# Patient Record
Sex: Female | Born: 1985 | Race: White | Hispanic: No | Smoking: Never smoker
Health system: Southern US, Community
[De-identification: ages and names within clinical notes are randomized; demographics above are authoritative.]

## PROBLEM LIST (undated history)

## (undated) ENCOUNTER — Inpatient Hospital Stay: Payer: Self-pay

## (undated) DIAGNOSIS — R51 Headache: Secondary | ICD-10-CM

## (undated) DIAGNOSIS — G43909 Migraine, unspecified, not intractable, without status migrainosus: Secondary | ICD-10-CM

## (undated) DIAGNOSIS — R519 Headache, unspecified: Secondary | ICD-10-CM

## (undated) DIAGNOSIS — Z87442 Personal history of urinary calculi: Secondary | ICD-10-CM

## (undated) DIAGNOSIS — N12 Tubulo-interstitial nephritis, not specified as acute or chronic: Secondary | ICD-10-CM

## (undated) DIAGNOSIS — N200 Calculus of kidney: Secondary | ICD-10-CM

## (undated) HISTORY — PX: COLPOSCOPY: SHX161

## (undated) HISTORY — PX: LEEP: SHX91

## (undated) HISTORY — DX: Migraine, unspecified, not intractable, without status migrainosus: G43.909

## (undated) HISTORY — DX: Tubulo-interstitial nephritis, not specified as acute or chronic: N12

---
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
## 2009-08-21 ENCOUNTER — Emergency Department: Payer: Self-pay | Admitting: Emergency Medicine

## 2010-04-13 ENCOUNTER — Observation Stay: Payer: Self-pay

## 2010-05-02 ENCOUNTER — Inpatient Hospital Stay: Payer: Self-pay | Admitting: Obstetrics and Gynecology

## 2015-11-17 LAB — HM HIV SCREENING LAB: HM HIV Screening: NEGATIVE

## 2016-03-08 LAB — HM PAP SMEAR: HM Pap smear: NEGATIVE

## 2016-04-16 ENCOUNTER — Emergency Department: Payer: Medicaid Other

## 2016-04-16 ENCOUNTER — Encounter: Payer: Self-pay | Admitting: Emergency Medicine

## 2016-04-16 ENCOUNTER — Inpatient Hospital Stay
Admission: EM | Admit: 2016-04-16 | Discharge: 2016-04-19 | DRG: 872 | Disposition: A | Discharge status: Home / Self Care | Payer: Medicaid Other | Attending: Internal Medicine | Admitting: Internal Medicine

## 2016-04-16 DIAGNOSIS — R05 Cough: Secondary | ICD-10-CM | POA: Diagnosis present

## 2016-04-16 DIAGNOSIS — N10 Acute tubulo-interstitial nephritis: Secondary | ICD-10-CM | POA: Diagnosis present

## 2016-04-16 DIAGNOSIS — B9620 Unspecified Escherichia coli [E. coli] as the cause of diseases classified elsewhere: Secondary | ICD-10-CM | POA: Diagnosis present

## 2016-04-16 DIAGNOSIS — J811 Chronic pulmonary edema: Secondary | ICD-10-CM | POA: Diagnosis present

## 2016-04-16 DIAGNOSIS — E876 Hypokalemia: Secondary | ICD-10-CM | POA: Diagnosis present

## 2016-04-16 DIAGNOSIS — A419 Sepsis, unspecified organism: Principal | ICD-10-CM | POA: Diagnosis present

## 2016-04-16 DIAGNOSIS — I959 Hypotension, unspecified: Secondary | ICD-10-CM | POA: Diagnosis present

## 2016-04-16 DIAGNOSIS — R319 Hematuria, unspecified: Secondary | ICD-10-CM | POA: Diagnosis present

## 2016-04-16 DIAGNOSIS — N390 Urinary tract infection, site not specified: Secondary | ICD-10-CM

## 2016-04-16 DIAGNOSIS — N12 Tubulo-interstitial nephritis, not specified as acute or chronic: Secondary | ICD-10-CM | POA: Diagnosis present

## 2016-04-16 DIAGNOSIS — R059 Cough, unspecified: Secondary | ICD-10-CM

## 2016-04-16 LAB — CBC WITH DIFFERENTIAL/PLATELET
BASOS ABS: 0 10*3/uL (ref 0–0.1)
Basophils Relative: 0 %
Eosinophils Absolute: 0 10*3/uL (ref 0–0.7)
Eosinophils Relative: 0 %
HEMATOCRIT: 40.6 % (ref 35.0–47.0)
Hemoglobin: 13.9 g/dL (ref 12.0–16.0)
LYMPHS PCT: 7 %
Lymphs Abs: 1.1 10*3/uL (ref 1.0–3.6)
MCH: 30 pg (ref 26.0–34.0)
MCHC: 34.2 g/dL (ref 32.0–36.0)
MCV: 87.8 fL (ref 80.0–100.0)
Monocytes Absolute: 1.4 10*3/uL — ABNORMAL HIGH (ref 0.2–0.9)
Monocytes Relative: 8 %
NEUTROS ABS: 14.4 10*3/uL — AB (ref 1.4–6.5)
NEUTROS PCT: 85 %
PLATELETS: 230 10*3/uL (ref 150–440)
RBC: 4.63 MIL/uL (ref 3.80–5.20)
RDW: 12.3 % (ref 11.5–14.5)
WBC: 17 10*3/uL — AB (ref 3.6–11.0)

## 2016-04-16 LAB — URINALYSIS COMPLETE WITH MICROSCOPIC (ARMC ONLY)
Bilirubin Urine: NEGATIVE
Glucose, UA: NEGATIVE mg/dL
KETONES UR: NEGATIVE mg/dL
Nitrite: NEGATIVE
PH: 5 (ref 5.0–8.0)
PROTEIN: 100 mg/dL — AB
Specific Gravity, Urine: 1.021 (ref 1.005–1.030)

## 2016-04-16 LAB — HCG, QUANTITATIVE, PREGNANCY: hCG, Beta Chain, Quant, S: 2 m[IU]/mL (ref <5)

## 2016-04-16 LAB — COMPREHENSIVE METABOLIC PANEL
ALT: 13 U/L — AB (ref 14–54)
ANION GAP: 9 (ref 5–15)
AST: 15 U/L (ref 15–41)
Albumin: 3.9 g/dL (ref 3.5–5.0)
Alkaline Phosphatase: 73 U/L (ref 38–126)
BUN: 9 mg/dL (ref 6–20)
CHLORIDE: 102 mmol/L (ref 101–111)
CO2: 23 mmol/L (ref 22–32)
Calcium: 8.9 mg/dL (ref 8.9–10.3)
Creatinine, Ser: 0.92 mg/dL (ref 0.44–1.00)
GFR calc non Af Amer: >60 mL/min (ref >60)
Glucose, Bld: 123 mg/dL — ABNORMAL HIGH (ref 65–99)
Potassium: 3 mmol/L — ABNORMAL LOW (ref 3.5–5.1)
SODIUM: 134 mmol/L — AB (ref 135–145)
Total Bilirubin: 1.2 mg/dL (ref 0.3–1.2)
Total Protein: 8 g/dL (ref 6.5–8.1)

## 2016-04-16 LAB — LACTIC ACID, PLASMA: LACTIC ACID, VENOUS: 1.4 mmol/L (ref 0.5–1.9)

## 2016-04-16 MED ORDER — MAGNESIUM CITRATE PO SOLN: 1.0000 | Freq: Once | ORAL | Status: DC | PRN

## 2016-04-16 MED ORDER — ACETAMINOPHEN 650 MG RE SUPP
650.0000 mg | Freq: Four times a day (QID) | RECTAL | Status: DC | PRN
  Administered 2016-04-16: 650 mg via RECTAL
  Filled 2016-04-16: qty 1

## 2016-04-16 MED ORDER — HYDROCODONE-ACETAMINOPHEN 5-325 MG PO TABS: 1.0000 | ORAL_TABLET | ORAL | Status: DC | PRN

## 2016-04-16 MED ORDER — RIZATRIPTAN BENZOATE 10 MG PO TABS: 10.0000 mg | ORAL_TABLET | ORAL | Status: DC | PRN

## 2016-04-16 MED ORDER — DEXTROSE 5 % IV SOLN
2.0000 g | INTRAVENOUS | Status: DC
  Administered 2016-04-17 – 2016-04-18 (×2): 2 g via INTRAVENOUS
  Filled 2016-04-16 (×3): qty 2

## 2016-04-16 MED ORDER — ACETAMINOPHEN 325 MG PO TABS
650.0000 mg | ORAL_TABLET | Freq: Four times a day (QID) | ORAL | Status: DC | PRN
  Filled 2016-04-16: qty 2

## 2016-04-16 MED ORDER — SODIUM CHLORIDE 0.9 % IV SOLN
INTRAVENOUS | Status: DC
  Administered 2016-04-16 – 2016-04-18 (×3): via INTRAVENOUS

## 2016-04-16 MED ORDER — SODIUM CHLORIDE 0.9 % IV BOLUS (SEPSIS)
1000.0000 mL | Freq: Once | INTRAVENOUS | Status: AC
  Administered 2016-04-16: 1000 mL via INTRAVENOUS

## 2016-04-16 MED ORDER — ONDANSETRON HCL 4 MG/2ML IJ SOLN
INTRAMUSCULAR | Status: AC
Start: 2016-04-16 — End: 2016-04-16
  Administered 2016-04-16: 4 mg via INTRAVENOUS
  Filled 2016-04-16: qty 2

## 2016-04-16 MED ORDER — POTASSIUM CHLORIDE 20 MEQ PO PACK
40.0000 meq | PACK | Freq: Two times a day (BID) | ORAL | Status: AC
  Filled 2016-04-16: qty 2

## 2016-04-16 MED ORDER — VANCOMYCIN HCL IN DEXTROSE 1-5 GM/200ML-% IV SOLN: 1000.0000 mg | Freq: Two times a day (BID) | INTRAVENOUS | Status: DC

## 2016-04-16 MED ORDER — ONDANSETRON HCL 4 MG PO TABS: 4.0000 mg | ORAL_TABLET | Freq: Four times a day (QID) | ORAL | Status: DC | PRN

## 2016-04-16 MED ORDER — PIPERACILLIN-TAZOBACTAM 3.375 G IVPB: 3.3750 g | Freq: Three times a day (TID) | INTRAVENOUS | Status: DC

## 2016-04-16 MED ORDER — MORPHINE SULFATE (PF) 2 MG/ML IV SOLN
INTRAVENOUS | Status: AC
  Administered 2016-04-16: 2 mg via INTRAVENOUS
  Filled 2016-04-16: qty 1

## 2016-04-16 MED ORDER — PIPERACILLIN-TAZOBACTAM 3.375 G IVPB 30 MIN
3.3750 g | Freq: Once | INTRAVENOUS | Status: AC
  Administered 2016-04-16: 3.375 g via INTRAVENOUS
  Filled 2016-04-16: qty 50

## 2016-04-16 MED ORDER — ONDANSETRON HCL 4 MG/2ML IJ SOLN
4.0000 mg | Freq: Four times a day (QID) | INTRAMUSCULAR | Status: DC | PRN
  Administered 2016-04-17 – 2016-04-19 (×4): 4 mg via INTRAVENOUS
  Filled 2016-04-16 (×4): qty 2

## 2016-04-16 MED ORDER — MORPHINE SULFATE (PF) 2 MG/ML IV SOLN
2.0000 mg | Freq: Once | INTRAVENOUS | Status: AC
  Administered 2016-04-16: 2 mg via INTRAVENOUS

## 2016-04-16 MED ORDER — SENNOSIDES-DOCUSATE SODIUM 8.6-50 MG PO TABS: 1.0000 | ORAL_TABLET | Freq: Every evening | ORAL | Status: DC | PRN

## 2016-04-16 MED ORDER — RIZATRIPTAN BENZOATE 10 MG PO TBDP: 10.0000 mg | ORAL_TABLET | ORAL | Status: DC | PRN

## 2016-04-16 MED ORDER — BISACODYL 5 MG PO TBEC: 5.0000 mg | DELAYED_RELEASE_TABLET | Freq: Every day | ORAL | Status: DC | PRN

## 2016-04-16 MED ORDER — DEXTROSE 5 % IV SOLN
2.0000 g | Freq: Once | INTRAVENOUS | Status: AC
  Administered 2016-04-17: 2 g via INTRAVENOUS
  Filled 2016-04-16: qty 2

## 2016-04-16 MED ORDER — VANCOMYCIN HCL IN DEXTROSE 1-5 GM/200ML-% IV SOLN
1000.0000 mg | Freq: Once | INTRAVENOUS | Status: AC
  Administered 2016-04-16: 1000 mg via INTRAVENOUS
  Filled 2016-04-16: qty 200

## 2016-04-16 MED ORDER — ONDANSETRON HCL 4 MG/2ML IJ SOLN
4.0000 mg | Freq: Once | INTRAMUSCULAR | Status: AC
  Administered 2016-04-16: 4 mg via INTRAVENOUS

## 2016-04-16 NOTE — ED Triage Notes (Signed)
Pt presents with low back pain, fever, bodyaches and headache for two days.

## 2016-04-16 NOTE — ED Notes (Signed)
Attempted reports x1.  

## 2016-04-16 NOTE — H&P (Signed)
[facility] [place] [facility] Admission History and Physical [employer] Alaisa Moffitt, D.O.  ---------------------------------------------------------------------------------------------------------------------   PATIENT NAME: Laura Sosa MR#: [id number] DATE OF BIRTH: 06/05/1986 DATE OF ADMISSION: 04/16/2016 PRIMARY CARE PHYSICIAN: Pcp Not In System  REQUESTING/REFERRING PHYSICIAN: ED Dr. Shaune PollackLord  CHIEF COMPLAINT: Chief Complaint  Patient presents with  . bodyaches  . Fever  . Emesis  . Back Pain    HISTORY OF PRESENT ILLNESS: Laura Sosa is a 30 y.o. female with No known medical history was in a usual state of health until 3 days ago when she developed fevers, chills, body aches and right sided mid to low back pain. She reports that for the last 2-3 days she has had vomiting for which she received an intramuscular injection of Phenergan earlier in the day without any resolution of her symptoms. She came to the emergency department for evaluation secondary to worsening low back pain, decreased by mouth intake, body aches, fever and back pain. She denies any abdominal pain or dysuria. She did have one episode of hematuria in the emergency department today.  Otherwise there has been no change in status. Patient has been taking medication as prescribed and there has been no recent change in medication or diet.  There has been no recent illness, travel or sick contacts.    Patient denies fevers/chills, weakness, dizziness, chest pain, shortness of breath, N/V/C/D, abdominal pain, dysuria/frequency, changes in mental status.   EMS/ED COURSE:   Patient received Zosyn, Vanco and IV normal saline  PAST MEDICAL HISTORY: History reviewed. No pertinent past medical history.    PAST SURGICAL HISTORY: History reviewed. No pertinent surgical history.    SOCIAL HISTORY: Social History  Substance Use Topics  . Smoking status: Never Smoker  . Smokeless tobacco: Never Used  .  Alcohol use No   Patient is currently in school for dental hygiene   FAMILY HISTORY: No family history on file.   MEDICATIONS AT HOME: Prior to Admission medications   Medication Sig Start Date End Date Taking? Authorizing Provider  rizatriptan (MAXALT-MLT) 10 MG disintegrating tablet Take 10 mg by mouth as needed for migraine. May repeat in 2 hours if needed   Yes Historical Provider, MD      DRUG ALLERGIES: No Known Allergies   REVIEW OF SYSTEMS: CONSTITUTIONAL: Positive fever/chills, fatigue, negative weakness, weight gain/loss, headache EYES: No blurry or double vision. ENT: No tinnitus, postnasal drip, redness or soreness of the oropharynx. RESPIRATORY: No cough, wheeze, hemoptysis, dyspnea. CARDIOVASCULAR: No chest pain, orthopnea, palpitations, syncope. GASTROINTESTINAL: No nausea, vomiting, constipation, diarrhea, abdominal pain, hematemesis, melena or hematochezia. GENITOURINARY: Negative dysuria positive hematuria. ENDOCRINE: No polyuria or nocturia. No heat or cold intolerance. HEMATOLOGY: No anemia, bruising, bleeding. INTEGUMENTARY: No rashes, ulcers, lesions. MUSCULOSKELETAL: No arthritis, swelling, gout. NEUROLOGIC: No numbness, tingling, weakness or ataxia. No seizure-type activity. PSYCHIATRIC: No anxiety, depression, insomnia.  PHYSICAL EXAMINATION: VITAL SIGNS: Blood pressure 103/86, pulse 98, temperature (!) 102.5 F (39.2 C), temperature source Oral, resp. rate 18, height 5\' 3"  (1.6 m), weight 65.8 kg (145 lb), last menstrual period 04/09/2016, SpO2 98 %, unknown if currently breastfeeding.  GENERAL: 30 y.o.-year-old white female patient, well-developed, well-nourished lying in the bed in no acute distress.  Pleasant and cooperative.   HEENT: Head atraumatic, normocephalic. Pupils equal, round, reactive to light and accommodation. No scleral icterus. Extraocular muscles intact. Nares are patent. Oropharynx is clear. Mucus membranes moist. NECK: Supple,  full range of motion. No JVD, no bruit heard. No thyroid enlargement, no tenderness,  no cervical lymphadenopathy. CHEST: Normal breath sounds bilaterally. No wheezing, rales, rhonchi or crackles. No use of accessory muscles of respiration.  No reproducible chest wall tenderness.  CARDIOVASCULAR: S1, S2 normal. No murmurs, rubs, or gallops. Cap refill <2 seconds. ABDOMEN: Soft, nontender, nondistended. No rebound, guarding, rigidity. Normoactive bowel sounds present in all four quadrants. No organomegaly or mass. GU positive right-sided CVA tenderness:  EXTREMITIES: Full range of motion. No pedal edema, cyanosis, or clubbing. NEUROLOGIC: Cranial nerves II through XII are grossly intact with no focal sensorimotor deficit. Muscle strength 5/5 in all extremities. Sensation intact. Gait not checked. PSYCHIATRIC: The patient is alert and oriented x 3. Normal affect, mood, thought content. SKIN: Warm, dry, and intact without obvious rash, lesion, or ulcer.  LABORATORY PANEL:  CBC  Recent Labs Lab 04/16/16 1851  WBC 17.0*  HGB 13.9  HCT 40.6  PLT 230   ----------------------------------------------------------------------------------------------------------------- Chemistries  Recent Labs Lab 04/16/16 1851  NA 134*  K 3.0*  CL 102  CO2 23  GLUCOSE 123*  BUN 9  CREATININE 0.92  CALCIUM 8.9  AST 15  ALT 13*  ALKPHOS 73  BILITOT 1.2   ------------------------------------------------------------------------------------------------------------------ Cardiac Enzymes No results for input(s): TROPONINI in the last 168 hours. ------------------------------------------------------------------------------------------------------------------  RADIOLOGY: Dg Chest 2 View  Result Date: 04/16/2016 CLINICAL DATA:  Fever, vomiting, nausea, chest pain, cough, myalgias, chills. EXAM: CHEST  2 VIEW COMPARISON:  None. FINDINGS: Normal sized heart. Clear lungs. Mild central peribronchial  thickening. Mild scoliosis. IMPRESSION: Mild bronchitic changes. Electronically Signed   By: Beckie SaltsSteven  Reid M.D.   On: 04/16/2016 20:30    EKG: Sinus tachycardia at 103 bpm with a normal axis and no significant ST or T-wave changes.   IMPRESSION AND PLAN:  This is a 30 y.o. female with no known past medical history  now being admitted with: 1. Sepsis secondary to urinary tract infection suspect pyelonephritis-we will admit to med-surg for IV fluids, IV antibiotics which we will narrow to Rocephin, urine cultures, pain control and antiemetics. We will request a renal ultrasound to evaluate for stones given her hematuria and right-sided back pain. 2. Hypokalemia-Will replace by mouth.   Diet/Nutrition: Regular, clear liquids if nausea persistsds:  Fluids: IV normal saline  DVT Px: SCDs and early ambulation Code Status: Full  All the records are reviewed and case discussed with ED provider. Management plans discussed with the patient and/or family who express understanding and agree with plan of care.   TOTAL TIME TAKING CARE OF THIS PATIENT: 60 minutes.   Finlee Milo D.O. on 04/16/2016 at 10:53 PM Between 7am to 6pm - Pager - [phone] After 6pm go to [email or web address] - Social research officer, governmentpassword EPAS [facility] [place] [facility] Office [phone] CC: Primary care physician; Pcp Not In System     Note: This dictation was prepared with Dragon dictation along with smaller phrase technology. Any transcriptional errors that result from this process are unintentional.

## 2016-04-16 NOTE — ED Provider Notes (Signed)
[facility] Emergency Department Provider Note ____________________________________________   I have reviewed the triage vital signs and the triage nursing note.  HISTORY  Chief Complaint bodyaches; Fever; Emesis; and Back Pain   Historian Patient  HPI Laura Sosa is a 30 y.o. female who is here for evaluation of fever and body aches and back pain. Symptoms started 2-3 days ago and have gotten worse over the weekend. Denies dysuria. Denies abdominal pain. She has had multiple episodes of nonbloody nonbilious emesis. Her mom works at a physician's office and she received intramuscular injection of Phenergan earlier today with some resolution of nausea. No skin rashes. No spinal injections or epidural, etc. No confusion altered mental status or neck stiffness. No coughing or trouble breathing. No chest pain.  Body aches and back painrated at 8 or 9 out of 10.    History reviewed. No pertinent past medical history.  There are no active problems to display for this patient.   History reviewed. No pertinent surgical history.  Prior to Admission medications   Medication Sig Start Date End Date Taking? Authorizing Provider  rizatriptan (MAXALT-MLT) 10 MG disintegrating tablet Take 10 mg by mouth as needed for migraine. May repeat in 2 hours if needed   Yes Historical Provider, MD    No Known Allergies  No family history on file.  Social History Social History  Substance Use Topics  . Smoking status: Never Smoker  . Smokeless tobacco: Never Used  . Alcohol use No    Review of Systems  Constitutional: Positive for fever. Eyes: Negative for visual changes. ENT: Negative for sore throat. Cardiovascular: Negative for chest pain. Respiratory: Negative for shortness of breath. Gastrointestinal: Negative for abdominal pain, vomiting and diarrhea. Genitourinary: Negative for dysuria. Denies pelvic pain, or vaginal discharge. Musculoskeletal:  Positive for low back pain.  Skin: Negative for rash. Neurological: Negative for headache. 10 point Review of Systems otherwise negative ____________________________________________   PHYSICAL EXAM:  VITAL SIGNS: ED Triage Vitals  Enc Vitals Group     BP 04/16/16 1822 118/73     Pulse Rate 04/16/16 1822 (!) 122     Resp 04/16/16 1822 19     Temp 04/16/16 1822 (!) 102.3 F (39.1 C)     Temp Source 04/16/16 1822 Oral     SpO2 04/16/16 1822 98 %     Weight 04/16/16 1821 145 lb (65.8 kg)     Height 04/16/16 1821 5\' 3"  (1.6 m)     Head Circumference --      Peak Flow --      Pain Score 04/16/16 1829 10     Pain Loc --      Pain Edu? --      Excl. in GC? --      Constitutional: Alert and oriented. Looks like she doesn't feel very well, but in no acute distress. HEENT   Head: Normocephalic and atraumatic.      Eyes: Conjunctivae are normal. PERRL. Normal extraocular movements.      Ears:         Nose: No congestion/rhinnorhea.   Mouth/Throat: Mucous membranes are mildly dry.   Neck: No stridor. Cardiovascular/Chest: Tachycardic, regular rhythm.  No murmurs, rubs, or gallops. Respiratory: Normal respiratory effort without tachypnea nor retractions. Breath sounds are clear and equal bilaterally. No wheezes/rales/rhonchi. Gastrointestinal: Soft. No distention, no guarding, no rebound. Nontender.    Genitourinary/rectal:Deferred Musculoskeletal: Nontender with normal range of motion in all extremities. No joint effusions.  No lower  extremity tenderness.  No edema. Neurologic:  Normal speech and language. No gross or focal neurologic deficits are appreciated. Skin:  Skin is warm, dry and intact. No rash noted. Psychiatric: Mood and affect are normal. Speech and behavior are normal. Patient exhibits appropriate insight and judgment.  ____________________________________________   EKG I, Governor Rooksebecca Manjot Beumer, MD, the attending physician have personally viewed and interpreted all  ECGs.  103 bpm. Sinus tachycardia. Narrow QRS. Normal axis. Normal ST and T-wave. ____________________________________________  LABS (pertinent positives/negatives)  Labs Reviewed  COMPREHENSIVE METABOLIC PANEL - Abnormal; Notable for the following:       Result Value   Sodium 134 (*)    Potassium 3.0 (*)    Glucose, Bld 123 (*)    ALT 13 (*)    All other components within normal limits  CBC WITH DIFFERENTIAL/PLATELET - Abnormal; Notable for the following:    WBC 17.0 (*)    Neutro Abs 14.4 (*)    Monocytes Absolute 1.4 (*)    All other components within normal limits  URINALYSIS COMPLETEWITH MICROSCOPIC ([facility] ONLY) - Abnormal; Notable for the following:    Color, Urine AMBER (*)    APPearance CLOUDY (*)    Hgb urine dipstick 2+ (*)    Protein, ur 100 (*)    Leukocytes, UA 1+ (*)    Bacteria, UA MANY (*)    Squamous Epithelial / LPF 6-30 (*)    All other components within normal limits  CULTURE, BLOOD (ROUTINE X 2)  CULTURE, BLOOD (ROUTINE X 2)  URINE CULTURE  LACTIC ACID, PLASMA  HCG, QUANTITATIVE, PREGNANCY  LACTIC ACID, PLASMA    ____________________________________________  RADIOLOGY All Xrays were viewed by me. Imaging interpreted by Radiologist.  Chest x-ray: Mild bronchitic changes __________________________________________  PROCEDURES  Procedure(s) performed: None  Critical Care performed: CRITICAL CARE Performed by: Governor RooksLORD, Khadeja Abt   Total critical care time: 30 minutes  Critical care time was exclusive of separately billable procedures and treating other patients.  Critical care was necessary to treat or prevent imminent or life-threatening deterioration.  Critical care was time spent personally by me on the following activities: development of treatment plan with patient and/or surrogate as well as nursing, discussions with consultants, evaluation of patient's response to treatment, examination of patient, obtaining history from patient or  surrogate, ordering and performing treatments and interventions, ordering and review of laboratory studies, ordering and review of radiographic studies, pulse oximetry and re-evaluation of patient's condition.   ____________________________________________   ED COURSE / ASSESSMENT AND PLAN  Pertinent labs & imaging results that were available during my care of the patient were reviewed by me and considered in my medical decision making (see chart for details).   Laura Sosa is here with fever and tachycardia and body aches concerning for possible sepsis. Code sepsis was initiated. Unclear source based on history and physical exam, the patient's complaint is low back pain.  I am going to initiate antibiotics ankle mice and Zosyn for undifferentiated sepsis prior to laboratory studies returned.  White blood cell count 17,000. Patient is slightly hypokalemic and was given repletion. Her urinalysis is consistent with urinary tract infection and given symptoms, I think pyelonephritis is the source of her symptoms and sepsis.  Discussed with the hospitalist for admission.  CONSULTATIONS:   Dr. Clint GuyHower, hospitalist for admission.   Patient / Family / Caregiver informed of clinical course, medical decision-making process, and agree with plan.    ___________________________________________   FINAL CLINICAL IMPRESSION(S) / ED DIAGNOSES  Final diagnoses:  Sepsis, unspecified organism [facility](HCC)  Pyelonephritis              Note: This dictation was prepared with Dragon dictation. Any transcriptional errors that result from this process are unintentional    Governor Rooksebecca Christe Tellez, MD 04/16/16 2039

## 2016-04-16 NOTE — Progress Notes (Addendum)
Pharmacy Antibiotic Note  Laura Sosa is a 30 y.o. female admitted on 04/16/2016 with sepsis.  Pharmacy has been consulted for Vancomycin/Zosyn dosing.  Plan: Vancomycin 1000mg   IV every 12 hours.  Goal trough 15-20 mcg/mL. Zosyn 3.375g IV q8h (4 hour infusion). Stacked dose 6 hr from ED dose. Will order a vancomycin trough prior to the fifth dose. Continue to monitor CrCl.   Ke=0.067 Vd=46.06 T1/2= 10.34 Estimated trough= 18.96   Height: 5\' 3"  (160 cm) Weight: 145 lb (65.8 kg) IBW/kg (Calculated) : 52.4  Temp (24hrs), Avg:103 F (39.4 C), Min:102.3 F (39.1 C), Max:103.7 F (39.8 C)  No results for input(s): WBC, CREATININE, LATICACIDVEN, VANCOTROUGH, VANCOPEAK, VANCORANDOM, GENTTROUGH, GENTPEAK, GENTRANDOM, TOBRATROUGH, TOBRAPEAK, TOBRARND, AMIKACINPEAK, AMIKACINTROU, AMIKACIN in the last 168 hours.  CrCl cannot be calculated (No order found.).    No Known Allergies  Antimicrobials this admission: Vancomycin 8/21 >> x1 Zosyn 8/21 >> Ceftriaxone   Microbiology results: 8/21 BCx:  8/21 UCx:    Changed to ceftriaxone per admitting MD note. Consults for Zosyn and vancomycin d/c.  Thank you for allowing pharmacy to be a part of this patient's care.  Delsa BernKelly m Fuhrmann 04/16/2016 7:08 PM

## 2016-04-16 NOTE — ED Notes (Signed)
Pt had childrens tyenol around 1pm today.

## 2016-04-17 ENCOUNTER — Observation Stay: Payer: Medicaid Other

## 2016-04-17 DIAGNOSIS — N12 Tubulo-interstitial nephritis, not specified as acute or chronic: Secondary | ICD-10-CM

## 2016-04-17 DIAGNOSIS — E876 Hypokalemia: Secondary | ICD-10-CM | POA: Diagnosis present

## 2016-04-17 DIAGNOSIS — N10 Acute tubulo-interstitial nephritis: Secondary | ICD-10-CM | POA: Diagnosis present

## 2016-04-17 DIAGNOSIS — R05 Cough: Secondary | ICD-10-CM | POA: Diagnosis present

## 2016-04-17 DIAGNOSIS — J811 Chronic pulmonary edema: Secondary | ICD-10-CM | POA: Diagnosis present

## 2016-04-17 DIAGNOSIS — A419 Sepsis, unspecified organism: Secondary | ICD-10-CM | POA: Diagnosis present

## 2016-04-17 DIAGNOSIS — I959 Hypotension, unspecified: Secondary | ICD-10-CM | POA: Diagnosis present

## 2016-04-17 DIAGNOSIS — R319 Hematuria, unspecified: Secondary | ICD-10-CM | POA: Diagnosis present

## 2016-04-17 DIAGNOSIS — B9620 Unspecified Escherichia coli [E. coli] as the cause of diseases classified elsewhere: Secondary | ICD-10-CM | POA: Diagnosis present

## 2016-04-17 HISTORY — DX: Tubulo-interstitial nephritis, not specified as acute or chronic: N12

## 2016-04-17 LAB — CBC
HEMATOCRIT: 33.9 % — AB (ref 35.0–47.0)
Hemoglobin: 12 g/dL (ref 12.0–16.0)
MCH: 30.8 pg (ref 26.0–34.0)
MCHC: 35.3 g/dL (ref 32.0–36.0)
MCV: 87.4 fL (ref 80.0–100.0)
Platelets: 188 10*3/uL (ref 150–440)
RBC: 3.88 MIL/uL (ref 3.80–5.20)
RDW: 12.4 % (ref 11.5–14.5)
WBC: 12.1 10*3/uL — AB (ref 3.6–11.0)

## 2016-04-17 LAB — BASIC METABOLIC PANEL
Anion gap: 7 (ref 5–15)
BUN: 8 mg/dL (ref 6–20)
CHLORIDE: 109 mmol/L (ref 101–111)
CO2: 20 mmol/L — AB (ref 22–32)
Calcium: 7.7 mg/dL — ABNORMAL LOW (ref 8.9–10.3)
Creatinine, Ser: 0.86 mg/dL (ref 0.44–1.00)
GFR calc Af Amer: >60 mL/min (ref >60)
GFR calc non Af Amer: >60 mL/min (ref >60)
GLUCOSE: 160 mg/dL — AB (ref 65–99)
POTASSIUM: 3.1 mmol/L — AB (ref 3.5–5.1)
Sodium: 136 mmol/L (ref 135–145)

## 2016-04-17 LAB — MAGNESIUM: Magnesium: 1.6 mg/dL — ABNORMAL LOW (ref 1.7–2.4)

## 2016-04-17 MED ORDER — SODIUM CHLORIDE 0.9 % IV BOLUS (SEPSIS)
1000.0000 mL | Freq: Once | INTRAVENOUS | Status: AC
  Administered 2016-04-17: 1000 mL via INTRAVENOUS

## 2016-04-17 MED ORDER — HYDROCODONE-ACETAMINOPHEN 7.5-325 MG/15ML PO SOLN
10.0000 mL | ORAL | Status: DC | PRN
  Administered 2016-04-17 (×2): 10 mL via ORAL
  Filled 2016-04-17 (×2): qty 15

## 2016-04-17 MED ORDER — POTASSIUM CHLORIDE 20 MEQ PO PACK
40.0000 meq | PACK | Freq: Once | ORAL | Status: AC
  Administered 2016-04-17: 40 meq via ORAL
  Filled 2016-04-17: qty 2

## 2016-04-17 MED ORDER — HYDROCODONE-ACETAMINOPHEN 7.5-325 MG/15ML PO SOLN
15.0000 mL | ORAL | Status: DC | PRN
  Administered 2016-04-17 – 2016-04-19 (×10): 15 mL via ORAL
  Filled 2016-04-17 (×10): qty 15

## 2016-04-17 MED ORDER — ZOLPIDEM TARTRATE 5 MG PO TABS
5.0000 mg | ORAL_TABLET | Freq: Every evening | ORAL | Status: DC | PRN
  Administered 2016-04-17: 5 mg via ORAL
  Filled 2016-04-17: qty 1

## 2016-04-17 NOTE — Progress Notes (Signed)
Pt requested and received pain medication at 1830 for pain rated 7/10.

## 2016-04-17 NOTE — Progress Notes (Signed)
Pharmacy Antibiotic Note  Laura Sosa is a 30 y.o. female admitted on 04/16/2016 with UTI.  Pharmacy has been consulted for ceftriaxone dosing.  Plan: Ceftriaxone 2 g IV q24h  Height: 5\' 3"  (160 cm) Weight: 149 lb 6.4 oz (67.8 kg) IBW/kg (Calculated) : 52.4  Temp (24hrs), Avg:100.5 F (38.1 C), Min:98.1 F (36.7 C), Max:103.7 F (39.8 C)   Recent Labs Lab 04/16/16 1851 04/16/16 1852 04/17/16 0439  WBC 17.0*  --  12.1*  CREATININE 0.92  --  0.86  LATICACIDVEN  --  1.4  --     Estimated Creatinine Clearance: 90.9 mL/min (by C-G formula based on SCr of 0.86 mg/dL).    No Known Allergies  Antimicrobials this admission: ceftriaxone 8/21 >>  Vancomycin and zosyn one dose in ED 8/21  Dose adjustments this admission:  Microbiology results: 8/21 BCx: Sent 8/21 UCx: Sent    Thank you for allowing pharmacy to be a part of this patient's care.  Cindi CarbonMary M Grabiela Wohlford, PharmD, BCPS 04/17/2016 10:37 AM

## 2016-04-17 NOTE — Progress Notes (Signed)
[facility] [place] [facility]   PATIENT NAME: Laura Sosa    MR#:  [id number]  DATE OF BIRTH:  03/12/1986  SUBJECTIVE:  CHIEF COMPLAINT:   Chief Complaint  Patient presents with  . bodyaches  . Fever  . Emesis  . Back Pain   Continues to be febrile and hypotensive. Hematuria is mildly improved. Febrile. Continues to have back pain.  REVIEW OF SYSTEMS:    Review of Systems  Constitutional: Positive for chills, fever and malaise/fatigue.  HENT: Negative for sore throat.   Eyes: Negative for blurred vision, double vision and pain.  Respiratory: Negative for cough, hemoptysis, shortness of breath and wheezing.   Cardiovascular: Negative for chest pain, palpitations, orthopnea and leg swelling.  Gastrointestinal: Negative for abdominal pain, constipation, diarrhea, heartburn, nausea and vomiting.  Genitourinary: Positive for hematuria and urgency. Negative for dysuria.  Musculoskeletal: Positive for back pain. Negative for joint pain.  Skin: Negative for rash.  Neurological: Positive for weakness. Negative for sensory change, speech change, focal weakness and headaches.  Endo/Heme/Allergies: Does not bruise/bleed easily.  Psychiatric/Behavioral: Negative for depression. The patient is not nervous/anxious.     DRUG ALLERGIES:  No Known Allergies  VITALS:  Blood pressure (!) 96/59, pulse 76, temperature 98.7 F (37.1 C), temperature source Oral, resp. rate 19, height 5\' 3"  (1.6 m), weight 67.8 kg (149 lb 6.4 oz), last menstrual period 04/09/2016, SpO2 100 %, unknown if currently breastfeeding.  PHYSICAL EXAMINATION:   Physical Exam  GENERAL:  30 y.o.-year-old patient lying in the bed with no acute distress.  EYES: Pupils equal, round, reactive to light and accommodation. No scleral icterus. Extraocular muscles intact.  HEENT: Head atraumatic, normocephalic. Oropharynx and nasopharynx clear.  NECK:  Supple, no jugular venous distention.  No thyroid enlargement, no tenderness.  LUNGS: Normal breath sounds bilaterally, no wheezing, rales, rhonchi. No use of accessory muscles of respiration.  CARDIOVASCULAR: S1, S2 normal. No murmurs, rubs, or gallops.  ABDOMEN: Soft, nontender, nondistended. Bowel sounds present. No organomegaly or mass.  Bilateral CVA tenderness EXTREMITIES: No cyanosis, clubbing or edema b/l.    NEUROLOGIC: Cranial nerves II through XII are intact. No focal Motor or sensory deficits b/l.   PSYCHIATRIC: The patient is alert and oriented x 3.  SKIN: No obvious rash, lesion, or ulcer.   LABORATORY PANEL:   CBC  Recent Labs Lab 04/17/16 0439  WBC 12.1*  HGB 12.0  HCT 33.9*  PLT 188   ------------------------------------------------------------------------------------------------------------------ Chemistries   Recent Labs Lab 04/16/16 1851 04/17/16 0439  NA 134* 136  K 3.0* 3.1*  CL 102 109  CO2 23 20*  GLUCOSE 123* 160*  BUN 9 8  CREATININE 0.92 0.86  CALCIUM 8.9 7.7*  AST 15  --   ALT 13*  --   ALKPHOS 73  --   BILITOT 1.2  --    ------------------------------------------------------------------------------------------------------------------  Cardiac Enzymes No results for input(s): TROPONINI in the last 168 hours. ------------------------------------------------------------------------------------------------------------------  RADIOLOGY:  Dg Chest 2 View  Result Date: 04/16/2016 CLINICAL DATA:  Fever, vomiting, nausea, chest pain, cough, myalgias, chills. EXAM: CHEST  2 VIEW COMPARISON:  None. FINDINGS: Normal sized heart. Clear lungs. Mild central peribronchial thickening. Mild scoliosis. IMPRESSION: Mild bronchitic changes. Electronically Signed   By: Beckie SaltsSteven  Reid M.D.   On: 04/16/2016 20:30   [place] Renal  Result Date: 04/17/2016 CLINICAL DATA:  UTI and sepsis EXAM: RENAL / URINARY TRACT ULTRASOUND COMPLETE COMPARISON:  None. FINDINGS: Right Kidney: Length: 11.6 cm. Echogenicity  within normal limits. No mass or hydronephrosis visualized. Left Kidney: Length: 11.0 cm. Echogenicity within normal limits. No mass or hydronephrosis visualized. Bladder: Appears normal for degree of bladder distention. IMPRESSION: No acute abnormality noted. Electronically Signed   By: Alcide CleverMark  Lukens M.D.   On: 04/17/2016 09:30     ASSESSMENT AND PLAN:   * Acute pyelonephritis with sepsis Patient continues to be hypotensive and febrile. We will bolus 1 L normal saline and continue maintenance fluids. Repeat blood pressure after fluids. Continue ceftriaxone. Blood and urine cultures pending. Renal ultrasound showed no hydronephrosis or obstruction.  * Hypokalemia Replace orally  * DVT prophylaxis with SCDs No heparin or Lovenox due to significant hematuria  All the records are reviewed and case discussed with Care Management/Social Workerr. Management plans discussed with the patient, family and they are in agreement.  CODE STATUS: FULL CODE  DVT Prophylaxis: SCDs  TOTAL CC TIME TAKING CARE OF THIS PATIENT: 35 minutes.   POSSIBLE D/C IN 2-3 DAYS, DEPENDING ON CLINICAL CONDITION.  Milagros LollSudini, Latosha Gaylord R M.D on 04/17/2016 at 12:10 PM  Between 7am to 6pm - Pager - [phone]  After 6pm go to [email or web address] - password [email or web address]  [facility]  Office  [phone]  CC: Primary care physician; Pcp Not In System  Note: This dictation was prepared with Dragon dictation along with smaller phrase technology. Any transcriptional errors that result from this process are unintentional.

## 2016-04-17 NOTE — Plan of Care (Signed)
Problem: Bowel/Gastric: Goal: Will not experience complications related to bowel motility Outcome: Progressing Pt is resting quietly in bed unless needing to to void, is independent with ambulation. Pain control has been attended to, and testing has been completed, cultures pending. Dr. Elpidio AnisSudini rounded on pt, pt is aware of plan to await cultures, continue ivf and antibiotics.

## 2016-04-17 NOTE — Progress Notes (Signed)
Shift assessment completed at 0730. Pt is awake, alert and oriented, stated that she is in pain to her back. Pt is on room air, lungs are clear bilat, monitor in place, Sr noted. Abdomen is soft, bs heard. Pt denied difficulty voiding,ppp, no edema noted. PIV #20 intact to lac, #22 intact to RAC with iv NS infusing at 14350mls/hr, both sites are free of redness and swelling. Skin appears intact. Directly after assessment completed, pt rang call bell ot c/o severe pain, pt moaning while this writer was in the room. This writer explained pain med could not be given until at 0830 due to time interval ordered, pt then stated she was nauseous and asked for med for this. Pt received zofran iv. Pt then asked for a sleeping pill tonight because "i haven't slept in 4 days". This Clinical research associatewriter explained that she could apeak to practitioner about this but that  She may not need  One by then. Pt received pain med at 0825. Pt has been to ultrasound fro testing and has returned to the unit.

## 2016-04-17 NOTE — Progress Notes (Signed)
Pt requested and received scd's at 1300. At 1430, pt requested and received med for pain to her back 10/10. Pt has had multiple family members at bedside since this am, tolerated some food for lunch. Iv bolus has nearly finished, pt's bp slowly improving, pt is stating that overall, she feels better.

## 2016-04-17 NOTE — Care Management Note (Signed)
Case Management Note  Patient Details  Name: Laura Sosa MRN: [id number] Date of Birth: 07/14/1986  Subjective/Objective:     Spoke with patient and family at the bedside.   Patient is A and O from home with family and independent. No CM needs identified.             Action/Plan:Home with self care.  Expected Discharge Date:                  Expected Discharge Plan:  Home/Self Care  In-House Referral:     Discharge planning Services  CM Consult  Post Acute Care Choice:    Choice offered to:     DME Arranged:    DME Agency:     HH Arranged:    HH Agency:     Status of Service:  Completed, signed off  If discussed at [employer] Length of Stay Meetings, dates discussed:    Additional Comments:  Adonis HugueninBerkhead, Melvin Whiteford L, RN 04/17/2016, 1:56 PM

## 2016-04-18 ENCOUNTER — Inpatient Hospital Stay: Payer: Medicaid Other

## 2016-04-18 LAB — BASIC METABOLIC PANEL
Anion gap: 5 (ref 5–15)
CHLORIDE: 111 mmol/L (ref 101–111)
CO2: 22 mmol/L (ref 22–32)
CREATININE: 0.71 mg/dL (ref 0.44–1.00)
Calcium: 8 mg/dL — ABNORMAL LOW (ref 8.9–10.3)
GFR calc Af Amer: >60 mL/min (ref >60)
Glucose, Bld: 94 mg/dL (ref 65–99)
Potassium: 3.2 mmol/L — ABNORMAL LOW (ref 3.5–5.1)
SODIUM: 138 mmol/L (ref 135–145)

## 2016-04-18 LAB — MAGNESIUM: MAGNESIUM: 1.7 mg/dL (ref 1.7–2.4)

## 2016-04-18 LAB — CBC WITH DIFFERENTIAL/PLATELET
BASOS ABS: 0.1 10*3/uL (ref 0–0.1)
BASOS PCT: 1 %
EOS ABS: 0.1 10*3/uL (ref 0–0.7)
Eosinophils Relative: 1 %
HCT: 32.4 % — ABNORMAL LOW (ref 35.0–47.0)
HEMOGLOBIN: 11.3 g/dL — AB (ref 12.0–16.0)
LYMPHS ABS: 2.2 10*3/uL (ref 1.0–3.6)
Lymphocytes Relative: 20 %
MCH: 30.9 pg (ref 26.0–34.0)
MCHC: 34.8 g/dL (ref 32.0–36.0)
MCV: 88.6 fL (ref 80.0–100.0)
Monocytes Absolute: 0.9 10*3/uL (ref 0.2–0.9)
Monocytes Relative: 8 %
NEUTROS PCT: 70 %
Neutro Abs: 7.9 10*3/uL — ABNORMAL HIGH (ref 1.4–6.5)
Platelets: 191 10*3/uL (ref 150–440)
RBC: 3.65 MIL/uL — AB (ref 3.80–5.20)
RDW: 12.5 % (ref 11.5–14.5)
WBC: 11.1 10*3/uL — AB (ref 3.6–11.0)

## 2016-04-18 MED ORDER — POTASSIUM CHLORIDE 20 MEQ PO PACK: 40.0000 meq | PACK | ORAL | Status: AC

## 2016-04-18 MED ORDER — FUROSEMIDE 10 MG/ML IJ SOLN
20.0000 mg | Freq: Once | INTRAMUSCULAR | Status: AC
  Administered 2016-04-18: 20 mg via INTRAVENOUS
  Filled 2016-04-18: qty 2

## 2016-04-18 MED ORDER — FUROSEMIDE 40 MG PO TABS: 40.0000 mg | ORAL_TABLET | Freq: Once | ORAL | Status: DC

## 2016-04-18 NOTE — Plan of Care (Signed)
Problem: Bowel/Gastric: Goal: Will not experience complications related to bowel motility Outcome: Progressing Pt has remains free on injury/falls this shift, is progressing with goals toward d/c. Pt has been cooperative with care, is anticipating d/c tomorrow.

## 2016-04-18 NOTE — Progress Notes (Signed)
Pt awake on rounds, stated that she has urinated x3 since receiving iv lasix. Measuring hat is replaced to commode. Pt in no distress.

## 2016-04-18 NOTE — Progress Notes (Addendum)
[facility] [place] [facility]   PATIENT NAME: Laura Sosa    MR#:  [id number]  DATE OF BIRTH:  08/03/1986  SUBJECTIVE:  CHIEF COMPLAINT:   Chief Complaint  Patient presents with  . bodyaches  . Fever  . Emesis  . Back Pain   Hematuria has resolved Afebrile today Continues to have back pain. Complaining of cough  REVIEW OF SYSTEMS:    Review of Systems  Constitutional: Positive for chills, fever and malaise/fatigue.  HENT: Negative for sore throat.   Eyes: Negative for blurred vision, double vision and pain.  Respiratory: Negative for cough, hemoptysis, shortness of breath and wheezing.   Cardiovascular: Negative for chest pain, palpitations, orthopnea and leg swelling.  Gastrointestinal: Negative for abdominal pain, constipation, diarrhea, heartburn, nausea and vomiting.  Genitourinary: Positive for hematuria and urgency. Negative for dysuria.  Musculoskeletal: Positive for back pain. Negative for joint pain.  Skin: Negative for rash.  Neurological: Positive for weakness. Negative for sensory change, speech change, focal weakness and headaches.  Endo/Heme/Allergies: Does not bruise/bleed easily.  Psychiatric/Behavioral: Negative for depression. The patient is not nervous/anxious.     DRUG ALLERGIES:  No Known Allergies  VITALS:  Blood pressure 111/66, pulse 81, temperature 97.7 F (36.5 C), temperature source Oral, resp. rate 16, height 5\' 3"  (1.6 m), weight 67.8 kg (149 lb 6.4 oz), last menstrual period 04/09/2016, SpO2 94 %, unknown if currently breastfeeding.  PHYSICAL EXAMINATION:   Physical Exam  GENERAL:  30 y.o.-year-old patient lying in the bed with no acute distress.  EYES: Pupils equal, round, reactive to light and accommodation. No scleral icterus. Extraocular muscles intact.  HEENT: Head atraumatic, normocephalic. Oropharynx and nasopharynx clear.  NECK:  Supple, no jugular venous distention. No thyroid enlargement,  no tenderness.  LUNGS: Normal work of breathing. Basal crackles. CARDIOVASCULAR: S1, S2 normal. No murmurs, rubs, or gallops.  ABDOMEN: Soft, nontender, nondistended. Bowel sounds present. No organomegaly or mass.  Bilateral CVA tenderness EXTREMITIES: No cyanosis, clubbing or edema b/l.    NEUROLOGIC: Cranial nerves II through XII are intact. No focal Motor or sensory deficits b/l.   PSYCHIATRIC: The patient is alert and oriented x 3.  SKIN: No obvious rash, lesion, or ulcer.   LABORATORY PANEL:   CBC  Recent Labs Lab 04/18/16 0457  WBC 11.1*  HGB 11.3*  HCT 32.4*  PLT 191   ------------------------------------------------------------------------------------------------------------------ Chemistries   Recent Labs Lab 04/16/16 1851  04/18/16 0457  NA 134*  < > 138  K 3.0*  < > 3.2*  CL 102  < > 111  CO2 23  < > 22  GLUCOSE 123*  < > 94  BUN 9  < > <5*  CREATININE 0.92  < > 0.71  CALCIUM 8.9  < > 8.0*  MG  --   < > 1.7  AST 15  --   --   ALT 13*  --   --   ALKPHOS 73  --   --   BILITOT 1.2  --   --   < > = values in this interval not displayed. ------------------------------------------------------------------------------------------------------------------  Cardiac Enzymes No results for input(s): TROPONINI in the last 168 hours. ------------------------------------------------------------------------------------------------------------------  RADIOLOGY:  Dg Chest 2 View  Result Date: 04/18/2016 CLINICAL DATA:  Cough EXAM: CHEST  2 VIEW COMPARISON:  04/16/2016 FINDINGS: Cardiac shadow is stable. The lungs are well aerated bilaterally. Mild bibasilar infiltrate is seen right greater than left with associated effusion. No bony abnormality is noted. IMPRESSION:  Increasing bibasilar infiltrates. Electronically Signed   By: Alcide CleverMark  Lukens M.D.   On: 04/18/2016 11:25   Dg Chest 2 View  Result Date: 04/16/2016 CLINICAL DATA:  Fever, vomiting, nausea, chest pain, cough,  myalgias, chills. EXAM: CHEST  2 VIEW COMPARISON:  None. FINDINGS: Normal sized heart. Clear lungs. Mild central peribronchial thickening. Mild scoliosis. IMPRESSION: Mild bronchitic changes. Electronically Signed   By: Beckie SaltsSteven  Reid M.D.   On: 04/16/2016 20:30   [place] Renal  Result Date: 04/17/2016 CLINICAL DATA:  UTI and sepsis EXAM: RENAL / URINARY TRACT ULTRASOUND COMPLETE COMPARISON:  None. FINDINGS: Right Kidney: Length: 11.6 cm. Echogenicity within normal limits. No mass or hydronephrosis visualized. Left Kidney: Length: 11.0 cm. Echogenicity within normal limits. No mass or hydronephrosis visualized. Bladder: Appears normal for degree of bladder distention. IMPRESSION: No acute abnormality noted. Electronically Signed   By: Alcide CleverMark  Lukens M.D.   On: 04/17/2016 09:30     ASSESSMENT AND PLAN:   * Pulmonary edema on chest x-ray Due to fluid resuscitation for sepsis Stop IV fluids. Give 1 dose of oral Lasix.  * Acute pyelonephritis with sepsis - GNR in urine Improving Continue ceftriaxone.  Wait for final cultures Renal ultrasound showed no hydronephrosis or obstruction.  * Hypokalemia Replaced orally  * DVT prophylaxis with SCDs No heparin or Lovenox due to significant hematuria  All the records are reviewed and case discussed with Care Management/Social Workerr. Management plans discussed with the patient, family and they are in agreement.  CODE STATUS: FULL CODE  DVT Prophylaxis: SCDs  TOTAL CC TIME TAKING CARE OF THIS PATIENT: 35 minutes.   POSSIBLE D/C IN 2-3 DAYS, DEPENDING ON CLINICAL CONDITION.  Milagros LollSudini, Tamani Durney R M.D on 04/18/2016 at 12:11 PM  Between 7am to 6pm - Pager - [phone]  After 6pm go to [email or web address] - password [email or web address]  [facility] [place] [facility]  Office  [phone]  CC: Primary care physician; Pcp Not In System  Note: This dictation was prepared with Dragon dictation along with smaller phrase technology. Any transcriptional errors that result  from this process are unintentional.

## 2016-04-18 NOTE — Progress Notes (Signed)
Shift assessment was completed at 0745, see flowsheet. Pt alert and oriented, c/o pain to her back rated 7/10. PIV#22 intact to Rac with ivf NS infusign at 12350mls/hr, site to this arm and saline lock to Lac both free of redness and swelling.  Pt received pain medication as ordered, Dr. Elpidio AnisSudini in on rounds at 0910. Pt subsequently had a chest x ray ordered and completed. Since that time, IVF has been dc'd per MD order. Pt temp and throat checked at 1140 as pt was very upset and crying because her iv pump had been alarming and would not stop. Pt temp at that time was 98.4 and pt's throat was free of redness, discoloration, exudate, etc. Pt c/o sore throat and headache at that time, pt was offered med ordered for headache and she refused it, stating that she is unable to swallow pills. Pt was given [place] for her throat.

## 2016-04-18 NOTE — Progress Notes (Signed)
Pt had stated on rounds at 1600 that she had begun feeling dizzy when walking to the bathroom.  This Clinical research associatewriter asked her at that time to ring call bell for assisst, for the puprpose of checking orthostatic bps as well since pt reported resolution of sx with rest in bed. Pt has received zofran for reported reflux and just received pain med as well. Urine is light yellow and clear. Pt stated that she walked to the bathroom with assist from visitor and that she felt symptoms were improving. This Clinical research associatewriter again asked pt to call staff for assist when needed.

## 2016-04-19 LAB — URINE CULTURE

## 2016-04-19 MED ORDER — HYDROCODONE-ACETAMINOPHEN 7.5-325 MG/15ML PO SOLN: 10.0000 mL | Freq: Four times a day (QID) | ORAL | 0 refills | Status: DC | PRN

## 2016-04-19 MED ORDER — CIPROFLOXACIN 250 MG/5ML (5%) PO SUSR: 500.0000 mg | Freq: Two times a day (BID) | ORAL | 0 refills | Status: DC

## 2016-04-19 MED ORDER — ONDANSETRON 4 MG PO TBDP
4.0000 mg | ORAL_TABLET | Freq: Three times a day (TID) | ORAL | 0 refills | Status: DC | PRN
Start: 2016-04-19 — End: 2017-02-24

## 2016-04-19 NOTE — Discharge Instructions (Signed)

## 2016-04-19 NOTE — Progress Notes (Signed)
Pt being discharged home today. Discharge instructions reviewed with with pt and all questions answered. Educated on all medications being continued including pain, nausea, and antibiotic. Instructed to take entire course of antibiotics. She will follow up with her PCP in 1-2 weeks. She is leaving with all her belongings, will be transported home via family.

## 2016-04-19 NOTE — Progress Notes (Signed)
[facility]         [facility] [place]   04/19/2016  Patient: Laura Sosa Bhandari   Date of Birth:  06/25/1986  Date of admission:  04/16/2016  Date of Discharge  04/19/2016    To Whom it May Concern:   Laura Sosa Rinks  may return to work on 04/23/2016.   If you have any questions or concerns, please don't hesitate to call.  Sincerely,   Milagros LollSudini, Espn Zeman R M.D Office : [phone]   .

## 2016-04-21 LAB — CULTURE, BLOOD (ROUTINE X 2)
CULTURE: NO GROWTH
CULTURE: NO GROWTH

## 2016-04-21 NOTE — Discharge Summary (Signed)
[facility] [place] [facility]   PATIENT NAME: Laura Sosa    MR#:  [id number]  DATE OF BIRTH:  07/08/1986  DATE OF ADMISSION:  04/16/2016 ADMITTING PHYSICIAN: Jon GillsAlexis Hugelmeyer, DO  DATE OF DISCHARGE: 04/19/2016  1:05 PM  PRIMARY CARE PHYSICIAN: Pcp Not In System   ADMISSION DIAGNOSIS:  Pyelonephritis [N12] Sepsis secondary to UTI (HCC) [A41.9, N39.0] Sepsis, unspecified organism (HCC) [A41.9]  DISCHARGE DIAGNOSIS:  Active Problems:   Sepsis secondary to UTI (HCC)   Pyelonephritis   SECONDARY DIAGNOSIS:  History reviewed. No pertinent past medical history.   ADMITTING HISTORY  HISTORY OF PRESENT ILLNESS: Laura Sosa is a 30 y.o. female with No known medical history was in a usual state of health until 3 days ago when she developed fevers, chills, body aches and right sided mid to low back pain. She reports that for the last 2-3 days she has had vomiting for which she received an intramuscular injection of Phenergan earlier in the day without any resolution of her symptoms. She came to the emergency department for evaluation secondary to worsening low back pain, decreased by mouth intake, body aches, fever and back pain. She denies any abdominal pain or dysuria. She did have one episode of hematuria in the emergency department today.  Otherwise there has been no change in status. Patient has been taking medication as prescribed and there has been no recent change in medication or diet.  There has been no recent illness, travel or sick contacts.    Patient denies fevers/chills, weakness, dizziness, chest pain, shortness of breath, N/V/C/D, abdominal pain, dysuria/frequency, changes in mental status.    HOSPITAL COURSE:   * Acute pyelonephritis with sepsis - Escherichia coli which is pansensitive Afebrile. Normal WBC by day of discharge. IV ceftriaxone during hospitalization. Switched to oral ciprofloxacin at discharge. Renal  ultrasound showed no hydronephrosis or obstruction. The patient's given for antibiotics, nausea medication and pain medications. Follow-up with primary care physician in one week.  * Pulmonary edema on chest x-ray With some cough and crackles on exam on day 2 of admission Due to fluid resuscitation for sepsis IV fluids were stopped. One dose of Lasix given. Symptoms resolved.  * Hypokalemia Replaced orally  * DVT prophylaxis with SCDs during hospitalization No heparin or Lovenox due to significant hematuria  Patient is stable for discharge home.  CONSULTS OBTAINED:    DRUG ALLERGIES:  No Known Allergies  DISCHARGE MEDICATIONS:   Discharge Medication List as of 04/19/2016 11:13 AM    START taking these medications   Details  ciprofloxacin (CIPRO) 250 MG/5ML (5%) SUSR Take 10 mLs (500 mg total) by mouth 2 (two) times daily., Starting Thu 04/19/2016, Print    HYDROcodone-acetaminophen (HYCET) 7.5-325 mg/15 ml solution Take 10 mLs by mouth every 6 (six) hours as needed for severe pain., Starting Thu 04/19/2016, Print    ondansetron (ZOFRAN ODT) 4 MG disintegrating tablet Take 1 tablet (4 mg total) by mouth every 8 (eight) hours as needed for nausea or vomiting., Starting Thu 04/19/2016, Print      CONTINUE these medications which have NOT CHANGED   Details  rizatriptan (MAXALT-MLT) 10 MG disintegrating tablet Take 10 mg by mouth as needed for migraine. May repeat in 2 hours if needed, Historical Med        Today   VITAL SIGNS:  Blood pressure 105/70, pulse (!) 57, temperature 98.3 F (36.8 C), temperature source Oral, resp. rate 18, height 5\' 3"  (1.6 m), weight 67.8  kg (149 lb 6.4 oz), last menstrual period 04/09/2016, SpO2 100 %, unknown if currently breastfeeding.  I/O:  No intake or output data in the 24 hours ending 04/21/16 1644  PHYSICAL EXAMINATION:  Physical Exam  GENERAL:  30 y.o.-year-old patient lying in the bed with no acute distress.  LUNGS: Normal breath  sounds bilaterally, no wheezing, rales,rhonchi or crepitation. No use of accessory muscles of respiration.  CARDIOVASCULAR: S1, S2 normal. No murmurs, rubs, or gallops.  ABDOMEN: Soft, non-tender, non-distended. Bowel sounds present. No organomegaly or mass.  NEUROLOGIC: Moves all 4 extremities. PSYCHIATRIC: The patient is alert and oriented x 3.  SKIN: No obvious rash, lesion, or ulcer.   DATA REVIEW:   CBC  Recent Labs Lab 04/18/16 0457  WBC 11.1*  HGB 11.3*  HCT 32.4*  PLT 191    Chemistries   Recent Labs Lab 04/16/16 1851  04/18/16 0457  NA 134*  < > 138  K 3.0*  < > 3.2*  CL 102  < > 111  CO2 23  < > 22  GLUCOSE 123*  < > 94  BUN 9  < > <5*  CREATININE 0.92  < > 0.71  CALCIUM 8.9  < > 8.0*  MG  --   < > 1.7  AST 15  --   --   ALT 13*  --   --   ALKPHOS 73  --   --   BILITOT 1.2  --   --   < > = values in this interval not displayed.  Cardiac Enzymes No results for input(s): TROPONINI in the last 168 hours.  Microbiology Results  Results for orders placed or performed during the hospital encounter of 04/16/16  Culture, blood (Routine x 2)     Status: None   Collection Time: 04/16/16  6:52 PM  Result Value Ref Range Status   Specimen Description BLOOD LAC  Final   Special Requests BOTTLES DRAWN AEROBIC AND ANAEROBIC  Final   Culture NO GROWTH 5 DAYS  Final   Report Status 04/21/2016 FINAL  Final  Culture, blood (Routine x 2)     Status: None   Collection Time: 04/16/16  6:53 PM  Result Value Ref Range Status   Specimen Description BLOOD  Final   Special Requests NONE  Final   Culture NO GROWTH 5 DAYS  Final   Report Status 04/21/2016 FINAL  Final  Urine culture     Status: Abnormal   Collection Time: 04/16/16  6:53 PM  Result Value Ref Range Status   Specimen Description URINE, CLEAN CATCH URINE, CLEAN CATCH  Final   Special Requests NONE  Final   Culture >=100,000 COLONIES/mL ESCHERICHIA COLI (A)  Final   Report Status 04/19/2016 FINAL  Final    Organism ID, Bacteria ESCHERICHIA COLI (A)  Final      Susceptibility   Escherichia coli - MIC*    AMPICILLIN <=2 SENSITIVE Sensitive     CEFAZOLIN <=4 SENSITIVE Sensitive     CEFTRIAXONE <=1 SENSITIVE Sensitive     CIPROFLOXACIN <=0.25 SENSITIVE Sensitive     GENTAMICIN <=1 SENSITIVE Sensitive     IMIPENEM <=0.25 SENSITIVE Sensitive     NITROFURANTOIN <=16 SENSITIVE Sensitive     TRIMETH/SULFA <=20 SENSITIVE Sensitive     AMPICILLIN/SULBACTAM <=2 SENSITIVE Sensitive     PIP/TAZO <=4 SENSITIVE Sensitive     Extended ESBL NEGATIVE Sensitive     * >=100,000 COLONIES/mL ESCHERICHIA COLI    RADIOLOGY:  No results  found.  Follow up with PCP in 1 week.  Management plans discussed with the patient, family and they are in agreement.  CODE STATUS:  Code Status History    Date Active Date Inactive Code Status Order ID Comments User Context   04/16/2016 11:04 PM 04/19/2016  6:59 PM Full Code [id number]  Tonye Royalty, DO ED      TOTAL TIME TAKING CARE OF THIS PATIENT ON DAY OF DISCHARGE: more than 30 minutes.   Milagros Loll R M.D on 04/21/2016 at 4:44 PM  Between 7am to 6pm - Pager - [phone]  After 6pm go to [email or web address] - password [email or web address]  [facility]  Office  [phone]  CC: Primary care physician; Pcp Not In System  Note: This dictation was prepared with Dragon dictation along with smaller phrase technology. Any transcriptional errors that result from this process are unintentional.

## 2017-02-24 ENCOUNTER — Encounter: Payer: Self-pay | Admitting: Emergency Medicine

## 2017-02-24 ENCOUNTER — Emergency Department
Admission: EM | Admit: 2017-02-24 | Discharge: 2017-02-24 | Disposition: A | Discharge status: Home / Self Care | Payer: Medicaid Other | Attending: Emergency Medicine | Admitting: Emergency Medicine

## 2017-02-24 ENCOUNTER — Emergency Department: Payer: Medicaid Other

## 2017-02-24 DIAGNOSIS — N200 Calculus of kidney: Secondary | ICD-10-CM | POA: Diagnosis not present

## 2017-02-24 DIAGNOSIS — R1031 Right lower quadrant pain: Secondary | ICD-10-CM | POA: Diagnosis present

## 2017-02-24 DIAGNOSIS — Z79899 Other long term (current) drug therapy: Secondary | ICD-10-CM | POA: Diagnosis not present

## 2017-02-24 LAB — CBC WITH DIFFERENTIAL/PLATELET
BASOS ABS: 0.1 10*3/uL (ref 0–0.1)
Basophils Relative: 1 %
EOS PCT: 2 %
Eosinophils Absolute: 0.2 10*3/uL (ref 0–0.7)
HCT: 40.4 % (ref 35.0–47.0)
Hemoglobin: 14.1 g/dL (ref 12.0–16.0)
Lymphocytes Relative: 32 %
Lymphs Abs: 3.6 10*3/uL (ref 1.0–3.6)
MCH: 29.8 pg (ref 26.0–34.0)
MCHC: 35 g/dL (ref 32.0–36.0)
MCV: 85.1 fL (ref 80.0–100.0)
MONO ABS: 0.8 10*3/uL (ref 0.2–0.9)
Monocytes Relative: 7 %
Neutro Abs: 6.4 10*3/uL (ref 1.4–6.5)
Neutrophils Relative %: 58 %
PLATELETS: 304 10*3/uL (ref 150–440)
RBC: 4.74 MIL/uL (ref 3.80–5.20)
RDW: 12.5 % (ref 11.5–14.5)
WBC: 11.2 10*3/uL — ABNORMAL HIGH (ref 3.6–11.0)

## 2017-02-24 LAB — URINALYSIS, COMPLETE (UACMP) WITH MICROSCOPIC
Bacteria, UA: NONE SEEN
Specific Gravity, Urine: 1.029 (ref 1.005–1.030)

## 2017-02-24 LAB — BASIC METABOLIC PANEL
ANION GAP: 11 (ref 5–15)
BUN: 16 mg/dL (ref 6–20)
CALCIUM: 9.2 mg/dL (ref 8.9–10.3)
CO2: 18 mmol/L — ABNORMAL LOW (ref 22–32)
CREATININE: 0.79 mg/dL (ref 0.44–1.00)
Chloride: 106 mmol/L (ref 101–111)
GFR calc Af Amer: >60 mL/min (ref >60)
GFR calc non Af Amer: >60 mL/min (ref >60)
GLUCOSE: 121 mg/dL — AB (ref 65–99)
Potassium: 2.9 mmol/L — ABNORMAL LOW (ref 3.5–5.1)
Sodium: 135 mmol/L (ref 135–145)

## 2017-02-24 LAB — PREGNANCY, URINE: Preg Test, Ur: NEGATIVE

## 2017-02-24 MED ORDER — OXYCODONE HCL 5 MG/5ML PO SOLN
5.0000 mg | Freq: Once | ORAL | Status: AC
  Administered 2017-02-24: 5 mg via ORAL
  Filled 2017-02-24: qty 5

## 2017-02-24 MED ORDER — SODIUM CHLORIDE 0.9 % IV BOLUS (SEPSIS)
1000.0000 mL | Freq: Once | INTRAVENOUS | Status: AC
  Administered 2017-02-24: 1000 mL via INTRAVENOUS

## 2017-02-24 MED ORDER — TAMSULOSIN HCL 0.4 MG PO CAPS: 0.4000 mg | ORAL_CAPSULE | Freq: Every day | ORAL | 0 refills | Status: DC

## 2017-02-24 MED ORDER — ONDANSETRON HCL 4 MG/2ML IJ SOLN
4.0000 mg | Freq: Once | INTRAMUSCULAR | Status: AC
  Administered 2017-02-24: 4 mg via INTRAVENOUS
  Filled 2017-02-24: qty 2

## 2017-02-24 MED ORDER — OXYCODONE-ACETAMINOPHEN 5-325 MG/5ML PO SOLN: 5.0000 mL | Freq: Four times a day (QID) | ORAL | 0 refills | Status: DC | PRN

## 2017-02-24 MED ORDER — ONDANSETRON HCL 4 MG/2ML IJ SOLN: 4.0000 mg | Freq: Once | INTRAMUSCULAR | Status: DC

## 2017-02-24 MED ORDER — KETOROLAC TROMETHAMINE 30 MG/ML IJ SOLN
30.0000 mg | Freq: Once | INTRAMUSCULAR | Status: AC
  Administered 2017-02-24: 30 mg via INTRAVENOUS
  Filled 2017-02-24: qty 1

## 2017-02-24 MED ORDER — HYDROMORPHONE HCL 1 MG/ML IJ SOLN
0.5000 mg | Freq: Once | INTRAMUSCULAR | Status: AC
  Administered 2017-02-24: 0.5 mg via INTRAVENOUS

## 2017-02-24 MED ORDER — HYDROMORPHONE HCL 1 MG/ML IJ SOLN
0.5000 mg | Freq: Once | INTRAMUSCULAR | Status: DC
  Filled 2017-02-24: qty 1

## 2017-02-24 MED ORDER — ONDANSETRON 4 MG PO TBDP
4.0000 mg | ORAL_TABLET | Freq: Three times a day (TID) | ORAL | 0 refills | Status: DC | PRN
Start: 2017-02-24 — End: 2017-06-21

## 2017-02-24 MED ORDER — FENTANYL CITRATE (PF) 100 MCG/2ML IJ SOLN
50.0000 ug | INTRAMUSCULAR | Status: DC | PRN
  Administered 2017-02-24: 50 ug via NASAL
  Filled 2017-02-24: qty 2

## 2017-02-24 MED ORDER — HYDROMORPHONE HCL 1 MG/ML IJ SOLN
INTRAMUSCULAR | Status: AC
  Administered 2017-02-24: 0.5 mg via INTRAVENOUS
  Filled 2017-02-24: qty 1

## 2017-02-24 MED ORDER — TAMSULOSIN HCL 0.4 MG PO CAPS
0.4000 mg | ORAL_CAPSULE | Freq: Once | ORAL | Status: AC
  Administered 2017-02-24: 0.4 mg via ORAL
  Filled 2017-02-24: qty 1

## 2017-02-24 MED ORDER — POTASSIUM CHLORIDE 20 MEQ PO PACK
40.0000 meq | PACK | Freq: Once | ORAL | Status: AC
  Administered 2017-02-24: 40 meq via ORAL
  Filled 2017-02-24: qty 2

## 2017-02-24 NOTE — Discharge Instructions (Signed)
1. Take pain & nausea medicines as needed (Roxicet/Zofran). Make sure to take a stool softener while taking narcotic pain medicines. 2. Take Flomax 0.4mg  daily x 14 days. 3. Drink plenty of bottled or filtered water daily. 4. Return to the ER for worsening symptoms, persistent vomiting, fever, difficulty breathing or other concerns.

## 2017-02-24 NOTE — ED Provider Notes (Signed)
[facility] Emergency Department Provider Note   ____________________________________________   First MD Initiated Contact with Patient 02/24/17 [phone]     (approximate)  I have reviewed the triage vital signs and the nursing notes.   HISTORY  Chief Complaint Back Pain; Abdominal Pain; and Urinary Retention    HPI Laura Sosa is a 31 y.o. female who presents to the ED from home with a chief complaint of flank pain. Patient reports sudden onset of right flank pain approximately 6 hours ago, sharp, waxing/waning, radiating to her suprapubic area. Feels like she cannot void and only voided small amounts of urine when she does. Symptoms associated with nuasea/vomiting. Has not experienced similar symptoms previously.Denies associated fever, chills, chest pain, shortness of breath, diarrhea. Denies recent travel or trauma. Nothing makes her symptoms better or worse.   Past Medical History None  Patient Active Problem List   Diagnosis Date Noted  . Pyelonephritis 04/17/2016  . Sepsis secondary to UTI (HCC) 04/16/2016    History reviewed. No pertinent surgical history.  Prior to Admission medications   Medication Sig Start Date End Date Taking? Authorizing Provider  ciprofloxacin (CIPRO) 250 MG/5ML (5%) SUSR Take 10 mLs (500 mg total) by mouth 2 (two) times daily. 04/19/16   Milagros Loll, MD  HYDROcodone-acetaminophen (HYCET) 7.5-325 mg/15 ml solution Take 10 mLs by mouth every 6 (six) hours as needed for severe pain. 04/19/16   Milagros Loll, MD  ondansetron (ZOFRAN ODT) 4 MG disintegrating tablet Take 1 tablet (4 mg total) by mouth every 8 (eight) hours as needed for nausea or vomiting. 02/24/17   Irean Hong, MD  oxyCODONE-acetaminophen (ROXICET) [phone] MG/5ML solution Take 5 mLs by mouth every 6 (six) hours as needed for severe pain. 02/24/17   Irean Hong, MD  rizatriptan (MAXALT-MLT) 10 MG disintegrating tablet Take 10 mg by mouth as needed for  migraine. May repeat in 2 hours if needed    [provider]  tamsulosin (FLOMAX) 0.4 MG CAPS capsule Take 1 capsule (0.4 mg total) by mouth daily. 02/24/17   Irean Hong, MD    Allergies Patient has no known allergies.  History reviewed. No pertinent family history.  Social History Social History  Substance Use Topics  . Smoking status: Never Smoker  . Smokeless tobacco: Never Used  . Alcohol use No    Review of Systems  Constitutional: No fever/chills. Eyes: No visual changes. ENT: No sore throat. Cardiovascular: Denies chest pain. Respiratory: Denies shortness of breath. Gastrointestinal: Positive for suprapubic abdominal pain, nausea and vomiting.  No diarrhea.  No constipation.  Positive for right flank pain. Genitourinary: Negative for dysuria. Musculoskeletal: Negative for back pain. Skin: Negative for rash. Neurological: Negative for headaches, focal weakness or numbness.   ____________________________________________   PHYSICAL EXAM:  VITAL SIGNS: ED Triage Vitals  Enc Vitals Group     BP 02/24/17 0410 (!) 114/38     Pulse Rate 02/24/17 0410 (!) 110     Resp 02/24/17 0410 (!) 26     Temp --      Temp src --      SpO2 02/24/17 0410 100 %     Weight 02/24/17 0356 145 lb (65.8 kg)     Height 02/24/17 0356 5\' 3"  (1.6 m)     Head Circumference --      Peak Flow --      Pain Score 02/24/17 0355 10     Pain Loc --  Pain Edu? --      Excl. in GC? --     Constitutional: Alert and oriented. Well appearing and in moderate acute distress. Eyes: Conjunctivae are normal. PERRL. EOMI. Head: Atraumatic. Nose: No congestion/rhinnorhea. Mouth/Throat: Mucous membranes are moist.  Oropharynx non-erythematous. Neck: No stridor.   Cardiovascular: Normal rate, regular rhythm. Grossly normal heart sounds.  Good peripheral circulation. Respiratory: Normal respiratory effort.  No retractions. Lungs CTAB. Gastrointestinal: Soft and mildly tender to palpation  suprapubic area without rebound or guarding. No distention. No abdominal bruits. No CVA tenderness. Musculoskeletal: No lower extremity tenderness nor edema.  No joint effusions. Neurologic:  Normal speech and language. No gross focal neurologic deficits are appreciated. No gait instability. Skin:  Skin is warm, dry and intact. No rash noted. Psychiatric: Mood and affect are normal. Speech and behavior are normal.  ____________________________________________   LABS (all labs ordered are listed, but only abnormal results are displayed)  Labs Reviewed  URINALYSIS, COMPLETE (UACMP) WITH MICROSCOPIC - Abnormal; Notable for the following:       Result Value   Color, Urine RED (*)    APPearance CLOUDY (*)    Glucose, UA   (*)    Value: TEST NOT REPORTED DUE TO COLOR INTERFERENCE OF URINE PIGMENT   Hgb urine dipstick   (*)    Value: TEST NOT REPORTED DUE TO COLOR INTERFERENCE OF URINE PIGMENT   Bilirubin Urine   (*)    Value: TEST NOT REPORTED DUE TO COLOR INTERFERENCE OF URINE PIGMENT   Ketones, ur   (*)    Value: TEST NOT REPORTED DUE TO COLOR INTERFERENCE OF URINE PIGMENT   Protein, ur   (*)    Value: TEST NOT REPORTED DUE TO COLOR INTERFERENCE OF URINE PIGMENT   Nitrite   (*)    Value: TEST NOT REPORTED DUE TO COLOR INTERFERENCE OF URINE PIGMENT   Leukocytes, UA   (*)    Value: TEST NOT REPORTED DUE TO COLOR INTERFERENCE OF URINE PIGMENT   Squamous Epithelial / LPF TOO NUMEROUS TO COUNT (*)    All other components within normal limits  CBC WITH DIFFERENTIAL/PLATELET - Abnormal; Notable for the following:    WBC 11.2 (*)    All other components within normal limits  BASIC METABOLIC PANEL - Abnormal; Notable for the following:    Potassium 2.9 (*)    CO2 18 (*)    Glucose, Bld 121 (*)    All other components within normal limits  PREGNANCY, URINE   ____________________________________________  EKG  None ____________________________________________  RADIOLOGY  Ct Renal  Stone Study  Result Date: 02/24/2017 CLINICAL DATA:  6 hours of low back pain on the right, radiating to the suprapubic region. Hematuria. EXAM: CT ABDOMEN AND PELVIS WITHOUT CONTRAST TECHNIQUE: Multidetector CT imaging of the abdomen and pelvis was performed following the standard protocol without IV contrast. COMPARISON:  None. FINDINGS: Lower chest: No acute abnormality. Hepatobiliary: No focal liver abnormality is seen. No gallstones, gallbladder wall thickening, or biliary dilatation. Pancreas: Unremarkable. No pancreatic ductal dilatation or surrounding inflammatory changes. Spleen: Normal in size without focal abnormality. Adrenals/Urinary Tract: Both adrenals are normal. Bilateral nephrolithiasis measuring up to 3 mm. Marked hydronephrosis and ureteral dilatation on the right, with a 3 x 4 mm obstructing calculus in the distal right ureter approximately 2 cm from the UVJ. Urinary bladder is unremarkable. Stomach/Bowel: Stomach is within normal limits. Colon appears normal. No evidence of bowel wall thickening, distention, or inflammatory changes. Vascular/Lymphatic: No significant vascular  findings are present. No enlarged abdominal or pelvic lymph nodes. Reproductive: Uterus and bilateral adnexa are unremarkable. Other: No ascites.  Small fat containing umbilical hernia. Musculoskeletal: No significant skeletal lesion. IMPRESSION: Obstructing 3 x 4 mm distal right ureteral calculus with marked hydronephrosis and ureteral dilatation. Bilateral nephrolithiasis. Electronically Signed   By: Ellery Plunk M.D.   On: 02/24/2017 05:35    ____________________________________________   PROCEDURES  Procedure(s) performed: None  Procedures  Critical Care performed: No  ____________________________________________   INITIAL IMPRESSION / ASSESSMENT AND PLAN / ED COURSE  Pertinent labs & imaging results that were available during my care of the patient were reviewed by me and considered in my  medical decision making (see chart for details).  31 year old female who presents with right flank to abdominal pain. Laboratory urinalysis results remarkable for mild hypokalemia, color interference of urine. Suspect renal colic; will proceed to CT renal stone study.  Clinical Course as of Feb 24 625  Wynelle Link Feb 24, 2017  4098 Patient had significant relief of symptoms after Toradol administration. Updated patient and mother of CT imaging results. Patient states she cannot take pills; will prescribe liquid analgesia, ODT zofran; she was able to take Flomax by nurse crushing it and mixing it with apple sauce. Strict return precautions given; patient and her mother verbalize understanding and agree with plan of care.  [JS]    Clinical Course User Index [JS] Irean Hong, MD     ____________________________________________   FINAL CLINICAL IMPRESSION(S) / ED DIAGNOSES  Final diagnoses:  Kidney stone      NEW MEDICATIONS STARTED DURING THIS VISIT:  New Prescriptions   ONDANSETRON (ZOFRAN ODT) 4 MG DISINTEGRATING TABLET    Take 1 tablet (4 mg total) by mouth every 8 (eight) hours as needed for nausea or vomiting.   OXYCODONE-ACETAMINOPHEN (ROXICET) 5-325 MG/5ML SOLUTION    Take 5 mLs by mouth every 6 (six) hours as needed for severe pain.   TAMSULOSIN (FLOMAX) 0.4 MG CAPS CAPSULE    Take 1 capsule (0.4 mg total) by mouth daily.     Note:  This document was prepared using Dragon voice recognition software and may include unintentional dictation errors.    Irean Hong, MD 02/24/17 [phone]

## 2017-02-24 NOTE — ED Triage Notes (Signed)
Pt reports 6 hours of right low back pain accompanied by sharp pains to suprapubic area; feels like she cannot void; was able to provide small amount of urine that is red tinged; pt restless, moaning;

## 2017-02-24 NOTE — ED Notes (Signed)
Urine specimen provided not enough for UA and urine preg; specimen sent to lab for analysis; pt back to bathroom to void

## 2017-02-24 NOTE — ED Notes (Signed)
Pt registered and went immediately to bathroom; given wipes and container for urine collection; pt reports being unable to void yet going frequently to bathroom to attempt

## 2017-02-24 NOTE — ED Notes (Signed)
Bladder scanned patient, 3rd attempt 45 mL. AS

## 2017-02-24 NOTE — ED Notes (Signed)
Bladder scan resulted in 21 ml.

## 2017-02-25 ENCOUNTER — Telehealth: Payer: Self-pay | Admitting: Medical Oncology

## 2017-02-25 MED ORDER — HYDROCODONE-ACETAMINOPHEN 7.5-325 MG/15ML PO SOLN: 10.0000 mL | Freq: Four times a day (QID) | ORAL | Status: AC | PRN

## 2017-02-25 NOTE — Telephone Encounter (Signed)
Pt called stating that pharmacy does not stock roxicet anymore, spoke with Dr Cyril LoosenKinner and [facility]325mg /9115ml, 10ml Q6Hprn severe pain. Pts mother is bringing old RX in exchanged for new.

## 2017-02-25 NOTE — ED Provider Notes (Signed)
Patient unable to get roxicet solution at pharmacy because it apparently is not stocked, wrote separate rx for Edythe Clarityhycet   Makaylin Carlo, MD 02/25/17 1140

## 2017-03-03 ENCOUNTER — Encounter: Payer: Self-pay | Admitting: Emergency Medicine

## 2017-03-03 ENCOUNTER — Emergency Department
Admission: EM | Admit: 2017-03-03 | Discharge: 2017-03-04 | Disposition: A | Discharge status: Home / Self Care | Payer: Medicaid Other | Attending: Emergency Medicine | Admitting: Emergency Medicine

## 2017-03-03 DIAGNOSIS — R1031 Right lower quadrant pain: Secondary | ICD-10-CM | POA: Diagnosis present

## 2017-03-03 DIAGNOSIS — N201 Calculus of ureter: Secondary | ICD-10-CM | POA: Insufficient documentation

## 2017-03-03 DIAGNOSIS — N200 Calculus of kidney: Secondary | ICD-10-CM

## 2017-03-03 LAB — URINALYSIS, ROUTINE W REFLEX MICROSCOPIC
Bilirubin Urine: NEGATIVE
GLUCOSE, UA: NEGATIVE mg/dL
Ketones, ur: NEGATIVE mg/dL
LEUKOCYTES UA: NEGATIVE
NITRITE: NEGATIVE
PH: 7 (ref 5.0–8.0)
Protein, ur: 100 mg/dL — AB
SPECIFIC GRAVITY, URINE: 1.018 (ref 1.005–1.030)

## 2017-03-03 LAB — COMPREHENSIVE METABOLIC PANEL
ALK PHOS: 64 U/L (ref 38–126)
ALT: 16 U/L (ref 14–54)
ANION GAP: 9 (ref 5–15)
AST: 19 U/L (ref 15–41)
Albumin: 4 g/dL (ref 3.5–5.0)
BUN: 17 mg/dL (ref 6–20)
CALCIUM: 8.9 mg/dL (ref 8.9–10.3)
CO2: 21 mmol/L — AB (ref 22–32)
CREATININE: 0.75 mg/dL (ref 0.44–1.00)
Chloride: 106 mmol/L (ref 101–111)
Glucose, Bld: 84 mg/dL (ref 65–99)
Potassium: 3.5 mmol/L (ref 3.5–5.1)
SODIUM: 136 mmol/L (ref 135–145)
Total Bilirubin: 0.5 mg/dL (ref 0.3–1.2)
Total Protein: 7.2 g/dL (ref 6.5–8.1)

## 2017-03-03 LAB — CBC
HCT: 41.4 % (ref 35.0–47.0)
HEMOGLOBIN: 14.2 g/dL (ref 12.0–16.0)
MCH: 30.1 pg (ref 26.0–34.0)
MCHC: 34.4 g/dL (ref 32.0–36.0)
MCV: 87.7 fL (ref 80.0–100.0)
PLATELETS: 243 10*3/uL (ref 150–440)
RBC: 4.72 MIL/uL (ref 3.80–5.20)
RDW: 12.4 % (ref 11.5–14.5)
WBC: 10.6 10*3/uL (ref 3.6–11.0)

## 2017-03-03 MED ORDER — HYDROCODONE-ACETAMINOPHEN 7.5-325 MG/15ML PO SOLN: 10.0000 mL | Freq: Four times a day (QID) | ORAL | 0 refills | Status: DC | PRN

## 2017-03-03 MED ORDER — FENTANYL CITRATE (PF) 100 MCG/2ML IJ SOLN: 50.0000 ug | INTRAMUSCULAR | Status: DC | PRN

## 2017-03-03 MED ORDER — KETOROLAC TROMETHAMINE 30 MG/ML IJ SOLN
15.0000 mg | Freq: Once | INTRAMUSCULAR | Status: DC
  Filled 2017-03-03: qty 1

## 2017-03-03 MED ORDER — ONDANSETRON HCL 4 MG/2ML IJ SOLN
INTRAMUSCULAR | Status: AC
  Administered 2017-03-03: 4 mg via INTRAVENOUS
  Filled 2017-03-03: qty 2

## 2017-03-03 MED ORDER — KETOROLAC TROMETHAMINE 30 MG/ML IJ SOLN
30.0000 mg | Freq: Once | INTRAMUSCULAR | Status: AC
  Administered 2017-03-03: 30 mg via INTRAVENOUS
  Filled 2017-03-03: qty 1

## 2017-03-03 MED ORDER — ONDANSETRON HCL 4 MG/2ML IJ SOLN
4.0000 mg | Freq: Once | INTRAMUSCULAR | Status: AC
  Administered 2017-03-03: 4 mg via INTRAVENOUS
  Filled 2017-03-03: qty 2

## 2017-03-03 NOTE — ED Triage Notes (Signed)
Patient presents to the ED with right flank pain.  Patient appears very uncomfortable, having difficulty sitting still in wheelchair.  Patient states she was diagnosed with a 3mm kidney stone a few days ago and has been taking hydrocodone and zofran but states today pain became severe and patient states she has been unable to walk.  Patient states, "yesterday, I wasn't in any pain at all, I think it moved or something."

## 2017-03-03 NOTE — Discharge Instructions (Addendum)
You have been seen in the Emergency Department (ED) today for pain that we believe based on your workup, is caused by kidney stones.  As we have discussed, please drink plenty of fluids.  Please make a follow up appointment with the physician(s) listed elsewhere in this documentation.  You may take pain medication as needed but ONLY as prescribed.  Please also take your prescribed Flomax daily.  We also recommend that you take over-the-counter ibuprofen regularly according to label instructions over the next 5 days.  Take it with meals to minimize stomach discomfort.  Please see your doctor as soon as possible as stones may take 1-3 weeks to pass and you may require additional care or medications.  Do not drink alcohol, drive or participate in any other potentially dangerous activities while taking opiate pain medication as it may make you sleepy. Do not take this medication with any other sedating medications, either prescription or over-the-counter. If you were prescribed Percocet or Vicodin, do not take these with acetaminophen (Tylenol) as it is already contained within these medications.   Take Hycet as needed for severe pain.  This medication is an opiate (or narcotic) pain medication and can be habit forming.  Use it as little as possible to achieve adequate pain control.  Do not use or use it with extreme caution if you have a history of opiate abuse or dependence.  If you are on a pain contract with your primary care doctor or a pain specialist, be sure to let them know you were prescribed this medication today from [facility].  This medication is intended for your use only - do not give any to anyone else and keep it in a secure place where nobody else, especially children, have access to it.  It will also cause or worsen constipation, so you may want to consider taking an over-the-counter stool softener while you are taking this medication.  Return to the  Emergency Department (ED) or call your doctor if you have any worsening pain, fever, painful urination, are unable to urinate, or develop other symptoms that concern you.

## 2017-03-03 NOTE — ED Provider Notes (Signed)
[facility] Emergency Department Provider Note  Time seen: 10:54 PM  I have reviewed the triage vital signs and the nursing notes.   HISTORY  Chief Complaint Flank Pain    HPI Laura Sosa is a 31 y.o. female with a past medical history of kidney stone who presents to the emergency department for right flank pain. According to the patient and record review the patient was seen approximately one week ago in the emergency department for right flank pain, also might diagnose a 4 mm right ureteral stone. Patient states since going home she has had waxing and waning pain, states the pain was much improved yesterday became back severe today. Currently describes her pain as moderate to severe located in the right flank described as sharp in nature. States some nausea but denies vomiting or diarrhea. Denies fever or dysuria.  History reviewed. No pertinent past medical history.  Patient Active Problem List   Diagnosis Date Noted  . Pyelonephritis 04/17/2016  . Sepsis secondary to UTI (HCC) 04/16/2016    History reviewed. No pertinent surgical history.  Prior to Admission medications   Medication Sig Start Date End Date Taking? Authorizing Provider  HYDROcodone-acetaminophen (HYCET) 7.5-325 mg/15 ml solution Take 10 mLs by mouth every 6 (six) hours as needed for severe pain. 04/19/16  Yes Sudini, Wardell Heath, MD  ondansetron (ZOFRAN ODT) 4 MG disintegrating tablet Take 1 tablet (4 mg total) by mouth every 8 (eight) hours as needed for nausea or vomiting. 02/24/17  Yes Irean Hong, MD  oxyCODONE-acetaminophen (ROXICET) [phone] MG/5ML solution Take 5 mLs by mouth every 6 (six) hours as needed for severe pain. 02/24/17  Yes Irean Hong, MD  tamsulosin (FLOMAX) 0.4 MG CAPS capsule Take 1 capsule (0.4 mg total) by mouth daily. 02/24/17  Yes Irean Hong, MD  rizatriptan (MAXALT-MLT) 10 MG disintegrating tablet Take 10 mg by mouth as needed for migraine. May repeat in 2 hours if  needed    [provider]    No Known Allergies  No family history on file.  Social History Social History  Substance Use Topics  . Smoking status: Never Smoker  . Smokeless tobacco: Never Used  . Alcohol use No    Review of Systems Constitutional: Negative for fever. Cardiovascular: Negative for chest pain. Respiratory: Negative for shortness of breath. Gastrointestinal: Right flank pain Genitourinary: Negative for dysuria. Musculoskeletal: Right back pain Neurological: Negative for headache All other ROS negative  ____________________________________________   PHYSICAL EXAM:  VITAL SIGNS: ED Triage Vitals  Enc Vitals Group     BP 03/03/17 1822 (!) 155/131     Pulse Rate 03/03/17 1822 (!) 107     Resp 03/03/17 1822 18     Temp 03/03/17 1822 98.3 F (36.8 C)     Temp Source 03/03/17 1822 Oral     SpO2 03/03/17 1822 94 %     Weight 03/03/17 1823 145 lb (65.8 kg)     Height 03/03/17 1823 5\' 3"  (1.6 m)     Head Circumference --      Peak Flow --      Pain Score 03/03/17 1822 8     Pain Loc --      Pain Edu? --      Excl. in GC? --     Constitutional: Alert and oriented. Well appearing and in no distress. Eyes: Normal exam ENT   Head: Normocephalic and atraumatic.   Mouth/Throat: Mucous membranes are moist. Cardiovascular: Normal rate, regular  rhythm. No murmur Respiratory: Normal respiratory effort without tachypnea nor retractions. Breath sounds are clear  Gastrointestinal: Soft, mild right mid abdominal tenderness. Mild right CVA tenderness. No rebound or guarding. No distention. Musculoskeletal: Nontender with normal range of motion in all extremities.  Neurologic:  Normal speech and language. No gross focal neurologic deficits Skin:  Skin is warm, dry and intact.  Psychiatric: Mood and affect are normal.  ____________________________________________   INITIAL IMPRESSION / ASSESSMENT AND PLAN / ED COURSE  Pertinent labs & imaging  results that were available during my care of the patient were reviewed by me and considered in my medical decision making (see chart for details).  The patient presents to the emergency department for continued right flank pain. Patient was seen in the emergency department 1 week ago had any CT scan showing a 3 x 4 mm right ureteral stone. Patient states the pain was much improved over the past 2 days however came back today severe. We will check labs, treat the patient's pain. Patient states she has a urology appointment this week. We will refill the patient's home pain medication as long as her labs are normal and her pain is controlled in the emergency department.  Labs are pending. Patient care signed out to Dr. York CeriseForbach.  ____________________________________________   FINAL CLINICAL IMPRESSION(S) / ED DIAGNOSES  Ureterolithiasis.    Minna AntisPaduchowski, Delpha Perko, MD 03/03/17 [phone]

## 2017-03-03 NOTE — ED Provider Notes (Signed)
Clinical Course as of Mar 05 127  Laura Sosa  2341 Assuming care from Laura Sosa.  In short, Laura Sosa is a 31 y.o. female with a chief complaint of flank pain.  Refer to the original H&P for additional details.  The current plan of care is to follow up labs and UA, attempt to control pain, and reassess.   [CF]  Mon Jul 09, Sosa  0126 I reassessed the patient and she is feeling better although she still does have residual pain.  As the results of her studies and the fact that they are reassuring.  I reiterated everything Laura Sosa and encourage close outpatient follow-up.  She insists that she cannot take pills by mouth so I will give her another dose of IV pain medication before she goes.   I gave my usual and customary return precautions.     [CF]    Clinical Course User Index [CF] Laura Sosa, Laura Okun, MD      Laura Sosa, Laura Forse, MD 03/04/17 [phone]

## 2017-03-03 NOTE — ED Notes (Signed)
Pt reports pain was not controlled with PO pain medication at home. Pt reports feeling pain in right flank and a pressure in bladder. Pt requested to be catheterized to relieve the pressure in bladder. Pt was instructed to attempt to urinate first and was able to produce small amount for urine sample.

## 2017-03-04 MED ORDER — MORPHINE SULFATE (PF) 4 MG/ML IV SOLN
4.0000 mg | Freq: Once | INTRAVENOUS | Status: AC
  Administered 2017-03-04: 4 mg via INTRAVENOUS
  Filled 2017-03-04: qty 1

## 2017-03-04 NOTE — ED Notes (Addendum)
Pt. Verbalizes understanding of d/c instructions, prescriptions, and follow-up. VS stable and pain controlled per pt.  Pt. In NAD at time of d/c and denies further concerns regarding this visit. Pt. Stable at the time of departure from the unit, departing unit by the safest and most appropriate manner per that pt condition and limitations. Ambulatory to mother's car (driver observed, pt not driving) with steady gait. Pt advised to return to the ED at any time for emergent concerns, or for new/worsening symptoms.

## 2017-03-05 LAB — URINE CULTURE: Culture: <10000 — AB

## 2017-03-06 ENCOUNTER — Telehealth: Payer: Self-pay | Admitting: Radiology

## 2017-03-06 ENCOUNTER — Other Ambulatory Visit: Payer: Self-pay | Admitting: Radiology

## 2017-03-06 ENCOUNTER — Ambulatory Visit (INDEPENDENT_AMBULATORY_CARE_PROVIDER_SITE_OTHER): Payer: Medicaid Other | Admitting: Urology

## 2017-03-06 ENCOUNTER — Encounter: Payer: Self-pay | Admitting: Urology

## 2017-03-06 VITALS — BP 108/72 | HR 81 | Ht 63.0 in | Wt 147.5 lb

## 2017-03-06 DIAGNOSIS — N200 Calculus of kidney: Secondary | ICD-10-CM

## 2017-03-06 DIAGNOSIS — N201 Calculus of ureter: Secondary | ICD-10-CM

## 2017-03-06 LAB — MICROSCOPIC EXAMINATION: Bacteria, UA: NONE SEEN

## 2017-03-06 LAB — URINALYSIS, COMPLETE
Bilirubin, UA: NEGATIVE
Glucose, UA: NEGATIVE
Ketones, UA: NEGATIVE
Leukocytes, UA: NEGATIVE
Nitrite, UA: NEGATIVE
Protein, UA: NEGATIVE
Specific Gravity, UA: 1.025 (ref 1.005–1.030)
Urobilinogen, Ur: 0.2 mg/dL (ref 0.2–1.0)
pH, UA: 6 (ref 5.0–7.5)

## 2017-03-06 NOTE — Progress Notes (Signed)
03/06/2017 1:35 PM   Laura Sosa 02/10/1986 [id number]  Referring provider: Evelene CroonNiemeyer, Meindert, MD [address] [facility] [place], [place] [id number]  Chief Complaint  Patient presents with  . New Patient (Initial Visit)    kidney stone referred by ER    HPI: The patient is a 31 year old female presents today for ER follow up of a right ureteral calculus. CT revealed a 4 mm (2.5 mm in width) distal right ureteral calculus as well as multiple bilateral nonobstructing stones no larger than 2 mm.  She has been to the emergency department for this 2 times. He was first diagnosed approximately 10 days ago. She has been straining her urine but has not caught the stone. She has intermittent nausea and vomiting. She has no fever or chills. Urine culture a few days ago was negative. She has not been taking Flomax as she cannot swallow pills. She is interested in surgery at this point as she is missing work from the pain.  PMH: No past medical history on file.  Surgical History: History reviewed. No pertinent surgical history.  Home Medications:  Allergies as of 03/06/2017   No Known Allergies     Medication List       Accurate as of 03/06/17  1:35 PM. Always use your most recent med list.          HYDROcodone-acetaminophen 7.5-325 mg/15 ml solution Commonly known as:  HYCET Take 10 mLs by mouth every 6 (six) hours as needed for severe pain.   ondansetron 4 MG disintegrating tablet Commonly known as:  ZOFRAN ODT Take 1 tablet (4 mg total) by mouth every 8 (eight) hours as needed for nausea or vomiting.   oxyCODONE-acetaminophen 5-325 MG/5ML solution Commonly known as:  ROXICET Take 5 mLs by mouth every 6 (six) hours as needed for severe pain.   rizatriptan 10 MG disintegrating tablet Commonly known as:  MAXALT-MLT Take 10 mg by mouth as needed for migraine. May repeat in 2 hours if needed   tamsulosin 0.4 MG Caps capsule Commonly known as:  FLOMAX Take 1 capsule (0.4 mg total)  by mouth daily.       Allergies: No Known Allergies  Family History: Family History  Problem Relation Age of Onset  . Kidney cancer Neg Hx   . Bladder Cancer Neg Hx     Social History:  reports that she has never smoked. She has never used smokeless tobacco. She reports that she does not drink alcohol or use drugs.  ROS: UROLOGY Frequent Urination?: No Hard to postpone urination?: No Burning/pain with urination?: No Get up at night to urinate?: No Leakage of urine?: No Urine stream starts and stops?: No Trouble starting stream?: No Do you have to strain to urinate?: No Blood in urine?: No Urinary tract infection?: No Sexually transmitted disease?: No Injury to kidneys or bladder?: No Painful intercourse?: No Weak stream?: Yes Currently pregnant?: No Vaginal bleeding?: No Last menstrual period?: n  Gastrointestinal Nausea?: Yes Vomiting?: Yes Indigestion/heartburn?: No Diarrhea?: No Constipation?: No  Constitutional Fever: No Night sweats?: No Weight loss?: No Fatigue?: No  Skin Skin rash/lesions?: No Itching?: No  Eyes Blurred vision?: No Double vision?: No  Ears/Nose/Throat Sore throat?: No Sinus problems?: No  Hematologic/Lymphatic Swollen glands?: No Easy bruising?: No  Cardiovascular Leg swelling?: No Chest pain?: No  Respiratory Cough?: No Shortness of breath?: No  Endocrine Excessive thirst?: No  Musculoskeletal Back pain?: Yes Joint pain?: No  Neurological Headaches?: Yes Dizziness?: No  Psychologic Depression?: Yes  Anxiety?: No  Physical Exam: BP 108/72   Pulse 81   Ht 5\' 3"  (1.6 m)   Wt 147 lb 8 oz (66.9 kg)   LMP 02/19/2017 (Exact Date) Comment: neg preg test  BMI 26.13 kg/m   Constitutional:  Alert and oriented, No acute distress. HEENT: [place] AT, moist mucus membranes.  Trachea midline, no masses. Cardiovascular: No clubbing, cyanosis, or edema. Respiratory: Normal respiratory effort, no increased work of  breathing. GI: Abdomen is soft, nontender, nondistended, no abdominal masses GU: No CVA tenderness.  Skin: No rashes, bruises or suspicious lesions. Lymph: No cervical or inguinal adenopathy. Neurologic: Grossly intact, no focal deficits, moving all 4 extremities. Psychiatric: Normal mood and affect.  Laboratory Data: Lab Results  Component Value Date   WBC 10.6 03/03/2017   HGB 14.2 03/03/2017   HCT 41.4 03/03/2017   MCV 87.7 03/03/2017   PLT 243 03/03/2017    Lab Results  Component Value Date   CREATININE 0.75 03/03/2017    No results found for: PSA  No results found for: TESTOSTERONE  No results found for: HGBA1C  Urinalysis    Component Value Date/Time   COLORURINE YELLOW (A) 03/03/2017 2329   APPEARANCEUR CLOUDY (A) 03/03/2017 2329   LABSPEC 1.018 03/03/2017 2329   PHURINE 7.0 03/03/2017 2329   GLUCOSEU NEGATIVE 03/03/2017 2329   HGBUR MODERATE (A) 03/03/2017 2329   BILIRUBINUR NEGATIVE 03/03/2017 2329   KETONESUR NEGATIVE 03/03/2017 2329   PROTEINUR 100 (A) 03/03/2017 2329   NITRITE NEGATIVE 03/03/2017 2329   LEUKOCYTESUR NEGATIVE 03/03/2017 2329    Pertinent Imaging: CT reviewed as above.  Assessment & Plan:    1. Right distal ureteral stone 2. Bilateral nonobstructing renal stones I discussed treatment options with the patient including medical expulsive therapy, lithotripsy, and ureteroscopy. At this point it has been 10 days since her stone was first diagnosed and she is continuing to miss work. She would like to schedule a surgical intervention at this point due to this. We discussed both lithotripsy and ureteroscopy. She understands the risks, benefits, indications of both. She is elected to undergo cystoscopy with right ureteroscopy, laser lithotripsy, and right ureteral stent. She'll continue to strain her urine in the interim. She understands the risks from the surgery include but are not limited to bleeding, infection, and need for repeat  procedures. She was scheduled for this in the near future. We also did discuss that her bilateral nonobstructing stones at this point are quite small, so we decided not to address them in this procedure.    Hildred Laser, MD  [facility] [address], [address] [place], [place] [address] [phone]

## 2017-03-06 NOTE — Telephone Encounter (Signed)
Pt scheduled for ureteroscopy/laser lithotripsy/stent placement with Dr Sherryl BartersBudzyn on 03/15/17. Made pt aware of surgery & pre-admit testing phone interview & post op appt. Per Dr Sherryl BartersBudzyn, make f/u appt on POD 5 d/t pt going on vacation on POD 6. Questions answered. Pt voices understanding.

## 2017-03-08 ENCOUNTER — Encounter
Admission: RE | Admit: 2017-03-08 | Discharge: 2017-03-08 | Disposition: A | Discharge status: Home / Self Care | Payer: Medicaid Other | Source: Ambulatory Visit | Attending: Urology | Admitting: Urology

## 2017-03-08 HISTORY — DX: Personal history of urinary calculi: Z87.442

## 2017-03-08 HISTORY — DX: Headache: R51

## 2017-03-08 HISTORY — DX: Headache, unspecified: R51.9

## 2017-03-08 NOTE — Patient Instructions (Signed)
  Your procedure is scheduled on: 03-15-17 Report to Same Day Surgery 2nd floor medical mall [facility](Medical Mall Entrance-take elevator on left to 2nd floor.  Check in with surgery information desk.) To find out your arrival time please call [phone] between 1PM - 3PM on 03-14-17  Remember: Instructions that are not followed completely may result in serious medical risk, up to and including death, or upon the discretion of your surgeon and anesthesiologist your surgery may need to be rescheduled.    _x___ 1. Do not eat food or drink liquids after midnight. No gum chewing or                              hard candies.     __x__ 2. No Alcohol for 24 hours before or after surgery.   __x__3. No Smoking for 24 prior to surgery.   ____  4. Bring all medications with you on the day of surgery if instructed.    __x__ 5. Notify your doctor if there is any change in your medical condition     (cold, fever, infections).     Do not wear jewelry, make-up, hairpins, clips or nail polish.  Do not wear lotions, powders, or perfumes. You may wear deodorant.  Do not shave 48 hours prior to surgery. Men may shave face and neck.  Do not bring valuables to the hospital.    [facility] not [facility] for any belongings or valuables.               Contacts, dentures or bridgework may not be worn into surgery.  Leave your suitcase in the car. After surgery it may be brought to your room.  For patients admitted to the hospital, discharge time is determined by your                       treatment team.   Patients discharged the day of surgery will not be allowed to drive home.  You will need someone to drive you home and stay with you the night of your procedure.    Please read over the following fact sheets that you were given:   [facility] Preparing for Surgery and or MRSA Information   _x___ Take anti-hypertensive (unless it includes a diuretic), cardiac, seizure, asthma,     anti-reflux and psychiatric  medicines. These include:  1. MAY TAKE HYDROCODONE ELIXER AM OF SURGERY IF NEEDED  2.  3.  4.  5.  6.  ____Fleets enema or Magnesium Citrate as directed.   ____ Use CHG Soap or sage wipes as directed on instruction sheet   ____ Use inhalers on the day of surgery and bring to hospital day of surgery  ____ Stop Metformin and Janumet 2 days prior to surgery.    ____ Take 1/2 of usual insulin dose the night before surgery and none on the morning     surgery.   ____ Follow recommendations from Cardiologist, Pulmonologist or PCP regarding          stopping Aspirin, Coumadin, Pllavix ,Eliquis, Effient, or Pradaxa, and Pletal.  X____Stop Anti-inflammatories such as Advil, Aleve, Ibuprofen, Motrin, Naproxen, Naprosyn, Goodies powders or aspirin products NOW-OK to take Tylenol, HYDROCODONE ELIXER   ____ Stop supplements until after surgery.    ____ Bring C-Pap to the hospital.

## 2017-03-14 MED ORDER — CEFAZOLIN SODIUM-DEXTROSE 2-4 GM/100ML-% IV SOLN
2.0000 g | INTRAVENOUS | Status: AC
  Administered 2017-03-15: 2 g via INTRAVENOUS

## 2017-03-15 ENCOUNTER — Ambulatory Visit: Payer: Medicaid Other | Admitting: Certified Registered Nurse Anesthetist

## 2017-03-15 ENCOUNTER — Encounter
Admission: RE | Disposition: A | Discharge status: Home / Self Care | Payer: Self-pay | Source: Ambulatory Visit | Attending: Urology

## 2017-03-15 ENCOUNTER — Ambulatory Visit
Admission: RE | Admit: 2017-03-15 | Discharge: 2017-03-15 | Disposition: A | Discharge status: Home / Self Care | Payer: Medicaid Other | Source: Ambulatory Visit | Attending: Urology | Admitting: Urology

## 2017-03-15 ENCOUNTER — Telehealth: Payer: Self-pay | Admitting: Radiology

## 2017-03-15 DIAGNOSIS — N201 Calculus of ureter: Secondary | ICD-10-CM | POA: Diagnosis not present

## 2017-03-15 DIAGNOSIS — N202 Calculus of kidney with calculus of ureter: Secondary | ICD-10-CM | POA: Insufficient documentation

## 2017-03-15 DIAGNOSIS — Z79899 Other long term (current) drug therapy: Secondary | ICD-10-CM | POA: Diagnosis not present

## 2017-03-15 DIAGNOSIS — Z711 Person with feared health complaint in whom no diagnosis is made: Secondary | ICD-10-CM | POA: Diagnosis not present

## 2017-03-15 HISTORY — PX: CYSTOSCOPY/RETROGRADE/URETEROSCOPY: SHX5316

## 2017-03-15 LAB — POCT PREGNANCY, URINE: Preg Test, Ur: NEGATIVE

## 2017-03-15 SURGERY — CYSTOSCOPY/RETROGRADE/URETEROSCOPY
Anesthesia: General | Laterality: Right

## 2017-03-15 MED ORDER — FENTANYL CITRATE (PF) 100 MCG/2ML IJ SOLN: 25.0000 ug | INTRAMUSCULAR | Status: DC | PRN

## 2017-03-15 MED ORDER — GLYCOPYRROLATE 0.2 MG/ML IJ SOLN
INTRAMUSCULAR | Status: AC
  Filled 2017-03-15: qty 1

## 2017-03-15 MED ORDER — HYDROCODONE-ACETAMINOPHEN 7.5-325 MG/15ML PO SOLN: 10.0000 mL | Freq: Four times a day (QID) | ORAL | 0 refills | Status: DC | PRN

## 2017-03-15 MED ORDER — LIDOCAINE HCL (CARDIAC) 20 MG/ML IV SOLN
INTRAVENOUS | Status: DC | PRN
  Administered 2017-03-15: 100 mg via INTRAVENOUS

## 2017-03-15 MED ORDER — DEXAMETHASONE SODIUM PHOSPHATE 10 MG/ML IJ SOLN
INTRAMUSCULAR | Status: AC
  Filled 2017-03-15: qty 1

## 2017-03-15 MED ORDER — IOTHALAMATE MEGLUMINE 43 % IV SOLN
INTRAVENOUS | Status: DC | PRN
  Administered 2017-03-15: 20 mL

## 2017-03-15 MED ORDER — PROMETHAZINE HCL 25 MG/ML IJ SOLN: 6.2500 mg | INTRAMUSCULAR | Status: DC | PRN

## 2017-03-15 MED ORDER — FENTANYL CITRATE (PF) 100 MCG/2ML IJ SOLN
INTRAMUSCULAR | Status: DC | PRN
  Administered 2017-03-15: 100 ug via INTRAVENOUS

## 2017-03-15 MED ORDER — SUCCINYLCHOLINE CHLORIDE 20 MG/ML IJ SOLN
INTRAMUSCULAR | Status: AC
  Filled 2017-03-15: qty 1

## 2017-03-15 MED ORDER — PROPOFOL 10 MG/ML IV BOLUS
INTRAVENOUS | Status: AC
  Filled 2017-03-15: qty 20

## 2017-03-15 MED ORDER — DEXAMETHASONE SODIUM PHOSPHATE 10 MG/ML IJ SOLN
INTRAMUSCULAR | Status: DC | PRN
  Administered 2017-03-15: 10 mg via INTRAVENOUS

## 2017-03-15 MED ORDER — LIDOCAINE HCL (PF) 2 % IJ SOLN
INTRAMUSCULAR | Status: AC
  Filled 2017-03-15: qty 2

## 2017-03-15 MED ORDER — FAMOTIDINE 20 MG PO TABS: 20.0000 mg | ORAL_TABLET | Freq: Once | ORAL | Status: DC

## 2017-03-15 MED ORDER — EPHEDRINE SULFATE 50 MG/ML IJ SOLN
INTRAMUSCULAR | Status: AC
  Filled 2017-03-15: qty 1

## 2017-03-15 MED ORDER — ROCURONIUM BROMIDE 50 MG/5ML IV SOLN
INTRAVENOUS | Status: AC
  Filled 2017-03-15: qty 1

## 2017-03-15 MED ORDER — SULFAMETHOXAZOLE-TRIMETHOPRIM 200-40 MG/5ML PO SUSP: 20.0000 mL | Freq: Two times a day (BID) | ORAL | 0 refills | Status: DC

## 2017-03-15 MED ORDER — PHENYLEPHRINE HCL 10 MG/ML IJ SOLN
INTRAMUSCULAR | Status: AC
  Filled 2017-03-15: qty 1

## 2017-03-15 MED ORDER — ONDANSETRON HCL 4 MG/2ML IJ SOLN
INTRAMUSCULAR | Status: AC
  Filled 2017-03-15: qty 2

## 2017-03-15 MED ORDER — LACTATED RINGERS IV SOLN
INTRAVENOUS | Status: DC
  Administered 2017-03-15: 06:00:00 via INTRAVENOUS

## 2017-03-15 MED ORDER — FENTANYL CITRATE (PF) 100 MCG/2ML IJ SOLN
INTRAMUSCULAR | Status: AC
  Filled 2017-03-15: qty 2

## 2017-03-15 MED ORDER — SUGAMMADEX SODIUM 200 MG/2ML IV SOLN
INTRAVENOUS | Status: AC
  Filled 2017-03-15: qty 2

## 2017-03-15 MED ORDER — ONDANSETRON HCL 4 MG/2ML IJ SOLN
INTRAMUSCULAR | Status: DC | PRN
  Administered 2017-03-15: 4 mg via INTRAVENOUS

## 2017-03-15 MED ORDER — MIDAZOLAM HCL 2 MG/2ML IJ SOLN
INTRAMUSCULAR | Status: AC
  Filled 2017-03-15: qty 2

## 2017-03-15 MED ORDER — PROPOFOL 10 MG/ML IV BOLUS
INTRAVENOUS | Status: DC | PRN
  Administered 2017-03-15: 150 mg via INTRAVENOUS
  Administered 2017-03-15: 50 mg via INTRAVENOUS

## 2017-03-15 MED ORDER — MIDAZOLAM HCL 2 MG/2ML IJ SOLN
INTRAMUSCULAR | Status: DC | PRN
  Administered 2017-03-15: 2 mg via INTRAVENOUS

## 2017-03-15 SURGICAL SUPPLY — 23 items
BACTOSHIELD CHG 4% 4OZ (MISCELLANEOUS) ×1
BASKET ZERO TIP 1.9FR (BASKET) IMPLANT
CATH URETL 5X70 OPEN END (CATHETERS) ×3 IMPLANT
CNTNR SPEC 2.5X3XGRAD LEK (MISCELLANEOUS)
CONT SPEC 4OZ STER OR WHT (MISCELLANEOUS)
CONTAINER SPEC 2.5X3XGRAD LEK (MISCELLANEOUS) IMPLANT
FIBER LASER LITHO 273 (Laser) IMPLANT
GLOVE BIO SURGEON STRL SZ7 (GLOVE) ×3 IMPLANT
GLOVE BIO SURGEON STRL SZ7.5 (GLOVE) ×3 IMPLANT
GOWN STRL REUS W/ TWL LRG LVL4 (GOWN DISPOSABLE) ×2 IMPLANT
GOWN STRL REUS W/TWL LRG LVL4 (GOWN DISPOSABLE) ×1
GOWN STRL REUS W/TWL XL LVL3 (GOWN DISPOSABLE) ×3 IMPLANT
GUIDEWIRE SUPER STIFF (WIRE) IMPLANT
INTRODUCER DILATOR DOUBLE (INTRODUCER) ×3 IMPLANT
KIT RM TURNOVER CYSTO AR (KITS) ×3 IMPLANT
PACK CYSTO AR (MISCELLANEOUS) ×3 IMPLANT
SCRUB CHG 4% DYNA-HEX 4OZ (MISCELLANEOUS) ×2 IMPLANT
SENSORWIRE 0.038 NOT ANGLED (WIRE)
SET CYSTO W/LG BORE CLAMP LF (SET/KITS/TRAYS/PACK) ×3 IMPLANT
SHEATH URETERAL 13/15X36 1L (SHEATH) IMPLANT
SURGILUBE 2OZ TUBE FLIPTOP (MISCELLANEOUS) ×3 IMPLANT
SYRINGE IRR TOOMEY STRL 70CC (SYRINGE) ×3 IMPLANT
WIRE SENSOR 0.038 NOT ANGLED (WIRE) IMPLANT

## 2017-03-15 NOTE — Anesthesia Postprocedure Evaluation (Signed)
Anesthesia Post Note  Patient: Laura Sosa  Procedure(s) Performed: Procedure(s): CYSTOSCOPY/RETROGRADE/URETEROSCOPY  Patient location during evaluation: PACU Anesthesia Type: General Level of consciousness: awake and alert Pain management: pain level controlled Vital Signs Assessment: post-procedure vital signs reviewed and stable Respiratory status: spontaneous breathing, nonlabored ventilation, respiratory function stable and patient connected to nasal cannula oxygen Cardiovascular status: blood pressure returned to baseline and stable Postop Assessment: no signs of nausea or vomiting Anesthetic complications: no     Last Vitals:  Vitals:   03/15/17 0845 03/15/17 0908  BP: 109/73 107/80  Pulse: 82 78  Resp: 18   Temp: 36.7 C     Last Pain:  Vitals:   03/15/17 0908  TempSrc:   PainSc: 0-No pain                 Lenard SimmerAndrew Epifania Littrell

## 2017-03-15 NOTE — Telephone Encounter (Signed)
-----   Message from Hildred LaserBrian James Budzyn, MD sent at 03/15/2017  8:18 AM EDT ----- Patient passed her stone on URS today. I did not leave a stent. We can cancel her f/u appt. She can f/u prn at this point.

## 2017-03-15 NOTE — H&P (View-Only) (Signed)
03/06/2017 1:35 PM   Laura Sosa 02/10/1986 [id number]  Referring provider: Evelene CroonNiemeyer, Meindert, MD [address] [facility] [place], [place] [address]  Chief Complaint  Patient presents with  . New Patient (Initial Visit)    kidney stone referred by ER    HPI: The patient is a 31 year old female presents today for ER follow up of a right ureteral calculus. CT revealed a 4 mm (2.5 mm in width) distal right ureteral calculus as well as multiple bilateral nonobstructing stones no larger than 2 mm.  She has been to the emergency department for this 2 times. He was first diagnosed approximately 10 days ago. She has been straining her urine but has not caught the stone. She has intermittent nausea and vomiting. She has no fever or chills. Urine culture a few days ago was negative. She has not been taking Flomax as she cannot swallow pills. She is interested in surgery at this point as she is missing work from the pain.  PMH: No past medical history on file.  Surgical History: History reviewed. No pertinent surgical history.  Home Medications:  Allergies as of 03/06/2017   No Known Allergies     Medication List       Accurate as of 03/06/17  1:35 PM. Always use your most recent med list.          HYDROcodone-acetaminophen 7.5-325 mg/15 ml solution Commonly known as:  HYCET Take 10 mLs by mouth every 6 (six) hours as needed for severe pain.   ondansetron 4 MG disintegrating tablet Commonly known as:  ZOFRAN ODT Take 1 tablet (4 mg total) by mouth every 8 (eight) hours as needed for nausea or vomiting.   oxyCODONE-acetaminophen 5-325 MG/5ML solution Commonly known as:  ROXICET Take 5 mLs by mouth every 6 (six) hours as needed for severe pain.   rizatriptan 10 MG disintegrating tablet Commonly known as:  MAXALT-MLT Take 10 mg by mouth as needed for migraine. May repeat in 2 hours if needed   tamsulosin 0.4 MG Caps capsule Commonly known as:  FLOMAX Take 1 capsule (0.4 mg total)  by mouth daily.       Allergies: No Known Allergies  Family History: Family History  Problem Relation Age of Onset  . Kidney cancer Neg Hx   . Bladder Cancer Neg Hx     Social History:  reports that she has never smoked. She has never used smokeless tobacco. She reports that she does not drink alcohol or use drugs.  ROS: UROLOGY Frequent Urination?: No Hard to postpone urination?: No Burning/pain with urination?: No Get up at night to urinate?: No Leakage of urine?: No Urine stream starts and stops?: No Trouble starting stream?: No Do you have to strain to urinate?: No Blood in urine?: No Urinary tract infection?: No Sexually transmitted disease?: No Injury to kidneys or bladder?: No Painful intercourse?: No Weak stream?: Yes Currently pregnant?: No Vaginal bleeding?: No Last menstrual period?: n  Gastrointestinal Nausea?: Yes Vomiting?: Yes Indigestion/heartburn?: No Diarrhea?: No Constipation?: No  Constitutional Fever: No Night sweats?: No Weight loss?: No Fatigue?: No  Skin Skin rash/lesions?: No Itching?: No  Eyes Blurred vision?: No Double vision?: No  Ears/Nose/Throat Sore throat?: No Sinus problems?: No  Hematologic/Lymphatic Swollen glands?: No Easy bruising?: No  Cardiovascular Leg swelling?: No Chest pain?: No  Respiratory Cough?: No Shortness of breath?: No  Endocrine Excessive thirst?: No  Musculoskeletal Back pain?: Yes Joint pain?: No  Neurological Headaches?: Yes Dizziness?: No  Psychologic Depression?: Yes  Anxiety?: No  Physical Exam: BP 108/72   Pulse 81   Ht 5\' 3"  (1.6 m)   Wt 147 lb 8 oz (66.9 kg)   LMP 02/19/2017 (Exact Date) Comment: neg preg test  BMI 26.13 kg/m   Constitutional:  Alert and oriented, No acute distress. HEENT: [place] AT, moist mucus membranes.  Trachea midline, no masses. Cardiovascular: No clubbing, cyanosis, or edema. Respiratory: Normal respiratory effort, no increased work of  breathing. GI: Abdomen is soft, nontender, nondistended, no abdominal masses GU: No CVA tenderness.  Skin: No rashes, bruises or suspicious lesions. Lymph: No cervical or inguinal adenopathy. Neurologic: Grossly intact, no focal deficits, moving all 4 extremities. Psychiatric: Normal mood and affect.  Laboratory Data: Lab Results  Component Value Date   WBC 10.6 03/03/2017   HGB 14.2 03/03/2017   HCT 41.4 03/03/2017   MCV 87.7 03/03/2017   PLT 243 03/03/2017    Lab Results  Component Value Date   CREATININE 0.75 03/03/2017    No results found for: PSA  No results found for: TESTOSTERONE  No results found for: HGBA1C  Urinalysis    Component Value Date/Time   COLORURINE YELLOW (A) 03/03/2017 2329   APPEARANCEUR CLOUDY (A) 03/03/2017 2329   LABSPEC 1.018 03/03/2017 2329   PHURINE 7.0 03/03/2017 2329   GLUCOSEU NEGATIVE 03/03/2017 2329   HGBUR MODERATE (A) 03/03/2017 2329   BILIRUBINUR NEGATIVE 03/03/2017 2329   KETONESUR NEGATIVE 03/03/2017 2329   PROTEINUR 100 (A) 03/03/2017 2329   NITRITE NEGATIVE 03/03/2017 2329   LEUKOCYTESUR NEGATIVE 03/03/2017 2329    Pertinent Imaging: CT reviewed as above.  Assessment & Plan:    1. Right distal ureteral stone 2. Bilateral nonobstructing renal stones I discussed treatment options with the patient including medical expulsive therapy, lithotripsy, and ureteroscopy. At this point it has been 10 days since her stone was first diagnosed and she is continuing to miss work. She would like to schedule a surgical intervention at this point due to this. We discussed both lithotripsy and ureteroscopy. She understands the risks, benefits, indications of both. She is elected to undergo cystoscopy with right ureteroscopy, laser lithotripsy, and right ureteral stent. She'll continue to strain her urine in the interim. She understands the risks from the surgery include but are not limited to bleeding, infection, and need for repeat  procedures. She was scheduled for this in the near future. We also did discuss that her bilateral nonobstructing stones at this point are quite small, so we decided not to address them in this procedure.    Hildred Laser, MD  [facility] [address], [address] [place], [place] [address] [phone]

## 2017-03-15 NOTE — Anesthesia Preprocedure Evaluation (Signed)
Anesthesia Evaluation  Patient identified by MRN, date of birth, ID band Patient awake    Reviewed: Allergy & Precautions, H&P , NPO status , Patient's Chart, lab work & pertinent test results, reviewed documented beta blocker date and time   History of Anesthesia Complications Negative for: history of anesthetic complications  Airway Mallampati: I  TM Distance: >3 FB Neck ROM: full    Dental  (+) Dental Advidsory Given, Teeth Intact   Pulmonary neg pulmonary ROS,           Cardiovascular Exercise Tolerance: Good negative cardio ROS       Neuro/Psych  Headaches, neg Seizures negative psych ROS   GI/Hepatic negative GI ROS, Neg liver ROS,   Endo/Other  negative endocrine ROS  Renal/GU negative Renal ROS  negative genitourinary   Musculoskeletal   Abdominal   Peds  Hematology negative hematology ROS (+)   Anesthesia Other Findings Past Medical History: No date: Headache No date: History of kidney stones   Reproductive/Obstetrics negative OB ROS                             Anesthesia Physical Anesthesia Plan  ASA: I  Anesthesia Plan: General   Post-op Pain Management:    Induction: Intravenous  PONV Risk Score and Plan: 3 and Ondansetron, Dexamethasone and Midazolam  Airway Management Planned: LMA  Additional Equipment:   Intra-op Plan:   Post-operative Plan: Extubation in OR  Informed Consent: I have reviewed the patients History and Physical, chart, labs and discussed the procedure including the risks, benefits and alternatives for the proposed anesthesia with the patient or authorized representative who has indicated his/her understanding and acceptance.   Dental Advisory Given  Plan Discussed with: Anesthesiologist, CRNA and Surgeon  Anesthesia Plan Comments:         Anesthesia Quick Evaluation

## 2017-03-15 NOTE — Discharge Instructions (Signed)
  AMBULATORY SURGERY  DISCHARGE INSTRUCTIONS   1) The drugs that you were given will stay in your system until tomorrow so for the next 24 hours you should not:  A) Drive an automobile B) Make any legal decisions C) Drink any alcoholic beverage   2) You may resume regular meals tomorrow.  Today it is better to start with liquids and gradually work up to solid foods.  You may eat anything you prefer, but it is better to start with liquids, then soup and crackers, and gradually work up to solid foods.   3) Please notify your doctor immediately if you have any unusual bleeding, trouble breathing, redness and pain at the surgery site, drainage, fever, or pain not relieved by medication.    4) Additional Instructions: TAKE A STOOL SOFTENER TWICE A DAY WHILE TAKING NARCOTIC PAIN MEDICINE TO PREVENT CONSTIPATION   Please contact your physician with any problems or Same Day Surgery at [phone], Monday through Friday 6 am to 4 pm, or [place] [facility] [place] Main number at [phone].   

## 2017-03-15 NOTE — Interval H&P Note (Signed)
History and Physical Interval Note:  03/15/2017 7:26 AM  Laura Sosa  has presented today for surgery, with the diagnosis of RIGHT URETERAL STONE  The various methods of treatment have been discussed with the patient and family. After consideration of risks, benefits and other options for treatment, the patient has consented to  Procedure(s): CYSTOSCOPY/URETEROSCOPY/HOLMIUM LASER/STENT PLACEMENT (Right) as a surgical intervention .  The patient's history has been reviewed, patient examined, no change in status, stable for surgery.  I have reviewed the patient's chart and labs.  Questions were answered to the patient's satisfaction.    RRR Lungs clear  Hildred LaserBrian James Tanise Russman

## 2017-03-15 NOTE — Transfer of Care (Signed)
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  Procedure(s) Performed: Procedure(s): CYSTOSCOPY/URETEROSCOPY/HOLMIUM LASER/STENT PLACEMENT (Right)  Patient Location: PACU  Anesthesia Type:General  Level of Consciousness: drowsy  Airway & Oxygen Therapy: Patient Spontanous Breathing and Patient connected to nasal cannula oxygen  Post-op Assessment: Report given to RN and Post -op Vital signs reviewed and stable  Post vital signs: Reviewed and stable  Last Vitals:  Vitals:   03/15/17 0603 03/15/17 0811  BP: 107/76 109/70  Pulse:  92  Resp: 16 (!) 23  Temp:  37.2 C    Last Pain:  Vitals:   03/15/17 0811  TempSrc: Tympanic  PainSc: 0-No pain         Complications: No apparent anesthesia complications

## 2017-03-15 NOTE — Anesthesia Post-op Follow-up Note (Cosign Needed)
Anesthesia QCDR form completed.        

## 2017-03-15 NOTE — Anesthesia Procedure Notes (Signed)
Procedure Name: LMA Insertion Date/Time: 03/15/2017 7:44 AM Performed by: Marlana SalvageJESSUP, Temia Debroux Pre-anesthesia Checklist: Patient identified, Emergency Drugs available, Suction available, Patient being monitored and Timeout performed Patient Re-evaluated:Patient Re-evaluated prior to induction Oxygen Delivery Method: Circle system utilized Preoxygenation: Pre-oxygenation with 100% oxygen Induction Type: IV induction Ventilation: Mask ventilation without difficulty LMA: LMA inserted LMA Size: 4.0 Number of attempts: 1 Placement Confirmation: positive ETCO2 and breath sounds checked- equal and bilateral Tube secured with: Tape Dental Injury: Teeth and Oropharynx as per pre-operative assessment

## 2017-03-15 NOTE — Op Note (Signed)
Date of procedure: 03/15/17  Preoperative diagnosis:  1. Right ureteral stone  Postoperative diagnosis:  1. Passed right ureteral stone   Procedure: 1. Cystoscopy 2. Right ureteroscopy 3. Right retrograde pyelogram with interpretation  Surgeon: Baruch Gouty, MD  Anesthesia: General  Complications: None  Intraoperative findings: The patient's distal right ureteral stone had passed. It was not seen on right ureteroscopy. There is no hydronephrosis and good drainage on postdrainage films on retrograde pyelogram.  EBL: None  Specimens: None  Drains: None  Disposition: Stable to the postanesthesia care unit  Indication for procedure: The patient is a 31 y.o. female with history of 2.5 mm x 4 mm distal right ureteral calculus presents for definitive surgical management. Treatment straining her urine up until the last week but stopped due to her period. As of last night she was still experiencing right flank pain.  After reviewing the management options for treatment, the patient elected to proceed with the above surgical procedure(s). We have discussed the potential benefits and risks of the procedure, side effects of the proposed treatment, the likelihood of the patient achieving the goals of the procedure, and any potential problems that might occur during the procedure or recuperation. Informed consent has been obtained.  Description of procedure: The patient was met in the preoperative area. All risks, benefits, and indications of the procedure were described in great detail. The patient consented to the procedure. Preoperative antibiotics were given. The patient was taken to the operative theater. General anesthesia was induced per the anesthesia service. The patient was then placed in the dorsal lithotomy position and prepped and draped in the usual sterile fashion. A preoperative timeout was called.   A 20 French 30 cystoscope was inserted into the patient's bladder per urethra  atraumatically. The right ureteral orifice was somewhat erythematous suggesting a stone and possibly recently passed. A sensor wire was advanced level of the right renal pelvis under fluoroscopy. Semirigid ureteroscope was then assembled and inserted in the patient's right ureter ureter. The stone which was very close to the right UVJ on imaging was not seen in the distal mid or proximal ureter. Right retrograde pyelogram was then obtained which showed good drainage of the kidney. There is no filling defects. There is no hydronephrosis. There is a good ureteral jets seen within the urinary bladder of contrast. This point, term the stone had passed spontaneously on its own. The patient's bladder was drained and she was transferred stable condition to the postanesthesia care unit. No stent was left due to minimal manipulation of her right ureter.  Plan: The patient will follow with up [place] as needed as this was her first stone.  Baruch Gouty, M.D.

## 2017-03-18 ENCOUNTER — Telehealth: Payer: Self-pay | Admitting: Urology

## 2017-03-18 NOTE — Telephone Encounter (Signed)
Patient is calling today stating that she is in a lot of pain she is wanting to know what she should do? She said she still has pain meds. Can I add her on to your schedule or does it matter who she see's?   Thanks, Marcelino DusterMichelle

## 2017-03-18 NOTE — Progress Notes (Signed)
03/19/2017 11:05 AM   [place] R Frommer 05-Feb-1986 [id number]  Referring provider: Evelene Croon, MD [address] [place], [place] [address]  Chief Complaint  Patient presents with  . Back Pain    post op follow up kidney stones    HPI: 31 yo WF who presents today complaining of back pain.  Patient underwent a right URS on 03/15/2017 with Dr. Sherryl Barters for a right ureteral calculus.  It was not found during the procedure and presumed passed.  She also has multiple bilateral nonobstructing stones no larger than 2 mm.    She states that she has had bilateral flank pain since her procedure on 03/15/2017.  She rates the pain at a 9/10.  She described the pain as an ache.  She states the pain has been continuous since the surgery.  She states nothing makes the pain worse.  Applying a heating pad will help the pain.    She is only urinating twice daily since the surgery.  She states she is having adequate liquid intake.  Her PVR 0 mL.  No fevers, chills, nausea or vomiting.  No dysuria or gross hematuria only suprapubic pressure.   Her UA is positive for 11-30 WBC's and moderate bacteria.    PMH: Past Medical History:  Diagnosis Date  . Headache   . History of kidney stones     Surgical History: Past Surgical History:  Procedure Laterality Date  . CYSTOSCOPY/RETROGRADE/URETEROSCOPY  03/15/2017   Procedure: CYSTOSCOPY/RETROGRADE/URETEROSCOPY;  Surgeon: Hildred Laser, MD;  Location: [facility] ORS;  Service: Urology;;    Home Medications:  Allergies as of 03/19/2017   No Known Allergies     Medication List       Accurate as of 03/19/17 11:05 AM. Always use your most recent med list.          HYDROcodone-acetaminophen 7.5-325 mg/15 ml solution Commonly known as:  HYCET Take 10 mLs by mouth every 6 (six) hours as needed for severe pain.   ondansetron 4 MG disintegrating tablet Commonly known as:  ZOFRAN ODT Take 1 tablet (4 mg total) by mouth every 8 (eight) hours as  needed for nausea or vomiting.   rizatriptan 10 MG disintegrating tablet Commonly known as:  MAXALT-MLT Take 10 mg by mouth as needed for migraine. May repeat in 2 hours if needed   sulfamethoxazole-trimethoprim 200-40 MG/5ML suspension Commonly known as:  BACTRIM,SEPTRA Take 20 mLs by mouth 2 (two) times daily.       Allergies: No Known Allergies  Family History: Family History  Problem Relation Age of Onset  . Kidney cancer Neg Hx   . Bladder Cancer Neg Hx     Social History:  reports that she has never smoked. She has never used smokeless tobacco. She reports that she does not drink alcohol or use drugs.  ROS: UROLOGY Frequent Urination?: No Hard to postpone urination?: No Burning/pain with urination?: No Get up at night to urinate?: No Leakage of urine?: No Urine stream starts and stops?: Yes Trouble starting stream?: No Do you have to strain to urinate?: No Blood in urine?: No Urinary tract infection?: No Sexually transmitted disease?: No Injury to kidneys or bladder?: No Painful intercourse?: No Weak stream?: No Currently pregnant?: No Vaginal bleeding?: No Last menstrual period?: n  Gastrointestinal Nausea?: No Vomiting?: No Indigestion/heartburn?: No Diarrhea?: No Constipation?: No  Constitutional Fever: No Night sweats?: No Weight loss?: No Fatigue?: No  Skin Skin rash/lesions?: No Itching?: No  Eyes Blurred vision?: No Double vision?:  No  Ears/Nose/Throat Sore throat?: No Sinus problems?: No  Hematologic/Lymphatic Swollen glands?: No Easy bruising?: No  Cardiovascular Leg swelling?: No Chest pain?: No  Respiratory Cough?: No Shortness of breath?: No  Endocrine Excessive thirst?: No  Musculoskeletal Back pain?: Yes Joint pain?: No  Neurological Headaches?: No Dizziness?: No  Psychologic Depression?: No Anxiety?: No  Physical Exam: BP 106/68   Pulse 96   Ht 5\' 3"  (1.6 m)   Wt 144 lb 12.8 oz (65.7 kg)   LMP  03/12/2017 Comment: neg preg test  BMI 25.65 kg/m   Constitutional:  Alert and oriented, No acute distress. HEENT: [place] AT, moist mucus membranes.  Trachea midline, no masses. Cardiovascular: No clubbing, cyanosis, or edema. Respiratory: Normal respiratory effort, no increased work of breathing. GI: Abdomen is soft, nontender, nondistended, no abdominal masses GU: No CVA tenderness.  Skin: No rashes, bruises or suspicious lesions. Lymph: No cervical or inguinal adenopathy. Neurologic: Grossly intact, no focal deficits, moving all 4 extremities. Psychiatric: Normal mood and affect.  Laboratory Data: Lab Results  Component Value Date   WBC 10.6 03/03/2017   HGB 14.2 03/03/2017   HCT 41.4 03/03/2017   MCV 87.7 03/03/2017   PLT 243 03/03/2017    Lab Results  Component Value Date   CREATININE 0.75 03/03/2017    Urinalysis 11-30 WBC's.  Moderate bacteria.  See EPIC.    I have reviewed the labs  Pertinent Imaging: CLINICAL DATA:  31 year old female with history of right-sided flank pain. History of kidney stones.  EXAM: CT ABDOMEN AND PELVIS WITHOUT CONTRAST  TECHNIQUE: Multidetector CT imaging of the abdomen and pelvis was performed following the standard protocol without IV contrast.  COMPARISON:  CT the abdomen and pelvis 02/24/2017.  FINDINGS: Lower chest: Unremarkable.  Hepatobiliary: No definite cystic or solid hepatic lesions are noted in the visualized portions of the liver (upper half of the liver is incompletely visualized) on today's noncontrast CT examination. Unenhanced appearance of the gallbladder is normal.  Pancreas: No definite pancreatic mass or peripancreatic inflammatory changes are noted on today's noncontrast CT examination.  Spleen: Visualized portions of the spleen (superior aspect of the spleen is incompletely visualized) are unremarkable on today's noncontrast CT examination.  Adrenals/Urinary Tract: 2 tiny nonobstructive calculi  are noted within the upper pole collecting system of the left kidney measuring up to 3 mm. No additional calculi are noted within the right renal collecting system, along the course of either ureter, or within the lumen of the urinary bladder. There is no hydroureteronephrosis or perinephric stranding to indicate urinary tract obstruction at this time. Unenhanced appearance of the kidneys is otherwise normal. Urinary bladder is normal in appearance. Bilateral adrenal glands are normal in appearance.  Stomach/Bowel: Unenhanced appearance of the visualized stomach is normal. There is no pathologic dilatation of small bowel or colon. The appendix is not confidently identified and may be surgically absent. Regardless, there are no inflammatory changes noted adjacent to the cecum to suggest the presence of an acute appendicitis at this time.  Vascular/Lymphatic: No atherosclerotic calcifications or definite aneurysm identified in the abdominal or pelvic vasculature on today's noncontrast CT examination. No lymphadenopathy noted in the abdomen or pelvis.  Reproductive: Uterus and ovaries are unremarkable in appearance.  Other: Trace volume of free fluid in the cul-de-sac, presumably physiologic in this young female patient. No larger volume of ascites. No pneumoperitoneum.  Musculoskeletal: There are no aggressive appearing lytic or blastic lesions noted in the visualized portions of the skeleton.  IMPRESSION: 1.  No acute findings are noted on today's examination to account for the patient's symptoms. 2. Previously noted obstructing distal right ureteral stone is no longer visualized. Previously noted hydroureteronephrosis has resolved. 3. Two tiny nonobstructive calculi are noted within the upper pole collecting system of left kidney measuring 3 mm or less in size. 4. Trace volume of free fluid in the cul-de-sac, presumably physiologic in this young female  patient.   Electronically Signed   By: Trudie Reed M.D.   On: 03/19/2017 13:06  Assessment & Plan:    1. Right distal ureteral stone  - assumed passed as not seen during URS  - obtain STAT CT renal stone protocol - right ureteral stone has passed   2. Bilateral nonobstructing renal stones  - see above  - bilateral nephrolithiasis stable per CT  3. Bilateral flank pain  - see above  - no due to nephrolithiasis  - will check UA for infection - patient will be contacted to RTC for UA  Michiel Cowboy, [facility] [address], [address] [place], [place] [address] [phone]

## 2017-03-19 ENCOUNTER — Other Ambulatory Visit
Admission: RE | Admit: 2017-03-19 | Discharge: 2017-03-19 | Disposition: A | Discharge status: Home / Self Care | Payer: Medicaid Other | Source: Ambulatory Visit | Attending: Urology | Admitting: Urology

## 2017-03-19 ENCOUNTER — Ambulatory Visit
Admission: RE | Admit: 2017-03-19 | Discharge: 2017-03-19 | Disposition: A | Discharge status: Home / Self Care | Payer: Medicaid Other | Source: Ambulatory Visit | Attending: Urology | Admitting: Urology

## 2017-03-19 ENCOUNTER — Other Ambulatory Visit: Payer: Self-pay | Admitting: Urology

## 2017-03-19 ENCOUNTER — Ambulatory Visit (INDEPENDENT_AMBULATORY_CARE_PROVIDER_SITE_OTHER): Payer: Medicaid Other | Admitting: Urology

## 2017-03-19 ENCOUNTER — Telehealth: Payer: Self-pay

## 2017-03-19 ENCOUNTER — Encounter: Payer: Self-pay | Admitting: Urology

## 2017-03-19 VITALS — BP 106/68 | HR 96 | Ht 63.0 in | Wt 144.8 lb

## 2017-03-19 DIAGNOSIS — N201 Calculus of ureter: Secondary | ICD-10-CM

## 2017-03-19 DIAGNOSIS — R109 Unspecified abdominal pain: Secondary | ICD-10-CM

## 2017-03-19 DIAGNOSIS — M549 Dorsalgia, unspecified: Secondary | ICD-10-CM

## 2017-03-19 DIAGNOSIS — N200 Calculus of kidney: Secondary | ICD-10-CM

## 2017-03-19 LAB — MICROSCOPIC EXAMINATION

## 2017-03-19 LAB — URINALYSIS, COMPLETE
Bilirubin, UA: NEGATIVE
Glucose, UA: NEGATIVE
Nitrite, UA: NEGATIVE
PH UA: 7 (ref 5.0–7.5)
SPEC GRAV UA: 1.02 (ref 1.005–1.030)
Urobilinogen, Ur: >8 mg/dL — ABNORMAL HIGH (ref 0.2–1.0)

## 2017-03-19 LAB — PREGNANCY, URINE: Preg Test, Ur: NEGATIVE

## 2017-03-19 LAB — BLADDER SCAN AMB NON-IMAGING: Scan Result: 0

## 2017-03-19 NOTE — Telephone Encounter (Signed)
-----   Message from Laura BattiestShannon A McGowan, PA-C sent at 03/19/2017  2:32 PM EDT ----- Please let Laura Sosa know that she does not have any ureteral stones or signs of kidney blockage.  We need her to provide a urine for culture.

## 2017-03-19 NOTE — Telephone Encounter (Addendum)
Called pt. No answer. Will try later. Left  vmail per DPR.

## 2017-03-20 ENCOUNTER — Other Ambulatory Visit: Payer: Medicaid Other

## 2017-03-22 ENCOUNTER — Other Ambulatory Visit: Payer: Medicaid Other

## 2017-03-25 ENCOUNTER — Telehealth: Payer: Self-pay

## 2017-03-25 NOTE — Telephone Encounter (Signed)
Pt called stating she was seen last week and was under the impression her urine was going to be sent for culture. I am not seeing any urine culture orders in the computer. Please advise.

## 2017-03-26 NOTE — Telephone Encounter (Signed)
Laura Sosa is correct.  It was to be sent for culture.  Please apologize to the patient and ask her to drop off another urine for culture.

## 2017-03-26 NOTE — Telephone Encounter (Signed)
Spoke with pt in reference to needing another urine for cx. Pt stated that she was in so much pain yesterday she went to see her PCP who sent urine for cx and gave her amoxicillin. Pt stated that she would have ucx results sent over from PCP when they return.

## 2017-06-06 ENCOUNTER — Encounter: Payer: Self-pay | Admitting: Certified Nurse Midwife

## 2017-06-14 ENCOUNTER — Encounter: Payer: Self-pay | Admitting: Certified Nurse Midwife

## 2017-06-21 ENCOUNTER — Ambulatory Visit (INDEPENDENT_AMBULATORY_CARE_PROVIDER_SITE_OTHER): Payer: Medicaid Other | Admitting: Certified Nurse Midwife

## 2017-06-21 ENCOUNTER — Encounter: Payer: Self-pay | Admitting: Certified Nurse Midwife

## 2017-06-21 VITALS — BP 98/71 | HR 69 | Ht 63.0 in | Wt 149.5 lb

## 2017-06-21 DIAGNOSIS — N979 Female infertility, unspecified: Secondary | ICD-10-CM | POA: Diagnosis not present

## 2017-06-21 NOTE — Patient Instructions (Signed)
Infertility  Infertility is when you are unable to get pregnant (conceive) after a year of having sex regularly without using birth control. Infertility can also mean that a woman is not able to carry a pregnancy to full term.  Both women and men can have fertility problems.  What causes infertility?  What Causes Infertility in Women?  There are many possible causes of infertility in women. For some women, the cause of infertility is not known (unexplained infertility). Infertility can also be linked to more than one cause. Infertility problems in women can be caused by problems with the menstrual cycle or reproductive organs, certain medical conditions, and factors related to lifestyle and age.   Problems with your menstrual cycle can interfere with your ovaries producing eggs (ovulation). This can make it difficult to get pregnant. This includes having a menstrual cycle that is very long, very short, or irregular.   Problems with reproductive organs can include:  ? An abnormally narrow cervix or a cervix that does not remain closed during a pregnancy.  ? A blockage in your fallopian tubes.  ? An abnormally shaped uterus.  ? Uterine fibroids. This is a tissue mass (tumor) that can develop on your uterus.   Medical conditions that can affect a woman's fertility include:  ? Polycystic ovarian syndrome (PCOS). This is a hormonal disorder that can cause small cysts to grow on your ovaries. This is the most common cause of infertility in women.  ? Endometriosis. This is a condition in which the tissue that lines your uterus (endometrium) grows outside of its normal location.  ? Primary ovary insufficiency. This is when your ovaries stop producing eggs and hormones before the age of 40.  ? Sexually transmitted diseases, such as chlamydia or gonorrhea. These infections can cause scarring in your fallopian tubes. This makes it difficult for eggs to reach your uterus.  ? Autoimmune disorders. These are disorders in which  your immune system attacks normal, healthy cells.  ? Hormone imbalances.   Other factors include:  ? Age. A woman's fertility declines with age, especially after her mid-30s.  ? Being under- or overweight.  ? Drinking too much alcohol.  ? Using drugs.  ? Exercising excessively.  ? Being exposed to environmental toxins, such as radiation, pesticides, and certain chemicals.    What Causes Infertility in Men?  There are many causes of infertility in men. Infertility can be linked to more than one cause. Infertility problems in men can be caused by problems with sperm or the reproductive organs, certain medical conditions, and factors related to lifestyle and age. Some men have unexplained infertility.   Problems with sperm. Infertility can result if there is a problem producing:  ? Enough sperm (low sperm count).  ? Enough normally-shaped sperm (sperm morphology).  ? Sperm that are able to reach the egg (poor motility).   Infertility can also be caused by:  ? A problem with hormones.  ? Enlarged veins (varicoceles), cysts (spermatoceles), or tumors of the testicles.  ? Sexual dysfunction.  ? Injury to the testicles.  ? A birth defect, such as not having the tubes that carry sperm (vas deferens).   Medical conditions that can affect a man's fertility include:  ? Diabetes.  ? Cancer treatments, such as chemotherapy or radiation.  ? Klinefelter syndrome. This is an inherited genetic disorder.  ? Thyroid problems, such as an under- or overactive thyroid.  ? Cystic fibrosis.  ? Sexually transmitted diseases.   Other factors   include:  ? Age. A man's fertility declines with age.  ? Drinking too much alcohol.  ? Using drugs.  ? Being exposed to environmental toxins, such as pesticides and lead.    What are the symptoms of infertility?  Being unable to get pregnant after one year of having regular sex without using birth control is the only sign of infertility.  How is infertility diagnosed?  In order to be diagnosed with  infertility, both partners will have a physical exam. Both partners will also have an extensive medical and sexual history taken. If there is no obvious reason for infertility, additional tests may be done.  What Tests Will Women Have?  Women may first have tests to check whether they are ovulating each month. The tests may include:   Blood tests to check hormone levels.   An ultrasound of the ovaries. This looks for possible problems on or in the ovaries.   Taking a small sample of the tissue that lines the uterus for examination under a microscope (endometrial biopsy).    Women who are ovulating may have additional tests. These may include:   Hysterosalpingography.  ? This is an X-ray of the fallopian tubes and uterus taken after a specific type of dye is injected.  ? This test can show the shape of the uterus and whether the fallopian tubes are open.   Laparoscopy.  ? In this test, a lighted tube (laparoscope) is used to look for problems in the fallopian tubes and other female organs.   Transvaginal ultrasound.  ? This is an imaging test to check for abnormalities of the uterus and ovaries.  ? A health care provider can use this test to count the number of follicles on the ovaries.   Hysteroscopy.  ? This test involves using a lighted tube to examine the cervix and inside the uterus.  ? It is done to find any abnormalities inside the uterus.    What Tests Will Men Have?  Tests for men's infertility includes:   Semen tests to check sperm count, morphology, and motility.   Blood tests to check for hormone levels.   Taking a small sample of tissue from inside a testicle (biopsy). This is examined under a microscope.   Blood tests to check for genetic abnormalities (genetic testing).    How are women treated for infertility?  Treatment depends on the cause of infertility. Most cases of infertility in women are treated with medicine or surgery.   Women may take medicine to:  ? Correct ovulation  problems.  ? Treat other health conditions, such as PCOS.   Surgery may be done to:  ? Repair damage to the ovaries, fallopian tubes, cervix, or uterus.  ? Remove growths from the uterus.  ? Remove scar tissue from the uterus, pelvis, or other female organs.    How are men treated for infertility?  Treatment depends on the cause of infertility. Most cases of infertility in men are treated with medicine or surgery.   Men may take medicine to:  ? Correct hormone problems.  ? Treat other health conditions.  ? Treat sexual dysfunction.   Surgery may be done to:  ? Remove blockages in the reproductive tract.  ? Correct other structural problems of the reproductive tract.    What is assisted reproductive technology?  Assisted reproductive technology (ART) refers to all treatments and procedures that combine eggs and sperm outside the body to try to help a couple conceive. ART is often   combined with fertility drugs to stimulate ovulation. Sometimes ART is done using eggs retrieved from another woman's body (donor eggs) or from previously frozen fertilized eggs (embryos).  There are different types of ART. These include:   Intrauterine insemination (IUI).  ? In this procedure, sperm is placed directly into a woman's uterus with a long, thin tube.  ? This may be most effective for infertility caused by sperm problems, including low sperm count and low motility.  ? Can be used in combination with fertility drugs.   In vitro fertilization (IVF).  ? This is often done when a woman's fallopian tubes are blocked or when a man has low sperm counts.  ? Fertility drugs stimulate the ovaries to produce multiple eggs. Once mature, these eggs are removed from the body and combined with the sperm to be fertilized.  ? These fertilized eggs are then placed in the woman's uterus.    This information is not intended to replace advice given to you by your health care provider. Make sure you discuss any questions you have with your  health care provider.  Document Released: 08/16/2003 Document Revised: 01/13/2016 Document Reviewed: 04/28/2014  Elsevier Interactive Patient Education  2018 [employer]

## 2017-06-22 LAB — FSH/LH
FSH: 4.6 m[IU]/mL
LH: 3.4 m[IU]/mL

## 2017-06-22 LAB — BETA HCG QUANT (REF LAB): hCG Quant: <1 m[IU]/mL

## 2017-06-22 LAB — TSH: TSH: 2.01 u[IU]/mL (ref 0.450–4.500)

## 2017-06-22 LAB — TESTOSTERONE, FREE, TOTAL, SHBG
Sex Hormone Binding: 51.4 nmol/L (ref 24.6–122.0)
TESTOSTERONE: 15 ng/dL (ref 8–48)
Testosterone, Free: 1.8 pg/mL (ref 0.0–4.2)

## 2017-06-22 LAB — PROLACTIN: Prolactin: 12.1 ng/mL (ref 4.8–23.3)

## 2017-06-29 NOTE — Progress Notes (Signed)
GYN ENCOUNTER NOTE  Subjective:       Laura Sosa is a 31 y.o. G76P1001 female is here for gynecologic evaluation of the following issues:  1. Infertility.   Pt has been off Depo for the last two (2) years and has not conceived after eleven months of "trying". Was previously on Depo for five (5) years and it "took six (6) months to conceive".   Denies difficulty breathing or respiratory distress, chest pain, abdominal pain, dysuria, and leg pain or swelling.    Gynecologic History  Period Cycle (Days): 21 Period Duration (Days): two (2) to five (5) Period Pattern: (!) Irregular Menstrual Flow: Heavy, Light Menstrual Control: Maxi pad, Tampon Menstrual Control Change Freq (Hours): four (4) to six (6) Dysmenorrhea: None  Patient's last menstrual period was 06/17/2017.   Contraception: none  Last Pap: 04/2017. Results were: normal  Obstetric History OB History  Gravida Para Term Preterm AB Living  1 1 1     1   SAB TAB Ectopic Multiple Live Births          1    # Outcome Date GA Lbr Len/2nd Weight Sex Delivery Anes PTL Lv  1 Term    8 lb 6 oz (3.799 kg) F Vag-Spont   LIV      Past Medical History:  Diagnosis Date  . Headache   . History of kidney stones     Past Surgical History:  Procedure Laterality Date  . CYSTOSCOPY/RETROGRADE/URETEROSCOPY  03/15/2017   Procedure: CYSTOSCOPY/RETROGRADE/URETEROSCOPY;  Surgeon: Hildred Laser, MD;  Location: [facility] ORS;  Service: Urology;;    Current Outpatient Prescriptions on File Prior to Visit  Medication Sig Dispense Refill  . HYDROcodone-acetaminophen (HYCET) 7.5-325 mg/15 ml solution Take 10 mLs by mouth every 6 (six) hours as needed for severe pain. (Patient not taking: Reported on 06/21/2017) 118 mL 0   No current facility-administered medications on file prior to visit.     No Known Allergies  Social History   Social History  . Marital status: Single    Spouse name: N/A  . Number of children: N/A  .  Years of education: N/A   Occupational History  . Not on file.   Social History Main Topics  . Smoking status: Never Smoker  . Smokeless tobacco: Never Used  . Alcohol use No  . Drug use: No  . Sexual activity: Yes   Other Topics Concern  . Not on file   Social History Narrative  . No narrative on file    Family History  Problem Relation Age of Onset  . Cancer Maternal Grandmother   . Kidney cancer Neg Hx   . Bladder Cancer Neg Hx     The following portions of the patient's history were reviewed and updated as appropriate: allergies, current medications, past family history, past medical history, past social history, past surgical history and problem list.  Review of Systems  Review of Systems - Negative except as noted above.  History obtained from the patient  Objective:   BP 98/71 (BP Location: Right Arm, Patient Position: Sitting, Cuff Size: Normal)   Pulse 69   Ht 5\' 3"  (1.6 m)   Wt 149 lb 8 oz (67.8 kg)   LMP 06/17/2017 Comment: neg preg test  BMI 26.48 kg/m   Alert and oriented x 4, no apparent distress.   Physical exam: not indicated.   Assessment:   1. Infertility, female - FSH/LH - TSH - Prolactin - Testosterone, Free, Total, SHBG -  Beta HCG, Quant   Plan:   Labs: see orders.   Discussed ovulation tracking, well time sex-once every other day during fertility period, and other home treatment measures.   Advised bone scan for history of prolonged Depo use, pt verbalized understanding.   Reviewed red flag symptoms and when to call.   RTC as needed.    Gunnar BullaJenkins Michelle Velma Agnes, CNM

## 2017-08-27 NOTE — L&D Delivery Note (Addendum)
       Delivery Note   Laura Sosa is a 32 y.o. G2P1001 at [redacted]w[redacted]d Estimated Date of Delivery: 06/29/18  PRE-OPERATIVE DIAGNOSIS:  1) [redacted]w[redacted]d pregnancy.  2) PIH 3) Oligo 4) 2 ves cord  POST-OPERATIVE DIAGNOSIS:  1) [redacted]w[redacted]d pregnancy s/p Vaginal, Spontaneous  Same with viable infant  Delivery Type: Vaginal, Spontaneous    Delivery Anesthesia: Epidural   Labor Complications:       ESTIMATED BLOOD LOSS: 20  ml    FINDINGS:   1) female infant, Apgar scores of    8 at 1 minute and     9 at 5 minutes and a birthweight of    ounces.    2) Nuchal cord: No  SPECIMENS:   PLACENTA:   Appearance: Intact    Removal: Spontaneous      Disposition:     DISPOSITION:  Infant to left in stable condition in the delivery room, with L&D personnel and mother,  NARRATIVE SUMMARY: Labor course:  [name] is a G2P1001 at [redacted]w[redacted]d who presented for induction of labor.  She received 2 doses of vaginal misoprostol and had AROM.  She progressed well in labor without pitocin.  She received the appropriate anesthesia and proceeded to complete dilation. She evidenced good maternal expulsive effort during the second stage. She went on to deliver a viable infant. The placenta delivered without problems and was noted to be complete. A perineal and vaginal examination was performed. Episiotomy/Lacerations: None   Elonda Husky, M.D. 06/17/2018 8:46 PM

## 2017-11-08 ENCOUNTER — Encounter: Payer: Self-pay | Admitting: Emergency Medicine

## 2017-11-08 ENCOUNTER — Emergency Department
Admission: EM | Admit: 2017-11-08 | Discharge: 2017-11-08 | Disposition: A | Discharge status: Home / Self Care | Payer: Medicaid Other | Attending: Emergency Medicine | Admitting: Emergency Medicine

## 2017-11-08 ENCOUNTER — Other Ambulatory Visit: Payer: Self-pay

## 2017-11-08 DIAGNOSIS — Z3A01 Less than 8 weeks gestation of pregnancy: Secondary | ICD-10-CM | POA: Diagnosis not present

## 2017-11-08 DIAGNOSIS — O210 Mild hyperemesis gravidarum: Secondary | ICD-10-CM | POA: Diagnosis not present

## 2017-11-08 LAB — COMPREHENSIVE METABOLIC PANEL
ALK PHOS: 66 U/L (ref 38–126)
ALT: 21 U/L (ref 14–54)
ANION GAP: 10 (ref 5–15)
AST: 20 U/L (ref 15–41)
Albumin: 4.3 g/dL (ref 3.5–5.0)
BILIRUBIN TOTAL: 0.9 mg/dL (ref 0.3–1.2)
BUN: 11 mg/dL (ref 6–20)
CALCIUM: 9.2 mg/dL (ref 8.9–10.3)
CO2: 21 mmol/L — ABNORMAL LOW (ref 22–32)
CREATININE: 0.73 mg/dL (ref 0.44–1.00)
Chloride: 103 mmol/L (ref 101–111)
GFR calc Af Amer: >60 mL/min (ref >60)
GFR calc non Af Amer: >60 mL/min (ref >60)
Glucose, Bld: 91 mg/dL (ref 65–99)
Potassium: 3.8 mmol/L (ref 3.5–5.1)
Sodium: 134 mmol/L — ABNORMAL LOW (ref 135–145)
TOTAL PROTEIN: 7.6 g/dL (ref 6.5–8.1)

## 2017-11-08 LAB — URINALYSIS, COMPLETE (UACMP) WITH MICROSCOPIC
Bacteria, UA: NONE SEEN
Bilirubin Urine: NEGATIVE
GLUCOSE, UA: NEGATIVE mg/dL
Hgb urine dipstick: NEGATIVE
Ketones, ur: 5 mg/dL — AB
Leukocytes, UA: NEGATIVE
NITRITE: NEGATIVE
PROTEIN: NEGATIVE mg/dL
Specific Gravity, Urine: 1.023 (ref 1.005–1.030)
pH: 6 (ref 5.0–8.0)

## 2017-11-08 LAB — CBC
HCT: 41.7 % (ref 35.0–47.0)
HEMOGLOBIN: 14.1 g/dL (ref 12.0–16.0)
MCH: 30.4 pg (ref 26.0–34.0)
MCHC: 33.9 g/dL (ref 32.0–36.0)
MCV: 89.7 fL (ref 80.0–100.0)
Platelets: 281 10*3/uL (ref 150–440)
RBC: 4.65 MIL/uL (ref 3.80–5.20)
RDW: 12.3 % (ref 11.5–14.5)
WBC: 9.4 10*3/uL (ref 3.6–11.0)

## 2017-11-08 LAB — LIPASE, BLOOD: Lipase: 27 U/L (ref 11–51)

## 2017-11-08 LAB — HCG, QUANTITATIVE, PREGNANCY: hCG, Beta Chain, Quant, S: 102152 m[IU]/mL — ABNORMAL HIGH (ref <5)

## 2017-11-08 MED ORDER — DOXYLAMINE-PYRIDOXINE 10-10 MG PO TBEC: 2.0000 | DELAYED_RELEASE_TABLET | Freq: Every day | ORAL | 0 refills | Status: DC

## 2017-11-08 MED ORDER — SODIUM CHLORIDE 0.9 % IV BOLUS (SEPSIS)
1000.0000 mL | Freq: Once | INTRAVENOUS | Status: AC
  Administered 2017-11-08: 1000 mL via INTRAVENOUS

## 2017-11-08 MED ORDER — PROMETHAZINE HCL 25 MG/ML IJ SOLN
12.5000 mg | Freq: Once | INTRAMUSCULAR | Status: AC
  Administered 2017-11-08: 12.5 mg via INTRAVENOUS
  Filled 2017-11-08: qty 1

## 2017-11-08 MED ORDER — PROMETHAZINE HCL 25 MG/ML IJ SOLN: 12.5000 mg | Freq: Once | INTRAMUSCULAR | Status: DC

## 2017-11-08 NOTE — ED Notes (Signed)
Informed RN that patient has been roomed and is ready for evaluation.  Patient in NAD at this time and call bell placed within reach.   

## 2017-11-08 NOTE — ED Notes (Signed)
Lab notified to add on hCG. 

## 2017-11-08 NOTE — ED Triage Notes (Signed)
Pt to ED via POV, pt states that she has not hardly ate anything in the past 2 weeks and that she had vomited 7 times in the last 24 hours. Pt states that she is approximately [redacted] weeks pregnant. Pt is G2P1. Pt denies abdominal pain or vaginal bleeding. Pt in NAD at this time.

## 2017-11-08 NOTE — Discharge Instructions (Signed)
? ?  Please return to the emergency room right away if you are to develop a fever, severe nausea, your pain becomes severe or worsens, you are unable to keep food down, begin vomiting any dark or bloody fluid, you develop any dark or bloody stools, feel dehydrated, or other new concerns or symptoms arise. ? ?

## 2017-11-08 NOTE — ED Notes (Signed)
Signature pad not working at time of discharge; pt expresses verbal understanding of discharge paper work with no further questions at this time.   

## 2017-11-08 NOTE — ED Provider Notes (Signed)
[facility] Emergency Department Provider Note   ____________________________________________   First MD Initiated Contact with Patient 11/08/17 1516     (approximate)  I have reviewed the triage vital signs and the nursing notes.   HISTORY  Chief Complaint Emesis    HPI Laura Sosa is a 32 y.o. 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  Patient reports that she has had severe nausea and vomiting, has been persistent for about the last 3-4 weeks.  She will occasionally be able to eat, keep down some fluids, but will often vomit up to 5-10 times a day.  Reports vomiting up food, liquid, acid.  Not having any abdominal pain.  No fevers or chills.  No pain or burning with urination.  No vaginal bleeding or vaginal discharge.  Reports these are the same symptoms she experienced with previous pregnancy due to severe morning sickness.  She has appointment with OB coming up in about 1 week.  Patient reports she thinks she is dehydrated, feels that she needs to get a bag or 2 of fluid.  Reports this is helped in the past and she would like to avoid taking any nausea medicines except anything that might be proven possibly safe during pregnancy.  She does not wish to take Zofran.  Past Medical History:  Diagnosis Date  . Headache   . History of kidney stones     Patient Active Problem List   Diagnosis Date Noted  . Pyelonephritis 04/17/2016  . Sepsis secondary to UTI (HCC) 04/16/2016    Past Surgical History:  Procedure Laterality Date  . CYSTOSCOPY/RETROGRADE/URETEROSCOPY  03/15/2017   Procedure: CYSTOSCOPY/RETROGRADE/URETEROSCOPY;  Surgeon: Hildred Laser, MD;  Location: [facility] ORS;  Service: Urology;;     Prior to Admission medications   Medication Sig Start Date End Date Taking? Authorizing Provider  Doxylamine-Pyridoxine 10-10 MG TBEC Take 2 tablets by mouth at bedtime. 11/08/17   Sharyn Creamer, MD    Allergies Patient has no known allergies.  Family History  Problem Relation Age of Onset  . Cancer Maternal Grandmother   . Kidney cancer Neg Hx   . Bladder Cancer Neg Hx     Social History Social History   Tobacco Use  . Smoking status: Never Smoker  . Smokeless tobacco: Never Used  Substance Use Topics  . Alcohol use: No  . Drug use: No    Review of Systems Constitutional: No fever/chills Eyes: No visual changes. ENT: No sore throat. Cardiovascular: Denies chest pain. Respiratory: Denies shortness of breath. Gastrointestinal: No abdominal pain.  No diarrhea.  No constipation. Genitourinary: Negative for dysuria. Musculoskeletal: Negative for back pain. Skin: Negative for rash. Neurological: Negative for headaches, focal weakness or numbness.    ____________________________________________   PHYSICAL EXAM:  VITAL SIGNS: ED Triage Vitals [11/08/17 1342]  Enc Vitals Group     BP 114/72     Pulse Rate 73     Resp 16     Temp 98.2 F (36.8 C)     Temp Source Oral     SpO2 99 %     Weight 149 lb (67.6 kg)     Height 5\' 3"  (1.6 m)     Head Circumference      Peak Flow  Pain Score      Pain Loc      Pain Edu?      Excl. in GC?     Constitutional: Alert and oriented. Well appearing and in no acute distress. Eyes: Conjunctivae are normal. Head: Atraumatic. Nose: No congestion/rhinnorhea. Mouth/Throat: Mucous membranes are moist. Neck: No stridor.   Cardiovascular: Normal rate, regular rhythm. Grossly normal heart sounds.  Good peripheral circulation. Respiratory: Normal respiratory effort.  No retractions. Lungs CTAB. Gastrointestinal: Soft and nontender. No distention.  No rebound or guarding.  Not obviously gravid. Musculoskeletal: No lower  extremity tenderness nor edema. Neurologic:  Normal speech and language. No gross focal neurologic deficits are appreciated.  Skin:  Skin is warm, dry and intact. No rash noted. Psychiatric: Mood and affect are normal. Speech and behavior are normal.  ____________________________________________   LABS (all labs ordered are listed, but only abnormal results are displayed)  Labs Reviewed  COMPREHENSIVE METABOLIC PANEL - Abnormal; Notable for the following components:      Result Value   Sodium 134 (*)    CO2 21 (*)    All other components within normal limits  URINALYSIS, COMPLETE (UACMP) WITH MICROSCOPIC - Abnormal; Notable for the following components:   Color, Urine YELLOW (*)    APPearance HAZY (*)    Ketones, ur 5 (*)    Squamous Epithelial / LPF 6-30 (*)    All other components within normal limits  HCG, QUANTITATIVE, PREGNANCY - Abnormal; Notable for the following components:   hCG, Beta Chain, Quant, S 102,152 (*)    All other components within normal limits  LIPASE, BLOOD  CBC   ____________________________________________  EKG   ____________________________________________  RADIOLOGY   ____________________________________________   PROCEDURES  Procedure(s) performed: None  Procedures  Critical Care performed: No  ____________________________________________   INITIAL IMPRESSION / ASSESSMENT AND PLAN / ED COURSE  Pertinent labs & imaging results that were available during my care of the patient were reviewed by me and considered in my medical decision making (see chart for details).  Patient presents for evaluation of persistent nausea for the last few weeks of pregnancy, also frequent emesis.  Reports a strong history of morning sickness.  Denies pain no pain on exam.  Stable hemodynamics and normal level of alertness.  Her lab work is reassuring, I suspect based on her exam, history, she is likely suffering from hyperemesis.  Discussed with her and  will hydrate her generously, reevaluate thereafter and also plan to provide her prescription for Diclegis which we discussed risks benefits of and need for close primary follow-up.  She has no fever, no elevation of her white count.  Very reassuring clinical examination without any abdominal pain or peritonitis.  No genitourinary symptoms.  No indication for emergent imaging.  ----------------------------------------- 5:52 PM on 11/08/2017 -----------------------------------------  Resting comfortably, reports her nausea is gone away.  She is feeling much better.  Comfortable with plan for discharge and close follow-up with [facility]. Return precautions and treatment recommendations and follow-up discussed with the patient who is agreeable with the plan.  Boyfriend driving her home      ____________________________________________   FINAL CLINICAL IMPRESSION(S) / ED DIAGNOSES  Final diagnoses:  Hyperemesis arising during pregnancy      NEW MEDICATIONS STARTED DURING THIS VISIT:  New Prescriptions   DOXYLAMINE-PYRIDOXINE 10-10 MG TBEC    Take 2 tablets by mouth at bedtime.     Note:  This document was prepared using Conservation officer, historic buildings and may  include unintentional dictation errors.     Sharyn CreamerQuale, Arnell Slivinski, MD 11/08/17 [phone]

## 2017-11-18 ENCOUNTER — Ambulatory Visit (INDEPENDENT_AMBULATORY_CARE_PROVIDER_SITE_OTHER): Payer: Medicaid Other | Admitting: Obstetrics and Gynecology

## 2017-11-18 VITALS — BP 89/61 | HR 78 | Ht 63.0 in | Wt 145.1 lb

## 2017-11-18 DIAGNOSIS — Z3481 Encounter for supervision of other normal pregnancy, first trimester: Secondary | ICD-10-CM

## 2017-11-18 DIAGNOSIS — O219 Vomiting of pregnancy, unspecified: Secondary | ICD-10-CM

## 2017-11-18 DIAGNOSIS — Z202 Contact with and (suspected) exposure to infections with a predominantly sexual mode of transmission: Secondary | ICD-10-CM

## 2017-11-18 NOTE — Progress Notes (Signed)
I have reviewed the record and concur with patient management and plan.  Viriginia Amendola, MD [facility]     

## 2017-11-18 NOTE — Patient Instructions (Signed)
Asymptomatic Bacteriuria Asymptomatic bacteriuria is the presence of a large number of bacteria in the urine without the usual symptoms of burning or frequent urination. What are the causes? This condition is caused by an increase in bacteria in the urine. This increase can be caused by:  Bacteria entering the urinary tract, such as during sex.  A blockage in the urinary tract, such as from kidney stones or a tumor.  Bladder problems that prevent the bladder from emptying.  What increases the risk? You are more likely to develop this condition if:  You have diabetes mellitus.  You are an elderly adult, especially if you are also in a long-term care facility.  You are pregnant and in the first trimester.  You have kidney stones.  You are female.  You have had a kidney transplant.  You have a leaky kidney tube valve (reflux).  You had a urinary catheter for a long period of time.  What are the signs or symptoms? There are no symptoms of this condition. How is this diagnosed? This condition is diagnosed with a urine test. Because this condition does not cause symptoms, it is usually diagnosed when a urine sample is taken to treat or diagnose another condition, such as pregnancy or kidney problems. Most women who are in their first trimester of pregnancy are screened for asymptomatic bacteriuria. How is this treated? Usually, treatment is not needed for this condition. Treating the condition can lead to other problems, such as a yeast infection or the growth of bacteria that do not respond to treatment (antibiotic-resistant bacteria). Some people, such as pregnant women and people with kidney transplants, do need treatment with antibiotic medicines to prevent kidney infection (pyelonephritis). In pregnant women, kidney infection can lead to premature labor, fetal growth restriction, or newborn death. Follow these instructions at home: Medicines  Take over-the-counter and  prescription medicines only as told by your health care provider.  If you were prescribed an antibiotic medicine, take it as told by your health care provider. Do not stop taking the antibiotic even if you start to feel better. General instructions  Monitor your condition for any changes.  Drink enough fluid to keep your urine clear or pale yellow.  Go to the bathroom more often to keep your bladder empty.  If you are female, keep the area around your vagina and rectum clean. Wipe yourself from front to back after urinating.  Keep all follow-up visits as told by your health care provider. This is important. Contact a health care provider if:  You notice any new symptoms, such as back pain or burning while urinating. Get help right away if:  You develop signs of an infection such as: ? A burning sensation when you urinate. ? Have pain when you urinate. ? Develop an intense need to urinate. ? Urinating more frequently. ? Back pain or pelvic pain. ? Fever or chills.  You have blood in your urine.  Your urine becomes discolored or cloudy.  Your urine smells bad.  You have severe pain that cannot be controlled with medicine. Summary  Asymptomatic bacteriuria is the presence of a large number of bacteria in the urine without the usual symptoms of burning or frequent urination.  Usually, treatment is not needed for this condition. Treating the condition can lead to other problems, such as too much yeast and the growth of antibiotic-resistant bacteria.  Some people, such as pregnant women and people with kidney transplants, do need treatment with antibiotic medicines to prevent  kidney infection (pyelonephritis).  If you were prescribed an antibiotic medicine, take it as told by your health care provider. Do not stop taking the antibiotic even if you start to feel better. This information is not intended to replace advice given to you by your health care provider. Make sure you  discuss any questions you have with your health care provider. Document Released: 08/13/2005 Document Revised: 08/07/2016 Document Reviewed: 08/07/2016 Elsevier Interactive Patient Education  2017 [employer] Morning Sickness Morning sickness is when you feel sick to your stomach (nauseous) during pregnancy. You may feel sick to your stomach and throw up (vomit). You may feel sick in the morning, but you can feel this way any time of day. Some women feel very sick to their stomach and cannot stop throwing up (hyperemesis gravidarum). Follow these instructions at home:  Only take medicines as told by your doctor.  Take multivitamins as told by your doctor. Taking multivitamins before getting pregnant can stop or lessen the harshness of morning sickness.  Eat dry toast or unsalted crackers before getting out of bed.  Eat 5 to 6 small meals a day.  Eat dry and bland foods like rice and baked potatoes.  Do not drink liquids with meals. Drink between meals.  Do not eat greasy, fatty, or spicy foods.  Have someone cook for you if the smell of food causes you to feel sick or throw up.  If you feel sick to your stomach after taking prenatal vitamins, take them at night or with a snack.  Eat protein when you need a snack (nuts, yogurt, cheese).  Eat unsweetened gelatins for dessert.  Wear a bracelet used for sea sickness (acupressure wristband).  Go to a doctor that puts thin needles into certain body points (acupuncture) to improve how you feel.  Do not smoke.  Use a humidifier to keep the air in your house free of odors.  Get lots of fresh air. Contact a doctor if:  You need medicine to feel better.  You feel dizzy or lightheaded.  You are losing weight. Get help right away if:  You feel very sick to your stomach and cannot stop throwing up.  You pass out (faint). This information is not intended to replace advice given to you by your health care provider. Make sure you  discuss any questions you have with your health care provider. Document Released: 09/20/2004 Document Revised: 01/19/2016 Document Reviewed: 01/28/2013 Elsevier Interactive Patient Education  2017 [employer]. How a Baby Grows During Pregnancy Pregnancy begins when a female's sperm enters a female's egg (fertilization). This happens in one of the tubes (fallopian tubes) that connect the ovaries to the womb (uterus). The fertilized egg is called an embryo until it reaches 10 weeks. From 10 weeks until birth, it is called a fetus. The fertilized egg moves down the fallopian tube to the uterus. Then it implants into the lining of the uterus and begins to grow. The developing fetus receives oxygen and nutrients through the pregnant woman's bloodstream and the tissues that grow (placenta) to support the fetus. The placenta is the life support system for the fetus. It provides nutrition and removes waste. Learning as much as you can about your pregnancy and how your baby is developing can help you enjoy the experience. It can also make you aware of when there might be a problem and when to ask questions. How long does a typical pregnancy last? A pregnancy usually lasts 280 days, or about 40 weeks.  Pregnancy is divided into three trimesters:  First trimester: 0-13 weeks.  Second trimester: 14-27 weeks.  Third trimester: 28-40 weeks.  The day when your baby is considered ready to be born (full term) is your estimated date of delivery. How does my baby develop month by month? First month  The fertilized egg attaches to the inside of the uterus.  Some cells will form the placenta. Others will form the fetus.  The arms, legs, brain, spinal cord, lungs, and heart begin to develop.  At the end of the first month, the heart begins to beat.  Second month  The bones, inner ear, eyelids, hands, and feet form.  The genitals develop.  By the end of 8 weeks, all major organs are developing.  Third  month  All of the internal organs are forming.  Teeth develop below the gums.  Bones and muscles begin to grow. The spine can flex.  The skin is transparent.  Fingernails and toenails begin to form.  Arms and legs continue to grow longer, and hands and feet develop.  The fetus is about 3 in (7.6 cm) long.  Fourth month  The placenta is completely formed.  The external sex organs, neck, outer ear, eyebrows, eyelids, and fingernails are formed.  The fetus can hear, swallow, and move its arms and legs.  The kidneys begin to produce urine.  The skin is covered with a white waxy coating (vernix) and very fine hair (lanugo).  Fifth month  The fetus moves around more and can be felt for the first time (quickening).  The fetus starts to sleep and wake up and may begin to suck its finger.  The nails grow to the end of the fingers.  The organ in the digestive system that makes bile (gallbladder) functions and helps to digest the nutrients.  If your baby is a girl, eggs are present in her ovaries. If your baby is a boy, testicles start to move down into his scrotum.  Sixth month  The lungs are formed, but the fetus is not yet able to breathe.  The eyes open. The brain continues to develop.  Your baby has fingerprints and toe prints. Your baby's hair grows thicker.  At the end of the second trimester, the fetus is about 9 in (22.9 cm) long.  Seventh month  The fetus kicks and stretches.  The eyes are developed enough to sense changes in light.  The hands can make a grasping motion.  The fetus responds to sound.  Eighth month  All organs and body systems are fully developed and functioning.  Bones harden and taste buds develop. The fetus may hiccup.  Certain areas of the brain are still developing. The skull remains soft.  Ninth month  The fetus gains about  lb (0.23 kg) each week.  The lungs are fully developed.  Patterns of sleep develop.  The fetus's  head typically moves into a head-down position (vertex) in the uterus to prepare for birth. If the buttocks move into a vertex position instead, the baby is breech.  The fetus weighs 6-9 lbs (2.72-4.08 kg) and is 19-20 in (48.26-50.8 cm) long.  What can I do to have a healthy pregnancy and help my baby develop? Eating and Drinking  Eat a healthy diet. ? Talk with your health care provider to make sure that you are getting the nutrients that you and your baby need. ? Visit www.BuildDNA.es to learn about creating a healthy diet.  Gain a healthy amount  of weight during pregnancy as advised by your health care provider. This is usually 25-35 pounds. You may need to: ? Gain more if you were underweight before getting pregnant or if you are pregnant with more than one baby. ? Gain less if you were overweight or obese when you got pregnant.  Medicines and Vitamins  Take prenatal vitamins as directed by your health care provider. These include vitamins such as folic acid, iron, calcium, and vitamin D. They are important for healthy development.  Take medicines only as directed by your health care provider. Read labels and ask a pharmacist or your health care provider whether over-the-counter medicines, supplements, and prescription drugs are safe to take during pregnancy.  Activities  Be physically active as advised by your health care provider. Ask your health care provider to recommend activities that are safe for you to do, such as walking or swimming.  Do not participate in strenuous or extreme sports.  Lifestyle  Do not drink alcohol.  Do not use any tobacco products, including cigarettes, chewing tobacco, or electronic cigarettes. If you need help quitting, ask your health care provider.  Do not use illegal drugs.  Safety  Avoid exposure to mercury, lead, or other heavy metals. Ask your health care provider about common sources of these heavy metals.  Avoid listeria  infection during pregnancy. Follow these precautions: ? Do not eat soft cheeses or deli meats. ? Do not eat hot dogs unless they have been warmed up to the point of steaming, such as in the microwave oven. ? Do not drink unpasteurized milk.  Avoid toxoplasmosis infection during pregnancy. Follow these precautions: ? Do not change your cat's litter box, if you have a cat. Ask someone else to do this for you. ? Wear gardening gloves while working in the yard.  General Instructions  Keep all follow-up visits as directed by your health care provider. This is important. This includes prenatal care and screening tests.  Manage any chronic health conditions. Work closely with your health care provider to keep conditions, such as diabetes, under control.  How do I know if my baby is developing well? At each prenatal visit, your health care provider will do several different tests to check on your health and keep track of your baby's development. These include:  Fundal height. ? Your health care provider will measure your growing belly from top to bottom using a tape measure. ? Your health care provider will also feel your belly to determine your baby's position.  Heartbeat. ? An ultrasound in the first trimester can confirm pregnancy and show a heartbeat, depending on how far along you are. ? Your health care provider will check your baby's heart rate at every prenatal visit. ? As you get closer to your delivery date, you may have regular fetal heart rate monitoring to make sure that your baby is not in distress.  Second trimester ultrasound. ? This ultrasound checks your baby's development. It also indicates your baby's gender.  What should I do if I have concerns about my baby's development? Always talk with your health care provider about any concerns that you may have. This information is not intended to replace advice given to you by your health care provider. Make sure you discuss any  questions you have with your health care provider. Document Released: 01/30/2008 Document Revised: 01/19/2016 Document Reviewed: 01/20/2014 Elsevier Interactive Patient Education  2018 [place] of Pregnancy The first trimester of pregnancy is from week 1  until the end of week 13 (months 1 through 3). A week after a sperm fertilizes an egg, the egg will implant on the wall of the uterus. This embryo will begin to develop into a baby. Genes from you and your partner will form the baby. The female genes will determine whether the baby will be a boy or a girl. At 6-8 weeks, the eyes and face will be formed, and the heartbeat can be seen on ultrasound. At the end of 12 weeks, all the baby's organs will be formed. Now that you are pregnant, you will want to do everything you can to have a healthy baby. Two of the most important things are to get good prenatal care and to follow your health care provider's instructions. Prenatal care is all the medical care you receive before the baby's birth. This care will help prevent, find, and treat any problems during the pregnancy and childbirth. Body changes during your first trimester Your body goes through many changes during pregnancy. The changes vary from woman to woman.  You may gain or lose a couple of pounds at first.  You may feel sick to your stomach (nauseous) and you may throw up (vomit). If the vomiting is uncontrollable, call your health care provider.  You may tire easily.  You may develop headaches that can be relieved by medicines. All medicines should be approved by your health care provider.  You may urinate more often. Painful urination may mean you have a bladder infection.  You may develop heartburn as a result of your pregnancy.  You may develop constipation because certain hormones are causing the muscles that push stool through your intestines to slow down.  You may develop hemorrhoids or swollen veins (varicose  veins).  Your breasts may begin to grow larger and become tender. Your nipples may stick out more, and the tissue that surrounds them (areola) may become darker.  Your gums may bleed and may be sensitive to brushing and flossing.  Dark spots or blotches (chloasma, mask of pregnancy) may develop on your face. This will likely fade after the baby is born.  Your menstrual periods will stop.  You may have a loss of appetite.  You may develop cravings for certain kinds of food.  You may have changes in your emotions from day to day, such as being excited to be pregnant or being concerned that something may go wrong with the pregnancy and baby.  You may have more vivid and strange dreams.  You may have changes in your hair. These can include thickening of your hair, rapid growth, and changes in texture. Some women also have hair loss during or after pregnancy, or hair that feels dry or thin. Your hair will most likely return to normal after your baby is born.  What to expect at prenatal visits During a routine prenatal visit:  You will be weighed to make sure you and the baby are growing normally.  Your blood pressure will be taken.  Your abdomen will be measured to track your baby's growth.  The fetal heartbeat will be listened to between weeks 10 and 14 of your pregnancy.  Test results from any previous visits will be discussed.  Your health care provider may ask you:  How you are feeling.  If you are feeling the baby move.  If you have had any abnormal symptoms, such as leaking fluid, bleeding, severe headaches, or abdominal cramping.  If you are using any tobacco products, including cigarettes,  chewing tobacco, and electronic cigarettes.  If you have any questions.  Other tests that may be performed during your first trimester include:  Blood tests to find your blood type and to check for the presence of any previous infections. The tests will also be used to check for  low iron levels (anemia) and protein on red blood cells (Rh antibodies). Depending on your risk factors, or if you previously had diabetes during pregnancy, you may have tests to check for high blood sugar that affects pregnant women (gestational diabetes).  Urine tests to check for infections, diabetes, or protein in the urine.  An ultrasound to confirm the proper growth and development of the baby.  Fetal screens for spinal cord problems (spina bifida) and Down syndrome.  HIV (human immunodeficiency virus) testing. Routine prenatal testing includes screening for HIV, unless you choose not to have this test.  You may need other tests to make sure you and the baby are doing well.  Follow these instructions at home: Medicines  Follow your health care provider's instructions regarding medicine use. Specific medicines may be either safe or unsafe to take during pregnancy.  Take a prenatal vitamin that contains at least 600 micrograms (mcg) of folic acid.  If you develop constipation, try taking a stool softener if your health care provider approves. Eating and drinking  Eat a balanced diet that includes fresh fruits and vegetables, whole grains, good sources of protein such as meat, eggs, or tofu, and low-fat dairy. Your health care provider will help you determine the amount of weight gain that is right for you.  Avoid raw meat and uncooked cheese. These carry germs that can cause birth defects in the baby.  Eating four or five small meals rather than three large meals a day may help relieve nausea and vomiting. If you start to feel nauseous, eating a few soda crackers can be helpful. Drinking liquids between meals, instead of during meals, also seems to help ease nausea and vomiting.  Limit foods that are high in fat and processed sugars, such as fried and sweet foods.  To prevent constipation: ? Eat foods that are high in fiber, such as fresh fruits and vegetables, whole grains, and  beans. ? Drink enough fluid to keep your urine clear or pale yellow. Activity  Exercise only as directed by your health care provider. Most women can continue their usual exercise routine during pregnancy. Try to exercise for 30 minutes at least 5 days a week. Exercising will help you: ? Control your weight. ? Stay in shape. ? Be prepared for labor and delivery.  Experiencing pain or cramping in the lower abdomen or lower back is a good sign that you should stop exercising. Check with your health care provider before continuing with normal exercises.  Try to avoid standing for long periods of time. Move your legs often if you must stand in one place for a long time.  Avoid heavy lifting.  Wear low-heeled shoes and practice good posture.  You may continue to have sex unless your health care provider tells you not to. Relieving pain and discomfort  Wear a good support bra to relieve breast tenderness.  Take warm sitz baths to soothe any pain or discomfort caused by hemorrhoids. Use hemorrhoid cream if your health care provider approves.  Rest with your legs elevated if you have leg cramps or low back pain.  If you develop varicose veins in your legs, wear support hose. Elevate your feet for 15  minutes, 3-4 times a day. Limit salt in your diet. Prenatal care  Schedule your prenatal visits by the twelfth week of pregnancy. They are usually scheduled monthly at first, then more often in the last 2 months before delivery.  Write down your questions. Take them to your prenatal visits.  Keep all your prenatal visits as told by your health care provider. This is important. Safety  Wear your seat belt at all times when driving.  Make a list of emergency phone numbers, including numbers for family, friends, the hospital, and police and fire departments. General instructions  Ask your health care provider for a referral to a local prenatal education class. Begin classes no later than the  beginning of month 6 of your pregnancy.  Ask for help if you have counseling or nutritional needs during pregnancy. Your health care provider can offer advice or refer you to specialists for help with various needs.  Do not use hot tubs, steam rooms, or saunas.  Do not douche or use tampons or scented sanitary pads.  Do not cross your legs for long periods of time.  Avoid cat litter boxes and soil used by cats. These carry germs that can cause birth defects in the baby and possibly loss of the fetus by miscarriage or stillbirth.  Avoid all smoking, herbs, alcohol, and medicines not prescribed by your health care provider. Chemicals in these products affect the formation and growth of the baby.  Do not use any products that contain nicotine or tobacco, such as cigarettes and e-cigarettes. If you need help quitting, ask your health care provider. You may receive counseling support and other resources to help you quit.  Schedule a dentist appointment. At home, brush your teeth with a soft toothbrush and be gentle when you floss. Contact a health care provider if:  You have dizziness.  You have mild pelvic cramps, pelvic pressure, or nagging pain in the abdominal area.  You have persistent nausea, vomiting, or diarrhea.  You have a bad smelling vaginal discharge.  You have pain when you urinate.  You notice increased swelling in your face, hands, legs, or ankles.  You are exposed to fifth disease or chickenpox.  You are exposed to [place] measles (rubella) and have never had it. Get help right away if:  You have a fever.  You are leaking fluid from your vagina.  You have spotting or bleeding from your vagina.  You have severe abdominal cramping or pain.  You have rapid weight gain or loss.  You vomit blood or material that looks like coffee grounds.  You develop a severe headache.  You have shortness of breath.  You have any kind of trauma, such as from a fall or a car  accident. Summary  The first trimester of pregnancy is from week 1 until the end of week 13 (months 1 through 3).  Your body goes through many changes during pregnancy. The changes vary from woman to woman.  You will have routine prenatal visits. During those visits, your health care provider will examine you, discuss any test results you may have, and talk with you about how you are feeling. This information is not intended to replace advice given to you by your health care provider. Make sure you discuss any questions you have with your health care provider. Document Released: 08/07/2001 Document Revised: 07/25/2016 Document Reviewed: 07/25/2016 Elsevier Interactive Patient Education  2018 [employer]. Common Medications Safe in Pregnancy  Acne:  Constipation:  Benzoyl Peroxide     Colace  Clindamycin      Dulcolax Suppository  Topica Erythromycin     Fibercon  Salicylic Acid      Metamucil         Miralax AVOID:        Senakot   Accutane    Cough:  Retin-A       Cough Drops  Tetracycline      Phenergan w/ Codeine if Rx  Minocycline      Robitussin (Plain & DM)  Antibiotics:     Crabs/Lice:  Ceclor       RID  Cephalosporins    AVOID:  E-Mycins      Kwell  Keflex  Macrobid/Macrodantin   Diarrhea:  Penicillin      Kao-Pectate  Zithromax      Imodium AD         PUSH FLUIDS AVOID:       Cipro     Fever:  Tetracycline      Tylenol (Regular or Extra  Minocycline       Strength)  Levaquin      Extra Strength-Do not          Exceed 8 tabs/24 hrs Caffeine:        <264m/day (equiv. To 1 cup of coffee or  approx. 3 12 oz sodas)         Gas: Cold/Hayfever:       Gas-X  Benadryl      Mylicon  Claritin       Phazyme  **Claritin-D        Chlor-Trimeton    Headaches:  Dimetapp      ASA-Free Excedrin  Drixoral-Non-Drowsy     Cold Compress  Mucinex (Guaifenasin)     Tylenol (Regular or Extra  Sudafed/Sudafed-12 Hour     Strength)  **Sudafed PE Pseudoephedrine   Tylenol  Cold & Sinus     Vicks Vapor Rub  Zyrtec  **AVOID if Problems With Blood Pressure         Heartburn: Avoid lying down for at least 1 hour after meals  Aciphex      Maalox     Rash:  Milk of Magnesia     Benadryl    Mylanta       1% Hydrocortisone Cream  Pepcid  Pepcid Complete   Sleep Aids:  Prevacid      Ambien   Prilosec       Benadryl  Rolaids       Chamomile Tea  Tums (Limit 4/day)     Unisom  Zantac       Tylenol PM         Warm milk-add vanilla or  Hemorrhoids:       Sugar for taste  Anusol/Anusol H.C.  (RX: Analapram 2.5%)  Sugar Substitutes:  Hydrocortisone OTC     Ok in moderation  Preparation H      Tucks        Vaseline lotion applied to tissue with wiping    Herpes:     Throat:  Acyclovir      Oragel  Famvir  Valtrex     Vaccines:         Flu Shot Leg Cramps:       *Gardasil  Benadryl      Hepatitis A         Hepatitis B Nasal Spray:       Pneumovax  Saline Nasal Spray  Polio Booster         Tetanus Nausea:       Tuberculosis test or PPD  Vitamin B6 25 mg TID   AVOID:    Dramamine      *Gardasil  Emetrol       Live Poliovirus  Ginger Root 250 mg QID    MMR (measles, mumps &  High Complex Carbs @ Bedtime    rebella)  Sea Bands-Accupressure    Varicella (Chickenpox)  Unisom 1/2 tab TID     *No known complications           If received before Pain:         Known pregnancy;   Darvocet       Resume series after  Lortab        Delivery  Percocet    Yeast:   Tramadol      Femstat  Tylenol 3      Gyne-lotrimin  Ultram       Monistat  Vicodin           MISC:         All Sunscreens           Hair Coloring/highlights          Insect Repellant's          (Including DEET)         Mystic Tans

## 2017-11-18 NOTE — Progress Notes (Signed)
Laura Sosa presents for NOB nurse interview visit. Pregnancy confirmation done at _[facility]_.  G-2 .  P1001. LMP- 09/22/2017- certain EDD- 06/29/2018.  Pregnancy education material explained and given. __0_ cats in the home. NOB labs ordered.  HIV labs and Drug screen were explained and ordered.  PNV encouraged. Pt not taking d/t n/v. Advised Flintstone or gummies.  Genetic screening options discussed. [facility] Pt is interested in genetic testing as her first child was dx with cleft palate at 32 years old.  Pt recently seen in ER due to hyperemesis. Pt given Diclegis at ed. She is taking 2 at bedtime. Pt states sx are under control.  Encouraged small frequent meals and hydration. Advised pt she may take up to 4 qd if needed. Last pap 2018- wnl- no h/o abnormal. Needs tdap at 28 weeks.   Pt. To follow up with provider(pt request md care)  in _4_ weeks for NOB physical.  All questions answered.

## 2017-11-18 NOTE — Addendum Note (Signed)
Addended by: Fabian NovemberHERRY, Ruthell Feigenbaum S on: 11/18/2017 02:36 PM   Modules accepted: Orders

## 2017-11-19 LAB — CBC WITH DIFFERENTIAL/PLATELET
BASOS: 1 %
Basophils Absolute: 0.1 10*3/uL (ref 0.0–0.2)
EOS (ABSOLUTE): 0.3 10*3/uL (ref 0.0–0.4)
EOS: 5 %
HEMATOCRIT: 40.5 % (ref 34.0–46.6)
Hemoglobin: 13.5 g/dL (ref 11.1–15.9)
IMMATURE GRANS (ABS): 0 10*3/uL (ref 0.0–0.1)
Immature Granulocytes: 0 %
LYMPHS: 26 %
Lymphocytes Absolute: 1.9 10*3/uL (ref 0.7–3.1)
MCH: 30.3 pg (ref 26.6–33.0)
MCHC: 33.3 g/dL (ref 31.5–35.7)
MCV: 91 fL (ref 79–97)
MONOS ABS: 0.5 10*3/uL (ref 0.1–0.9)
Monocytes: 6 %
Neutrophils Absolute: 4.4 10*3/uL (ref 1.4–7.0)
Neutrophils: 62 %
PLATELETS: 275 10*3/uL (ref 150–379)
RBC: 4.46 x10E6/uL (ref 3.77–5.28)
RDW: 12.7 % (ref 12.3–15.4)
WBC: 7.1 10*3/uL (ref 3.4–10.8)

## 2017-11-19 LAB — URINALYSIS, ROUTINE W REFLEX MICROSCOPIC
Bilirubin, UA: NEGATIVE
Glucose, UA: NEGATIVE
KETONES UA: NEGATIVE
LEUKOCYTES UA: NEGATIVE
NITRITE UA: NEGATIVE
Protein, UA: NEGATIVE
RBC, UA: NEGATIVE
SPEC GRAV UA: 1.009 (ref 1.005–1.030)
Urobilinogen, Ur: 0.2 mg/dL (ref 0.2–1.0)
pH, UA: 8 — ABNORMAL HIGH (ref 5.0–7.5)

## 2017-11-19 LAB — GC/CHLAMYDIA PROBE AMP
Chlamydia trachomatis, NAA: NEGATIVE
NEISSERIA GONORRHOEAE BY PCR: NEGATIVE

## 2017-11-19 LAB — HIV ANTIBODY (ROUTINE TESTING W REFLEX): HIV Screen 4th Generation wRfx: NONREACTIVE

## 2017-11-19 LAB — ABO AND RH: Rh Factor: POSITIVE

## 2017-11-19 LAB — HEPATITIS B SURFACE ANTIGEN: HEP B S AG: NEGATIVE

## 2017-11-19 LAB — DRUG PROFILE, UR, 9 DRUGS (LABCORP)
AMPHETAMINES, URINE: NEGATIVE ng/mL
Barbiturate Quant, Ur: NEGATIVE ng/mL
Benzodiazepine Quant, Ur: NEGATIVE ng/mL
CANNABINOID QUANT UR: NEGATIVE ng/mL
Cocaine (Metab.): NEGATIVE ng/mL
METHADONE SCREEN, URINE: NEGATIVE ng/mL
Opiate Quant, Ur: NEGATIVE ng/mL
PCP QUANT UR: NEGATIVE ng/mL
PROPOXYPHENE: NEGATIVE ng/mL

## 2017-11-19 LAB — RUBELLA SCREEN: Rubella Antibodies, IGG: <0.9 index — ABNORMAL LOW (ref >0.99)

## 2017-11-19 LAB — VARICELLA ZOSTER ANTIBODY, IGG: Varicella zoster IgG: 316 index (ref >165)

## 2017-11-19 LAB — ANTIBODY SCREEN: Antibody Screen: NEGATIVE

## 2017-11-19 LAB — NICOTINE SCREEN, URINE: COTININE UR QL SCN: NEGATIVE ng/mL

## 2017-11-19 LAB — RPR: RPR: NONREACTIVE

## 2017-11-20 LAB — URINE CULTURE, OB REFLEX: ORGANISM ID, BACTERIA: NO GROWTH

## 2017-11-20 LAB — CULTURE, OB URINE

## 2017-11-22 ENCOUNTER — Encounter: Payer: Self-pay | Admitting: Obstetrics and Gynecology

## 2017-11-22 ENCOUNTER — Encounter: Payer: Self-pay | Admitting: Certified Nurse Midwife

## 2017-11-26 ENCOUNTER — Emergency Department: Payer: Medicaid Other

## 2017-11-26 ENCOUNTER — Emergency Department
Admission: EM | Admit: 2017-11-26 | Discharge: 2017-11-26 | Disposition: A | Discharge status: Home / Self Care | Payer: Medicaid Other | Attending: Emergency Medicine | Admitting: Emergency Medicine

## 2017-11-26 ENCOUNTER — Other Ambulatory Visit: Payer: Self-pay

## 2017-11-26 ENCOUNTER — Encounter: Payer: Self-pay | Admitting: *Deleted

## 2017-11-26 DIAGNOSIS — O26899 Other specified pregnancy related conditions, unspecified trimester: Secondary | ICD-10-CM

## 2017-11-26 DIAGNOSIS — F1590 Other stimulant use, unspecified, uncomplicated: Secondary | ICD-10-CM | POA: Diagnosis not present

## 2017-11-26 DIAGNOSIS — R102 Pelvic and perineal pain: Secondary | ICD-10-CM | POA: Insufficient documentation

## 2017-11-26 DIAGNOSIS — Z79899 Other long term (current) drug therapy: Secondary | ICD-10-CM | POA: Diagnosis not present

## 2017-11-26 DIAGNOSIS — O210 Mild hyperemesis gravidarum: Secondary | ICD-10-CM | POA: Diagnosis not present

## 2017-11-26 DIAGNOSIS — O219 Vomiting of pregnancy, unspecified: Secondary | ICD-10-CM | POA: Diagnosis present

## 2017-11-26 DIAGNOSIS — O99321 Drug use complicating pregnancy, first trimester: Secondary | ICD-10-CM | POA: Diagnosis not present

## 2017-11-26 DIAGNOSIS — Z3A09 9 weeks gestation of pregnancy: Secondary | ICD-10-CM | POA: Diagnosis not present

## 2017-11-26 LAB — HCG, QUANTITATIVE, PREGNANCY: hCG, Beta Chain, Quant, S: 138543 m[IU]/mL — ABNORMAL HIGH (ref <5)

## 2017-11-26 LAB — COMPREHENSIVE METABOLIC PANEL
ALT: 30 U/L (ref 14–54)
ANION GAP: 9 (ref 5–15)
AST: 22 U/L (ref 15–41)
Albumin: 4.5 g/dL (ref 3.5–5.0)
Alkaline Phosphatase: 60 U/L (ref 38–126)
BILIRUBIN TOTAL: 1.2 mg/dL (ref 0.3–1.2)
BUN: 11 mg/dL (ref 6–20)
CO2: 22 mmol/L (ref 22–32)
Calcium: 9.5 mg/dL (ref 8.9–10.3)
Chloride: 104 mmol/L (ref 101–111)
Creatinine, Ser: 0.65 mg/dL (ref 0.44–1.00)
GFR calc Af Amer: >60 mL/min (ref >60)
Glucose, Bld: 83 mg/dL (ref 65–99)
Potassium: 3.8 mmol/L (ref 3.5–5.1)
Sodium: 135 mmol/L (ref 135–145)
TOTAL PROTEIN: 8.2 g/dL — AB (ref 6.5–8.1)

## 2017-11-26 LAB — CBC
HEMATOCRIT: 41.2 % (ref 35.0–47.0)
HEMOGLOBIN: 14 g/dL (ref 12.0–16.0)
MCH: 30.4 pg (ref 26.0–34.0)
MCHC: 34 g/dL (ref 32.0–36.0)
MCV: 89.6 fL (ref 80.0–100.0)
Platelets: 265 10*3/uL (ref 150–440)
RBC: 4.6 MIL/uL (ref 3.80–5.20)
RDW: 12.5 % (ref 11.5–14.5)
WBC: 7.7 10*3/uL (ref 3.6–11.0)

## 2017-11-26 MED ORDER — SODIUM CHLORIDE 0.9 % IV BOLUS
1000.0000 mL | Freq: Once | INTRAVENOUS | Status: AC
  Administered 2017-11-26: 1000 mL via INTRAVENOUS

## 2017-11-26 MED ORDER — SODIUM CHLORIDE 0.9 % IV BOLUS
1000.0000 mL | Freq: Once | INTRAVENOUS | Status: AC
Start: 2017-11-26 — End: 2017-11-26
  Administered 2017-11-26: 1000 mL via INTRAVENOUS

## 2017-11-26 MED ORDER — METOCLOPRAMIDE HCL 10 MG PO TABS: 10.0000 mg | ORAL_TABLET | Freq: Three times a day (TID) | ORAL | 1 refills | Status: DC

## 2017-11-26 MED ORDER — METOCLOPRAMIDE HCL 5 MG/ML IJ SOLN
10.0000 mg | Freq: Once | INTRAMUSCULAR | Status: AC
  Administered 2017-11-26: 10 mg via INTRAVENOUS
  Filled 2017-11-26: qty 2

## 2017-11-26 NOTE — ED Notes (Signed)
Eating ice chips and tolerating well

## 2017-11-26 NOTE — ED Notes (Signed)
To u/s and back  No vomiting since arrival  Family at bedside

## 2017-11-26 NOTE — ED Triage Notes (Signed)
Pt to ED reporting hyperemesis gravedeum for the past month that has not been responding to medications. PT was seen at OBGYN last week for the inability to keep food down and now reports she has been unable to keep fluids down for the past couple days. Pt is [redacted] weeks pregnant and reports she was been told she is a high risk pregnancy due to the inability to eat and drink.

## 2017-11-26 NOTE — ED Provider Notes (Signed)
[facility] Emergency Department Provider Note   ____________________________________________   First Laura Sosa Initiated Contact with Patient 11/26/17 1255     (approximate)  I have reviewed the triage vital signs and the nursing notes.   HISTORY  Chief Complaint Emesis   HPI Laura Sosa is a 32 y.o. female who presents to the emergency department for evaluation and treatment of persistent vomiting.  She is approximately [redacted] weeks pregnant.  Gravida 2 para 1.  She had similar symptoms with her last pregnancy that lasted throughout the majority of the terms.  She has been taking likely just as prescribed.  Her obstetrician has advised that she come to the emergency department for rehydration.  She states that she has had some pelvic pressure but no gush of fluids, vaginal discharge or vaginal bleeding.   Past Medical History:  Diagnosis Date  . Headache   . History of kidney stones     Patient Active Problem List   Diagnosis Date Noted  . Pyelonephritis 04/17/2016  . Sepsis secondary to UTI (HCC) 04/16/2016    Past Surgical History:  Procedure Laterality Date  . CYSTOSCOPY/RETROGRADE/URETEROSCOPY  03/15/2017   Procedure: CYSTOSCOPY/RETROGRADE/URETEROSCOPY;  Surgeon: Laura Laser, Laura Sosa;  Location: [facility] ORS;  Service: Urology;;    Prior to Admission medications   Medication Sig Start Date End Date Taking? Authorizing Provider  Doxylamine-Pyridoxine 10-10 MG TBEC Take 2 tablets by mouth at bedtime. 11/08/17   Laura Creamer, Laura Sosa  metoCLOPramide (REGLAN) 10 MG tablet Take 1 tablet (10 mg total) by mouth 3 (three) times daily with meals. 11/26/17 11/26/18  Laura Pester, FNP    Allergies Patient has no known allergies.  Family History  Problem Relation Age of Onset  . Cancer Maternal Grandmother   . Kidney cancer Neg Hx   . Bladder Cancer Neg Hx   . Breast cancer Neg Hx   . Ovarian cancer Neg Hx   . Colon cancer Neg Hx   . Diabetes Neg Hx      Social History Social History   Tobacco Use  . Smoking status: Never Smoker  . Smokeless tobacco: Never Used  Substance Use Topics  . Alcohol use: No  . Drug use: No    Review of Systems  Constitutional: No fever/chills Eyes: No visual changes. ENT: No sore throat. Cardiovascular: Denies chest pain. Respiratory: Denies shortness of breath. Gastrointestinal: No abdominal pain.  Positive for abdominal pressure. Positive for nausea and vomiting. Genitourinary: Negative for dysuria. Musculoskeletal: Negative for back pain. Skin: Negative for rash. Neurological: Negative for headaches, focal weakness or numbness. ____________________________________________   PHYSICAL EXAM:  VITAL SIGNS: ED Triage Vitals [11/26/17 1228]  Enc Vitals Group     BP 103/75     Pulse Rate 87     Resp 16     Temp 98.3 F (36.8 C)     Temp Source Oral     SpO2 96 %     Weight      Height 5\' 3"  (1.6 m)     Head Circumference      Peak Flow      Pain Score 0     Pain Loc      Pain Edu?      Excl. in GC?     Constitutional: Alert and oriented. Well appearing and in no acute distress. Pale. Eyes: Conjunctivae are normal. Head: Atraumatic. Nose: No congestion/rhinnorhea. Mouth/Throat: Mucous membranes are dry.  Oropharynx non-erythematous. Neck: Supple Cardiovascular: Normal rate, regular  rhythm. Grossly normal heart sounds.  Good peripheral circulation. Respiratory: Normal respiratory effort.  No retractions. Lungs CTAB. Gastrointestinal: Soft and nontender. No distention.  Musculoskeletal: No lower extremity tenderness nor edema.  No joint effusions. Neurologic:  Normal speech and language. No gross focal neurologic deficits are appreciated. No gait instability. Skin:  Skin is warm, dry and intact. No rash noted. Psychiatric: Mood and affect are normal. Speech and behavior are normal.  ____________________________________________   LABS (all labs ordered are listed, but only  abnormal results are displayed)  Labs Reviewed  COMPREHENSIVE METABOLIC PANEL - Abnormal; Notable for the following components:      Result Value   Total Protein 8.2 (*)    All other components within normal limits  HCG, QUANTITATIVE, PREGNANCY - Abnormal; Notable for the following components:   hCG, Beta Chain, Laura LongestQuant, S [id number] (*)    All other components within normal limits  CBC   ____________________________________________  EKG ____________________________________________  RADIOLOGY  Official radiology report(s): [place] Ob Less Than 14 Weeks With Ob Transvaginal  Result Date: 11/26/2017 CLINICAL DATA:  First-trimester pregnancy.  Pelvic pain. EXAM: OBSTETRIC <14 WK US AND TRANSVAGINAL OB US TECHNIQUE: Both transabdominal and transvaginal ultrasound examinations were performed for complete evaluation of the gestation as well as the maternal uterus, adnexal regions, and pelvic cul-de-sac. Transvaginal technique was performed to assess early pregnancy. COMPARISON:  None. FINDINGS: Intrauterine gestational sac: Present Yolk sac:  Present Embryo:  Present Cardiac Activity: Present Heart Rate: 180 bpm CRL: 28 mm   9 w   2 d                  US EDC: 06/29/2018 Subchorionic hemorrhage:  None visualized. Maternal uterus/adnexae: A posterior uterine fibroid measures 3.4 cm. No other discrete mass lesion is present. Adnexa are within normal limits. IMPRESSION: 1. Single intrauterine pregnancy with an estimated gestational age of [redacted] weeks and 2 days. 2. Normal fetal heart rate of 180 beats per minute Electronically Signed   By: Laura Robertshristopher  Sosa M.D.   On: 11/26/2017 15:07    ____________________________________________   PROCEDURES  Procedure(s) performed: None  Procedures  Critical Care performed: No  ____________________________________________   INITIAL IMPRESSION / ASSESSMENT AND PLAN / ED COURSE  As part of my medical decision making, I reviewed the following data within the  electronic MEDICAL RECORD NUMBER    32 year old female presenting to the emergency department for treatment of hyperemesis during her first trimester of her second pregnancy.  While in the emergency department today, she received 2 L of normal saline and 10 mg of Reglan IV.  She was then able to tolerate ice chips without associated nausea or vomiting.  Ultrasound reveals a single IUP dating consistently with her last menstrual cycle.  Fetal heart rate was observed at 180 bpm.  EDC of 06/29/2018.  Patient will be given a prescription for the Reglan and encouraged to also continue her likely just as prescribed by the obstetrician.  She was advised to call and schedule a follow-up appointment with her obstetrician for any symptoms of concern.  She was encouraged to return to the emergency department if she is again unable to tolerate fluids or further symptoms of concern. ____________________________________________   FINAL CLINICAL IMPRESSION(S) / ED DIAGNOSES  Final diagnoses:  Hyperemesis gravidarum     ED Discharge Orders        Ordered    metoCLOPramide (REGLAN) 10 MG tablet  3 times daily with meals     11/26/17  1612       Note:  This document was prepared using Dragon voice recognition software and may include unintentional dictation errors.    Laura Pester, FNP 11/26/17 1657    Emily Filbert, Laura Sosa 11/27/17 0700

## 2017-11-26 NOTE — ED Notes (Signed)
See triage note  States is 9 weeks preg and has had vomiting for  The past month  Denies any vaginal bleeding or pain    Follows up with [facility] for her pre natal care

## 2017-12-05 ENCOUNTER — Other Ambulatory Visit: Payer: Self-pay

## 2017-12-05 MED ORDER — DOXYLAMINE-PYRIDOXINE 10-10 MG PO TBEC: 2.0000 | DELAYED_RELEASE_TABLET | Freq: Every day | ORAL | 0 refills | Status: DC

## 2017-12-18 ENCOUNTER — Encounter: Payer: Self-pay | Admitting: Obstetrics and Gynecology

## 2017-12-18 ENCOUNTER — Ambulatory Visit (INDEPENDENT_AMBULATORY_CARE_PROVIDER_SITE_OTHER): Payer: Medicaid Other | Admitting: Obstetrics and Gynecology

## 2017-12-18 VITALS — BP 110/72 | HR 76 | Ht 63.0 in | Wt 144.6 lb

## 2017-12-18 DIAGNOSIS — O9989 Other specified diseases and conditions complicating pregnancy, childbirth and the puerperium: Secondary | ICD-10-CM

## 2017-12-18 DIAGNOSIS — Z3481 Encounter for supervision of other normal pregnancy, first trimester: Secondary | ICD-10-CM

## 2017-12-18 DIAGNOSIS — N979 Female infertility, unspecified: Secondary | ICD-10-CM | POA: Insufficient documentation

## 2017-12-18 DIAGNOSIS — Z87448 Personal history of other diseases of urinary system: Secondary | ICD-10-CM

## 2017-12-18 DIAGNOSIS — Z2839 Other underimmunization status: Secondary | ICD-10-CM | POA: Insufficient documentation

## 2017-12-18 DIAGNOSIS — Z8279 Family history of other congenital malformations, deformations and chromosomal abnormalities: Secondary | ICD-10-CM

## 2017-12-18 DIAGNOSIS — O09899 Supervision of other high risk pregnancies, unspecified trimester: Secondary | ICD-10-CM

## 2017-12-18 DIAGNOSIS — Z87442 Personal history of urinary calculi: Secondary | ICD-10-CM | POA: Insufficient documentation

## 2017-12-18 DIAGNOSIS — Z283 Underimmunization status: Secondary | ICD-10-CM

## 2017-12-18 DIAGNOSIS — O210 Mild hyperemesis gravidarum: Secondary | ICD-10-CM

## 2017-12-18 LAB — POCT URINALYSIS DIPSTICK
Glucose, UA: NEGATIVE
KETONES UA: NEGATIVE
NITRITE UA: NEGATIVE
PH UA: 6 (ref 5.0–8.0)
Spec Grav, UA: >=1.03 — AB (ref 1.010–1.025)
UROBILINOGEN UA: 0.2 U/dL

## 2017-12-18 NOTE — Progress Notes (Signed)
ROB-pt is having some problems urinating she has to go but not a lot is coming out. p thinks she may have an UTI.prenata Pt was at the ED about 2 weeks ago for dehydration. Pt is unable to keep anything down food or liquid since 4 weeks of pregnancy.

## 2017-12-18 NOTE — Progress Notes (Signed)
OBSTETRIC INITIAL PRENATAL VISIT  Subjective:    Laura Sosa is being seen today for her first obstetrical visit.  This is a planned pregnancy. She is a G2P1001 female at [redacted]w[redacted]d gestation, Estimated Date of Delivery: 06/29/18 with Patient's last menstrual period 09/22/2017 (exact date), consistent with 9 week ER sono. 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 She was seen in the ED 2 weeks ago for vomiting in early pregnancy. Was treated with IV hydration, started on Reglan and Diclegis.  Also notes having hyperemesis her entire last pregnancy.  Relationship with FOB: significant other, living together. Patient does not intend to breast feed. Pregnancy history fully reviewed.    OB History  Gravida Para Term Preterm AB Living  2 1 1  0 0 1  SAB TAB Ectopic Multiple Live Births  0 0 0 0 1    # Outcome Date GA Lbr Len/2nd Weight Sex Delivery Anes PTL Lv  2 Current           1 Term 2011   8 lb 6 oz (3.799 kg) F Vag-Spont   LIV    Gynecologic History:  [facility] last year[facility]  Results were normal.  Denies h/o abnormal pap smears in the past.  Denies history of STIs.    Past Medical History:  Diagnosis Date  . Headache   . History of kidney stones   . Pyelonephritis 04/17/2016     Family History  Problem Relation Age of Onset  . Cancer Maternal Grandmother   . Kidney cancer Neg Hx   . Bladder Cancer Neg Hx   . Breast cancer Neg Hx   . Ovarian cancer Neg Hx   . Colon cancer Neg Hx   . Diabetes Neg Hx      Past Surgical History:  Procedure Laterality Date  . CYSTOSCOPY/RETROGRADE/URETEROSCOPY  03/15/2017   Procedure: CYSTOSCOPY/RETROGRADE/URETEROSCOPY;  Surgeon: Hildred Laser, MD;  Location: [facility] ORS;  Service: Urology;;     Social History   Socioeconomic History  . Marital status: Single    Spouse name: Not on file  . Number of children: Not on file    . Years of education: Not on file  . Highest education level: Not on file  Occupational History  . Not on file  Social Needs  . Financial resource strain: Not on file  . Food insecurity:    Worry: Not on file    Inability: Not on file  . Transportation needs:    Medical: Not on file    Non-medical: Not on file  Tobacco Use  . Smoking status: Never Smoker  . Smokeless tobacco: Never Used  Substance and Sexual Activity  . Alcohol use: No  . Drug use: No  . Sexual activity: Yes    Birth control/protection: None  Lifestyle  . Physical activity:    Days per week: Not on file    Minutes per session: Not on file  . Stress: Not on file  Relationships  . Social connections:    Talks on phone: Not on file    Gets together: Not on file    Attends religious service: Not on file    Active member of club or organization: Not on file    Attends meetings of clubs or organizations: Not on file    Relationship status: Not on file  . Intimate partner violence:    Fear of current or ex  partner: Not on file    Emotionally abused: Not on file    Physically abused: Not on file    Forced sexual activity: Not on file  Other Topics Concern  . Not on file  Social History Narrative  . Not on file     Current Outpatient Medications on File Prior to Visit  Medication Sig Dispense Refill  . Doxylamine-Pyridoxine 10-10 MG TBEC Take 2 tablets by mouth at bedtime. 60 tablet 0  . metoCLOPramide (REGLAN) 10 MG tablet Take 1 tablet (10 mg total) by mouth 3 (three) times daily with meals. 90 tablet 1   No current facility-administered medications on file prior to visit.      No Known Allergies    Review of Systems General:Not Present- Fever, Weight Loss and Weight Gain. Skin:Not Present- Rash. HEENT:Not Present- Blurred Vision, Headache and Bleeding Gums. Respiratory:Not Present- Difficulty Breathing. Breast:Not Present- Breast Mass. Cardiovascular:Not Present- Chest Pain, Elevated  Blood Pressure, Fainting / Blacking Out and Shortness of Breath. Gastrointestinal:Not Present- Abdominal Pain, Constipation. Present - Nausea and Vomiting.  Female Genitourinary:Not Present- Frequency, Painful Urination, Pelvic Pain, Vaginal Bleeding, Vaginal Discharge, Contractions, regular, Fetal Movements Decreased, Urinary Complaints and Vaginal Fluid. Musculoskeletal:Not Present- Back Pain and Leg Cramps. Neurological:Not Present- Dizziness. Psychiatric:Not Present- Depression.     Objective:   Blood pressure 110/72, pulse 76, weight 144 lb 9.6 oz (65.6 kg), last menstrual period 09/22/2017, unknown if currently breastfeeding.  Body mass index is 25.61 kg/m.  General Appearance:    Alert, cooperative, no distress, appears stated age  Head:    Normocephalic, without obvious abnormality, atraumatic  Eyes:    PERRL, conjunctiva/corneas clear, EOM's intact, both eyes  Ears:    Normal external ear canals, both ears  Nose:   Nares normal, septum midline, mucosa normal, no drainage or sinus tenderness  Throat:   Lips, mucosa, and tongue normal; teeth and gums normal  Neck:   Supple, symmetrical, trachea midline, no adenopathy; thyroid: no enlargement/tenderness/nodules; no carotid bruit or JVD  Back:     Symmetric, no curvature, ROM normal, no CVA tenderness  Lungs:     Clear to auscultation bilaterally, respirations unlabored  Chest Wall:    No tenderness or deformity   Heart:    Regular rate and rhythm, S1 and S2 normal, no murmur, rub or gallop  Breast Exam:    No tenderness, masses, or nipple abnormality  Abdomen:     Soft, non-tender, bowel sounds active all four quadrants, no masses, no organomegaly.  FHT 154  bpm.  Genitalia:    Pelvic:external genitalia normal, vagina without lesions, discharge, or tenderness, rectovaginal septum  normal. Cervix normal in appearance, no cervical motion tenderness, no adnexal masses or tenderness.  Pregnancy positive findings: uterine enlargement:  12 wk size, nontender.   Rectal:    Normal external sphincter.  No hemorrhoids appreciated. Internal exam not done.   Extremities:   Extremities normal, atraumatic, no cyanosis or edema  Pulses:   2+ and symmetric all extremities  Skin:   Skin color, texture, turgor normal, no rashes or lesions  Lymph nodes:   Cervical, supraclavicular, and axillary nodes normal  Neurologic:   CNII-XII intact, normal strength, sensation and reflexes throughout       Assessment:    Pregnancy at 12 and 3/7 weeks   Hyperemesis H/o cleft palate in previous child H/o pyelonephritis and kidney stones Rubella non-immune  Plan:    Initial labs reviewed.  Rubella non-immune. Will need vaccine postpartum Prenatal vitamins encouraged.  Not currently taking due to hyperemesis. Recommended chewables such as Flinstones or gummies.  Problem list reviewed and updated. New OB counseling:  The patient has been given an overview regarding routine prenatal care.  Recommendations regarding diet, weight gain, and exercise in pregnancy were given. Prenatal testing, optional genetic testing, and ultrasound use in pregnancy were reviewed.  Panorama testing desired with [employer] carrier screening. Performed today.  Benefits of Breast Feeding were discussed. The patient is encouraged to consider nursing her baby post partum. Continue Reglan and Diclegis for hyperemesis.  H/o cleft palate in previous child - patient was told it may have been due to the significant amount of Zofran she took during her last pregnancy that was complicated by hyperemesis. Does not desire to use Zofran during current pregnancy.  Also genetic testing performed.  Follow up in 4 weeks.  50% of 30 min visit spent on counseling and coordination of care.    The patient has Medicaid.  [place] Medicaid Risk Screening Form completed today  Hildred Laserherry, Fermin Yan, MD [facility]

## 2017-12-24 ENCOUNTER — Other Ambulatory Visit: Payer: Self-pay

## 2017-12-24 ENCOUNTER — Encounter: Payer: Self-pay | Admitting: Obstetrics and Gynecology

## 2017-12-25 ENCOUNTER — Other Ambulatory Visit: Payer: Self-pay

## 2017-12-25 MED ORDER — DOXYLAMINE-PYRIDOXINE 10-10 MG PO TBEC: 1.0000 | DELAYED_RELEASE_TABLET | Freq: Three times a day (TID) | ORAL | 3 refills | Status: DC

## 2017-12-27 ENCOUNTER — Telehealth: Payer: Self-pay

## 2017-12-27 NOTE — Telephone Encounter (Signed)
Pt was called and informed that her genetic testing was normal. Pt did not want to know the fetal sex, but she gave me a number to call her best friend to tell her because they are planning a reveal party.

## 2018-01-06 ENCOUNTER — Encounter: Payer: Self-pay | Admitting: Obstetrics and Gynecology

## 2018-01-15 ENCOUNTER — Encounter: Payer: Self-pay | Admitting: Obstetrics and Gynecology

## 2018-01-15 ENCOUNTER — Ambulatory Visit (INDEPENDENT_AMBULATORY_CARE_PROVIDER_SITE_OTHER): Payer: Medicaid Other | Admitting: Obstetrics and Gynecology

## 2018-01-15 VITALS — BP 94/62 | HR 75 | Wt 147.2 lb

## 2018-01-15 DIAGNOSIS — Z3482 Encounter for supervision of other normal pregnancy, second trimester: Secondary | ICD-10-CM

## 2018-01-15 DIAGNOSIS — O210 Mild hyperemesis gravidarum: Secondary | ICD-10-CM

## 2018-01-15 LAB — POCT URINALYSIS DIPSTICK
Bilirubin, UA: NEGATIVE
GLUCOSE UA: NEGATIVE
KETONES UA: NEGATIVE
NITRITE UA: NEGATIVE
Odor: NEGATIVE
PH UA: 8 (ref 5.0–8.0)
PROTEIN UA: NEGATIVE
RBC UA: NEGATIVE
SPEC GRAV UA: 1.01 (ref 1.010–1.025)
Urobilinogen, UA: 0.2 E.U./dL

## 2018-01-15 NOTE — Progress Notes (Signed)
ROB: Patient's nausea and vomiting not resolved but at least manageable while taking Diclegis and Reglan.  Discussed use of Ensure frequent small meals bananas etc. FAS with next visit.  Patient interested in AFP for spina bifida next visit.  Negative urinary ketones

## 2018-01-15 NOTE — Progress Notes (Signed)
ROB- unsure about 2nd trimester testing.

## 2018-01-27 ENCOUNTER — Other Ambulatory Visit: Payer: Self-pay | Admitting: Obstetrics and Gynecology

## 2018-01-27 DIAGNOSIS — Z3689 Encounter for other specified antenatal screening: Secondary | ICD-10-CM

## 2018-02-10 ENCOUNTER — Ambulatory Visit (INDEPENDENT_AMBULATORY_CARE_PROVIDER_SITE_OTHER): Payer: Medicaid Other | Admitting: Obstetrics and Gynecology

## 2018-02-10 ENCOUNTER — Ambulatory Visit (INDEPENDENT_AMBULATORY_CARE_PROVIDER_SITE_OTHER): Payer: Medicaid Other

## 2018-02-10 VITALS — BP 98/66 | HR 70 | Wt 150.7 lb

## 2018-02-10 DIAGNOSIS — Q270 Congenital absence and hypoplasia of umbilical artery: Secondary | ICD-10-CM | POA: Insufficient documentation

## 2018-02-10 DIAGNOSIS — Z3482 Encounter for supervision of other normal pregnancy, second trimester: Secondary | ICD-10-CM

## 2018-02-10 DIAGNOSIS — O2340 Unspecified infection of urinary tract in pregnancy, unspecified trimester: Secondary | ICD-10-CM

## 2018-02-10 DIAGNOSIS — Z1379 Encounter for other screening for genetic and chromosomal anomalies: Secondary | ICD-10-CM

## 2018-02-10 DIAGNOSIS — Z3689 Encounter for other specified antenatal screening: Secondary | ICD-10-CM

## 2018-02-10 LAB — POCT URINALYSIS DIPSTICK
Bilirubin, UA: NEGATIVE
Glucose, UA: NEGATIVE
KETONES UA: NEGATIVE
Nitrite, UA: POSITIVE
Protein, UA: POSITIVE — AB
RBC UA: NEGATIVE
Spec Grav, UA: 1.02 (ref 1.010–1.025)
UROBILINOGEN UA: 0.2 U/dL
pH, UA: 6.5 (ref 5.0–8.0)

## 2018-02-10 MED ORDER — NITROFURANTOIN MONOHYD MACRO 100 MG PO CAPS
100.0000 mg | ORAL_CAPSULE | Freq: Two times a day (BID) | ORAL | 1 refills | Status: DC
Start: 2018-02-10 — End: 2018-03-11

## 2018-02-10 NOTE — Progress Notes (Signed)
ROB-pt stated that she doing well no concerns. Pt was encouraged to try gummy prenatal vitamins. Pt stated that all other vitamins she vomited them up.

## 2018-02-10 NOTE — Progress Notes (Signed)
ROB: Patient doing well. Does note nausea/vomiting with PNV. Encouraged on gummies. Normal anatomy scan performed today with exception of 2-vessel cord. UA today with nitrites, will treat with Macrobid for UTI (asymptomatic), culture sent. Discussed doula program, circumcision for female infant.  RTC in 4 weeks.

## 2018-02-12 LAB — AFP, SERUM, OPEN SPINA BIFIDA
AFP MoM: 1.53
AFP Value: 85.8 ng/mL
GEST. AGE ON COLLECTION DATE: 20.1 wk
MATERNAL AGE AT EDD: 30 a
OSBR Risk 1 IN: 2523
TEST RESULTS AFP: NEGATIVE
Weight: 150 [lb_av]

## 2018-02-13 LAB — URINE CULTURE

## 2018-02-24 ENCOUNTER — Encounter: Payer: Self-pay | Admitting: Obstetrics and Gynecology

## 2018-02-25 ENCOUNTER — Encounter: Payer: Medicaid Other | Admitting: Obstetrics and Gynecology

## 2018-02-25 ENCOUNTER — Telehealth: Payer: Self-pay | Admitting: Obstetrics and Gynecology

## 2018-02-25 ENCOUNTER — Observation Stay
Admission: EM | Admit: 2018-02-25 | Discharge: 2018-02-25 | Disposition: A | Discharge status: Home / Self Care | Payer: Medicaid Other | Attending: Obstetrics and Gynecology | Admitting: Obstetrics and Gynecology

## 2018-02-25 ENCOUNTER — Other Ambulatory Visit: Payer: Self-pay

## 2018-02-25 DIAGNOSIS — R109 Unspecified abdominal pain: Secondary | ICD-10-CM

## 2018-02-25 DIAGNOSIS — O9989 Other specified diseases and conditions complicating pregnancy, childbirth and the puerperium: Secondary | ICD-10-CM | POA: Diagnosis not present

## 2018-02-25 DIAGNOSIS — O2340 Unspecified infection of urinary tract in pregnancy, unspecified trimester: Secondary | ICD-10-CM | POA: Diagnosis present

## 2018-02-25 DIAGNOSIS — Z3A22 22 weeks gestation of pregnancy: Secondary | ICD-10-CM | POA: Diagnosis not present

## 2018-02-25 DIAGNOSIS — O2342 Unspecified infection of urinary tract in pregnancy, second trimester: Principal | ICD-10-CM | POA: Insufficient documentation

## 2018-02-25 MED ORDER — LIDOCAINE HCL (PF) 1 % IJ SOLN
INTRAMUSCULAR | Status: AC
  Administered 2018-02-25: 2.1 mL
  Filled 2018-02-25: qty 30

## 2018-02-25 MED ORDER — ACETAMINOPHEN 500 MG PO TABS
1000.0000 mg | ORAL_TABLET | Freq: Four times a day (QID) | ORAL | Status: DC | PRN
  Administered 2018-02-25: 1000 mg via ORAL
  Filled 2018-02-25: qty 2

## 2018-02-25 MED ORDER — NITROFURANTOIN MONOHYD MACRO 100 MG PO CAPS
100.0000 mg | ORAL_CAPSULE | Freq: Two times a day (BID) | ORAL | Status: DC
  Administered 2018-02-25: 100 mg via ORAL
  Filled 2018-02-25: qty 1

## 2018-02-25 MED ORDER — CEFTRIAXONE SODIUM 1 G IJ SOLR
1.0000 g | Freq: Once | INTRAMUSCULAR | Status: AC
  Administered 2018-02-25: 1 g via INTRAMUSCULAR
  Filled 2018-02-25: qty 10

## 2018-02-25 NOTE — OB Triage Note (Signed)
Patient discharged home ambulatory and in stable condition.  AVS provided to patient.  Patient verbalizes understanding of all written discharge instructions.

## 2018-02-25 NOTE — ED Notes (Signed)
FIRST NURSE NOTE:  Spoke with Joy in L&D. Pt can be seen on their unit for sxs.

## 2018-02-25 NOTE — OB Triage Note (Signed)
Patient arrived on unit via wheelchair with complaints of bilateral flank pain.  Patient diagnosed with UTI two weeks ago.  Patient has finished her medication.  MD notified and orders given.

## 2018-02-25 NOTE — Final Progress Note (Signed)
L&D OB Triage Note  [place] R Eldridge is a 32 y.o. G2P1001 female at 4157w2d, EDD Estimated Date of Delivery: 06/29/18 who presented to triage for complaints of left sided abdominal/flankpain.  Of note, patient was diagnosed with a UTI (E. Coli) several weeks ago, but has not yet picked up her medication. She was evaluated by the nurses with no significant findings for pyelonephritis. Vital signs stable. An NST was performed and has been reviewed by MD. She was treated with PO Tylenol 1000 mg, 1 dose of IM Rocephin 1 gram, and 1 dose PO Macrobid 100 mg.   NST INTERPRETATION: Indications: rule out uterine contractions  Mode: Doppler  Baseline Rate (A): 135 bpm Variability: Moderate   Decelerations: None       Impression: reactive   Plan: NST performed was reviewed and was found to be reactive. She was discharged home with preterm labor precautions. She was also instructed to pick up prescription for Macrobid to continue her treatment. Advised on Tylenol prn. To return for fevers, chills, worsening back pain.   Continue routine prenatal care. Follow up with OB/GYN as previously scheduled.    Hildred LaserAnika Ahyana Skillin, MD  [facility]

## 2018-02-25 NOTE — Telephone Encounter (Signed)
Th patient called and stated that she would like to speak with someone in regards to a medication. Please advise.

## 2018-03-03 NOTE — Telephone Encounter (Signed)
Pt called LM via voicemail to call the office to discuss the message that she left.

## 2018-03-05 NOTE — Telephone Encounter (Signed)
Pt was called and she went to the ED due to not feeling well. Pt stated that she was feeling better and will be in for her next visit.

## 2018-03-11 ENCOUNTER — Ambulatory Visit (INDEPENDENT_AMBULATORY_CARE_PROVIDER_SITE_OTHER): Payer: Medicaid Other | Admitting: Obstetrics and Gynecology

## 2018-03-11 ENCOUNTER — Encounter: Payer: Self-pay | Admitting: Obstetrics and Gynecology

## 2018-03-11 VITALS — BP 117/77 | HR 106 | Wt 160.2 lb

## 2018-03-11 DIAGNOSIS — N3091 Cystitis, unspecified with hematuria: Secondary | ICD-10-CM

## 2018-03-11 DIAGNOSIS — Z3482 Encounter for supervision of other normal pregnancy, second trimester: Secondary | ICD-10-CM

## 2018-03-11 LAB — POCT URINALYSIS DIPSTICK
GLUCOSE UA: NEGATIVE
Nitrite, UA: NEGATIVE
Protein, UA: POSITIVE — AB
SPEC GRAV UA: 1.02 (ref 1.010–1.025)
Urobilinogen, UA: 0.2 E.U./dL
pH, UA: 6.5 (ref 5.0–8.0)

## 2018-03-11 MED ORDER — NITROFURANTOIN MONOHYD MACRO 100 MG PO CAPS: 100.0000 mg | ORAL_CAPSULE | Freq: Two times a day (BID) | ORAL | 1 refills | Status: DC

## 2018-03-11 NOTE — Progress Notes (Signed)
ROB: Recent brief hospitalization for E. coli sepsis. (ED?).  Hematuria noted today.  I will treat with Macrobid and send urine for C&S for confirmation.  If UTI consider daily prophylaxis going forward.  1 hour GCT next visit -patient became very sick last pregnancy and vomited x3 before finally able to drink the Glucola.  Is greatly interested in alternatives.  Literature given. Schedule follow-up growth ultrasound after 28 weeks because of two-vessel cord.

## 2018-03-13 LAB — URINE CULTURE

## 2018-04-08 ENCOUNTER — Other Ambulatory Visit: Payer: Medicaid Other

## 2018-04-08 ENCOUNTER — Ambulatory Visit (INDEPENDENT_AMBULATORY_CARE_PROVIDER_SITE_OTHER): Payer: Medicaid Other | Admitting: Obstetrics and Gynecology

## 2018-04-08 VITALS — BP 104/66 | HR 102 | Wt 168.2 lb

## 2018-04-08 DIAGNOSIS — Z3A28 28 weeks gestation of pregnancy: Secondary | ICD-10-CM

## 2018-04-08 DIAGNOSIS — Z130 Encounter for screening for diseases of the blood and blood-forming organs and certain disorders involving the immune mechanism: Secondary | ICD-10-CM

## 2018-04-08 DIAGNOSIS — Z131 Encounter for screening for diabetes mellitus: Secondary | ICD-10-CM

## 2018-04-08 DIAGNOSIS — Z3483 Encounter for supervision of other normal pregnancy, third trimester: Secondary | ICD-10-CM

## 2018-04-08 DIAGNOSIS — Z23 Encounter for immunization: Secondary | ICD-10-CM

## 2018-04-08 LAB — POCT URINALYSIS DIPSTICK
BILIRUBIN UA: NEGATIVE
Blood, UA: NEGATIVE
Glucose, UA: NEGATIVE
KETONES UA: NEGATIVE
Leukocytes, UA: NEGATIVE
Nitrite, UA: NEGATIVE
Protein, UA: POSITIVE — AB
SPEC GRAV UA: 1.025 (ref 1.010–1.025)
UROBILINOGEN UA: 0.2 U/dL
pH, UA: 6 (ref 5.0–8.0)

## 2018-04-08 MED ORDER — TETANUS-DIPHTH-ACELL PERTUSSIS 5-2.5-18.5 LF-MCG/0.5 IM SUSP
0.5000 mL | Freq: Once | INTRAMUSCULAR | Status: AC
  Administered 2018-04-08: 0.5 mL via INTRAMUSCULAR

## 2018-04-08 NOTE — Patient Instructions (Signed)

## 2018-04-08 NOTE — Progress Notes (Signed)
ROB-pt stated that she is doing well no complaints.  

## 2018-04-08 NOTE — Progress Notes (Signed)
ROB: Patient doing well, no complaints. For 28 week labs today.  Desires to bottle feed (does not desire to remove nipple rings) but may consider, given info. Desires Depo Provera for contraception. Desires epidural for pain relief, and discussed all other pain relief options. For Tdap today, signed blood consent. RTC in 2 weeks.

## 2018-04-09 LAB — CBC
Hematocrit: 35.5 % (ref 34.0–46.6)
Hemoglobin: 11.9 g/dL (ref 11.1–15.9)
MCH: 30.1 pg (ref 26.6–33.0)
MCHC: 33.5 g/dL (ref 31.5–35.7)
MCV: 90 fL (ref 79–97)
Platelets: 233 10*3/uL (ref 150–450)
RBC: 3.95 x10E6/uL (ref 3.77–5.28)
RDW: 11.7 % — ABNORMAL LOW (ref 12.3–15.4)
WBC: 11.2 10*3/uL — ABNORMAL HIGH (ref 3.4–10.8)

## 2018-04-09 LAB — RPR: RPR Ser Ql: NONREACTIVE

## 2018-04-09 LAB — GLUCOSE, 1 HOUR GESTATIONAL: GESTATIONAL DIABETES SCREEN: 89 mg/dL (ref 65–139)

## 2018-04-22 ENCOUNTER — Encounter: Payer: Self-pay | Admitting: Obstetrics and Gynecology

## 2018-04-22 ENCOUNTER — Ambulatory Visit (INDEPENDENT_AMBULATORY_CARE_PROVIDER_SITE_OTHER): Payer: Medicaid Other | Admitting: Obstetrics and Gynecology

## 2018-04-22 VITALS — BP 99/56 | HR 85 | Wt 171.0 lb

## 2018-04-22 DIAGNOSIS — Z3483 Encounter for supervision of other normal pregnancy, third trimester: Secondary | ICD-10-CM

## 2018-04-22 NOTE — Progress Notes (Signed)
ROB: Patient without complaint.  Patient considering epidural during labor.

## 2018-04-22 NOTE — Progress Notes (Signed)
Pt presents today for routine prenatal care. Pt states no concerns at this time.

## 2018-05-13 ENCOUNTER — Ambulatory Visit (INDEPENDENT_AMBULATORY_CARE_PROVIDER_SITE_OTHER): Payer: Medicaid Other | Admitting: Obstetrics and Gynecology

## 2018-05-13 VITALS — BP 111/74 | HR 74 | Wt 176.2 lb

## 2018-05-13 DIAGNOSIS — Z3483 Encounter for supervision of other normal pregnancy, third trimester: Secondary | ICD-10-CM | POA: Diagnosis not present

## 2018-05-13 DIAGNOSIS — Z23 Encounter for immunization: Secondary | ICD-10-CM | POA: Diagnosis not present

## 2018-05-13 LAB — POCT URINALYSIS DIPSTICK OB
BILIRUBIN UA: NEGATIVE
Blood, UA: NEGATIVE
GLUCOSE, UA: NEGATIVE
KETONES UA: NEGATIVE
Leukocytes, UA: NEGATIVE
NITRITE UA: NEGATIVE
PROTEIN: NEGATIVE
Urobilinogen, UA: 0.2 E.U./dL
pH, UA: 7.5 (ref 5.0–8.0)

## 2018-05-13 NOTE — Progress Notes (Signed)
ROB-PT stated that she is having some pressure in the vaginal area. Pt also stated having some pain in the abd area no other problems.

## 2018-05-13 NOTE — Progress Notes (Signed)
ROB: Patient noting pressure in vaginal area last week, but not bothersome today. Given reassurance. Flu vaccine given today. RTC in 3 weeks.

## 2018-06-03 ENCOUNTER — Ambulatory Visit (INDEPENDENT_AMBULATORY_CARE_PROVIDER_SITE_OTHER): Payer: Medicaid Other | Admitting: Obstetrics and Gynecology

## 2018-06-03 VITALS — BP 118/76 | HR 76 | Wt 181.0 lb

## 2018-06-03 DIAGNOSIS — Q270 Congenital absence and hypoplasia of umbilical artery: Secondary | ICD-10-CM

## 2018-06-03 DIAGNOSIS — Z3483 Encounter for supervision of other normal pregnancy, third trimester: Secondary | ICD-10-CM

## 2018-06-03 DIAGNOSIS — O358XX0 Maternal care for other (suspected) fetal abnormality and damage, not applicable or unspecified: Secondary | ICD-10-CM

## 2018-06-03 LAB — POCT URINALYSIS DIPSTICK OB
BILIRUBIN UA: NEGATIVE
Glucose, UA: NEGATIVE
Ketones, UA: NEGATIVE
LEUKOCYTES UA: NEGATIVE
Nitrite, UA: NEGATIVE
PH UA: 6.5 (ref 5.0–8.0)
POC,PROTEIN,UA: NEGATIVE
RBC UA: NEGATIVE
SPEC GRAV UA: 1.015 (ref 1.010–1.025)
UROBILINOGEN UA: 0.2 U/dL

## 2018-06-03 NOTE — Progress Notes (Signed)
ROB:  U/S for growth - 2VC.  Size less than dates.  GBS GC/CT performed.

## 2018-06-03 NOTE — Progress Notes (Signed)
Pt presents today for ROB. Pt requests a measurement of her abdomen.

## 2018-06-05 ENCOUNTER — Ambulatory Visit (INDEPENDENT_AMBULATORY_CARE_PROVIDER_SITE_OTHER): Payer: Medicaid Other

## 2018-06-05 DIAGNOSIS — Q270 Congenital absence and hypoplasia of umbilical artery: Secondary | ICD-10-CM

## 2018-06-05 DIAGNOSIS — O358XX0 Maternal care for other (suspected) fetal abnormality and damage, not applicable or unspecified: Secondary | ICD-10-CM

## 2018-06-05 DIAGNOSIS — Z3A37 37 weeks gestation of pregnancy: Secondary | ICD-10-CM | POA: Diagnosis not present

## 2018-06-05 LAB — GC/CHLAMYDIA PROBE AMP
Chlamydia trachomatis, NAA: NEGATIVE
Neisseria gonorrhoeae by PCR: NEGATIVE

## 2018-06-05 LAB — STREP GP B NAA: Strep Gp B NAA: POSITIVE — AB

## 2018-06-05 NOTE — Progress Notes (Deleted)
Pt presents today for NST

## 2018-06-09 ENCOUNTER — Encounter: Payer: Medicaid Other | Admitting: Obstetrics and Gynecology

## 2018-06-09 ENCOUNTER — Other Ambulatory Visit: Payer: Medicaid Other

## 2018-06-09 ENCOUNTER — Ambulatory Visit (INDEPENDENT_AMBULATORY_CARE_PROVIDER_SITE_OTHER): Payer: Medicaid Other | Admitting: Obstetrics and Gynecology

## 2018-06-09 ENCOUNTER — Encounter: Payer: Self-pay | Admitting: Obstetrics and Gynecology

## 2018-06-09 VITALS — BP 134/84 | HR 81 | Wt 182.0 lb

## 2018-06-09 DIAGNOSIS — Z3A37 37 weeks gestation of pregnancy: Secondary | ICD-10-CM

## 2018-06-09 DIAGNOSIS — Z3483 Encounter for supervision of other normal pregnancy, third trimester: Secondary | ICD-10-CM

## 2018-06-09 DIAGNOSIS — Q270 Congenital absence and hypoplasia of umbilical artery: Secondary | ICD-10-CM | POA: Diagnosis not present

## 2018-06-09 DIAGNOSIS — O358XX0 Maternal care for other (suspected) fetal abnormality and damage, not applicable or unspecified: Secondary | ICD-10-CM

## 2018-06-09 LAB — POCT URINALYSIS DIPSTICK OB
Bilirubin, UA: NEGATIVE
Glucose, UA: NEGATIVE
Ketones, UA: NEGATIVE
Leukocytes, UA: NEGATIVE
NITRITE UA: NEGATIVE
PH UA: 7 (ref 5.0–8.0)
PROTEIN: NEGATIVE
Spec Grav, UA: 1.015 (ref 1.010–1.025)
UROBILINOGEN UA: 0.2 U/dL

## 2018-06-09 NOTE — Progress Notes (Signed)
ROB: Patient presents today for NST due to single umbilical artery and low amniotic fluid index.  Patient has no complaints.  She denies contractions.  Last ultrasound revealed 63rd percentile growth.  Plan follow-up NST for Thursday or Friday.  Follow-up ultrasound AFI Tuesday with visit.  NONSTRESS TEST INTERPRETATION  INDICATIONS: Single umbilical artery, low AFI. FHR baseline: 120 RESULTS:  reactive COMMENTS:   PLAN: 1. Continue fetal kick counts as directed. 2. Continue antepartum testing as scheduled.  Elonda Husky, M.D. 06/09/2018 11:10 AM

## 2018-06-09 NOTE — Progress Notes (Signed)
Pt preesents today for ROB and NST. Pt is feeling well and has no complaints.

## 2018-06-10 ENCOUNTER — Encounter: Payer: Medicaid Other | Admitting: Obstetrics and Gynecology

## 2018-06-10 ENCOUNTER — Other Ambulatory Visit: Payer: Self-pay | Admitting: Obstetrics and Gynecology

## 2018-06-10 DIAGNOSIS — O288 Other abnormal findings on antenatal screening of mother: Secondary | ICD-10-CM

## 2018-06-12 ENCOUNTER — Ambulatory Visit: Payer: Medicaid Other

## 2018-06-12 ENCOUNTER — Ambulatory Visit (INDEPENDENT_AMBULATORY_CARE_PROVIDER_SITE_OTHER): Payer: Medicaid Other | Admitting: Obstetrics and Gynecology

## 2018-06-12 VITALS — BP 157/93 | HR 89 | Wt 182.0 lb

## 2018-06-12 DIAGNOSIS — Z3483 Encounter for supervision of other normal pregnancy, third trimester: Secondary | ICD-10-CM

## 2018-06-12 DIAGNOSIS — O288 Other abnormal findings on antenatal screening of mother: Secondary | ICD-10-CM

## 2018-06-12 DIAGNOSIS — Z3A37 37 weeks gestation of pregnancy: Secondary | ICD-10-CM | POA: Diagnosis not present

## 2018-06-12 DIAGNOSIS — O163 Unspecified maternal hypertension, third trimester: Secondary | ICD-10-CM

## 2018-06-12 DIAGNOSIS — O358XX0 Maternal care for other (suspected) fetal abnormality and damage, not applicable or unspecified: Secondary | ICD-10-CM

## 2018-06-12 DIAGNOSIS — Q270 Congenital absence and hypoplasia of umbilical artery: Secondary | ICD-10-CM

## 2018-06-12 LAB — POCT URINALYSIS DIPSTICK OB
Bilirubin, UA: NEGATIVE
Glucose, UA: NEGATIVE
KETONES UA: NEGATIVE
Nitrite, UA: NEGATIVE
PH UA: 7.5 (ref 5.0–8.0)
Spec Grav, UA: 1.01 (ref 1.010–1.025)
UROBILINOGEN UA: 0.2 U/dL

## 2018-06-12 NOTE — Progress Notes (Signed)
Patient presents for repeat NST for single umbilical artery and borderline AFI (7.1). NST performed today was reviewed and was found to be reactive.  Elevated blood pressures noted on today's NST. Continue recommended antenatal testing and prenatal care.  Has scheduled repeat ultrasound next week to assess AFI. Will likely need to discuss future plans for IOL at that visit.  Will need pre-eclampsia labs (patient left without getting labs today).   NONSTRESS TEST INTERPRETATION  INDICATIONS:    FHR baseline: 125 bpm RESULTS:Reactive COMMENTS: 1 contraction noted.  Blood pressures 151/98, 154/91, 147/89, 154/79.    PLAN: 1. Continue fetal kick counts twice a day. 2. Continue antepartum testing as scheduled-Biweekly.  Needs baseline pre-eclampsia labs.

## 2018-06-12 NOTE — Progress Notes (Signed)
Pt presents today for NST. Pt complains of fluid leaking from vagina.

## 2018-06-13 ENCOUNTER — Observation Stay
Admission: EM | Admit: 2018-06-13 | Discharge: 2018-06-13 | Disposition: A | Discharge status: Home / Self Care | Payer: Medicaid Other | Attending: Obstetrics and Gynecology | Admitting: Obstetrics and Gynecology

## 2018-06-13 ENCOUNTER — Other Ambulatory Visit: Payer: Self-pay

## 2018-06-13 DIAGNOSIS — O9989 Other specified diseases and conditions complicating pregnancy, childbirth and the puerperium: Principal | ICD-10-CM | POA: Insufficient documentation

## 2018-06-13 DIAGNOSIS — Z3A37 37 weeks gestation of pregnancy: Secondary | ICD-10-CM

## 2018-06-13 DIAGNOSIS — O471 False labor at or after 37 completed weeks of gestation: Secondary | ICD-10-CM

## 2018-06-13 DIAGNOSIS — N898 Other specified noninflammatory disorders of vagina: Secondary | ICD-10-CM | POA: Diagnosis not present

## 2018-06-13 LAB — ROM PLUS (ARMC ONLY): ROM PLUS: NEGATIVE

## 2018-06-13 NOTE — OB Triage Note (Signed)
Pt was seen in the office yesterday and was told she has a low AFI. She has had a steady leak of thin clear fluid for two days.

## 2018-06-13 NOTE — Discharge Summary (Signed)
    L&D OB Triage Note  SUBJECTIVE Laura Sosa is a 32 y.o. G2P1001 female at [redacted]w[redacted]d, EDD Estimated Date of Delivery: 06/29/18 who presented to triage with complaints of vaginal discharge. No contractions.  No bleeding.  Baby moving.    OB History  Gravida Para Term Preterm AB Living  2 1 1  0 0 1  SAB TAB Ectopic Multiple Live Births  0 0 0 0 1    # Outcome Date GA Lbr Len/2nd Weight Sex Delivery Anes PTL Lv  2 Current           1 Term 2011   3799 g F Vag-Spont   LIV    Obstetric Comments  G1 - diagnosed with cleft palate at age 25    Medications Prior to Admission  Medication Sig Dispense Refill Last Dose  . prenatal vitamin w/FE, FA (PRENATAL 1 + 1) 27-1 MG TABS tablet Take 1 tablet by mouth daily at 12 noon.   Taking     OBJECTIVE  Nursing Evaluation:   BP (!) 155/107 (BP Location: Left Arm)   Pulse 76   Temp 98.1 F (36.7 C) (Oral)   Resp 18   Ht 5\' 3"  (1.6 m)   Wt 82.6 kg   LMP 09/22/2017 (Exact Date)   BMI 32.24 kg/m    Findings:   NTZ - negative, Fern negative, ROM plus Negative     WET PREP: clue cells: absent, KOH (yeast): negative, odor: absent and trichomoniasis: negative Ph:  < 4.5   NST was performed and has been reviewed by me.  Pt was personally seen by me and I performed the wet prep and fern.  NST INTERPRETATION: Category I  Mode: External Baseline Rate (A): 130 bpm Variability: Moderate Accelerations: 15 x 15 Decelerations: None     Contraction Frequency (min): occasional  ASSESSMENT Impression:  1.  Pregnancy:  G2P1001 at [redacted]w[redacted]d , EDD Estimated Date of Delivery: 06/29/18 2.  NST:  Category I  3.  No evidence of ROM - asymptomatic discharge  PLAN 1. Reassurance given 2. Discharge home with standard labor precautions given to return to L&D or call the office for problems. 3. Continue routine prenatal care.

## 2018-06-17 ENCOUNTER — Inpatient Hospital Stay: Payer: Medicaid Other | Admitting: Anesthesiology

## 2018-06-17 ENCOUNTER — Ambulatory Visit (INDEPENDENT_AMBULATORY_CARE_PROVIDER_SITE_OTHER): Payer: Medicaid Other

## 2018-06-17 ENCOUNTER — Ambulatory Visit (INDEPENDENT_AMBULATORY_CARE_PROVIDER_SITE_OTHER): Payer: Medicaid Other | Admitting: Obstetrics and Gynecology

## 2018-06-17 ENCOUNTER — Inpatient Hospital Stay
Admission: RE | Admit: 2018-06-17 | Discharge: 2018-06-20 | DRG: 998 | Disposition: A | Discharge status: Home / Self Care | Payer: Medicaid Other | Attending: Obstetrics and Gynecology | Admitting: Obstetrics and Gynecology

## 2018-06-17 ENCOUNTER — Encounter: Payer: Self-pay | Admitting: Obstetrics and Gynecology

## 2018-06-17 ENCOUNTER — Other Ambulatory Visit: Payer: Self-pay

## 2018-06-17 VITALS — BP 143/104 | HR 78 | Wt 183.0 lb

## 2018-06-17 DIAGNOSIS — O134 Gestational [pregnancy-induced] hypertension without significant proteinuria, complicating childbirth: Principal | ICD-10-CM | POA: Diagnosis present

## 2018-06-17 DIAGNOSIS — O4100X0 Oligohydramnios, unspecified trimester, not applicable or unspecified: Secondary | ICD-10-CM | POA: Diagnosis present

## 2018-06-17 DIAGNOSIS — O99824 Streptococcus B carrier state complicating childbirth: Secondary | ICD-10-CM | POA: Diagnosis present

## 2018-06-17 DIAGNOSIS — O4103X0 Oligohydramnios, third trimester, not applicable or unspecified: Secondary | ICD-10-CM

## 2018-06-17 DIAGNOSIS — O133 Gestational [pregnancy-induced] hypertension without significant proteinuria, third trimester: Secondary | ICD-10-CM

## 2018-06-17 DIAGNOSIS — Z3A38 38 weeks gestation of pregnancy: Secondary | ICD-10-CM

## 2018-06-17 DIAGNOSIS — Z3483 Encounter for supervision of other normal pregnancy, third trimester: Secondary | ICD-10-CM

## 2018-06-17 LAB — CBC
HEMATOCRIT: 35.8 % — AB (ref 36.0–46.0)
HEMOGLOBIN: 12.2 g/dL (ref 12.0–15.0)
MCH: 30.2 pg (ref 26.0–34.0)
MCHC: 34.1 g/dL (ref 30.0–36.0)
MCV: 88.6 fL (ref 80.0–100.0)
Platelets: 218 10*3/uL (ref 150–400)
RBC: 4.04 MIL/uL (ref 3.87–5.11)
RDW: 12.2 % (ref 11.5–15.5)
WBC: 10.3 10*3/uL (ref 4.0–10.5)
nRBC: 0 % (ref 0.0–0.2)

## 2018-06-17 LAB — RAPID HIV SCREEN (HIV 1/2 AB+AG)
HIV 1/2 ANTIBODIES: NONREACTIVE
HIV-1 P24 ANTIGEN - HIV24: NONREACTIVE

## 2018-06-17 LAB — POCT URINALYSIS DIPSTICK OB
Bilirubin, UA: NEGATIVE
GLUCOSE, UA: NEGATIVE
Ketones, UA: NEGATIVE
LEUKOCYTES UA: NEGATIVE
Nitrite, UA: NEGATIVE
POC,PROTEIN,UA: NEGATIVE
Spec Grav, UA: 1.015 (ref 1.010–1.025)
UROBILINOGEN UA: 0.2 U/dL
pH, UA: 7 (ref 5.0–8.0)

## 2018-06-17 LAB — TYPE AND SCREEN
ABO/RH(D): A POS
Antibody Screen: NEGATIVE

## 2018-06-17 MED ORDER — LABETALOL HCL 5 MG/ML IV SOLN
20.0000 mg | INTRAVENOUS | Status: DC | PRN
  Administered 2018-06-17: 20 mg via INTRAVENOUS

## 2018-06-17 MED ORDER — EPHEDRINE 5 MG/ML INJ: 10.0000 mg | INTRAVENOUS | Status: DC | PRN

## 2018-06-17 MED ORDER — OXYTOCIN BOLUS FROM INFUSION: 500.0000 mL | Freq: Once | INTRAVENOUS | Status: DC

## 2018-06-17 MED ORDER — MISOPROSTOL 25 MCG QUARTER TABLET
ORAL_TABLET | ORAL | Status: AC
  Administered 2018-06-17: 50 ug
  Filled 2018-06-17: qty 1

## 2018-06-17 MED ORDER — DIPHENHYDRAMINE HCL 25 MG PO CAPS: 25.0000 mg | ORAL_CAPSULE | Freq: Four times a day (QID) | ORAL | Status: DC | PRN

## 2018-06-17 MED ORDER — DIPHENHYDRAMINE HCL 50 MG/ML IJ SOLN: 12.5000 mg | INTRAMUSCULAR | Status: DC | PRN

## 2018-06-17 MED ORDER — SODIUM CHLORIDE 0.9 % IV SOLN
INTRAVENOUS | Status: AC
  Filled 2018-06-17: qty 2000

## 2018-06-17 MED ORDER — LABETALOL HCL 5 MG/ML IV SOLN
INTRAVENOUS | Status: AC
  Filled 2018-06-17: qty 4

## 2018-06-17 MED ORDER — LABETALOL HCL 5 MG/ML IV SOLN
80.0000 mg | INTRAVENOUS | Status: DC | PRN
  Administered 2018-06-17: 80 mg via INTRAVENOUS
  Filled 2018-06-17: qty 16

## 2018-06-17 MED ORDER — BUTORPHANOL TARTRATE 2 MG/ML IJ SOLN: 1.0000 mg | INTRAMUSCULAR | Status: DC | PRN

## 2018-06-17 MED ORDER — OXYTOCIN 10 UNIT/ML IJ SOLN
INTRAMUSCULAR | Status: AC
  Filled 2018-06-17: qty 2

## 2018-06-17 MED ORDER — SODIUM CHLORIDE 0.9 % IV SOLN: 1.0000 g | INTRAVENOUS | Status: DC

## 2018-06-17 MED ORDER — FENTANYL 2.5 MCG/ML W/ROPIVACAINE 0.15% IN NS 100 ML EPIDURAL (ARMC)
12.0000 mL/h | EPIDURAL | Status: DC
  Administered 2018-06-17: 12 mL/h via EPIDURAL

## 2018-06-17 MED ORDER — ACETAMINOPHEN 325 MG PO TABS
650.0000 mg | ORAL_TABLET | ORAL | Status: DC | PRN
  Administered 2018-06-18: 650 mg via ORAL
  Filled 2018-06-17: qty 2

## 2018-06-17 MED ORDER — MISOPROSTOL 50MCG HALF TABLET
ORAL_TABLET | ORAL | Status: AC
  Filled 2018-06-17: qty 1

## 2018-06-17 MED ORDER — AMMONIA AROMATIC IN INHA
RESPIRATORY_TRACT | Status: AC
  Filled 2018-06-17: qty 10

## 2018-06-17 MED ORDER — MISOPROSTOL 200 MCG PO TABS
ORAL_TABLET | ORAL | Status: AC
  Filled 2018-06-17: qty 4

## 2018-06-17 MED ORDER — BUPIVACAINE HCL (PF) 0.25 % IJ SOLN
INTRAMUSCULAR | Status: DC | PRN
  Administered 2018-06-17 (×2): 4 mL via EPIDURAL

## 2018-06-17 MED ORDER — LABETALOL HCL 5 MG/ML IV SOLN
40.0000 mg | INTRAVENOUS | Status: DC | PRN
  Administered 2018-06-17: 40 mg via INTRAVENOUS
  Filled 2018-06-17: qty 8

## 2018-06-17 MED ORDER — ACETAMINOPHEN 325 MG PO TABS: 650.0000 mg | ORAL_TABLET | ORAL | Status: DC | PRN

## 2018-06-17 MED ORDER — HYDRALAZINE HCL 20 MG/ML IJ SOLN
10.0000 mg | INTRAMUSCULAR | Status: DC | PRN
  Administered 2018-06-17: 10 mg via INTRAVENOUS
  Filled 2018-06-17: qty 1

## 2018-06-17 MED ORDER — MISOPROSTOL 50MCG HALF TABLET
50.0000 ug | ORAL_TABLET | ORAL | Status: DC | PRN
  Administered 2018-06-17: 50 ug via VAGINAL
  Filled 2018-06-17: qty 1

## 2018-06-17 MED ORDER — LACTATED RINGERS IV SOLN
500.0000 mL | Freq: Once | INTRAVENOUS | Status: AC
  Administered 2018-06-17: 500 mL via INTRAVENOUS

## 2018-06-17 MED ORDER — LIDOCAINE HCL (PF) 1 % IJ SOLN
INTRAMUSCULAR | Status: DC | PRN
  Administered 2018-06-17: 3 mL

## 2018-06-17 MED ORDER — SOD CITRATE-CITRIC ACID 500-334 MG/5ML PO SOLN: 30.0000 mL | ORAL | Status: DC | PRN

## 2018-06-17 MED ORDER — PHENYLEPHRINE 40 MCG/ML (10ML) SYRINGE FOR IV PUSH (FOR BLOOD PRESSURE SUPPORT): 80.0000 ug | PREFILLED_SYRINGE | INTRAVENOUS | Status: DC | PRN

## 2018-06-17 MED ORDER — ONDANSETRON HCL 4 MG/2ML IJ SOLN: 4.0000 mg | Freq: Four times a day (QID) | INTRAMUSCULAR | Status: DC | PRN

## 2018-06-17 MED ORDER — BENZOCAINE-MENTHOL 20-0.5 % EX AERO: 1.0000 "application " | INHALATION_SPRAY | CUTANEOUS | Status: DC | PRN

## 2018-06-17 MED ORDER — FENTANYL 2.5 MCG/ML W/ROPIVACAINE 0.15% IN NS 100 ML EPIDURAL (ARMC)
EPIDURAL | Status: AC
  Filled 2018-06-17: qty 100

## 2018-06-17 MED ORDER — LACTATED RINGERS IV SOLN
500.0000 mL | INTRAVENOUS | Status: DC | PRN
  Administered 2018-06-17: 1000 mL via INTRAVENOUS

## 2018-06-17 MED ORDER — IBUPROFEN 600 MG PO TABS
600.0000 mg | ORAL_TABLET | Freq: Four times a day (QID) | ORAL | Status: DC
  Filled 2018-06-17 (×2): qty 1

## 2018-06-17 MED ORDER — LIDOCAINE-EPINEPHRINE (PF) 1.5 %-1:200000 IJ SOLN
INTRAMUSCULAR | Status: DC | PRN
  Administered 2018-06-17: 3 mL via PERINEURAL

## 2018-06-17 MED ORDER — SODIUM CHLORIDE 0.9 % IV SOLN
2.0000 g | Freq: Once | INTRAVENOUS | Status: AC
  Administered 2018-06-17: 2 g via INTRAVENOUS

## 2018-06-17 MED ORDER — LIDOCAINE HCL (PF) 1 % IJ SOLN
30.0000 mL | INTRAMUSCULAR | Status: DC | PRN
  Filled 2018-06-17: qty 30

## 2018-06-17 MED ORDER — OXYTOCIN 40 UNITS IN LACTATED RINGERS INFUSION - SIMPLE MED
2.5000 [IU]/h | INTRAVENOUS | Status: DC
  Administered 2018-06-17 (×2): 2.5 [IU]/h via INTRAVENOUS
  Filled 2018-06-17: qty 1000

## 2018-06-17 MED ORDER — LACTATED RINGERS IV SOLN
INTRAVENOUS | Status: DC
  Administered 2018-06-17 (×3): via INTRAVENOUS

## 2018-06-17 NOTE — Anesthesia Procedure Notes (Signed)
Epidural Patient location during procedure: OB Start time: 06/17/2018 6:23 PM End time: 06/17/2018 6:27 PM  Staffing Anesthesiologist: Piscitello, Cleda Mccreedy, MD Resident/CRNA: Stormy Fabian, CRNA Performed: resident/CRNA   Preanesthetic Checklist Completed: patient identified, site marked, surgical consent, pre-op evaluation, timeout performed, IV checked, risks and benefits discussed and monitors and equipment checked  Epidural Patient position: sitting Prep: ChloraPrep Patient monitoring: heart rate, continuous pulse ox and blood pressure Approach: midline Location: L3-L4 Injection technique: LOR saline  Needle:  Needle type: Tuohy  Needle gauge: 17 G Needle length: 9 cm and 9 Needle insertion depth: 6 cm Catheter type: closed end flexible Catheter size: 19 Gauge Catheter at skin depth: 11 cm Test dose: negative and 1.5% lidocaine with Epi 1:200 K  Assessment Sensory level: T10 Events: blood not aspirated, injection not painful, no injection resistance, negative IV test and no paresthesia  Additional Notes 1 attempt Pt. Evaluated and documentation done after procedure finished. Patient identified. Risks/Benefits/Options discussed with patient including but not limited to bleeding, infection, nerve damage, paralysis, failed block, incomplete pain control, headache, blood pressure changes, nausea, vomiting, reactions to medication both or allergic, itching and postpartum back pain. Confirmed with bedside nurse the patient's most recent platelet count. Confirmed with patient that they are not currently taking any anticoagulation, have any bleeding history or any family history of bleeding disorders. Patient expressed understanding and wished to proceed. All questions were answered. Sterile technique was used throughout the entire procedure. Please see nursing notes for vital signs. Test dose was given through epidural catheter and negative prior to continuing to dose epidural or  start infusion. Warning signs of high block given to the patient including shortness of breath, tingling/numbness in hands, complete motor block, or any concerning symptoms with instructions to call for help. Patient was given instructions on fall risk and not to get out of bed. All questions and concerns addressed with instructions to call with any issues or inadequate analgesia.   Patient tolerated the insertion well without immediate complications.Reason for block:procedure for pain

## 2018-06-17 NOTE — Progress Notes (Signed)
Patient ID: Laura Sosa, female   DOB: 1985-11-15, 32 y.o.   MRN: [id number]   Misoprostol placed intravaginally.  Fluid bolus.  Epidural when desired. Expect vaginal delivery.

## 2018-06-17 NOTE — Anesthesia Preprocedure Evaluation (Signed)
Anesthesia Evaluation  Patient identified by MRN, date of birth, ID band Patient awake    Reviewed: Allergy & Precautions, H&P , NPO status , Patient's Chart, lab work & pertinent test results  History of Anesthesia Complications Negative for: history of anesthetic complications  Airway Mallampati: II  TM Distance: >3 FB Neck ROM: full    Dental no notable dental hx.    Pulmonary neg pulmonary ROS,    Pulmonary exam normal        Cardiovascular negative cardio ROS Normal cardiovascular exam     Neuro/Psych  Headaches, negative psych ROS   GI/Hepatic negative GI ROS, Neg liver ROS,   Endo/Other  negative endocrine ROS  Renal/GU negative Renal ROS     Musculoskeletal   Abdominal   Peds  Hematology negative hematology ROS (+)   Anesthesia Other Findings   Reproductive/Obstetrics (+) Pregnancy                             Anesthesia Physical Anesthesia Plan  ASA: II  Anesthesia Plan: Epidural   Post-op Pain Management:    Induction:   PONV Risk Score and Plan:   Airway Management Planned:   Additional Equipment:   Intra-op Plan:   Post-operative Plan:   Informed Consent:   Plan Discussed with: Anesthesiologist  Anesthesia Plan Comments:         Anesthesia Quick Evaluation

## 2018-06-17 NOTE — Progress Notes (Signed)
Pt here today for NST and ROB. Pt has stopped leaking fluid since Friday.

## 2018-06-17 NOTE — H&P (Signed)
History and Physical   HPI  Laura Sosa is a 32 y.o. G2P1001 at [redacted]w[redacted]d Estimated Date of Delivery: 06/29/18 who is being admitted for  Hypertension and oligohydramnios. Admitted for IOL  OB History  OB History  Gravida Para Term Preterm AB Living  2 1 1  0 0 1  SAB TAB Ectopic Multiple Live Births  0 0 0 0 1    # Outcome Date GA Lbr Len/2nd Weight Sex Delivery Anes PTL Lv  2 Current           1 Term 2011   3799 g F Vag-Spont   LIV    Obstetric Comments  G1 - diagnosed with cleft palate at age 29    PROBLEM LIST  Pregnancy complications or risks: Patient Active Problem List   Diagnosis Date Noted  . Indication for care in labor or delivery 06/13/2018  . UTI in pregnancy 02/25/2018  . 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 Infertility, female 12/18/2017  . History of pyelonephritis 12/18/2017  . History of kidney stones 12/18/2017  . Rubella non-immune status, antepartum 12/18/2017  . Hyperemesis affecting pregnancy, antepartum 12/18/2017  . FHx: cleft palate 12/18/2017   HTN - OLIGO  Prenatal labs and studies: ABO, Rh: A/Positive/-- (03/25 1025) Antibody: Negative (03/25 1025) Rubella: <0.90 (03/25 1025) RPR: Non Reactive (08/13 1528)  HBsAg: Negative (03/25 1025)  HIV: Non Reactive (03/25 1025)  ZOX:WRUEAVWU (10/08 1544)   Past Medical History:  Diagnosis Date  . Headache   . History of kidney stones   . Pyelonephritis 04/17/2016     Past Surgical History:  Procedure Laterality Date  . CYSTOSCOPY/RETROGRADE/URETEROSCOPY  03/15/2017   Procedure: CYSTOSCOPY/RETROGRADE/URETEROSCOPY;  Surgeon: Hildred Laser, MD;  Location: [facility] ORS;  Service: Urology;;     Medications    Current Discharge Medication List    CONTINUE these medications which have NOT CHANGED   Details  prenatal vitamin w/FE, FA (PRENATAL 1 + 1) 27-1 MG TABS tablet Take 1 tablet by mouth daily at 12 noon.         Allergies  Patient has no known  allergies.  Review of Systems  Pertinent items are noted in HPI.  Physical Exam  LMP 09/22/2017 (Exact Date)   Lungs:  CTA B Cardio: RRR without M/R/G Abd: Soft, gravid, NT Presentation: cephalic EXT: No C/C/ 1+ Edema DTRs: 2+ B CERVIX:  FT, soft, long mid  See Prenatal records for more detailed PE.     FHR:  Variability: Good {> 6 bpm)  Toco: Uterine Contractions: rare    Test Results  Results for orders placed or performed in visit on 06/17/18 (from the past 24 hour(s))  POC Urinalysis Dipstick OB     Status: None   Collection Time: 06/17/18 10:31 AM  Result Value Ref Range   Color, UA yellow    Clarity, UA clear    Glucose, UA Negative Negative   Bilirubin, UA negative    Ketones, UA negative    Spec Grav, UA 1.015 1.010 - 1.025   Blood, UA trace    pH, UA 7.0 5.0 - 8.0   POC Protein UA Negative Negative, Trace   Urobilinogen, UA 0.2 0.2 or 1.0 E.U./dL   Nitrite, UA negative    Leukocytes, UA Negative Negative   Appearance yellow    Odor none      Assessment   G2P1001 at [redacted]w[redacted]d Estimated Date of Delivery: 06/29/18  The fetus is reassuring.  IOL for Oligo and  HTN  Patient Active Problem List   Diagnosis Date Noted  . Indication for care in labor or delivery 06/13/2018  . UTI in pregnancy 02/25/2018  . 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 Infertility, female 12/18/2017  . History of pyelonephritis 12/18/2017  . History of kidney stones 12/18/2017  . Rubella non-immune status, antepartum 12/18/2017  . Hyperemesis affecting pregnancy, antepartum 12/18/2017  . FHx: cleft palate 12/18/2017    Plan  1. Admit to L&D :    2. EFM: -- Category 1 3. Epidural if desired.  Stadol for IV pain until epidural requested. 4. Admission labs    Elonda Husky, M.D. 06/17/2018 1:07 PM

## 2018-06-17 NOTE — Progress Notes (Signed)
   06/17/18 1500  Clinical Encounter Type  Visited With Patient  Visit Type Initial;Spiritual support  Recommendations Follow-up as needed.  Spiritual Encounters  Spiritual Needs Emotional;Prayer  Stress Factors  Patient Stress Factors  (Concerns for her unborn child.)   Patient requested prayer for her unborn child. Chaplain compiled with prayer and active listening.

## 2018-06-17 NOTE — Progress Notes (Signed)
ROB: Patient without complaint.  Ultrasound today reveals oligohydramnios.  Blood pressures still elevated. NONSTRESS TEST INTERPRETATION  INDICATIONS: PIH, oligo  RESULTS:  reactive COMMENTS:   PLAN: 1. Continue fetal kick counts as directed. 2. Continue antepartum testing as scheduled.  Elonda Husky, M.D. 06/17/2018 11:41 AM  PLAN: Patient to go immediately to labor and delivery for induction of labor for hypertension and oligohydramnios at term.

## 2018-06-18 LAB — RPR: RPR Ser Ql: NONREACTIVE

## 2018-06-18 MED ORDER — HYDRALAZINE HCL 20 MG/ML IJ SOLN: 10.0000 mg | Freq: Four times a day (QID) | INTRAMUSCULAR | Status: DC | PRN

## 2018-06-18 MED ORDER — ZOLPIDEM TARTRATE 5 MG PO TABS: 5.0000 mg | ORAL_TABLET | Freq: Every evening | ORAL | Status: DC | PRN

## 2018-06-18 MED ORDER — PRENATAL MULTIVITAMIN CH
1.0000 | ORAL_TABLET | Freq: Every day | ORAL | Status: DC
  Filled 2018-06-18 (×2): qty 1

## 2018-06-18 MED ORDER — LABETALOL HCL 20 MG/4ML IV SOSY: 20.0000 mg | PREFILLED_SYRINGE | Freq: Four times a day (QID) | INTRAVENOUS | Status: DC | PRN

## 2018-06-18 MED ORDER — DOCUSATE SODIUM 100 MG PO CAPS
100.0000 mg | ORAL_CAPSULE | Freq: Two times a day (BID) | ORAL | Status: DC
  Administered 2018-06-20: 100 mg via ORAL
  Filled 2018-06-18 (×2): qty 1

## 2018-06-18 MED ORDER — SIMETHICONE 80 MG PO CHEW: 80.0000 mg | CHEWABLE_TABLET | ORAL | Status: DC | PRN

## 2018-06-18 MED ORDER — OXYTOCIN 40 UNITS IN LACTATED RINGERS INFUSION - SIMPLE MED
2.5000 [IU]/h | INTRAVENOUS | Status: DC | PRN
  Filled 2018-06-18: qty 1000

## 2018-06-18 MED ORDER — TETANUS-DIPHTH-ACELL PERTUSSIS 5-2.5-18.5 LF-MCG/0.5 IM SUSP: 0.5000 mL | Freq: Once | INTRAMUSCULAR | Status: DC

## 2018-06-18 MED ORDER — IBUPROFEN 100 MG/5ML PO SUSP
600.0000 mg | Freq: Four times a day (QID) | ORAL | Status: DC
  Administered 2018-06-18 – 2018-06-20 (×3): 600 mg via ORAL
  Filled 2018-06-18 (×11): qty 30

## 2018-06-18 MED ORDER — OXYCODONE-ACETAMINOPHEN 5-325 MG PO TABS: 1.0000 | ORAL_TABLET | ORAL | Status: DC | PRN

## 2018-06-18 NOTE — Progress Notes (Signed)
Patient ID: Laura Sosa, female   DOB: 1985-09-17, 32 y.o.   MRN: [id number]  Progress Note - Vaginal Delivery  Laura R Gandolfi is a 32 y.o. G2P2002 now PP day 1 s/p Vaginal, Spontaneous .   Subjective:  The patient reports no complaints, up ad lib, voiding and tolerating PO   Objective:  Vital signs in last 24 hours: Temp:  [97.6 F (36.4 C)-98.2 F (36.8 C)] 98.2 F (36.8 C) (10/23 0754) Pulse Rate:  [55-208] 76 (10/23 0754) Resp:  [18] 18 (10/23 0754) BP: (111-166)/(67-111) 122/77 (10/23 0754) SpO2:  [95 %-100 %] 100 % (10/23 0754) Weight:  [83 kg] 83 kg (10/22 1318)  Physical Exam:  General: alert, cooperative and no distress Lochia: appropriate Uterine Fundus: firm DVT Evaluation: No evidence of DVT seen on physical exam.    Data Review Recent Labs    06/17/18 1340  HGB 12.2  HCT 35.8*    Assessment/Plan: Active Problems:   Oligohydramnios   Plan for discharge tomorrow   -- Continue routine PP care.     Elonda Husky, M.D. 06/18/2018 9:31 AM

## 2018-06-18 NOTE — Anesthesia Postprocedure Evaluation (Signed)
Anesthesia Post Note  Patient: Laura Sosa  Procedure(s) Performed: AN AD HOC LABOR EPIDURAL  Patient location during evaluation: [facility] Anesthesia Type: Epidural Level of consciousness: awake, awake and alert, oriented and patient cooperative Pain management: pain level controlled Vital Signs Assessment: post-procedure vital signs reviewed and stable Respiratory status: spontaneous breathing, nonlabored ventilation and respiratory function stable Cardiovascular status: stable Postop Assessment: no headache, no backache, patient able to bend at knees, no apparent nausea or vomiting and adequate PO intake Anesthetic complications: no     Last Vitals:  Vitals:   06/18/18 0754 06/18/18 1116  BP: 122/77 125/70  Pulse: 76 89  Resp: 18   Temp: 36.8 C (!) 36.3 C  SpO2: 100% 99%    Last Pain:  Vitals:   06/18/18 1116  TempSrc: Oral  PainSc:                  Lyn Records

## 2018-06-19 MED ORDER — NIFEDIPINE ER OSMOTIC RELEASE 30 MG PO TB24
30.0000 mg | ORAL_TABLET | Freq: Every day | ORAL | Status: DC
  Administered 2018-06-19 – 2018-06-20 (×2): 30 mg via ORAL
  Filled 2018-06-19 (×2): qty 1

## 2018-06-19 NOTE — Progress Notes (Signed)
Pt requesting liquid form of BP medication due to difficulty swallowing pills. Dr. Logan Bores notified and stated for patient to try to take pill. No new orders received. Patient updated.

## 2018-06-19 NOTE — Progress Notes (Signed)
Progress Note - Vaginal Delivery  Laura Sosa is a 32 y.o. G2P2002 now PP day 1.5 s/p Vaginal, Spontaneous .   Subjective:  The patient reports no complaints, up ad lib, voiding and tolerating PO Headache has resolved.  Objective:  Vital signs in last 24 hours: Temp:  [97.8 F (36.6 C)-98.2 F (36.8 C)] 97.8 F (36.6 C) (10/24 0742) Pulse Rate:  [57-85] 61 (10/24 0742) Resp:  [14-20] 20 (10/24 0742) BP: (135-162)/(83-105) 141/83 (10/24 0742) SpO2:  [90 %-99 %] 96 % (10/24 0742)  Physical Exam:  General: alert and cooperative Lochia: appropriate Uterine Fundus: firm DVT Evaluation: No evidence of DVT seen on physical exam.    Data Review Recent Labs    06/17/18 1340  HGB 12.2  HCT 35.8*    Assessment/Plan: Active Problems:   Oligohydramnios    PIH - pt with severe range Bps last night - resolved with labetalol.  Pressures still elevated today but not severe range. Plan for discharge tomorrow Start Procardia xl today and plan to continue at D/C  -- Continue routine PP care.     Elonda Husky, M.D. 06/19/2018 11:33 AM

## 2018-06-20 MED ORDER — MEASLES, MUMPS & RUBELLA VAC ~~LOC~~ INJ
0.5000 mL | INJECTION | Freq: Once | SUBCUTANEOUS | Status: AC
  Administered 2018-06-20: 0.5 mL via SUBCUTANEOUS
  Filled 2018-06-20: qty 0.5

## 2018-06-20 MED ORDER — NIFEDIPINE ER 30 MG PO TB24: 30.0000 mg | ORAL_TABLET | Freq: Every day | ORAL | 0 refills | Status: DC

## 2018-06-20 NOTE — Progress Notes (Signed)
Pt states that she received TDaP and Influenza vaccines during pregnancy. Reynold Bowen, RN 06/20/2018 2:14 PM

## 2018-06-20 NOTE — Discharge Summary (Signed)
                               Discharge Summary  Date of Admission: 06/17/2018  Date of Discharge: 06/20/2018  Admitting Diagnosis: Induction of labor at [redacted]w[redacted]d PIH and Oligo  Mode of Delivery: normal spontaneous vaginal delivery                 Discharge Diagnosis: Gestational hypertension    Intrapartum Procedures: Atificial rupture of membranes, epidural and GBS prophylaxis   Post partum procedures:   Complications: none                      Discharge Day SOAP Note:  Progress Note - Vaginal Delivery  Laura Sosa is a 32 y.o. G2P2002 now PP day 2 s/p Vaginal, Spontaneous . Delivery was complicated by hypertension and oligohydramnios  Subjective  The patient has the following complaints: has no unusual complaints  Pain is controlled with current medications.   Patient is urinating without difficulty.  She is ambulating well.     Objective  Vital signs: BP 129/89 (BP Location: Left Arm)   Pulse 72   Temp 97.6 F (36.4 C) (Oral)   Resp 18   Ht 5\' 3"  (1.6 m)   Wt 83 kg   LMP 09/22/2017 (Exact Date)   SpO2 99%   Breastfeeding? Unknown   BMI 32.42 kg/m   Physical Exam: Gen: NAD Fundus Fundal Tone: Firm  Lochia Amount: Scant  Perineum Appearance: Intact     Data Review Labs: CBC Latest Ref Rng & Units 06/17/2018 04/08/2018 11/26/2017  WBC 4.0 - 10.5 K/uL 10.3 11.2(H) 7.7  Hemoglobin 12.0 - 15.0 g/dL 16.1 09.6 04.5  Hematocrit 36.0 - 46.0 % 35.8(L) 35.5 41.2  Platelets 150 - 400 K/uL 218 233 265   A POS  Assessment/Plan  Active Problems:   Oligohydramnios    Plan for discharge today.   Discharge Instructions: Per After Visit Summary. Activity: Advance as tolerated. Pelvic rest for 6 weeks.  Also refer to After Visit Summary Diet: Regular Medications: Allergies as of 06/20/2018   No Known Allergies     Medication List    TAKE these medications   NIFEdipine 30 MG 24 hr tablet Commonly known as:  ADALAT CC Take 1 tablet (30 mg  total) by mouth daily.   prenatal vitamin w/FE, FA 27-1 MG Tabs tablet Take 1 tablet by mouth daily at 12 noon.      Outpatient follow up:  Follow-up Information    Linzie Collin, MD Follow up in 1 week(s).   Specialty:  Obstetrics and Gynecology Contact information: [address] [place] [address] [phone]          Postpartum contraception: Will discuss at first office visit post-partum  Discharged Condition: good  Discharged to: home  Newborn Data: Disposition:home with mother  Apgars: APGAR (1 MIN): 8   APGAR (5 MINS): 9   APGAR (10 MINS):    Baby Feeding: Bottle    Elonda Husky, M.D. 06/20/2018 8:03 AM

## 2018-06-20 NOTE — Discharge Instructions (Signed)
Please call your doctor or return to the ER if you experience any chest pains, shortness of breath, headache unrelieved by pain medications, dizziness, visual changes, fever greater than 101, any heavy bleeding (saturating more than 1 pad per hour), large clots, or foul smelling discharge, any worsening abdominal pain and cramping that is not controlled by pain medication, or any signs of postpartum depression. No tampons, enemas, douches, or sexual intercourse for 6 weeks. Also avoid tub baths, hot tubs, or swimming for 6 weeks.   No strenuous activity or heavy lifting for 6 weeks.  Continue prenatal vitamin and iron.  Increase calories and fluids while breastfeeding.

## 2018-06-20 NOTE — Progress Notes (Signed)
Discharge instructions provided.  Pt and sig other verbalize understanding of all instructions and follow-up care.  Pt discharged to home with infant at 1351 on 06/20/18 via wheelchair by volunteer. Reynold Bowen, RN 06/20/2018 2:13 PM

## 2018-06-25 NOTE — Progress Notes (Deleted)
Pt presents today for postpartum BP check. Machine: Manual:

## 2018-06-30 ENCOUNTER — Ambulatory Visit (INDEPENDENT_AMBULATORY_CARE_PROVIDER_SITE_OTHER): Payer: Medicaid Other | Admitting: Obstetrics and Gynecology

## 2018-06-30 VITALS — BP 126/88 | HR 86 | Ht 64.0 in | Wt 165.5 lb

## 2018-06-30 DIAGNOSIS — Z0130 Encounter for examination of blood pressure without abnormal findings: Secondary | ICD-10-CM

## 2018-06-30 NOTE — Progress Notes (Signed)
Pt is present today for BP check postpartum. Pt stated that she is taking her medication daily. No other complaints.  BP 126/88   Pulse 86   Ht 5\' 4"  (1.626 m)   Wt 165 lb 8 oz (75.1 kg)   Breastfeeding? No   BMI 28.41 kg/m

## 2018-08-11 ENCOUNTER — Encounter: Payer: Medicaid Other | Admitting: Obstetrics and Gynecology

## 2018-12-30 IMAGING — CT CT RENAL STONE PROTOCOL
3 of 4 series · 8 of 46 positions shown, 15 images · non-contrast
Comparison: None.

CLINICAL DATA: 6 hours of low back pain on the right, radiating to
the suprapubic region. Hematuria.

EXAM:
CT ABDOMEN AND PELVIS WITHOUT CONTRAST
TECHNIQUE: Multidetector CT imaging of the abdomen and pelvis was performed
following the standard protocol without IV contrast.

[Series 4: lung bases · axial · 0.66mm/px · z∈[-776,-710]mm · 4 of 23 slices shown, 9 images]
[im 5/23  soft-tissue]
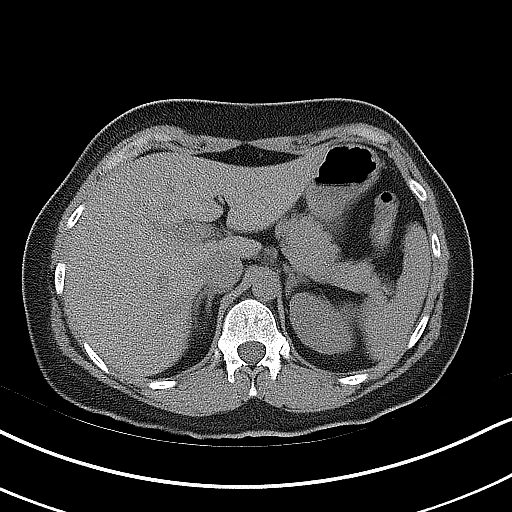
[im 5/23  lung]
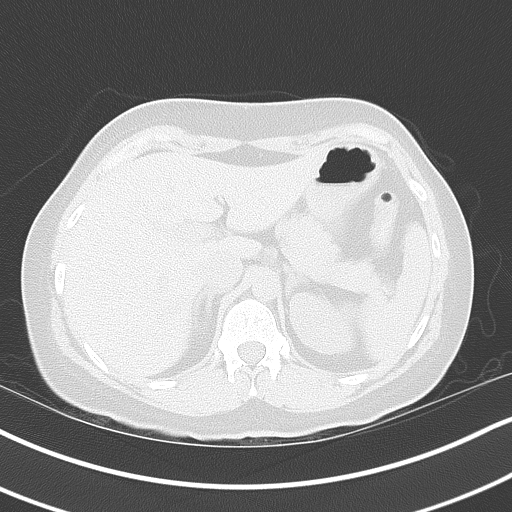
[im 5/23  bone]
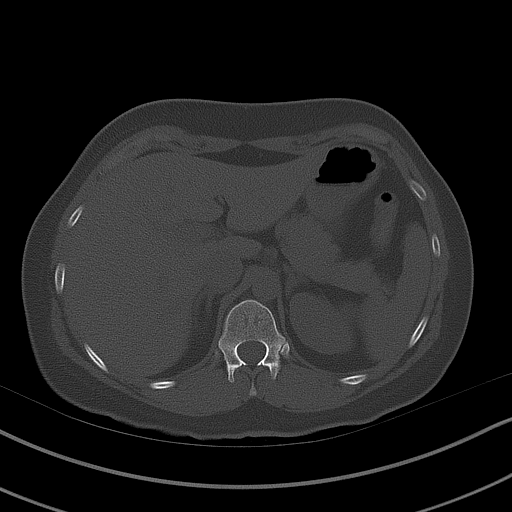
[im 9/23  soft-tissue]
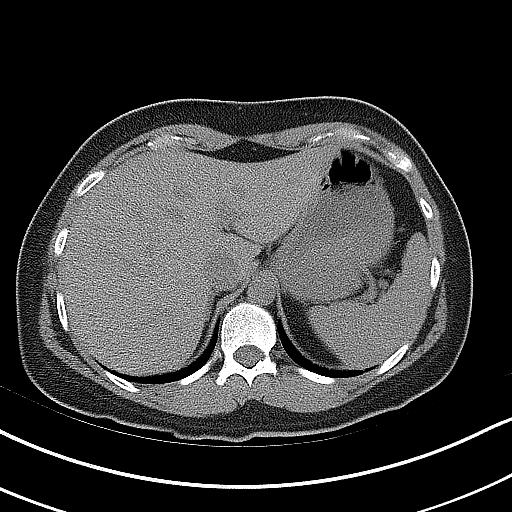
[im 9/23  lung]
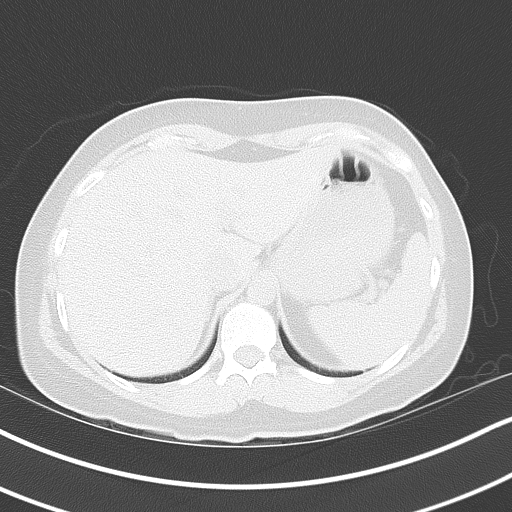
[im 14/23  soft-tissue]
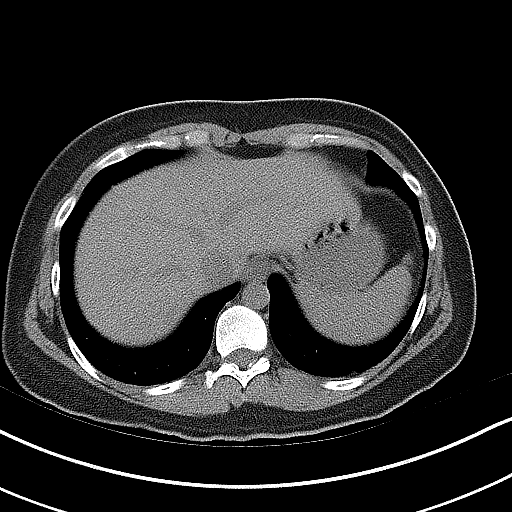
[im 14/23  lung]
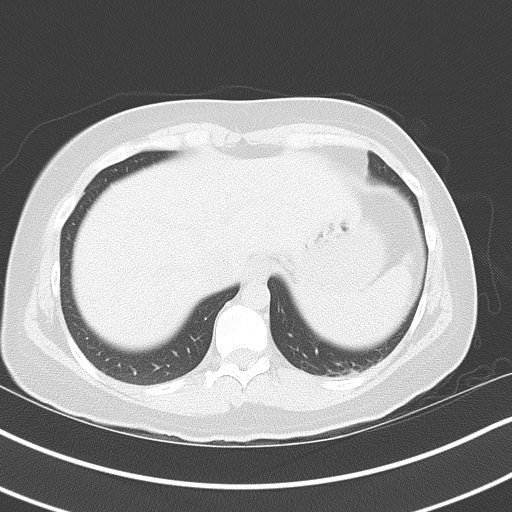
[im 18/23  soft-tissue]
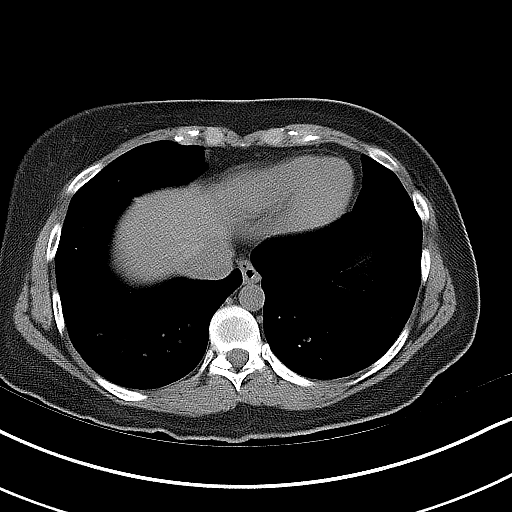
[im 18/23  lung]
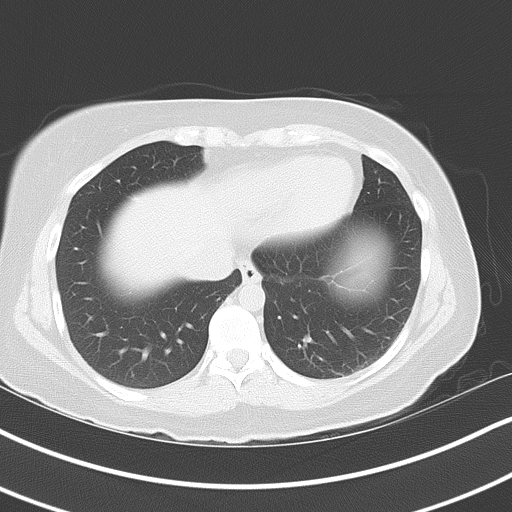

[Series 5: coronal · coronal · 0.73mm/px · 3 of 113 slices shown, 4 images]
[im 38/113  soft-tissue]
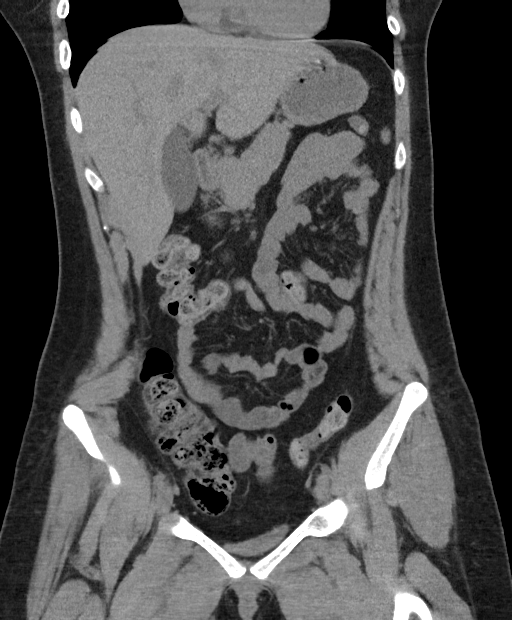
[im 50/113  soft-tissue]
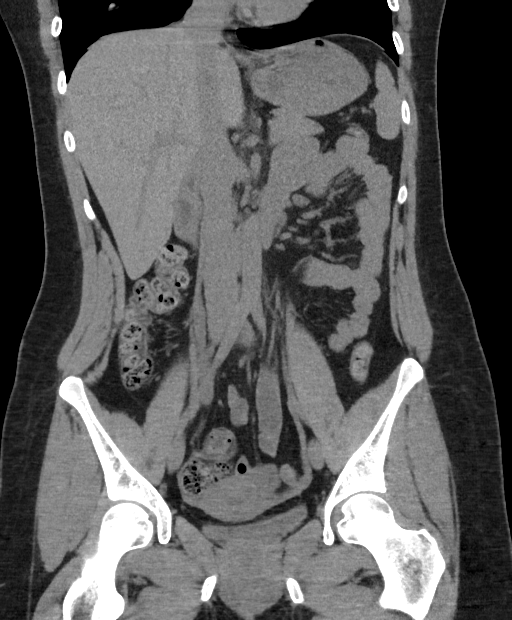
[im 50/113  bone]
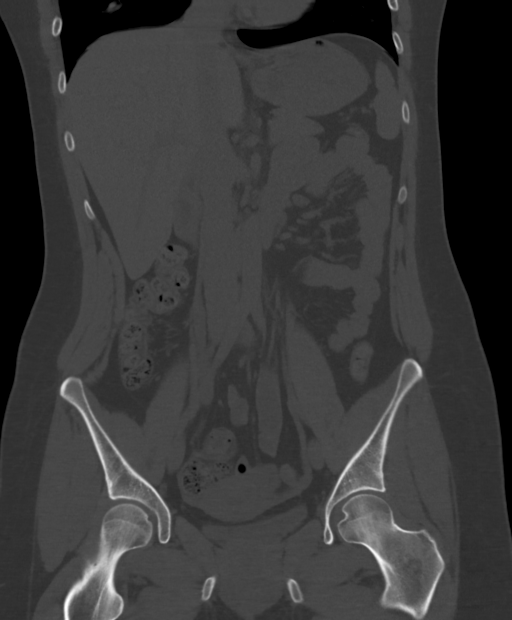
[im 63/113  soft-tissue]
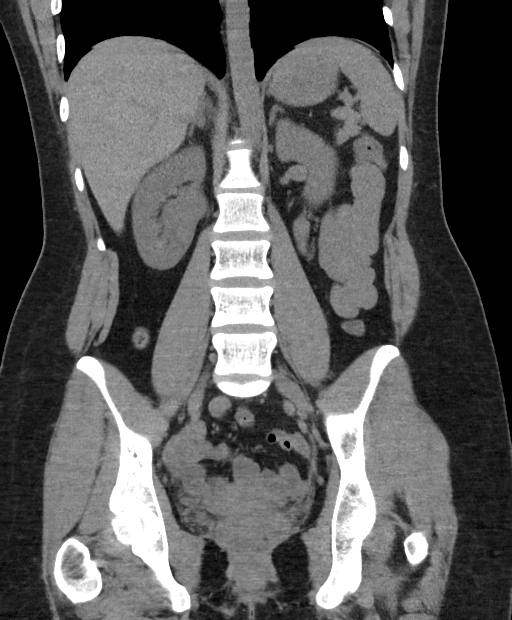

[Series 6: sagittal · sagittal · 0.44mm/px · 1 of 188 slices shown, 2 images]
[im 63/188  soft-tissue]
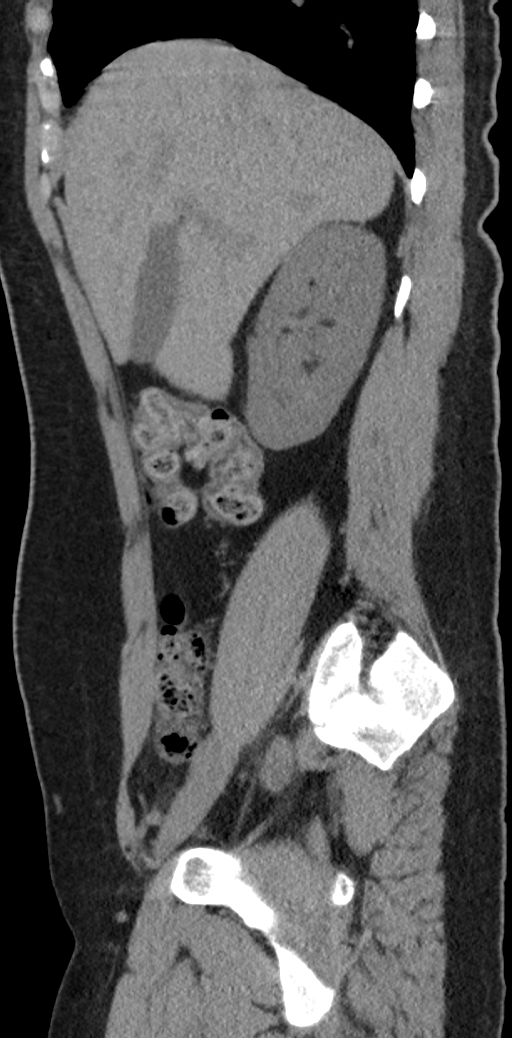
[im 63/188  bone]
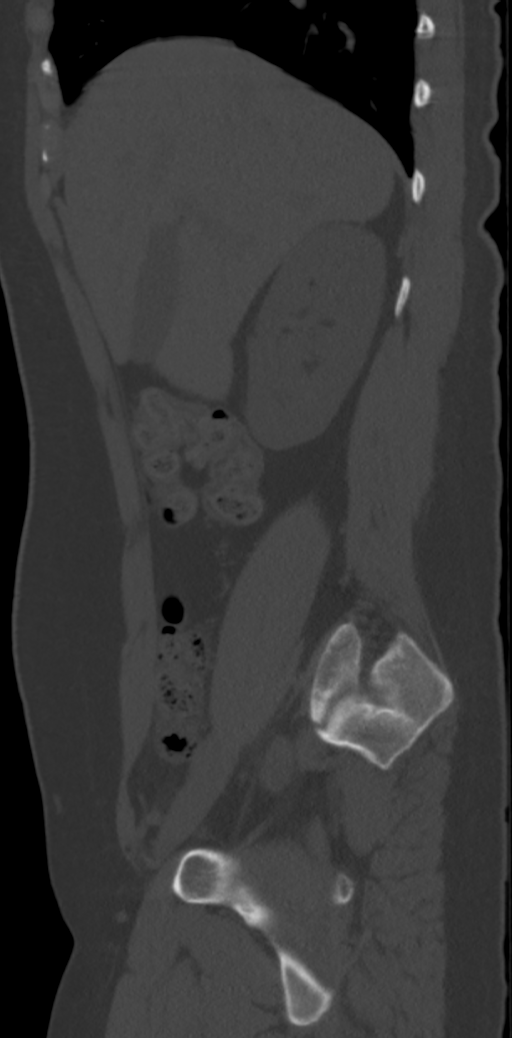

[8 of 46 positions shown; findings below may reference images not displayed]

FINDINGS: Lower chest: No acute abnormality.

Hepatobiliary: No focal liver abnormality is seen. No gallstones,
gallbladder wall thickening, or biliary dilatation.

Pancreas: Unremarkable. No pancreatic ductal dilatation or
surrounding inflammatory changes.

Spleen: Normal in size without focal abnormality.

Adrenals/Urinary Tract: Both adrenals are normal. Bilateral
nephrolithiasis measuring up to 3 mm.

Marked hydronephrosis and ureteral dilatation on the right, with a 3
x 4 mm obstructing calculus in the distal right ureter approximately
2 cm from the UVJ. Urinary bladder is unremarkable.

Stomach/Bowel: Stomach is within normal limits. Colon appears
normal. No evidence of bowel wall thickening, distention, or
inflammatory changes.

Vascular/Lymphatic: No significant vascular findings are present. No
enlarged abdominal or pelvic lymph nodes.

Reproductive: Uterus and bilateral adnexa are unremarkable.

Other: No ascites.  Small fat containing umbilical hernia.

Musculoskeletal: No significant skeletal lesion.
IMPRESSION: Obstructing 3 x 4 mm distal right ureteral calculus with marked
hydronephrosis and ureteral dilatation. Bilateral nephrolithiasis.

## 2019-05-01 ENCOUNTER — Other Ambulatory Visit: Payer: Self-pay

## 2019-05-01 ENCOUNTER — Ambulatory Visit (LOCAL_COMMUNITY_HEALTH_CENTER): Payer: Medicaid Other

## 2019-05-01 VITALS — BP 107/79 | Ht 62.5 in | Wt 173.0 lb

## 2019-05-01 DIAGNOSIS — Z30013 Encounter for initial prescription of injectable contraceptive: Secondary | ICD-10-CM

## 2019-05-01 DIAGNOSIS — Z3009 Encounter for other general counseling and advice on contraception: Secondary | ICD-10-CM | POA: Diagnosis not present

## 2019-05-01 MED ORDER — MEDROXYPROGESTERONE ACETATE 150 MG/ML IM SUSP
150.0000 mg | Freq: Once | INTRAMUSCULAR | Status: AC
  Administered 2019-05-01: 150 mg via INTRAMUSCULAR

## 2019-05-01 NOTE — Progress Notes (Signed)
Depo given per Wilburn Mylar FNP 07/31/2018 order. Tolerated well Aileen Fass, RN

## 2019-08-10 ENCOUNTER — Ambulatory Visit: Payer: Medicaid Other

## 2019-08-14 ENCOUNTER — Ambulatory Visit: Payer: Medicaid Other

## 2019-08-17 ENCOUNTER — Ambulatory Visit (LOCAL_COMMUNITY_HEALTH_CENTER): Payer: Medicaid Other

## 2019-08-17 ENCOUNTER — Other Ambulatory Visit: Payer: Self-pay

## 2019-08-17 VITALS — BP 107/74 | Ht 63.0 in | Wt 170.4 lb

## 2019-08-17 DIAGNOSIS — Z3009 Encounter for other general counseling and advice on contraception: Secondary | ICD-10-CM

## 2019-08-17 DIAGNOSIS — Z30013 Encounter for initial prescription of injectable contraceptive: Secondary | ICD-10-CM | POA: Diagnosis not present

## 2019-08-17 DIAGNOSIS — Z3042 Encounter for surveillance of injectable contraceptive: Secondary | ICD-10-CM

## 2019-08-17 MED ORDER — MEDROXYPROGESTERONE ACETATE 150 MG/ML IM SUSP
150.0000 mg | Freq: Once | INTRAMUSCULAR | Status: AC
  Administered 2019-08-17: 150 mg via INTRAMUSCULAR

## 2019-08-17 NOTE — Progress Notes (Signed)
Patient here for Depo at 15 3/7. Last PE 07/31/2018. Last Pap 03/12/2016 (NIL). One time VO for Depo given per Dr. Ernestina Patches. Patient counseled to schedule PE with next Depo due. Depo given, left deltoid, tolerated well, next Depo due reminder card given.Jenetta Downer, RN

## 2019-11-30 ENCOUNTER — Other Ambulatory Visit: Payer: Self-pay | Admitting: Physician Assistant

## 2019-11-30 DIAGNOSIS — Z3042 Encounter for surveillance of injectable contraceptive: Secondary | ICD-10-CM

## 2019-11-30 MED ORDER — MEDROXYPROGESTERONE ACETATE 150 MG/ML IM SUSP
150.0000 mg | Freq: Once | INTRAMUSCULAR | Status: AC
  Administered 2019-12-01: 150 mg via INTRAMUSCULAR

## 2019-11-30 NOTE — Progress Notes (Signed)
Per chart review, last RP 07/2018.  CBE and pap due 2020.  Provided that patient desires to continue with Depo and BP is normal, may have Depo on 12/01/2019.  Will need provider visit for pap and Depo in June.

## 2019-12-01 ENCOUNTER — Ambulatory Visit (LOCAL_COMMUNITY_HEALTH_CENTER): Payer: Medicaid Other

## 2019-12-01 ENCOUNTER — Other Ambulatory Visit: Payer: Self-pay

## 2019-12-01 VITALS — BP 115/80 | Ht 63.0 in | Wt 172.0 lb

## 2019-12-01 DIAGNOSIS — Z30013 Encounter for initial prescription of injectable contraceptive: Secondary | ICD-10-CM

## 2019-12-01 DIAGNOSIS — Z3042 Encounter for surveillance of injectable contraceptive: Secondary | ICD-10-CM

## 2019-12-01 DIAGNOSIS — Z3009 Encounter for other general counseling and advice on contraception: Secondary | ICD-10-CM

## 2019-12-01 NOTE — Progress Notes (Signed)
Depo given per [place] PA order. Tolerated well Richmond Campbell, RN

## 2020-03-02 ENCOUNTER — Ambulatory Visit: Payer: Medicaid Other

## 2020-03-04 ENCOUNTER — Other Ambulatory Visit: Payer: Self-pay

## 2020-03-04 ENCOUNTER — Encounter: Payer: Self-pay | Admitting: Physician Assistant

## 2020-03-04 ENCOUNTER — Ambulatory Visit (LOCAL_COMMUNITY_HEALTH_CENTER): Payer: Medicaid Other | Admitting: Physician Assistant

## 2020-03-04 VITALS — BP 109/72 | Ht 65.0 in | Wt 170.0 lb

## 2020-03-04 DIAGNOSIS — R87611 Atypical squamous cells cannot exclude high grade squamous intraepithelial lesion on cytologic smear of cervix (ASC-H): Secondary | ICD-10-CM | POA: Diagnosis not present

## 2020-03-04 DIAGNOSIS — Z01419 Encounter for gynecological examination (general) (routine) without abnormal findings: Secondary | ICD-10-CM

## 2020-03-04 DIAGNOSIS — Z30013 Encounter for initial prescription of injectable contraceptive: Secondary | ICD-10-CM | POA: Diagnosis not present

## 2020-03-04 DIAGNOSIS — Z3009 Encounter for other general counseling and advice on contraception: Secondary | ICD-10-CM

## 2020-03-04 DIAGNOSIS — Z3042 Encounter for surveillance of injectable contraceptive: Secondary | ICD-10-CM

## 2020-03-04 MED ORDER — MEDROXYPROGESTERONE ACETATE 150 MG/ML IM SUSP
150.0000 mg | INTRAMUSCULAR | Status: DC
  Administered 2020-03-04: 150 mg via INTRAMUSCULAR

## 2020-03-04 NOTE — Progress Notes (Signed)
Pt here for physical with Depo. Last Depo was 12/01/2019, so pt is 13 weeks and 3 days post last Depo.

## 2020-03-04 NOTE — Progress Notes (Signed)
Pt received Depo 150mg  IM today per provider order and pt tolerated. Pt declines condoms and MVI's today. Awaiting TR's. Provider orders completed.

## 2020-03-05 ENCOUNTER — Encounter: Payer: Self-pay | Admitting: Physician Assistant

## 2020-03-05 NOTE — Progress Notes (Signed)
Family Planning Visit- Repeat Yearly Visit  Subjective:  Laura Sosa is a 34 y.o. G2P2002  being seen today for an well woman visit Laura to discuss family planning options.    She is currently using Depo-Provera injections for pregnancy prevention. Patient reports she does not  want a pregnancy in the next year. Patient  has Infertility, female; History of pyelonephritis; History of kidney stones; Rubella non-immune status, antepartum; FHx: cleft palate; Laura Single umbilical artery on their problem list.  Chief Complaint  Patient presents with  . Contraception    Physical Laura Depo    Patient reports that she is here for her physical Laura to continue Depo.  States that she has had no migraines since she got a Botox injection.  Reports that her symptoms with migraine were that her right eye/vision would get blurry, she would get dizzy Laura have throbbing pain.    Patient denies any change to personal or family history.  Denies concerns today.    See flowsheet for other program required questions.   Body mass index is 28.29 kg/m. - Patient is eligible for diabetes screening based on BMI Laura age >43?  not applicable HA1C ordered? not applicable  Patient reports 1 of partners in last year. Desires STI screening?  Yes   Has patient been screened once for HCV in the past?  No  No results found for: HCVAB  Does the patient have current of drug use, have a partner with drug use, Laura/or has been incarcerated since last result? No  If yes-- Screen for HCV through [facility]   Does the patient meet criteria for HBV testing? No  Criteria:  -Household, sexual or needle sharing contact with HBV -History of drug use -HIV positive -Those with known Hep C   Health Maintenance Due  Topic Date Due  . Hepatitis C Screening  Never done  . COVID-19 Vaccine (1) Never done  . PAP SMEAR-Modifier  03/09/2019    Review of Systems  All other systems reviewed Laura are negative.   The  following portions of the patient's history were reviewed Laura updated as appropriate: allergies, current medications, past family history, past medical history, past social history, past surgical history Laura problem list. Problem list updated.  Objective:   Vitals:   03/04/20 1412  BP: 109/72  Weight: 170 lb (77.1 kg)  Height: 5\' 5"  (1.651 m)    Physical Exam Vitals Laura nursing note reviewed.  Constitutional:      General: She is not in acute distress.    Appearance: Normal appearance. She is normal weight.  HENT:     Head: Normocephalic Laura atraumatic.  Eyes:     Conjunctiva/sclera: Conjunctivae normal.  Cardiovascular:     Rate Laura Rhythm: Normal rate Laura regular rhythm.  Pulmonary:     Effort: Pulmonary effort is normal.     Breath sounds: Normal breath sounds.  Abdominal:     Palpations: Abdomen is soft. There is no mass.     Tenderness: There is no abdominal tenderness. There is no guarding or rebound.  Genitourinary:    General: Normal vulva.     Rectum: Normal.     Comments: External genitalia/pubic area without nits, lice, edema, erythema, lesions Laura inguinal adenopathy. Vagina with normal mucosa Laura discharge. Cervix without visible lesions. Uterus firm, mobile, nt, no masses, no CMT, no adnexal tenderness or fullness. Musculoskeletal:     Cervical back: Neck supple. No tenderness.  Lymphadenopathy:  Cervical: No cervical adenopathy.  Skin:    General: Skin is warm Laura dry.     Findings: No bruising, erythema or lesion.     Comments: Multiple tattoos.  Neurological:     Mental Status: She is alert Laura oriented to person, place, Laura time.  Psychiatric:        Mood Laura Affect: Mood normal.        Thought Content: Thought content normal.        Judgment: Judgment normal.       Assessment Laura Plan:  Laura R Sosa is a 34 y.o. female G2P2002 presenting to the [facility] for an yearly well woman exam/family planning  visit  Contraception counseling: Reviewed all forms of birth control options in the tiered based approach. available including abstinence; over the counter/barrier methods; hormonal contraceptive medication including pill, patch, ring, injection,contraceptive implant, ECP; hormonal Laura nonhormonal IUDs; permanent sterilization options including vasectomy Laura the various tubal sterilization modalities. Risks, benefits, Laura typical effectiveness rates were reviewed.  Questions were answered.  Written information was also given to the patient to review.  Patient desires to continue with Depo , this was prescribed for patient. She will follow up in  3 months Laura prn for surveillance.  She was told to call with any further questions, or with any concerns about this method of contraception.  Emphasized use of condoms 100% of the time for STI prevention.  Patient was not a candidate for ECP today.   1. Encounter for counseling regarding contraception Reviewed with patient when to call clinic for irregular bleeding. Rec condoms with all sex.  2. Well female exam with routine gynecological exam Reviewed with patient healthy habits to maintain normal BMI. Enc to take MVI 1 po daily. Enc to establish with/follow up with PCP for chronic conditions, illness Laura age appropriate screenings. Await test results.  Counseled that RN will notify of pap results Laura follow up plan. - IGP, Aptima HPV - Chlamydia/Gonorrhea [facility]  3. Surveillance for Depo-Provera contraception OK to continue with Depo 150 mg IM q 11-13 weeks for 1 year. - medroxyPROGESTERone (DEPO-PROVERA) injection 150 mg     Return in about 11 weeks (around 05/20/2020) for depo, annual, Laura PRN.  No future appointments.  Matt Holmes, PA

## 2020-03-09 LAB — IGP, APTIMA HPV
HPV Aptima: POSITIVE — AB
PAP Smear Comment: 0

## 2020-03-11 ENCOUNTER — Telehealth: Payer: Self-pay

## 2020-03-11 NOTE — Telephone Encounter (Signed)
Telephone call to patient today regarding her PAP results and the need for a Colpo referral.  Left message to return my call.

## 2020-03-14 NOTE — Telephone Encounter (Signed)
Spoke to patient today regarding her PAP results and the need for a Colpo referral.  PAP showed ASCUS and cannot rule out High Grade and HPV Positive.  She reports being insured and would like to go to [facility]. Will refer to [facility] today for her Colpo. Hart Carwin, RN

## 2020-03-30 ENCOUNTER — Ambulatory Visit (INDEPENDENT_AMBULATORY_CARE_PROVIDER_SITE_OTHER): Payer: Medicaid Other | Admitting: Obstetrics and Gynecology

## 2020-03-30 ENCOUNTER — Encounter: Payer: Self-pay | Admitting: Obstetrics and Gynecology

## 2020-03-30 ENCOUNTER — Other Ambulatory Visit (HOSPITAL_COMMUNITY)
Admission: RE | Admit: 2020-03-30 | Discharge: 2020-03-30 | Disposition: A | Discharge status: Home / Self Care | Payer: Medicaid Other | Source: Ambulatory Visit | Attending: Obstetrics and Gynecology | Admitting: Obstetrics and Gynecology

## 2020-03-30 VITALS — BP 114/80 | HR 92 | Ht 65.0 in | Wt 175.8 lb

## 2020-03-30 DIAGNOSIS — R87611 Atypical squamous cells cannot exclude high grade squamous intraepithelial lesion on cytologic smear of cervix (ASC-H): Secondary | ICD-10-CM | POA: Diagnosis not present

## 2020-03-30 DIAGNOSIS — N72 Inflammatory disease of cervix uteri: Secondary | ICD-10-CM | POA: Diagnosis not present

## 2020-03-30 DIAGNOSIS — B977 Papillomavirus as the cause of diseases classified elsewhere: Secondary | ICD-10-CM

## 2020-03-30 NOTE — Addendum Note (Signed)
Addended by: Dorian Pod on: 03/30/2020 02:54 PM   Modules accepted: Orders

## 2020-03-30 NOTE — Progress Notes (Signed)
HPI:  Laura Sosa is a 34 y.o.  [phone]  who presents today for evaluation and management of abnormal cervical cytology.    Dysplasia History: ASCUS-positive HPV  patient describes a history approximately 3 years ago of positive HPV but was told her Pap was "normal".  No follow-ups between current Pap and that one. Patient does not smoke tobacco  ROS:  Pertinent items noted in HPI and remainder of comprehensive ROS otherwise negative.  OB History  Gravida Para Term Preterm AB Living  2 2 2     2   SAB TAB Ectopic Multiple Live Births        0 2    # Outcome Date GA Lbr Len/2nd Weight Sex Delivery Anes PTL Lv  2 Term 06/17/18 [redacted]w[redacted]d / 00:29 6 lb 2.1 oz (2.78 kg) M Vag-Spont EPI  LIV  1 Term 2011   8 lb 6 oz (3.799 kg) F Vag-Spont   LIV    Obstetric Comments  G1 - diagnosed with cleft palate at age 26    Past Medical History:  Diagnosis Date  . Headache   . History of kidney stones   . Migraine   . Pyelonephritis 04/17/2016    Past Surgical History:  Procedure Laterality Date  . CYSTOSCOPY/RETROGRADE/URETEROSCOPY  03/15/2017   Procedure: CYSTOSCOPY/RETROGRADE/URETEROSCOPY;  Surgeon: 03/17/2017, MD;  Location: [facility] ORS;  Service: Urology;;    SOCIAL HISTORY: Social History   Substance and Sexual Activity  Alcohol Use No   Social History   Substance and Sexual Activity  Drug Use No     Family History  Problem Relation Age of Onset  . Hypertension Mother   . Diabetes Mother   . Diabetes Father   . Hypertension Father   . Emphysema Maternal Grandmother   . Lung cancer Maternal Grandmother   . Asthma Daughter   . Hypertension Paternal Grandmother   . Heart defect Half-Sister        "boot shaped heart"  . Kidney cancer Neg Hx   . Bladder Cancer Neg Hx   . Breast cancer Neg Hx   . Ovarian cancer Neg Hx   . Colon cancer Neg Hx     ALLERGIES:  Patient has no known allergies.  She has a current medication list which includes the following  prescription(s): nifedipine, and the following Facility-Administered Medications: medroxyprogesterone.  Physical Exam: -Vitals:  BP 114/80   Pulse 92   Ht 5\' 5"  (1.651 m)   Wt 175 lb 12.8 oz (79.7 kg)   BMI 29.25 kg/m   PROCEDURE: Colposcopy performed with 4% acetic acid after verbal consent obtained                           -Aceto-white Lesions Location(s): 5 and 1 o'clock.              -Biopsy performed at 5 and 1 o'clock               -ECC indicated and performed: No.     -Biopsy sites made hemostatic with pressure and Monsel's solution   -Satisfactory colposcopy: Yes.      -Evidence of Invasive cervical CA :  NO  ASSESSMENT:  Laura Sosa is a 34 y.o. Laura here for  1. Atypical squamous cells cannot exclude high grade squamous intraepithelial lesion on cytologic smear of cervix (ASC-H)   2. High risk human papilloma virus (HPV) infection of cervix   .  PLAN: 1.  I discussed the grading system of pap smears and HPV high risk viral types.  We will discuss management after colpo results return.  No orders of the defined types were placed in this encounter.          F/U  No follow-ups on file.  Brennan Bailey ,MD 03/30/2020,11:37 AM

## 2020-04-01 LAB — SURGICAL PATHOLOGY

## 2020-04-12 ENCOUNTER — Encounter: Payer: Self-pay | Admitting: Obstetrics and Gynecology

## 2020-04-12 ENCOUNTER — Other Ambulatory Visit: Payer: Self-pay

## 2020-04-12 ENCOUNTER — Ambulatory Visit (INDEPENDENT_AMBULATORY_CARE_PROVIDER_SITE_OTHER): Payer: Medicaid Other | Admitting: Obstetrics and Gynecology

## 2020-04-12 VITALS — BP 116/77 | HR 108 | Ht 65.0 in | Wt 176.2 lb

## 2020-04-12 DIAGNOSIS — D069 Carcinoma in situ of cervix, unspecified: Secondary | ICD-10-CM

## 2020-04-12 NOTE — Progress Notes (Signed)
HPI:      Laura Sosa is a 34 y.o. [phone] who LMP was No LMP recorded. Patient has had an injection.  Subjective:   She presents today to discuss her findings from colposcopy revealing CIN 2-3.    Hx: The following portions of the patient's history were reviewed and updated as appropriate:             She  has a past medical history of Headache, History of kidney stones, Migraine, and Pyelonephritis (04/17/2016). She does not have any pertinent problems on file. She  has a past surgical history that includes Cystoscopy/retrograde/ureteroscopy (03/15/2017). Her family history includes Asthma in her daughter; Diabetes in her father and mother; Emphysema in her maternal grandmother; Heart defect in her half-sister; Hypertension in her father, mother, and paternal grandmother; Lung cancer in her maternal grandmother. She  reports that she has never smoked. She has never used smokeless tobacco. She reports that she does not drink alcohol and does not use drugs. She has a current medication list which includes the following prescription(s): nifedipine, and the following Facility-Administered Medications: medroxyprogesterone. She has No Known Allergies.       Review of Systems:  Review of Systems  Constitutional: Denied constitutional symptoms, night sweats, recent illness, fatigue, fever, insomnia and weight loss.  Eyes: Denied eye symptoms, eye pain, photophobia, vision change and visual disturbance.  Ears/Nose/Throat/Neck: Denied ear, nose, throat or neck symptoms, hearing loss, nasal discharge, sinus congestion and sore throat.  Cardiovascular: Denied cardiovascular symptoms, arrhythmia, chest pain/pressure, edema, exercise intolerance, orthopnea and palpitations.  Respiratory: Denied pulmonary symptoms, asthma, pleuritic pain, productive sputum, cough, dyspnea and wheezing.  Gastrointestinal: Denied, gastro-esophageal reflux, melena, nausea and vomiting.  Genitourinary: Denied  genitourinary symptoms including symptomatic vaginal discharge, pelvic relaxation issues, and urinary complaints.  Musculoskeletal: Denied musculoskeletal symptoms, stiffness, swelling, muscle weakness and myalgia.  Dermatologic: Denied dermatology symptoms, rash and scar.  Neurologic: Denied neurology symptoms, dizziness, headache, neck pain and syncope.  Psychiatric: Denied psychiatric symptoms, anxiety and depression.  Endocrine: Denied endocrine symptoms including hot flashes and night sweats.   Meds:   Current Outpatient Medications on File Prior to Visit  Medication Sig Dispense Refill  . NIFEdipine (ADALAT CC) 30 MG 24 hr tablet Take 1 tablet (30 mg total) by mouth daily. (Patient not taking: Reported on 08/17/2019) 50 tablet 0   Current Facility-Administered Medications on File Prior to Visit  Medication Dose Route Frequency Provider Last Rate Last Admin  . medroxyPROGESTERone (DEPO-PROVERA) injection 150 mg  150 mg Intramuscular Q90 days Matt Holmes, [place]   150 mg at 03/04/20 1526    Objective:     Vitals:   04/12/20 1539  BP: 116/77  Pulse: (!) 108              CIN-3  Assessment:    [id number] Patient Active Problem List   Diagnosis Date Noted  . 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 Infertility, female 12/18/2017  . History of pyelonephritis 12/18/2017  . History of kidney stones 12/18/2017  . Rubella non-immune status, antepartum 12/18/2017  . FHx: cleft palate 12/18/2017     1. CIN III (cervical intraepithelial neoplasia grade III) with severe dysplasia     Both biopsy revealed high-grade dysplasia.   Plan:            1.  Have discussed the necessity of LEEP for CIN-2-3.  Literature given.  Questions answered. Patient is currently on Depo for birth control so timing not an issue.  Procedure of LEEP discussed. CIN-3 discussed. Orders No orders of the defined types were placed in this encounter.   No orders of the defined types were placed in this  encounter.     F/U  Return in about 2 weeks (around 04/26/2020). I spent 21 minutes involved in the care of this patient preparing to see the patient by obtaining and reviewing her medical history (including labs, imaging tests and prior procedures), documenting clinical information in the electronic health record (EHR), counseling and coordinating care plans, writing and sending prescriptions, ordering tests or procedures and directly communicating with the patient by discussing pertinent items from her history and physical exam as well as detailing my assessment and plan as noted above so that she has an informed understanding.  All of her questions were answered.  Elonda Husky, M.D. 04/12/2020 3:54 PM

## 2020-05-04 ENCOUNTER — Encounter: Payer: Medicaid Other | Admitting: Obstetrics and Gynecology

## 2020-05-05 ENCOUNTER — Encounter: Payer: Medicaid Other | Admitting: Obstetrics and Gynecology

## 2020-05-09 ENCOUNTER — Encounter: Payer: Self-pay | Admitting: Physician Assistant

## 2020-05-09 NOTE — Progress Notes (Signed)
Patient chart sent to me to review colpo results and office visit patient had after colpo with MD at [facility].  Reviewed this information and patient was counseled about needing LEEP due to CIN 2-3 on colpo but has cancelled appointments for this procedure.  [facility] will likely follow up with patient to again reschedule this procedure.

## 2020-05-11 ENCOUNTER — Encounter: Payer: Self-pay | Admitting: Obstetrics and Gynecology

## 2020-06-29 ENCOUNTER — Telehealth: Payer: Self-pay

## 2020-06-29 NOTE — Telephone Encounter (Signed)
Telephone call to [facility] to inquire about patient cancelled LEEP appointment.  Spoke to [place] and she will reach out to patient about getting rescheduled for her LEEP.  I will also send a MyChart message to patient as well today. Hart Carwin, RN

## 2020-06-29 NOTE — Telephone Encounter (Signed)
Called patient to get her LEEP rescheduled, unable to reach patient. Left message for her to call the office.

## 2020-06-29 NOTE — Telephone Encounter (Signed)
Telephone call to patient to inquire about her LEEP appointment.  Left message to please return my call. Hart Carwin, RN

## 2020-08-03 ENCOUNTER — Encounter: Payer: Medicaid Other | Admitting: Obstetrics and Gynecology

## 2020-08-15 ENCOUNTER — Encounter: Payer: Self-pay | Admitting: Obstetrics and Gynecology

## 2020-08-15 ENCOUNTER — Other Ambulatory Visit: Payer: Self-pay

## 2020-08-15 ENCOUNTER — Other Ambulatory Visit (HOSPITAL_COMMUNITY)
Admission: RE | Admit: 2020-08-15 | Discharge: 2020-08-15 | Disposition: A | Discharge status: Home / Self Care | Payer: Medicaid Other | Source: Ambulatory Visit | Attending: Obstetrics and Gynecology | Admitting: Obstetrics and Gynecology

## 2020-08-15 ENCOUNTER — Ambulatory Visit (INDEPENDENT_AMBULATORY_CARE_PROVIDER_SITE_OTHER): Payer: Medicaid Other | Admitting: Obstetrics and Gynecology

## 2020-08-15 VITALS — BP 115/77 | HR 99 | Ht 65.0 in | Wt 178.9 lb

## 2020-08-15 DIAGNOSIS — D069 Carcinoma in situ of cervix, unspecified: Secondary | ICD-10-CM | POA: Diagnosis not present

## 2020-08-15 NOTE — Progress Notes (Signed)
HPI:      Laura Sosa is a 34 y.o. [phone] who LMP was No LMP recorded. Patient has had an injection.  Subjective:   She presents today for LEEP for CIN-3.  Patient has canceled and rescheduled multiple times since her diagnosis.    Hx: The following portions of the patient's history were reviewed and updated as appropriate:             She  has a past medical history of Headache, History of kidney stones, Migraine, and Pyelonephritis (04/17/2016). She does not have any pertinent problems on file. She  has a past surgical history that includes Cystoscopy/retrograde/ureteroscopy (03/15/2017). Her family history includes Asthma in her daughter; Diabetes in her father and mother; Emphysema in her maternal grandmother; Heart defect in her half-sister; Hypertension in her father, mother, and paternal grandmother; Lung cancer in her maternal grandmother. She  reports that she has never smoked. She has never used smokeless tobacco. She reports that she does not drink alcohol and does not use drugs. She has a current medication list which includes the following prescription(s): nifedipine, and the following Facility-Administered Medications: medroxyprogesterone. She has No Known Allergies.       Review of Systems:  Review of Systems  Constitutional: Denied constitutional symptoms, night sweats, recent illness, fatigue, fever, insomnia and weight loss.  Eyes: Denied eye symptoms, eye pain, photophobia, vision change and visual disturbance.  Ears/Nose/Throat/Neck: Denied ear, nose, throat or neck symptoms, hearing loss, nasal discharge, sinus congestion and sore throat.  Cardiovascular: Denied cardiovascular symptoms, arrhythmia, chest pain/pressure, edema, exercise intolerance, orthopnea and palpitations.  Respiratory: Denied pulmonary symptoms, asthma, pleuritic pain, productive sputum, cough, dyspnea and wheezing.  Gastrointestinal: Denied, gastro-esophageal reflux, melena, nausea and  vomiting.  Genitourinary: Denied genitourinary symptoms including symptomatic vaginal discharge, pelvic relaxation issues, and urinary complaints.  Musculoskeletal: Denied musculoskeletal symptoms, stiffness, swelling, muscle weakness and myalgia.  Dermatologic: Denied dermatology symptoms, rash and scar.  Neurologic: Denied neurology symptoms, dizziness, headache, neck pain and syncope.  Psychiatric: Denied psychiatric symptoms, anxiety and depression.  Endocrine: Denied endocrine symptoms including hot flashes and night sweats.   Meds:   Current Outpatient Medications on File Prior to Visit  Medication Sig Dispense Refill  . NIFEdipine (ADALAT CC) 30 MG 24 hr tablet Take 1 tablet (30 mg total) by mouth daily. (Patient not taking: Reported on 08/17/2019) 50 tablet 0   Current Facility-Administered Medications on File Prior to Visit  Medication Dose Route Frequency Provider Last Rate Last Admin  . medroxyPROGESTERone (DEPO-PROVERA) injection 150 mg  150 mg Intramuscular Q90 days Matt Holmes, [place]   150 mg at 03/04/20 1526           Objective:     Vitals:   08/15/20 1040  BP: 115/77  Pulse: 99   Filed Weights   08/15/20 1040  Weight: 178 lb 14.4 oz (81.1 kg)              LEEP PROCEDURE NOTE  I again discussed her colpo results and explained the procedure of LEEP.  All questions were answered and she signed the consent form.  LEEP performed in the usual manner after reviewing the previous colpo findings and results. Lugol's solution was used to identify any abnormal areas of the cervix.  These were compared with the previous colpo pictures. The cervix was cleansed with betadine solution. Local injection of Lidocaine was performed for anesthesia. 50 Watts Blend current was used to remove the specimen.  It was labeled  accordingly. The base and edges of the defect was then cauterized using coagulation current. Monsel's solution was then applied in the usual  manner.     Assessment:    [id number] Patient Active Problem List   Diagnosis Date Noted  . 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 Infertility, female 12/18/2017  . History of pyelonephritis 12/18/2017  . History of kidney stones 12/18/2017  . Rubella non-immune status, antepartum 12/18/2017  . FHx: cleft palate 12/18/2017     1. High grade squamous intraepithelial lesion (HGSIL), grade 3 CIN, on biopsy of cervix        Plan:            1.  Follow-up in 1 month Orders No orders of the defined types were placed in this encounter.   No orders of the defined types were placed in this encounter.     F/U  Return in about 4 weeks (around 09/12/2020).  Elonda Husky, M.D. 08/15/2020 11:17 AM

## 2020-08-15 NOTE — Addendum Note (Signed)
Addended by: Dorian Pod on: 08/15/2020 01:18 PM   Modules accepted: Orders

## 2020-08-16 LAB — SURGICAL PATHOLOGY

## 2020-08-27 NOTE — L&D Delivery Note (Signed)
       Delivery Note   Laura Sosa is a 35 y.o. G3P2002 at [redacted]w[redacted]d Estimated Date of Delivery: 07/05/21  PRE-OPERATIVE DIAGNOSIS:  1) [redacted]w[redacted]d pregnancy.  2) Pregnancy complicated by severe hyperemesis, renal colic, pyelonephritis, lithotripsy, pleural effusion.  POST-OPERATIVE DIAGNOSIS:  1) [redacted]w[redacted]d pregnancy s/p Vaginal, Spontaneous  2) viable female infant  Delivery Type: Vaginal, Spontaneous    Delivery Anesthesia: Epidural   Labor Complications:  None    ESTIMATED BLOOD LOSS: 175 ml    FINDINGS:   1) female infant, Apgar scores of    at 1 minute and    at 5 minutes and a birthweight of   ounces.    2) Nuchal cord: Yes loose X 1  SPECIMENS:   PLACENTA:   Appearance: Intact    Removal: Spontaneous      Disposition:    DISPOSITION:  Infant to left in stable condition in the delivery room, with L&D personnel and mother,  NARRATIVE SUMMARY: Labor course:  Ms. Laura R Roll is a [id number] at [redacted]w[redacted]d who presented for labor management.  She described slow loss of fluid and irregular contractions. She received Misoprostol and underwent SROM. She progressed well in labor.  She received the appropriate anesthesia and proceeded to complete dilation. She evidenced good maternal expulsive effort during the second stage. She went on to deliver a viable infant. The placenta delivered without problems and was noted to be complete. A perineal and vaginal examination was performed. Episiotomy/Lacerations:  None   Elonda Husky, M.D. 07/06/2021 1:41 PM

## 2020-09-13 ENCOUNTER — Encounter: Payer: Self-pay | Admitting: Obstetrics and Gynecology

## 2020-09-13 ENCOUNTER — Ambulatory Visit (INDEPENDENT_AMBULATORY_CARE_PROVIDER_SITE_OTHER): Payer: Medicaid Other | Admitting: Obstetrics and Gynecology

## 2020-09-13 ENCOUNTER — Other Ambulatory Visit: Payer: Self-pay

## 2020-09-13 VITALS — BP 122/86 | HR 88 | Ht 65.0 in | Wt 176.4 lb

## 2020-09-13 DIAGNOSIS — D061 Carcinoma in situ of exocervix: Secondary | ICD-10-CM

## 2020-09-13 NOTE — Progress Notes (Signed)
HPI:      Laura Sosa is a 35 y.o. [phone] who LMP was No LMP recorded.  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  Patient reports she is doing well not have any significant discharge or bleeding at this time.  She has not resumed intercourse-she is not currently sexually active.  She is not using anything for birth control and does not desire birth control.    Hx: The following portions of the patient's history were reviewed and updated as appropriate:             She  has a past medical history of Headache, History of kidney stones, Migraine, and Pyelonephritis (04/17/2016). She does not have any pertinent problems on file. She  has a past surgical history that includes Cystoscopy/retrograde/ureteroscopy (03/15/2017). Her family history includes Asthma in her daughter; Diabetes in her father and mother; Emphysema in her maternal grandmother; Heart defect in her half-sister; Hypertension in her father, mother, and paternal grandmother; Lung cancer in her maternal grandmother. She  reports that she has never smoked. She has never used smokeless tobacco. She reports that she does not drink alcohol and does not use drugs. She has a current medication list which includes the following prescription(s): nifedipine, and the following Facility-Administered Medications: medroxyprogesterone. She has No Known Allergies.       Review of Systems:  Review of Systems  Constitutional: Denied constitutional symptoms, night sweats, recent illness, fatigue, fever, insomnia and weight loss.  Eyes: Denied eye symptoms, eye pain, photophobia, vision change and visual disturbance.  Ears/Nose/Throat/Neck: Denied ear, nose, throat or neck symptoms, hearing loss, nasal discharge, sinus congestion and sore throat.  Cardiovascular: Denied cardiovascular symptoms, arrhythmia, chest pain/pressure, edema, exercise intolerance, orthopnea and palpitations.  Respiratory: Denied pulmonary  symptoms, asthma, pleuritic pain, productive sputum, cough, dyspnea and wheezing.  Gastrointestinal: Denied, gastro-esophageal reflux, melena, nausea and vomiting.  Genitourinary: Denied genitourinary symptoms including symptomatic vaginal discharge, pelvic relaxation issues, and urinary complaints.  Musculoskeletal: Denied musculoskeletal symptoms, stiffness, swelling, muscle weakness and myalgia.  Dermatologic: Denied dermatology symptoms, rash and scar.  Neurologic: Denied neurology symptoms, dizziness, headache, neck pain and syncope.  Psychiatric: Denied psychiatric symptoms, anxiety and depression.  Endocrine: Denied endocrine symptoms including hot flashes and night sweats.   Meds:   Current Outpatient Medications on File Prior to Visit  Medication Sig Dispense Refill  . NIFEdipine (ADALAT CC) 30 MG 24 hr tablet Take 1 tablet (30 mg total) by mouth daily. (Patient not taking: Reported on 08/17/2019) 50 tablet 0   Current Facility-Administered Medications on File Prior to Visit  Medication Dose Route Frequency Provider Last Rate Last Admin  . medroxyPROGESTERone (DEPO-PROVERA) injection 150 mg  150 mg Intramuscular Q90 days Matt Holmes, [place]   150 mg at 03/04/20 1526       The pregnancy intention screening data noted above was reviewed. Potential methods of contraception were discussed. The patient elected to proceed with Abstinence.     Objective:     Vitals:   09/13/20 1402  BP: 122/86  Pulse: 88   Filed Weights   09/13/20 1402  Weight: 176 lb 6.4 oz (80 kg)              Pathology results reviewed in detail with the patient  Assessment:    G2P2002 Patient Active Problem List   Diagnosis Date Noted  . 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 Infertility, female 12/18/2017  . History of pyelonephritis 12/18/2017  .  History of kidney stones 12/18/2017  . Rubella non-immune status, antepartum 12/18/2017  . FHx: cleft palate 12/18/2017     1. Carcinoma in situ  of exocervix     We discussed the pathology results and reviewed the margins.   Plan:            1.  Recommend follow-up Pap Co test in 6 months.  2. patient may resume intercourse and all normal activities. Orders No orders of the defined types were placed in this encounter.   No orders of the defined types were placed in this encounter.     F/U  Return in about 5 months (around 02/11/2021). I spent 21 minutes involved in the care of this patient preparing to see the patient by obtaining and reviewing her medical history (including labs, imaging tests and prior procedures), documenting clinical information in the electronic health record (EHR), counseling and coordinating care plans, writing and sending prescriptions, ordering tests or procedures and directly communicating with the patient by discussing pertinent items from her history and physical exam as well as detailing my assessment and plan as noted above so that she has an informed understanding.  All of her questions were answered.  Elonda Husky, M.D. 09/13/2020 2:21 PM

## 2020-11-10 ENCOUNTER — Ambulatory Visit (INDEPENDENT_AMBULATORY_CARE_PROVIDER_SITE_OTHER): Payer: Medicaid Other | Admitting: Obstetrics and Gynecology

## 2020-11-10 ENCOUNTER — Encounter: Payer: Self-pay | Admitting: Obstetrics and Gynecology

## 2020-11-10 ENCOUNTER — Other Ambulatory Visit: Payer: Self-pay

## 2020-11-10 DIAGNOSIS — N910 Primary amenorrhea: Secondary | ICD-10-CM | POA: Diagnosis not present

## 2020-11-10 DIAGNOSIS — D069 Carcinoma in situ of cervix, unspecified: Secondary | ICD-10-CM | POA: Diagnosis not present

## 2020-11-10 DIAGNOSIS — Z9889 Other specified postprocedural states: Secondary | ICD-10-CM | POA: Diagnosis not present

## 2020-11-10 DIAGNOSIS — Z3201 Encounter for pregnancy test, result positive: Secondary | ICD-10-CM

## 2020-11-10 DIAGNOSIS — N912 Amenorrhea, unspecified: Secondary | ICD-10-CM

## 2020-11-10 DIAGNOSIS — Z3200 Encounter for pregnancy test, result unknown: Secondary | ICD-10-CM

## 2020-11-10 LAB — POCT URINE PREGNANCY: Preg Test, Ur: POSITIVE — AB

## 2020-11-10 NOTE — Progress Notes (Signed)
HPI:      Laura Sosa is a 35 y.o. [phone] who LMP was No LMP recorded.  Subjective:   She presents today because she has missed a menstrual period.  She states her periods have been very irregular and she is not exactly sure of her last menstrual period dating.  She has experienced some nausea but no significant vomiting.  She has had nausea and vomiting for the entire pregnancy H previous time she has been pregnant.  Based on LMP she has approximately 7 weeks estimated gestational age.  Of significant note patient has a history of CIN-3 status post LEEP 3 months ago. She is taking prenatal vitamins.     Hx: The following portions of the patient's history were reviewed and updated as appropriate:             She  has a past medical history of Headache, History of kidney stones, Migraine, and Pyelonephritis (04/17/2016). She does not have any pertinent problems on file. She  has a past surgical history that includes Cystoscopy/retrograde/ureteroscopy (03/15/2017). Her family history includes Asthma in her daughter; Diabetes in her father and mother; Emphysema in her maternal grandmother; Heart defect in her half-sister; Hypertension in her father, mother, and paternal grandmother; Lung cancer in her maternal grandmother. She  reports that she has never smoked. She has never used smokeless tobacco. She reports that she does not drink alcohol and does not use drugs. She has a current medication list which includes the following prescription(s): multivitamin-prenatal. She has No Known Allergies.       Review of Systems:  Review of Systems  Constitutional: Denied constitutional symptoms, night sweats, recent illness, fatigue, fever, insomnia and weight loss.  Eyes: Denied eye symptoms, eye pain, photophobia, vision change and visual disturbance.  Ears/Nose/Throat/Neck: Denied ear, nose, throat or neck symptoms, hearing loss, nasal discharge, sinus congestion and sore throat.   Cardiovascular: Denied cardiovascular symptoms, arrhythmia, chest pain/pressure, edema, exercise intolerance, orthopnea and palpitations.  Respiratory: Denied pulmonary symptoms, asthma, pleuritic pain, productive sputum, cough, dyspnea and wheezing.  Gastrointestinal: Denied, gastro-esophageal reflux, melena, nausea and vomiting.  Genitourinary: Denied genitourinary symptoms including symptomatic vaginal discharge, pelvic relaxation issues, and urinary complaints.  Musculoskeletal: Denied musculoskeletal symptoms, stiffness, swelling, muscle weakness and myalgia.  Dermatologic: Denied dermatology symptoms, rash and scar.  Neurologic: Denied neurology symptoms, dizziness, headache, neck pain and syncope.  Psychiatric: Denied psychiatric symptoms, anxiety and depression.  Endocrine: Denied endocrine symptoms including hot flashes and night sweats.   Meds:   Current Outpatient Medications on File Prior to Visit  Medication Sig Dispense Refill  . Prenatal Vit-Fe Fumarate-FA (MULTIVITAMIN-PRENATAL) 27-0.8 MG TABS tablet Take 1 tablet by mouth daily at 12 noon.     No current facility-administered medications on file prior to visit.          Objective:     There were no vitals filed for this visit. There were no vitals filed for this visit.            Urinary pregnancy test positive  Assessment:    [id number] Patient Active Problem List   Diagnosis Date Noted  . 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 Infertility, female 12/18/2017  . History of pyelonephritis 12/18/2017  . History of kidney stones 12/18/2017  . Rubella non-immune status, antepartum 12/18/2017  . FHx: cleft palate 12/18/2017     1. Amenorrhea   2. Possible pregnancy, not confirmed   3. High grade squamous intraepithelial lesion (HGSIL), grade 3 CIN, on  biopsy of cervix   4. History of loop electrical excision procedure (LEEP)        Plan:            Prenatal Plan 1.  The patient was given prenatal  literature. 2.  She was continued on prenatal vitamins. 3.  A prenatal lab panel to be drawn at nurse visit. 4.  An ultrasound was ordered to better determine an EDC. 5.  A nurse visit was scheduled. 6.  Genetic testing and testing for other inheritable conditions discussed in detail. She will decide in the future whether to have these labs performed. 7.  A general overview of pregnancy testing, visit schedule, ultrasound schedule, and prenatal care was discussed. 8.  COVID and its risks associated with pregnancy, prevention by limiting exposure and use of masks, as well as the risks and benefits of vaccination during pregnancy were discussed in detail.  [facility] policy regarding office and hospital visitation and testing was explained. 9.  Benefits of breast-feeding discussed in detail including both maternal and infant benefits. Ready Set Baby website discussed. 10.  Plan first follow-up Pap after LEEP with new OB visit. 11.  Literature on nausea vomiting of pregnancy given   Orders Orders Placed This Encounter  Procedures  . US OB Comp Less 14 Wks  . POCT urine pregnancy    No orders of the defined types were placed in this encounter.     F/U  Return in about 6 weeks (around 12/22/2020). I spent 33 minutes involved in the care of this patient preparing to see the patient by obtaining and reviewing her medical history (including labs, imaging tests and prior procedures), documenting clinical information in the electronic health record (EHR), counseling and coordinating care plans, writing and sending prescriptions, ordering tests or procedures and directly communicating with the patient by discussing pertinent items from her history and physical exam as well as detailing my assessment and plan as noted above so that she has an informed understanding.  All of her questions were answered.  Elonda Husky, M.D. 11/10/2020 2:52 PM

## 2020-11-13 ENCOUNTER — Other Ambulatory Visit: Payer: Self-pay

## 2020-11-13 ENCOUNTER — Emergency Department
Admission: EM | Admit: 2020-11-13 | Discharge: 2020-11-13 | Disposition: A | Discharge status: Home / Self Care | Payer: Medicaid Other | Attending: Emergency Medicine | Admitting: Emergency Medicine

## 2020-11-13 ENCOUNTER — Encounter: Payer: Self-pay | Admitting: Emergency Medicine

## 2020-11-13 DIAGNOSIS — Z8541 Personal history of malignant neoplasm of cervix uteri: Secondary | ICD-10-CM | POA: Insufficient documentation

## 2020-11-13 DIAGNOSIS — O219 Vomiting of pregnancy, unspecified: Secondary | ICD-10-CM | POA: Diagnosis present

## 2020-11-13 DIAGNOSIS — O2311 Infections of bladder in pregnancy, first trimester: Secondary | ICD-10-CM | POA: Insufficient documentation

## 2020-11-13 DIAGNOSIS — E860 Dehydration: Secondary | ICD-10-CM

## 2020-11-13 DIAGNOSIS — Z3A08 8 weeks gestation of pregnancy: Secondary | ICD-10-CM | POA: Insufficient documentation

## 2020-11-13 DIAGNOSIS — N3000 Acute cystitis without hematuria: Secondary | ICD-10-CM

## 2020-11-13 DIAGNOSIS — O211 Hyperemesis gravidarum with metabolic disturbance: Secondary | ICD-10-CM | POA: Diagnosis not present

## 2020-11-13 DIAGNOSIS — Z3A01 Less than 8 weeks gestation of pregnancy: Secondary | ICD-10-CM | POA: Diagnosis not present

## 2020-11-13 LAB — URINALYSIS, COMPLETE (UACMP) WITH MICROSCOPIC
Bilirubin Urine: NEGATIVE
Glucose, UA: NEGATIVE mg/dL
Ketones, ur: 20 mg/dL — AB
Nitrite: POSITIVE — AB
Protein, ur: 30 mg/dL — AB
Specific Gravity, Urine: 1.026 (ref 1.005–1.030)
pH: 5 (ref 5.0–8.0)

## 2020-11-13 LAB — COMPREHENSIVE METABOLIC PANEL
ALT: 30 U/L (ref 0–44)
AST: 21 U/L (ref 15–41)
Albumin: 4.2 g/dL (ref 3.5–5.0)
Alkaline Phosphatase: 79 U/L (ref 38–126)
Anion gap: 7 (ref 5–15)
BUN: 12 mg/dL (ref 6–20)
CO2: 21 mmol/L — ABNORMAL LOW (ref 22–32)
Calcium: 9.2 mg/dL (ref 8.9–10.3)
Chloride: 104 mmol/L (ref 98–111)
Creatinine, Ser: 0.7 mg/dL (ref 0.44–1.00)
GFR, Estimated: >60 mL/min (ref >60)
Glucose, Bld: 88 mg/dL (ref 70–99)
Potassium: 4 mmol/L (ref 3.5–5.1)
Sodium: 132 mmol/L — ABNORMAL LOW (ref 135–145)
Total Bilirubin: 1 mg/dL (ref 0.3–1.2)
Total Protein: 7.8 g/dL (ref 6.5–8.1)

## 2020-11-13 LAB — CBC
HCT: 44.2 % (ref 36.0–46.0)
Hemoglobin: 15.1 g/dL — ABNORMAL HIGH (ref 12.0–15.0)
MCH: 29 pg (ref 26.0–34.0)
MCHC: 34.2 g/dL (ref 30.0–36.0)
MCV: 84.8 fL (ref 80.0–100.0)
Platelets: 320 10*3/uL (ref 150–400)
RBC: 5.21 MIL/uL — ABNORMAL HIGH (ref 3.87–5.11)
RDW: 12.2 % (ref 11.5–15.5)
WBC: 10.3 10*3/uL (ref 4.0–10.5)
nRBC: 0 % (ref 0.0–0.2)

## 2020-11-13 LAB — HCG, QUANTITATIVE, PREGNANCY: hCG, Beta Chain, Quant, S: 86798 m[IU]/mL — ABNORMAL HIGH (ref <5)

## 2020-11-13 LAB — LIPASE, BLOOD: Lipase: 27 U/L (ref 11–51)

## 2020-11-13 MED ORDER — PYRIDOXINE HCL 100 MG/ML IJ SOLN
100.0000 mg | Freq: Once | INTRAMUSCULAR | Status: AC
  Administered 2020-11-13: 100 mg via INTRAVENOUS
  Filled 2020-11-13 (×2): qty 1

## 2020-11-13 MED ORDER — LACTATED RINGERS IV BOLUS
1000.0000 mL | Freq: Once | INTRAVENOUS | Status: AC
  Administered 2020-11-13: 1000 mL via INTRAVENOUS

## 2020-11-13 MED ORDER — CEPHALEXIN 500 MG PO CAPS
500.0000 mg | ORAL_CAPSULE | Freq: Four times a day (QID) | ORAL | 0 refills | Status: AC
Start: 2020-11-13 — End: 2020-11-23

## 2020-11-13 MED ORDER — DOXYLAMINE SUCCINATE (SLEEP) 25 MG PO TABS
25.0000 mg | ORAL_TABLET | ORAL | Status: AC
  Administered 2020-11-13: 25 mg via ORAL
  Filled 2020-11-13 (×3): qty 1

## 2020-11-13 MED ORDER — DOXYLAMINE-PYRIDOXINE 10-10 MG PO TBEC: 1.0000 | DELAYED_RELEASE_TABLET | Freq: Two times a day (BID) | ORAL | 0 refills | Status: AC | PRN

## 2020-11-13 NOTE — ED Provider Notes (Signed)
[facility] Emergency Department Provider Note  ____________________________________________   Event Date/Time   First MD Initiated Contact with Patient 11/13/20 1537     (approximate)  I have reviewed the triage vital signs and the nursing notes.   HISTORY  Chief Complaint Emesis During Pregnancy   HPI Laura Sosa is a 35 y.o. female G3P2 with a PMH of kidney stones, migraine headaches, pyelonephritis and reportedly local cervical cancer removed about 3 months ago approximately [redacted] weeks pregnant by last LMP who presents for assessment of 3 4 days of some nonbloody nonbilious emesis and decreased appetite.  Patient states she has had hyperemesis gravidarum with her pregnancies and states this feels very similar.  She denies any other symptoms including headache, earache, sore throat, fevers, chills, cough, shortness of breath, chest pain, back pain, abdominal pain, vaginal bleeding, vaginal discharge, burning with urination, rash, extremity pain or any other acute sick symptoms.  Has not been taking anything for her symptoms as she states she is afraid to take Zofran as previous child had a cleft lip and she is not sure if this is related to that.  Denies EtOH use illicit drug use, tobacco abuse or trauma.         Past Medical History:  Diagnosis Date  . Headache   . History of kidney stones   . Migraine   . Pyelonephritis 04/17/2016    Patient Active Problem List   Diagnosis Date Noted  . 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 Infertility, female 12/18/2017  . History of pyelonephritis 12/18/2017  . History of kidney stones 12/18/2017  . Rubella non-immune status, antepartum 12/18/2017  . FHx: cleft palate 12/18/2017    Past Surgical History:  Procedure Laterality Date  . CYSTOSCOPY/RETROGRADE/URETEROSCOPY  03/15/2017   Procedure: CYSTOSCOPY/RETROGRADE/URETEROSCOPY;  Surgeon: Hildred Laser, MD;  Location: [facility] ORS;  Service: Urology;;     Prior to Admission medications   Medication Sig Start Date End Date Taking? Authorizing Provider  cephALEXin (KEFLEX) 500 MG capsule Take 1 capsule (500 mg total) by mouth 4 (four) times daily for 10 days. 11/13/20 11/23/20 Yes Gilles Chiquito, MD  Doxylamine-Pyridoxine 10-10 MG TBEC Take 1 tablet by mouth 2 (two) times daily as needed for up to 7 days. 11/13/20 11/20/20 Yes Gilles Chiquito, MD  Prenatal Vit-Fe Fumarate-FA (MULTIVITAMIN-PRENATAL) 27-0.8 MG TABS tablet Take 1 tablet by mouth daily at 12 noon.    [provider]    Allergies Patient has no known allergies.  Family History  Problem Relation Age of Onset  . Hypertension Mother   . Diabetes Mother   . Diabetes Father   . Hypertension Father   . Emphysema Maternal Grandmother   . Lung cancer Maternal Grandmother   . Asthma Daughter   . Hypertension Paternal Grandmother   . Heart defect Half-Sister        "boot shaped heart"  . Kidney cancer Neg Hx   . Bladder Cancer Neg Hx   . Breast cancer Neg Hx   . Ovarian cancer Neg Hx   . Colon cancer Neg Hx     Social History Social History   Tobacco Use  . Smoking status: Never Smoker  . Smokeless tobacco: Never Used  Vaping Use  . Vaping Use: Never used  Substance Use Topics  . Alcohol use: No  . Drug use: No    Review of Systems  Review of Systems  Constitutional: Positive for malaise/fatigue. Negative for chills and fever.  HENT: Negative for sore throat.   Eyes: Negative for pain.  Respiratory: Negative for cough and stridor.   Cardiovascular: Negative for chest pain.  Gastrointestinal: Positive for nausea and vomiting.  Genitourinary: Negative for dysuria.  Musculoskeletal: Negative for myalgias.  Skin: Negative for rash.  Neurological: Negative for seizures, loss of consciousness and headaches.  Psychiatric/Behavioral: Negative for suicidal ideas.  All other systems reviewed and are negative.      ____________________________________________   PHYSICAL EXAM:  VITAL SIGNS: ED Triage Vitals  Enc Vitals Group     BP 11/13/20 1421 111/85     Pulse Rate 11/13/20 1421 86     Resp 11/13/20 1421 16     Temp 11/13/20 1421 97.6 F (36.4 C)     Temp Source 11/13/20 1421 Oral     SpO2 11/13/20 1421 96 %     Weight 11/13/20 1422 172 lb (78 kg)     Height 11/13/20 1422 5\' 3"  (1.6 m)     Head Circumference --      Peak Flow --      Pain Score 11/13/20 1425 0     Pain Loc --      Pain Edu? --      Excl. in GC? --    Vitals:   11/13/20 1421 11/13/20 1704  BP: 111/85   Pulse: 86 88  Resp: 16 17  Temp: 97.6 F (36.4 C)   SpO2: 96%    Physical Exam Vitals and nursing note reviewed.  Constitutional:      General: She is not in acute distress.    Appearance: She is well-developed.  HENT:     Head: Normocephalic and atraumatic.     Right Ear: External ear normal.     Left Ear: External ear normal.     Nose: Nose normal.     Mouth/Throat:     Mouth: Mucous membranes are dry.  Eyes:     Conjunctiva/sclera: Conjunctivae normal.  Cardiovascular:     Rate and Rhythm: Normal rate and regular rhythm.     Heart sounds: No murmur heard.   Pulmonary:     Effort: Pulmonary effort is normal. No respiratory distress.     Breath sounds: Normal breath sounds.  Abdominal:     Palpations: Abdomen is soft.     Tenderness: There is no abdominal tenderness.  Musculoskeletal:     Cervical back: Neck supple.  Skin:    General: Skin is warm and dry.     Capillary Refill: Capillary refill takes 2 to 3 seconds.  Neurological:     Mental Status: She is alert and oriented to person, place, and time.  Psychiatric:        Mood and Affect: Mood normal.      ____________________________________________   LABS (all labs ordered are listed, but only abnormal results are displayed)  Labs Reviewed  COMPREHENSIVE METABOLIC PANEL - Abnormal; Notable for the following components:       Result Value   Sodium 132 (*)    CO2 21 (*)    All other components within normal limits  CBC - Abnormal; Notable for the following components:   RBC 5.21 (*)    Hemoglobin 15.1 (*)    All other components within normal limits  URINALYSIS, COMPLETE (UACMP) WITH MICROSCOPIC - Abnormal; Notable for the following components:   Color, Urine AMBER (*)    APPearance CLOUDY (*)    Hgb urine dipstick SMALL (*)    Ketones, ur 20 (*)  Protein, ur 30 (*)    Nitrite POSITIVE (*)    Leukocytes,Ua MODERATE (*)    Bacteria, UA MANY (*)    All other components within normal limits  HCG, QUANTITATIVE, PREGNANCY - Abnormal; Notable for the following components:   hCG, Beta Chain, Quant, [place] 50,932 (*)    All other components within normal limits  URINE CULTURE  LIPASE, BLOOD   ____________________________________________  EKG  ____________________________________________  RADIOLOGY  ED MD interpretation:   Official radiology report(s): No results found.  ____________________________________________   PROCEDURES  Procedure(s) performed (including Critical Care):  Procedures   ____________________________________________   INITIAL IMPRESSION / ASSESSMENT AND PLAN / ED COURSE      Patient presents with above to history and exam approximately [redacted] weeks pregnant by last LMP for assessment of some nonbloody nonbilious emesis.  She denies any pain diarrhea or other sick symptoms.  She is afebrile and hemodynamically stable on arrival.  She does appear dehydrated although remainder of exam is unremarkable with abdomen soft nontender throughout and no CVA tenderness.  Suspect patient may have some hyperemesis associate with her pregnancy.  She also appears dehydrated which I suspect is secondary to some GI losses.  No history of recent trauma.  Also the possible she have a mild infectious gastritis with a low suspicion for sepsis bacteremia or other serious invasive bacterial infection at  this time.  Lipase is not consistent with pancreatitis.  CMP shows no significant electrolyte or metabolic derangements.  No evidence of hepatitis or cholestasis and given reassuring abdominal exam I have a low suspicion for cholecystitis.  CBC shows no leukocytosis or acute anemia.  Given absence of fever or CVA tenderness Evalose patient for pyelonephritis.  UA remarkable for protein, nitrites and LE S with 21-50 WBCs and many bacteria.  This is concerning for UTI electronically patient has sepsis or other complicating factors at this time.  Believe she is appropriate for outpatient antibiotics.  Rx written for Keflex.  Patient treated with IV fluids, B6 and doxylamine.  Given improvement in symptoms patient tolerating p.o. after this treatment believe she is safe for discharge with plan for outpatient OB follow-up.  Discharged stable condition.  Return cautions advised and discussed.  ____________________________________________   FINAL CLINICAL IMPRESSION(S) / ED DIAGNOSES  Final diagnoses:  Hyperemesis gravidarum with dehydration  Acute cystitis without hematuria    Medications  lactated ringers bolus 1,000 mL (0 mLs Intravenous Stopped 11/13/20 1704)  pyridOXINE (B-6) injection 100 mg (100 mg Intravenous Given 11/13/20 1702)  doxylamine (Sleep) (UNISOM) tablet 25 mg (25 mg Oral Given 11/13/20 1702)     ED Discharge Orders         Ordered    Doxylamine-Pyridoxine 10-10 MG TBEC  2 times daily PRN        11/13/20 1707    cephALEXin (KEFLEX) 500 MG capsule  4 times daily        11/13/20 1707           Note:  This document was prepared using Dragon voice recognition software and may include unintentional dictation errors.   Gilles Chiquito, MD 11/13/20 [phone]

## 2020-11-13 NOTE — ED Triage Notes (Signed)
Pt states that she is [redacted] weeks pregnant and has not been eating much- pt states she has been trying to eat but will vomit everything back up- pt states this is her 3rd pregnancy and that this has happened with all 3- pt states she also had cervical cancer removed 3 months ago

## 2020-11-14 ENCOUNTER — Other Ambulatory Visit: Payer: Self-pay | Admitting: Surgical

## 2020-11-14 MED ORDER — PROMETHAZINE HCL 25 MG PO TABS: 25.0000 mg | ORAL_TABLET | Freq: Four times a day (QID) | ORAL | 0 refills | Status: DC | PRN

## 2020-11-16 LAB — URINE CULTURE: Culture: >=100000 — AB

## 2020-11-17 ENCOUNTER — Other Ambulatory Visit: Payer: Self-pay

## 2020-11-17 ENCOUNTER — Emergency Department
Admission: EM | Admit: 2020-11-17 | Discharge: 2020-11-17 | Disposition: A | Discharge status: Home / Self Care | Payer: Medicaid Other | Attending: Emergency Medicine | Admitting: Emergency Medicine

## 2020-11-17 DIAGNOSIS — Z3A09 9 weeks gestation of pregnancy: Secondary | ICD-10-CM | POA: Insufficient documentation

## 2020-11-17 DIAGNOSIS — O219 Vomiting of pregnancy, unspecified: Secondary | ICD-10-CM | POA: Diagnosis not present

## 2020-11-17 LAB — URINALYSIS, COMPLETE (UACMP) WITH MICROSCOPIC
Bilirubin Urine: NEGATIVE
Glucose, UA: NEGATIVE mg/dL
Ketones, ur: 80 mg/dL — AB
Nitrite: POSITIVE — AB
Protein, ur: 30 mg/dL — AB
Specific Gravity, Urine: 1.027 (ref 1.005–1.030)
pH: 5 (ref 5.0–8.0)

## 2020-11-17 LAB — COMPREHENSIVE METABOLIC PANEL
ALT: 33 U/L (ref 0–44)
AST: 17 U/L (ref 15–41)
Albumin: 4.4 g/dL (ref 3.5–5.0)
Alkaline Phosphatase: 81 U/L (ref 38–126)
Anion gap: 9 (ref 5–15)
BUN: 12 mg/dL (ref 6–20)
CO2: 22 mmol/L (ref 22–32)
Calcium: 9.4 mg/dL (ref 8.9–10.3)
Chloride: 103 mmol/L (ref 98–111)
Creatinine, Ser: 0.62 mg/dL (ref 0.44–1.00)
GFR, Estimated: >60 mL/min (ref >60)
Glucose, Bld: 84 mg/dL (ref 70–99)
Potassium: 4 mmol/L (ref 3.5–5.1)
Sodium: 134 mmol/L — ABNORMAL LOW (ref 135–145)
Total Bilirubin: 1 mg/dL (ref 0.3–1.2)
Total Protein: 8 g/dL (ref 6.5–8.1)

## 2020-11-17 LAB — CBC
HCT: 43.5 % (ref 36.0–46.0)
Hemoglobin: 14.9 g/dL (ref 12.0–15.0)
MCH: 28.9 pg (ref 26.0–34.0)
MCHC: 34.3 g/dL (ref 30.0–36.0)
MCV: 84.5 fL (ref 80.0–100.0)
Platelets: 319 10*3/uL (ref 150–400)
RBC: 5.15 MIL/uL — ABNORMAL HIGH (ref 3.87–5.11)
RDW: 12.4 % (ref 11.5–15.5)
WBC: 11.6 10*3/uL — ABNORMAL HIGH (ref 4.0–10.5)
nRBC: 0 % (ref 0.0–0.2)

## 2020-11-17 LAB — LIPASE, BLOOD: Lipase: 27 U/L (ref 11–51)

## 2020-11-17 LAB — POC URINE PREG, ED: Preg Test, Ur: POSITIVE — AB

## 2020-11-17 MED ORDER — PROMETHAZINE HCL 25 MG RE SUPP: 25.0000 mg | Freq: Four times a day (QID) | RECTAL | 0 refills | Status: DC | PRN

## 2020-11-17 MED ORDER — ONDANSETRON HCL 4 MG/2ML IJ SOLN
4.0000 mg | Freq: Once | INTRAMUSCULAR | Status: AC
  Administered 2020-11-17: 4 mg via INTRAVENOUS
  Filled 2020-11-17: qty 2

## 2020-11-17 MED ORDER — CEPHALEXIN 250 MG/5ML PO SUSR: 500.0000 mg | Freq: Four times a day (QID) | ORAL | 0 refills | Status: AC

## 2020-11-17 MED ORDER — SODIUM CHLORIDE 0.9 % IV BOLUS
1000.0000 mL | Freq: Once | INTRAVENOUS | Status: AC
  Administered 2020-11-17: 1000 mL via INTRAVENOUS

## 2020-11-17 MED ORDER — SODIUM CHLORIDE 0.9 % IV SOLN
1.0000 g | Freq: Once | INTRAVENOUS | Status: AC
  Administered 2020-11-17: 1 g via INTRAVENOUS
  Filled 2020-11-17: qty 10

## 2020-11-17 MED ORDER — METOCLOPRAMIDE HCL 5 MG/ML IJ SOLN
10.0000 mg | Freq: Once | INTRAMUSCULAR | Status: AC
  Administered 2020-11-17: 10 mg via INTRAVENOUS
  Filled 2020-11-17: qty 2

## 2020-11-17 NOTE — ED Provider Notes (Signed)
[facility] Emergency Department Provider Note  ____________________________________________   I have reviewed the triage vital signs and the nursing notes.   HISTORY  Chief Complaint Emesis During Pregnancy (9 weeks, and has a UTI)   History limited by: Not Limited   HPI Laura Sosa is a 35 y.o. female who presents to the emergency department today because of concern for emesis in early pregnancy. Patient states that she has had issues with emesis in previous pregnancies and that this pregnancy has also had significant emesis. Has tried promethezine but has not been able to keep any down. Was seen in the ED a few days ago and diagnosed with a UTI. States she has not been able to keep down her antibiotics. The patient denies any fevers.   Records reviewed. Per medical record review patient has a history of recent ED visit.   Past Medical History:  Diagnosis Date  . Headache   . History of kidney stones   . Migraine   . Pyelonephritis 04/17/2016    Patient Active Problem List   Diagnosis Date Noted  . 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 Infertility, female 12/18/2017  . History of pyelonephritis 12/18/2017  . History of kidney stones 12/18/2017  . Rubella non-immune status, antepartum 12/18/2017  . FHx: cleft palate 12/18/2017    Past Surgical History:  Procedure Laterality Date  . CYSTOSCOPY/RETROGRADE/URETEROSCOPY  03/15/2017   Procedure: CYSTOSCOPY/RETROGRADE/URETEROSCOPY;  Surgeon: Hildred Laser, MD;  Location: [facility] ORS;  Service: Urology;;    Prior to Admission medications   Medication Sig Start Date End Date Taking? Authorizing Provider  promethazine (PHENERGAN) 25 MG tablet Take 1 tablet (25 mg total) by mouth every 6 (six) hours as needed for nausea or vomiting. 11/14/20   Linzie Collin, MD  cephALEXin (KEFLEX) 500 MG capsule Take 1 capsule (500 mg total) by mouth 4 (four) times daily for 10 days. 11/13/20 11/23/20  Gilles Chiquito, MD  Doxylamine-Pyridoxine 10-10 MG TBEC Take 1 tablet by mouth 2 (two) times daily as needed for up to 7 days. 11/13/20 11/20/20  Gilles Chiquito, MD  Prenatal Vit-Fe Fumarate-FA (MULTIVITAMIN-PRENATAL) 27-0.8 MG TABS tablet Take 1 tablet by mouth daily at 12 noon.    [provider]    Allergies Patient has no known allergies.  Family History  Problem Relation Age of Onset  . Hypertension Mother   . Diabetes Mother   . Diabetes Father   . Hypertension Father   . Emphysema Maternal Grandmother   . Lung cancer Maternal Grandmother   . Asthma Daughter   . Hypertension Paternal Grandmother   . Heart defect Half-Sister        "boot shaped heart"  . Kidney cancer Neg Hx   . Bladder Cancer Neg Hx   . Breast cancer Neg Hx   . Ovarian cancer Neg Hx   . Colon cancer Neg Hx     Social History Social History   Tobacco Use  . Smoking status: Never Smoker  . Smokeless tobacco: Never Used  Vaping Use  . Vaping Use: Never used  Substance Use Topics  . Alcohol use: No  . Drug use: No    Review of Systems Constitutional: No fever/chills Eyes: No visual changes. ENT: No sore throat. Cardiovascular: Denies chest pain. Respiratory: Denies shortness of breath. Gastrointestinal: No abdominal pain. Positive for nausea and vomiting.  Genitourinary: Negative for dysuria. Musculoskeletal: Negative for back pain. Skin: Negative for rash. Neurological: Negative for headaches, focal  weakness or numbness.  ____________________________________________   PHYSICAL EXAM:  VITAL SIGNS: ED Triage Vitals  Enc Vitals Group     BP 11/17/20 1600 112/82     Pulse Rate 11/17/20 1600 79     Resp 11/17/20 1600 20     Temp 11/17/20 1600 97.8 F (36.6 C)     Temp src --      SpO2 11/17/20 1600 100 %     Weight 11/17/20 1601 172 lb (78 kg)     Height 11/17/20 1601 5\' 3"  (1.6 m)     Head Circumference --      Peak Flow --      Pain Score 11/17/20 1601 0   Constitutional:  Alert and oriented.  Eyes: Conjunctivae are normal.  ENT      Head: Normocephalic and atraumatic.      Nose: No congestion/rhinnorhea.      Mouth/Throat: Mucous membranes are moist.      Neck: No stridor. Hematological/Lymphatic/Immunilogical: No cervical lymphadenopathy. Cardiovascular: Normal rate, regular rhythm.  No murmurs, rubs, or gallops.  Respiratory: Normal respiratory effort without tachypnea nor retractions. Breath sounds are clear and equal bilaterally. No wheezes/rales/rhonchi. Gastrointestinal: Soft and non tender. No rebound. No guarding.  Genitourinary: Deferred Musculoskeletal: Normal range of motion in all extremities. No lower extremity edema. Neurologic:  Normal speech and language. No gross focal neurologic deficits are appreciated.  Skin:  Skin is warm, dry and intact. No rash noted. Psychiatric: Mood and affect are normal. Speech and behavior are normal. Patient exhibits appropriate insight and judgment.  ____________________________________________    LABS (pertinent positives/negatives)  CBC wbc 11.6, hgb 14.9, plt 319 Lipase 27 CMP wnl except na 134 ____________________________________________   EKG  None  ____________________________________________    RADIOLOGY  None  ____________________________________________   PROCEDURES  Procedures  ____________________________________________   INITIAL IMPRESSION / ASSESSMENT AND PLAN / ED COURSE  Pertinent labs & imaging results that were available during my care of the patient were reviewed by me and considered in my medical decision making (see chart for details).   Patient presented to the emergency department today because of concerns for nausea vomiting in the setting of early pregnancy.  Additionally had concerns that she has not been able to keep down her medications for a recently diagnosed UTI.  Blood work here without any concerning leukocytosis.  Patient is afebrile.  Urine does still  continue to show signs of infection.  Patient was given IV fluids as well as antinausea medications here.  She was given dose of IV antibiotics.  Patient was able to tolerate p.o. prior to discharge.  Discussed with patient and will give patient prescription for Phenergan suppositories.  Discussed with patient portance of follow-up with OB/GYN.   ____________________________________________   FINAL CLINICAL IMPRESSION(S) / ED DIAGNOSES  Final diagnoses:  Nausea and vomiting during pregnancy     Note: This dictation was prepared with Dragon dictation. Any transcriptional errors that result from this process are unintentional     11/19/20, MD 11/17/20 2228

## 2020-11-17 NOTE — ED Notes (Signed)
Patient provided with ice chips per request.

## 2020-11-17 NOTE — ED Triage Notes (Signed)
Pt states she is [redacted] weeks pregnant and has a UTI and is unable to keep down her antibiotics or food for the last 2 days.

## 2020-11-17 NOTE — ED Notes (Signed)
Introduced self to patient and plan of care discussed. Patient sleeping upon RN arrival in room but woke easily to verbal stimuli. Breathing unlabored with symmetric chest rise and fall and speaking in full sentences. Patient denies pain but reports continued n/v worse with movement. Pt denies desire for further anti nausea medication at this time.  MD made aware.  Urine specimen at bedside which RN collected and sent to lab. Patient in bed with bed low and locked and side rails raised x1. Call bell is in reach and vital signs updated. Pt denies further needs at this time.

## 2020-11-17 NOTE — Discharge Instructions (Addendum)
Please seek medical attention for any high fevers, chest pain, shortness of breath, change in behavior, persistent vomiting, bloody stool or any other new or concerning symptoms.  

## 2020-11-17 NOTE — ED Notes (Signed)
Patient tolerating ice and crackers with reported greater ease following medication administration.

## 2020-11-21 ENCOUNTER — Other Ambulatory Visit: Payer: Self-pay | Admitting: Obstetrics and Gynecology

## 2020-11-21 DIAGNOSIS — Z3200 Encounter for pregnancy test, result unknown: Secondary | ICD-10-CM

## 2020-11-21 DIAGNOSIS — N912 Amenorrhea, unspecified: Secondary | ICD-10-CM

## 2020-11-21 DIAGNOSIS — Z3491 Encounter for supervision of normal pregnancy, unspecified, first trimester: Secondary | ICD-10-CM

## 2020-11-22 ENCOUNTER — Other Ambulatory Visit: Payer: Self-pay | Admitting: Surgical

## 2020-11-22 MED ORDER — PROMETHAZINE HCL 25 MG RE SUPP: 25.0000 mg | Freq: Four times a day (QID) | RECTAL | 0 refills | Status: DC | PRN

## 2020-11-23 ENCOUNTER — Telehealth: Payer: Self-pay | Admitting: Obstetrics and Gynecology

## 2020-11-23 NOTE — Telephone Encounter (Signed)
New Message:  Pts mom called Corene Cornea)  And stated that [place] is still throwing up after taking the Phenergan.  She states that she has taken all 12 tablets that was prescribed to her.  She said the cost is very expensive for her ($70) and can not refill until tomorrow 11/24/20. Mom is asking if there is anything else that is more affordable for her to take.  She also stated that pt hasn't eaten in 17 days.

## 2020-11-24 NOTE — Telephone Encounter (Signed)
Please advise 

## 2020-11-25 NOTE — Telephone Encounter (Signed)
Patient called no answer LM via VM. apologized for the late reply and encouraged pt due to the amount of times and days that she has been vomiting and not eating. I encouraged the pt to be got to the ED to be treated.

## 2020-11-29 ENCOUNTER — Emergency Department: Payer: Medicaid Other

## 2020-11-29 ENCOUNTER — Inpatient Hospital Stay
Admission: EM | Admit: 2020-11-29 | Discharge: 2020-12-03 | DRG: 833 | Disposition: A | Discharge status: Home / Self Care | Payer: Medicaid Other | Attending: Obstetrics and Gynecology | Admitting: Obstetrics and Gynecology

## 2020-11-29 ENCOUNTER — Other Ambulatory Visit: Payer: Self-pay

## 2020-11-29 ENCOUNTER — Encounter: Payer: Self-pay | Admitting: Emergency Medicine

## 2020-11-29 DIAGNOSIS — E878 Other disorders of electrolyte and fluid balance, not elsewhere classified: Secondary | ICD-10-CM

## 2020-11-29 DIAGNOSIS — O210 Mild hyperemesis gravidarum: Secondary | ICD-10-CM | POA: Diagnosis present

## 2020-11-29 DIAGNOSIS — Z3A08 8 weeks gestation of pregnancy: Secondary | ICD-10-CM | POA: Diagnosis not present

## 2020-11-29 DIAGNOSIS — R55 Syncope and collapse: Secondary | ICD-10-CM

## 2020-11-29 DIAGNOSIS — Z20822 Contact with and (suspected) exposure to covid-19: Secondary | ICD-10-CM | POA: Diagnosis present

## 2020-11-29 DIAGNOSIS — Z3A01 Less than 8 weeks gestation of pregnancy: Secondary | ICD-10-CM | POA: Diagnosis not present

## 2020-11-29 DIAGNOSIS — R1110 Vomiting, unspecified: Secondary | ICD-10-CM

## 2020-11-29 DIAGNOSIS — Z87440 Personal history of urinary (tract) infections: Secondary | ICD-10-CM

## 2020-11-29 DIAGNOSIS — E860 Dehydration: Secondary | ICD-10-CM | POA: Diagnosis not present

## 2020-11-29 DIAGNOSIS — O211 Hyperemesis gravidarum with metabolic disturbance: Principal | ICD-10-CM | POA: Diagnosis present

## 2020-11-29 DIAGNOSIS — O209 Hemorrhage in early pregnancy, unspecified: Secondary | ICD-10-CM

## 2020-11-29 DIAGNOSIS — O219 Vomiting of pregnancy, unspecified: Secondary | ICD-10-CM | POA: Diagnosis not present

## 2020-11-29 LAB — BASIC METABOLIC PANEL
Anion gap: 13 (ref 5–15)
BUN: 13 mg/dL (ref 6–20)
CO2: 17 mmol/L — ABNORMAL LOW (ref 22–32)
Calcium: 9.7 mg/dL (ref 8.9–10.3)
Chloride: 101 mmol/L (ref 98–111)
Creatinine, Ser: 0.68 mg/dL (ref 0.44–1.00)
GFR, Estimated: >60 mL/min (ref >60)
Glucose, Bld: 76 mg/dL (ref 70–99)
Potassium: 3.7 mmol/L (ref 3.5–5.1)
Sodium: 131 mmol/L — ABNORMAL LOW (ref 135–145)

## 2020-11-29 LAB — CBG MONITORING, ED: Glucose-Capillary: 101 mg/dL — ABNORMAL HIGH (ref 70–99)

## 2020-11-29 LAB — URINALYSIS, COMPLETE (UACMP) WITH MICROSCOPIC
Bacteria, UA: NONE SEEN
Bilirubin Urine: NEGATIVE
Glucose, UA: >=500 mg/dL — AB
Ketones, ur: 80 mg/dL — AB
Leukocytes,Ua: NEGATIVE
Nitrite: NEGATIVE
Protein, ur: NEGATIVE mg/dL
Specific Gravity, Urine: 1.029 (ref 1.005–1.030)
pH: 6 (ref 5.0–8.0)

## 2020-11-29 LAB — TYPE AND SCREEN
ABO/RH(D): A POS
Antibody Screen: NEGATIVE

## 2020-11-29 LAB — CBC
HCT: 41.2 % (ref 36.0–46.0)
Hemoglobin: 14.6 g/dL (ref 12.0–15.0)
MCH: 29 pg (ref 26.0–34.0)
MCHC: 35.4 g/dL (ref 30.0–36.0)
MCV: 81.7 fL (ref 80.0–100.0)
Platelets: 279 10*3/uL (ref 150–400)
RBC: 5.04 MIL/uL (ref 3.87–5.11)
RDW: 11.9 % (ref 11.5–15.5)
WBC: 9.5 10*3/uL (ref 4.0–10.5)
nRBC: 0 % (ref 0.0–0.2)

## 2020-11-29 LAB — TROPONIN I (HIGH SENSITIVITY): Troponin I (High Sensitivity): 3 ng/L (ref <18)

## 2020-11-29 LAB — D-DIMER, QUANTITATIVE: D-Dimer, Quant: 0.27 ug/mL-FEU (ref 0.00–0.50)

## 2020-11-29 LAB — HCG, QUANTITATIVE, PREGNANCY: hCG, Beta Chain, Quant, S: 238274 m[IU]/mL — ABNORMAL HIGH (ref <5)

## 2020-11-29 LAB — MAGNESIUM: Magnesium: 1.8 mg/dL (ref 1.7–2.4)

## 2020-11-29 MED ORDER — FAMOTIDINE IN NACL 20-0.9 MG/50ML-% IV SOLN
20.0000 mg | INTRAVENOUS | Status: DC
  Administered 2020-11-29 – 2020-12-02 (×4): 20 mg via INTRAVENOUS
  Filled 2020-11-29 (×4): qty 50

## 2020-11-29 MED ORDER — HYDROXYZINE HCL 50 MG/ML IM SOLN
50.0000 mg | Freq: Four times a day (QID) | INTRAMUSCULAR | Status: DC | PRN
  Administered 2020-11-29 – 2020-11-30 (×2): 50 mg via INTRAMUSCULAR
  Filled 2020-11-29 (×5): qty 1

## 2020-11-29 MED ORDER — DOXYLAMINE SUCCINATE (SLEEP) 25 MG PO TABS
25.0000 mg | ORAL_TABLET | ORAL | Status: AC
  Administered 2020-11-29: 25 mg via ORAL
  Filled 2020-11-29: qty 1

## 2020-11-29 MED ORDER — HYDROXYZINE HCL 25 MG PO TABS
50.0000 mg | ORAL_TABLET | Freq: Four times a day (QID) | ORAL | Status: DC | PRN
  Filled 2020-11-29: qty 2

## 2020-11-29 MED ORDER — BISACODYL 10 MG RE SUPP
10.0000 mg | Freq: Every day | RECTAL | Status: DC | PRN
  Filled 2020-11-29: qty 1

## 2020-11-29 MED ORDER — DEXTROSE 5 % IN LACTATED RINGERS IV BOLUS
1000.0000 mL | Freq: Once | INTRAVENOUS | Status: AC
  Administered 2020-11-29: 1000 mL via INTRAVENOUS
  Filled 2020-11-29 (×2): qty 1000

## 2020-11-29 MED ORDER — PYRIDOXINE HCL 100 MG/ML IJ SOLN
100.0000 mg | Freq: Once | INTRAMUSCULAR | Status: AC
  Administered 2020-11-29: 100 mg via INTRAVENOUS
  Filled 2020-11-29: qty 1

## 2020-11-29 MED ORDER — ZOLPIDEM TARTRATE 5 MG PO TABS: 5.0000 mg | ORAL_TABLET | Freq: Every evening | ORAL | Status: DC | PRN

## 2020-11-29 MED ORDER — PROMETHAZINE HCL 25 MG RE SUPP
12.5000 mg | RECTAL | Status: DC | PRN
Start: 2020-11-29 — End: 2020-12-03
  Administered 2020-12-03: 25 mg via RECTAL
  Filled 2020-11-29 (×2): qty 1

## 2020-11-29 MED ORDER — ACETAMINOPHEN 325 MG PO TABS: 650.0000 mg | ORAL_TABLET | ORAL | Status: DC | PRN

## 2020-11-29 MED ORDER — ONDANSETRON HCL 8 MG PO TABS: 4.0000 mg | ORAL_TABLET | Freq: Three times a day (TID) | ORAL | 0 refills | Status: DC | PRN

## 2020-11-29 MED ORDER — DEXTROSE 5 % IN LACTATED RINGERS IV BOLUS
1000.0000 mL | Freq: Once | INTRAVENOUS | Status: AC
  Administered 2020-11-29: 1000 mL via INTRAVENOUS
  Filled 2020-11-29: qty 1000

## 2020-11-29 MED ORDER — CALCIUM CARBONATE ANTACID 500 MG PO CHEW: 2.0000 | CHEWABLE_TABLET | ORAL | Status: DC | PRN

## 2020-11-29 MED ORDER — PROMETHAZINE HCL 25 MG PO TABS
12.5000 mg | ORAL_TABLET | ORAL | Status: DC | PRN
  Filled 2020-11-29: qty 1

## 2020-11-29 MED ORDER — MECLIZINE HCL 25 MG PO TABS
25.0000 mg | ORAL_TABLET | Freq: Four times a day (QID) | ORAL | Status: DC
  Filled 2020-11-29 (×4): qty 1

## 2020-11-29 MED ORDER — THIAMINE HCL 100 MG/ML IJ SOLN
Freq: Once | INTRAVENOUS | Status: AC
  Filled 2020-11-29: qty 1000

## 2020-11-29 MED ORDER — SODIUM CHLORIDE 0.9 % IV SOLN
8.0000 mg | Freq: Three times a day (TID) | INTRAVENOUS | Status: DC | PRN
  Filled 2020-11-29: qty 4

## 2020-11-29 MED ORDER — ONDANSETRON HCL 4 MG/2ML IJ SOLN
4.0000 mg | Freq: Once | INTRAMUSCULAR | Status: AC
  Administered 2020-11-29: 4 mg via INTRAVENOUS
  Filled 2020-11-29: qty 2

## 2020-11-29 MED ORDER — ONDANSETRON 4 MG PO TBDP: 4.0000 mg | ORAL_TABLET | Freq: Three times a day (TID) | ORAL | Status: DC | PRN

## 2020-11-29 MED ORDER — ONDANSETRON HCL 4 MG/2ML IJ SOLN
4.0000 mg | Freq: Three times a day (TID) | INTRAMUSCULAR | Status: DC | PRN
  Administered 2020-11-30 (×2): 4 mg via INTRAVENOUS
  Filled 2020-11-29 (×2): qty 2

## 2020-11-29 NOTE — ED Provider Notes (Addendum)
[facility] Emergency Department Provider Note  ____________________________________________   Event Date/Time   First MD Initiated Contact with Patient 11/29/20 1343     (approximate)  I have reviewed the triage vital signs and the nursing notes.   HISTORY  Chief Complaint Loss of Consciousness   HPI Laura Sosa is a 35 y.o. female G3, P2 approximately [redacted] weeks pregnant by last LMP with a history of pyelonephritis, migraine headaches, kidney stones and previous hyperemesis with previous pregnancies and this pregnancy with 2 prior ED visits for nausea and vomiting who presents for assessment of some persistent worsening nausea and vomiting all nonbloody nonbilious as well as a syncopal episode today.  Patient states she is not sure what happened but then she passed out and she woke up on the floor.  She is not sure if she fell but does not currently have a headache neck pain or any other acute pain.  She denies any recent fevers, chills, vertigo, vision changes, chest pain, cough, shortness of breath, Donnell pain, back pain, burning with urination, vaginal bleeding, vaginal discharge, pelvic cramps or pain, rash or focal extremity weakness numbness or tingling.  No other recent syncopal episodes.  Denies tobacco abuse or any other medications at this time.  States he is been taking Phenergan but this is not helping.  States she had declined Zofran the past as previous child had been born with cleft palate and she was worried this may have caused this.         Past Medical History:  Diagnosis Date  . Headache   . History of kidney stones   . Migraine   . Pyelonephritis 04/17/2016    Patient Active Problem List   Diagnosis Date Noted  . 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 Infertility, female 12/18/2017  . History of pyelonephritis 12/18/2017  . History of kidney stones 12/18/2017  . Rubella non-immune status, antepartum 12/18/2017  . FHx: cleft  palate 12/18/2017    Past Surgical History:  Procedure Laterality Date  . CYSTOSCOPY/RETROGRADE/URETEROSCOPY  03/15/2017   Procedure: CYSTOSCOPY/RETROGRADE/URETEROSCOPY;  Surgeon: Hildred Laser, MD;  Location: [facility] ORS;  Service: Urology;;    Prior to Admission medications   Medication Sig Start Date End Date Taking? Authorizing Provider  ondansetron (ZOFRAN) 8 MG tablet Take 0.5 tablets (4 mg total) by mouth every 8 (eight) hours as needed for nausea or vomiting. 11/29/20  Yes Gilles Chiquito, MD  promethazine (PHENERGAN) 25 MG tablet Take 1 tablet (25 mg total) by mouth every 6 (six) hours as needed for nausea or vomiting. 11/14/20   Linzie Collin, MD  Prenatal Vit-Fe Fumarate-FA (MULTIVITAMIN-PRENATAL) 27-0.8 MG TABS tablet Take 1 tablet by mouth daily at 12 noon.    [provider]  promethazine (PHENERGAN) 25 MG suppository Place 1 suppository (25 mg total) rectally every 6 (six) hours as needed for nausea or vomiting. 11/22/20   Linzie Collin, MD    Allergies Patient has no known allergies.  Family History  Problem Relation Age of Onset  . Hypertension Mother   . Diabetes Mother   . Diabetes Father   . Hypertension Father   . Emphysema Maternal Grandmother   . Lung cancer Maternal Grandmother   . Asthma Daughter   . Hypertension Paternal Grandmother   . Heart defect Half-Sister        "boot shaped heart"  . Kidney cancer Neg Hx   . Bladder Cancer Neg Hx   . Breast cancer  Neg Hx   . Ovarian cancer Neg Hx   . Colon cancer Neg Hx     Social History Social History   Tobacco Use  . Smoking status: Never Smoker  . Smokeless tobacco: Never Used  Vaping Use  . Vaping Use: Never used  Substance Use Topics  . Alcohol use: No  . Drug use: No    Review of Systems  Review of Systems  Constitutional: Positive for malaise/fatigue. Negative for chills and fever.  HENT: Negative for sore throat.   Eyes: Negative for pain.  Respiratory: Negative for  cough and stridor.   Cardiovascular: Negative for chest pain.  Gastrointestinal: Positive for nausea and vomiting.  Genitourinary: Negative for dysuria.  Musculoskeletal: Negative for myalgias.  Skin: Negative for rash.  Neurological: Positive for loss of consciousness. Negative for seizures and headaches.  Psychiatric/Behavioral: Negative for suicidal ideas.  All other systems reviewed and are negative.     ____________________________________________   PHYSICAL EXAM:  VITAL SIGNS: ED Triage Vitals  Enc Vitals Group     BP 11/29/20 1330 115/76     Pulse Rate 11/29/20 1330 93     Resp 11/29/20 1330 20     Temp --      Temp Source 11/29/20 1330 Oral     SpO2 11/29/20 1330 98 %     Weight 11/29/20 1317 171 lb 15.3 oz (78 kg)     Height 11/29/20 1317 5\' 3"  (1.6 m)     Head Circumference --      Peak Flow --      Pain Score 11/29/20 1331 0     Pain Loc --      Pain Edu? --      Excl. in GC? --    Vitals:   11/29/20 1419 11/29/20 1426  BP:  107/70  Pulse:  66  Resp:  18  Temp: 97.8 F (36.6 C) 97.8 F (36.6 C)  SpO2:  100%   Physical Exam Vitals and nursing note reviewed.  Constitutional:      General: She is not in acute distress.    Appearance: She is well-developed.  HENT:     Head: Normocephalic and atraumatic.     Right Ear: External ear normal.     Left Ear: External ear normal.     Nose: Nose normal.     Mouth/Throat:     Mouth: Mucous membranes are dry.  Eyes:     Conjunctiva/sclera: Conjunctivae normal.  Cardiovascular:     Rate and Rhythm: Normal rate and regular rhythm.     Heart sounds: No murmur heard.   Pulmonary:     Effort: Pulmonary effort is normal. No respiratory distress.     Breath sounds: Normal breath sounds.  Abdominal:     Palpations: Abdomen is soft.     Tenderness: There is no abdominal tenderness.  Musculoskeletal:     Cervical back: Neck supple.  Skin:    General: Skin is warm and dry.     Capillary Refill: Capillary  refill takes more than 3 seconds.  Neurological:     Mental Status: She is alert and oriented to person, place, and time.  Psychiatric:        Mood and Affect: Mood normal.     Cranial nerves II through XII grossly intact.  No pronator drift.  No finger dysmetria.  Symmetric 5/5 strength of all extremities.  Sensation intact to light touch in all extremities.  Unremarkable unassisted gait.  ____________________________________________  LABS (all labs ordered are listed, but only abnormal results are displayed)  Labs Reviewed  BASIC METABOLIC PANEL - Abnormal; Notable for the following components:      Result Value   Sodium 131 (*)    CO2 17 (*)    All other components within normal limits  URINALYSIS, COMPLETE (UACMP) WITH MICROSCOPIC - Abnormal; Notable for the following components:   Color, Urine YELLOW (*)    APPearance CLEAR (*)    Glucose, UA >=500 (*)    Hgb urine dipstick SMALL (*)    Ketones, ur 80 (*)    All other components within normal limits  HCG, QUANTITATIVE, PREGNANCY - Abnormal; Notable for the following components:   hCG, Beta Chain, Quant, S 238,274 (*)    All other components within normal limits  CBC  MAGNESIUM  D-DIMER, QUANTITATIVE  CBG MONITORING, ED  TROPONIN I (HIGH SENSITIVITY)  TROPONIN I (HIGH SENSITIVITY)   ____________________________________________  EKG  Sinus rhythm with a ventricular rate of 99, normal axis, unremarkable cynical evidence of acute ischemia or other significant underlying arrhythmia. ____________________________________________  RADIOLOGY  ED MD interpretation: Chest x-ray has no focal consolidation, effusion, edema, pneumothorax or any other clear acute thoracic process.   Official radiology report(s): DG Chest 2 View  Result Date: 11/29/2020 CLINICAL DATA:  Syncope EXAM: CHEST - 2 VIEW COMPARISON:  None. FINDINGS: The heart size and mediastinal contours are within normal limits. Both lungs are clear. The visualized  skeletal structures are unremarkable. IMPRESSION: No active cardiopulmonary disease. Electronically Signed   By: Maudry Mayhew MD   On: 11/29/2020 14:37   US OB LESS THAN 14 WEEKS WITH OB TRANSVAGINAL  Result Date: 11/29/2020 CLINICAL DATA:  First trimester pregnancy, syncopal episode, unable to eat due to hyperemesis; LMP 09/20/2020 EXAM: OBSTETRIC <14 WK [place] AND TRANSVAGINAL OB US TECHNIQUE: Both transabdominal and transvaginal ultrasound examinations were performed for complete evaluation of the gestation as well as the maternal uterus, adnexal regions, and pelvic cul-de-sac. Transvaginal technique was performed to assess early pregnancy. COMPARISON:  None FINDINGS: Intrauterine gestational sac: Present, single Yolk sac:  Present Embryo:  Present Cardiac Activity: Present Heart Rate: 173 bpm CRL: 21.7 mm   8 w   6 d                  [place] EDC: 07/05/2021 Subchorionic hemorrhage:  None visualized. Maternal uterus/adnexae: RIGHT ovary measures 1.9 x 2.1 x 1.8 cm and contains a small corpus luteum. LEFT ovary normal size and morphology, 1.7 x 2.2 x 2.2 cm. No free pelvic fluid or adnexal masses. Remainder of uterus unremarkable. IMPRESSION: Single live intrauterine gestation at 8 weeks 6 days EGA by crown-rump length. No acute abnormalities. Electronically Signed   By: Ulyses Southward M.D.   On: 11/29/2020 16:01    ____________________________________________   PROCEDURES  Procedure(s) performed (including Critical Care):  .1-3 Lead EKG Interpretation Performed by: Gilles Chiquito, MD Authorized by: Gilles Chiquito, MD     Interpretation: normal     ECG rate assessment: normal     Rhythm: sinus rhythm     Ectopy: none     Conduction: normal       ____________________________________________   INITIAL IMPRESSION / ASSESSMENT AND PLAN / ED COURSE      Patient presents with above to history exam for assessment of syncopal episodes in the setting of several weeks of nonbloody nonbilious  emesis fatigue and decreased ability to tolerate p.o. not responding to suppository Phenergan.  This is a third time patient has been evaluated in the ED for this she states that she feels she is getting worse at home.  With regard to her syncopal episode differential includes orthostatic syncope secondary to dehydration, arrhythmia, symptomatic anemia, spontaneous pneumothorax, cardiomyopathy, PE and metabolic derangements.  EKG has some nonspecific findings but no evidence of significant arrhythmia.  Given nonspecific findings a troponin was obtained but given this is not elevated 3 hours after symptom onset Evalose patient for ACS or myocarditis.  Chest x-ray shows no evidence of pneumonia pneumothorax or volume overload patient does not appear overloaded on exam I believe patient for acute heart failure or cardiomyopathy.  BMP remarkable for bicarb of 17 sodium of 131 without any other significant derangements.  Suspect this represents some starvation ketosis.  CBC has no leukocytosis and no evidence of acute anemia that explain patient syncopal episode.  In addition she denies any recent bleeding.  D-dimer is less than 0.5 and have overall low suspicion for PE.  UA has some glucose and ketones but no evidence of any persistent urinary tract infection.  hCG is at appropriate level and pelvic ultrasound confirms IUP without any complications noted.  Suspect patient syncopized secondary to her dehydration from hyperemesis.  She was given B6 and doxylamine although she was still feeling little nauseous after this and still felt she cannot adequately hydrate at home.  Thus a consulted with Dr. Valentino Saxon and he will admit the patient for opts for overnight hydration and nausea control.        ____________________________________________   FINAL CLINICAL IMPRESSION(S) / ED DIAGNOSES  Final diagnoses:  Syncope and collapse  Dehydration  Hyperemesis    Medications  pyridOXINE (B-6) injection 100 mg  (100 mg Intravenous Given 11/29/20 1636)  doxylamine (Sleep) (UNISOM) tablet 25 mg (25 mg Oral Given 11/29/20 1635)  dextrose 5% lactated ringers bolus 1,000 mL (0 mLs Intravenous Stopped 11/29/20 1717)  ondansetron (ZOFRAN) injection 4 mg (4 mg Intravenous Given 11/29/20 1719)  dextrose 5% lactated ringers bolus 1,000 mL (1,000 mLs Intravenous New Bag/Given 11/29/20 1647)     ED Discharge Orders         Ordered    ondansetron (ZOFRAN) 8 MG tablet  Every 8 hours PRN        11/29/20 1814           Note:  This document was prepared using Dragon voice recognition software and may include unintentional dictation errors.   Gilles Chiquito, MD 11/29/20 1816    Gilles Chiquito, MD 11/29/20 [phone]

## 2020-11-29 NOTE — ED Notes (Signed)
See triage note, pt [redacted] weeks pregnant, syncopal episode this AM and vomiting. Pt in NAD, alert and oriented.  Call bell in reach

## 2020-11-29 NOTE — ED Triage Notes (Signed)
Patient to ER for c/o syncopal episode this am. Patient reports she is [redacted] weeks pregnant, has had issues with hyperemesis and has had to come to ER for the same several times.

## 2020-11-29 NOTE — ED Notes (Signed)
Pharmacy contacted for missing meds

## 2020-11-29 NOTE — Consult Note (Signed)
Reason for Consult: Hyperemesis Referring Physician:   [name] is an 35 y.o. G51P2002 female who presented to the Emergency Room yesterday evening with complaints of persistent nausea/vomiting. She is approximately [redacted] weeks pregnant by today's sono, with unsure LMP (thought she might have been closer to 10 or [redacted] weeks pregnant).  She has had 2 prior ED visits for similar complaints over the past several weeks. She has been taking Phenergan and OTC anti-emetics but notes it is not helping.  Has declined use of Zofran this pregnancy due to previous child being born with cleft palate and is concerned of risk for current pregnancy. She has a history of hyperemesis with her previous pregnancies as well. Laura also reports episode of syncopal episode x 1 yesterday, does not think she hit her head.    Of note, she also has a PMH significant for pyelonephritis, migraines, and kidney stones. She was diagnosed with a UTI several weeks ago (nitrite positive UA, E. Coli on culture 11/13/20), attempted compliance with medications.    Pertinent Gynecological History: Menses: reports irregular cycles Contraception: none Blood transfusions: none Sexually transmitted diseases: no past history Previous GYN Procedures: LEEP x 1 Last pap: abnormal: ASC-H Date: 79/2021.  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    OB History  Gravida Para Term Preterm AB Living  3 2 2     2   SAB IAB Ectopic Multiple Live Births        0 2    # Outcome Date GA Lbr Len/2nd Weight Sex Delivery Anes PTL Lv  3 Current           2 Term 06/17/18 [redacted]w[redacted]d / 00:29 2780 g M Vag-Spont EPI  LIV  1 Term 2011   3799 g F Vag-Spont   LIV    Obstetric Comments  G1 - diagnosed with cleft palate at age 43      Past Medical History:  Diagnosis Date  . Headache   . History of kidney stones   . Migraine   . Pyelonephritis 04/17/2016    Past Surgical History:   Procedure Laterality Date  . CYSTOSCOPY/RETROGRADE/URETEROSCOPY  03/15/2017   Procedure: CYSTOSCOPY/RETROGRADE/URETEROSCOPY;  Surgeon: 03/17/2017, MD;  Location: [facility] ORS;  Service: Urology;;    Family History  Problem Relation Age of Onset  . Hypertension Mother   . Diabetes Mother   . Diabetes Father   . Hypertension Father   . Emphysema Maternal Grandmother   . Lung cancer Maternal Grandmother   . Asthma Daughter   . Hypertension Paternal Grandmother   . Heart defect Half-Sister        "boot shaped heart"  . Kidney cancer Neg Hx   . Bladder Cancer Neg Hx   . Breast cancer Neg Hx   . Ovarian cancer Neg Hx   . Colon cancer Neg Hx     Social History:  reports that she has never smoked. She has never used smokeless tobacco. She reports that she does not drink alcohol and does not use drugs.  Allergies: No Known Allergies  No current facility-administered medications on file prior to encounter.   Current Outpatient Medications on File Prior to Encounter  Medication Sig Dispense Refill  . Prenatal Vit-Fe Fumarate-FA (MULTIVITAMIN-PRENATAL) 27-0.8 MG TABS tablet Take 1 tablet by mouth daily at 12 noon.    . promethazine (PHENERGAN) 25 MG suppository Place 1 suppository (25 mg total) rectally every 6 (  six) hours as needed for nausea or vomiting. 12 each 0  . promethazine (PHENERGAN) 25 MG tablet Take 1 tablet (25 mg total) by mouth every 6 (six) hours as needed for nausea or vomiting. 45 tablet 0     Review of Systems  Constitutional: Negative for chills and fever.  HENT: Negative.   Eyes: Negative.   Respiratory: Negative.   Cardiovascular: Negative.   Gastrointestinal: Positive for nausea and vomiting. Negative for abdominal pain and diarrhea.  Endocrine: Negative.   Genitourinary: Negative for dysuria, flank pain, frequency, hematuria, pelvic pain, vaginal bleeding and vaginal discharge.  Allergic/Immunologic: Negative.   Neurological: Positive for syncope.  Negative for dizziness and headaches.  Hematological: Negative.   Psychiatric/Behavioral: Negative.     Blood pressure 101/60, pulse 60, temperature 97.8 F (36.6 C), temperature source Oral, resp. rate 16, height 5\' 3"  (1.6 m), weight 78 kg, SpO2 100 %. Physical Exam Constitutional:      General: She is not in acute distress. HENT:     Head: Normocephalic and atraumatic.     Nose: Nose normal. No congestion.     Mouth/Throat:     Mouth: Mucous membranes are moist.     Pharynx: Oropharynx is clear.  Eyes:     Extraocular Movements: Extraocular movements intact.     Conjunctiva/sclera: Conjunctivae normal.     Pupils: Pupils are equal, round, and reactive to light.  Cardiovascular:     Rate and Rhythm: Normal rate and regular rhythm.     Pulses: Normal pulses.     Heart sounds: Normal heart sounds. No murmur heard. No gallop.   Pulmonary:     Effort: Pulmonary effort is normal.     Breath sounds: Normal breath sounds. No wheezing.  Abdominal:     General: Abdomen is flat.     Palpations: Abdomen is soft. There is no mass.     Tenderness: There is no abdominal tenderness.  Musculoskeletal:        General: Normal range of motion.  Skin:    General: Skin is warm and dry.  Neurological:     General: No focal deficit present.     Mental Status: She is alert and oriented to person, place, and time.  Psychiatric:        Mood and Affect: Mood normal.        Behavior: Behavior normal.     Results for orders placed or performed during the hospital encounter of 11/29/20 (from the past 48 hour(s))  Basic metabolic panel     Status: Abnormal   Collection Time: 11/29/20  1:32 PM  Result Value Ref Range   Sodium 131 (L) 135 - 145 mmol/L   Potassium 3.7 3.5 - 5.1 mmol/L   Chloride 101 98 - 111 mmol/L   CO2 17 (L) 22 - 32 mmol/L   Glucose, Bld 76 70 - 99 mg/dL    Comment: Glucose reference range applies only to samples taken after fasting for at least 8 hours.   BUN 13 6 - 20  mg/dL   Creatinine, Ser 4.250.68 0.44 - 1.00 mg/dL   Calcium 9.7 8.9 - 95.610.3 mg/dL   GFR, Estimated >38>60 >75>60 mL/min    Comment: (NOTE) Calculated using the CKD-EPI Creatinine Equation (2021)    Anion gap 13 5 - 15    Comment: Performed at [facility], [address] [place], [place] [address]  CBC     Status: None   Collection Time: 11/29/20  1:32 PM  Result Value Ref Range   WBC 9.5 4.0 - 10.5 K/uL   RBC 5.04 3.87 - 5.11 MIL/uL   Hemoglobin 14.6 12.0 - 15.0 g/dL   HCT 48.1 85.6 - 31.4 %   MCV 81.7 80.0 - 100.0 fL   MCH 29.0 26.0 - 34.0 pg   MCHC 35.4 30.0 - 36.0 g/dL   RDW 97.0 26.3 - 78.5 %   Platelets 279 150 - 400 K/uL   nRBC 0.0 0.0 - 0.2 %    Comment: Performed at [facility], [address] [place], [place] [address]  Urinalysis, Complete w Microscopic Urine, Clean Catch     Status: Abnormal   Collection Time: 11/29/20  1:32 PM  Result Value Ref Range   Color, Urine YELLOW (A) YELLOW   APPearance CLEAR (A) CLEAR   Specific Gravity, Urine 1.029 1.005 - 1.030   pH 6.0 5.0 - 8.0   Glucose, UA >=500 (A) NEGATIVE mg/dL   Hgb urine dipstick SMALL (A) NEGATIVE   Bilirubin Urine NEGATIVE NEGATIVE   Ketones, ur 80 (A) NEGATIVE mg/dL   Protein, ur NEGATIVE NEGATIVE mg/dL   Nitrite NEGATIVE NEGATIVE   Leukocytes,Ua NEGATIVE NEGATIVE   RBC / HPF 0-5 0 - 5 RBC/hpf   WBC, UA 0-5 0 - 5 WBC/hpf   Bacteria, UA NONE SEEN NONE SEEN   Squamous Epithelial / LPF 0-5 0 - 5   Mucus PRESENT     Comment: Performed at [facility], [address] [place], [place] [address]  Magnesium     Status: None   Collection Time: 11/29/20  1:32 PM  Result Value Ref Range   Magnesium 1.8 1.7 - 2.4 mg/dL    Comment: Performed at [facility], [address] [place], [place] [address]  Troponin I (High Sensitivity)     Status: None   Collection Time: 11/29/20  1:32 PM  Result Value Ref Range   Troponin I (High Sensitivity) 3 <18 ng/L    Comment:  (NOTE) Elevated high sensitivity troponin I (hsTnI) values and significant  changes across serial measurements may suggest ACS but many other  chronic and acute conditions are known to elevate hsTnI results.  Refer to the "Links" section for chest pain algorithms and additional  guidance. Performed at [facility], [address] [place], [place] [address]   hCG, quantitative, pregnancy     Status: Abnormal   Collection Time: 11/29/20  1:32 PM  Result Value Ref Range   hCG, Beta Chain, Quant, S 238,274 (H) <5 mIU/mL    Comment: RESULT CONFIRMED BY MANUAL DILUTION DAS          GEST. AGE      CONC.  (mIU/mL)   <=1 WEEK        5 - 50     2 WEEKS       50 - 500     3 WEEKS       100 - 10,000     4 WEEKS     1,000 - 30,000     5 WEEKS     3,500 - 115,000   6-8 WEEKS     12,000 - 270,000    12 WEEKS     15,000 - 220,000        FEMALE AND NON-PREGNANT FEMALE:     LESS THAN 5 mIU/mL Performed at [facility], [address] [place], [place] [address]   D-dimer, quantitative     Status: None  Collection Time: 11/29/20  1:58 PM  Result Value Ref Range   D-Dimer, Quant 0.27 0.00 - 0.50 ug/mL-FEU    Comment: (NOTE) At the manufacturer cut-off value of 0.5 g/mL FEU, this assay has a negative predictive value of 95-100%.This assay is intended for use in conjunction with a clinical pretest probability (PTP) assessment model to exclude pulmonary embolism (PE) and deep venous thrombosis (DVT) in outpatients suspected of PE or DVT. Results should be correlated with clinical presentation. Performed at [facility], [address] [place], [place] [address]     DG Chest 2 View  Result Date: 11/29/2020 CLINICAL DATA:  Syncope EXAM: CHEST - 2 VIEW COMPARISON:  None. FINDINGS: The heart size and mediastinal contours are within normal limits. Both lungs are clear. The visualized skeletal structures are unremarkable. IMPRESSION: No active cardiopulmonary disease.  Electronically Signed   By: Maudry Mayhew MD   On: 11/29/2020 14:37   US OB LESS THAN 14 WEEKS WITH OB TRANSVAGINAL  Result Date: 11/29/2020 CLINICAL DATA:  First trimester pregnancy, syncopal episode, unable to eat due to hyperemesis; LMP 09/20/2020 EXAM: OBSTETRIC <14 WK [place] AND TRANSVAGINAL OB US TECHNIQUE: Both transabdominal and transvaginal ultrasound examinations were performed for complete evaluation of the gestation as well as the maternal uterus, adnexal regions, and pelvic cul-de-sac. Transvaginal technique was performed to assess early pregnancy. COMPARISON:  None FINDINGS: Intrauterine gestational sac: Present, single Yolk sac:  Present Embryo:  Present Cardiac Activity: Present Heart Rate: 173 bpm CRL: 21.7 mm   8 w   6 d                  [place] EDC: 07/05/2021 Subchorionic hemorrhage:  None visualized. Maternal uterus/adnexae: RIGHT ovary measures 1.9 x 2.1 x 1.8 cm and contains a small corpus luteum. LEFT ovary normal size and morphology, 1.7 x 2.2 x 2.2 cm. No free pelvic fluid or adnexal masses. Remainder of uterus unremarkable. IMPRESSION: Single live intrauterine gestation at 8 weeks 6 days EGA by crown-rump length. No acute abnormalities. Electronically Signed   By: Ulyses Southward M.D.   On: 11/29/2020 16:01    Assessment/Plan: 1. Hyperemesis gravidarum - patient with refractory nausea and vomiting, failed treatments include Phenergan, Unisom and B6.  Given IV Phenergan, Reglan, and 1 dose of IV Zofran (patient ok to try once), barely able to sip water.  Also given D5LR bolus. Did not feel comfortable going home. Admitted for further rehydration and repletion of electrolytes.  Will order Meclizine scheduled (PO) and IV Phenergan and Reglan prn. Also will start banana bag.  2. Syncope - most likely secondary to dehydration as workup has been otherwise negative. Should improve with rehydration.  3. H/o E. Coli UTI, patient notes treatment with liquid Keflex prescription. Today's UA negative for  nitrites. Likely resolution noted.  4. Pregnancy - viable pregnancy noted on recent imaging, dates inconsistent with sono.  EDD now based on sono.  5 DVT prophylaxis - patient able to ambulate as tolerated. Low risk for DVT.  6. Pepcid for GI prophylaxis.    Hildred Laser, MD [facility]

## 2020-11-29 NOTE — ED Notes (Signed)
Warm blanket given

## 2020-11-29 NOTE — ED Notes (Signed)
Pt provided ginger ale, ok per dr Katrinka Blazing

## 2020-11-29 NOTE — ED Notes (Signed)
Report given to Kierra, RN 

## 2020-11-29 NOTE — ED Notes (Signed)
Patient verbalized to this RN that she decided to stay and get admitted.  This RN notified Dr. Katrinka Blazing who verbalized understanding.

## 2020-11-30 ENCOUNTER — Encounter: Payer: Self-pay | Admitting: Obstetrics and Gynecology

## 2020-11-30 DIAGNOSIS — Z3A01 Less than 8 weeks gestation of pregnancy: Secondary | ICD-10-CM | POA: Diagnosis not present

## 2020-11-30 DIAGNOSIS — O210 Mild hyperemesis gravidarum: Secondary | ICD-10-CM | POA: Diagnosis not present

## 2020-11-30 DIAGNOSIS — Z20822 Contact with and (suspected) exposure to covid-19: Secondary | ICD-10-CM | POA: Diagnosis not present

## 2020-11-30 DIAGNOSIS — E860 Dehydration: Secondary | ICD-10-CM | POA: Diagnosis not present

## 2020-11-30 DIAGNOSIS — O211 Hyperemesis gravidarum with metabolic disturbance: Secondary | ICD-10-CM | POA: Diagnosis not present

## 2020-11-30 DIAGNOSIS — Z87440 Personal history of urinary (tract) infections: Secondary | ICD-10-CM | POA: Diagnosis not present

## 2020-11-30 DIAGNOSIS — E878 Other disorders of electrolyte and fluid balance, not elsewhere classified: Secondary | ICD-10-CM | POA: Diagnosis not present

## 2020-11-30 DIAGNOSIS — R55 Syncope and collapse: Secondary | ICD-10-CM

## 2020-11-30 DIAGNOSIS — Z3A08 8 weeks gestation of pregnancy: Secondary | ICD-10-CM | POA: Diagnosis not present

## 2020-11-30 DIAGNOSIS — O26891 Other specified pregnancy related conditions, first trimester: Secondary | ICD-10-CM | POA: Diagnosis not present

## 2020-11-30 DIAGNOSIS — O219 Vomiting of pregnancy, unspecified: Secondary | ICD-10-CM | POA: Diagnosis not present

## 2020-11-30 LAB — BASIC METABOLIC PANEL
Anion gap: 8 (ref 5–15)
BUN: 7 mg/dL (ref 6–20)
CO2: 21 mmol/L — ABNORMAL LOW (ref 22–32)
Calcium: 8.3 mg/dL — ABNORMAL LOW (ref 8.9–10.3)
Chloride: 104 mmol/L (ref 98–111)
Creatinine, Ser: 0.6 mg/dL (ref 0.44–1.00)
GFR, Estimated: >60 mL/min (ref >60)
Glucose, Bld: 69 mg/dL — ABNORMAL LOW (ref 70–99)
Potassium: 3.6 mmol/L (ref 3.5–5.1)
Sodium: 133 mmol/L — ABNORMAL LOW (ref 135–145)

## 2020-11-30 LAB — SARS CORONAVIRUS 2 (TAT 6-24 HRS): SARS Coronavirus 2: NEGATIVE

## 2020-11-30 MED ORDER — SCOPOLAMINE 1 MG/3DAYS TD PT72
1.0000 | MEDICATED_PATCH | TRANSDERMAL | Status: DC
  Administered 2020-11-30 – 2020-12-03 (×2): 1.5 mg via TRANSDERMAL
  Filled 2020-11-30 (×2): qty 1

## 2020-11-30 MED ORDER — ADULT MULTIVITAMIN W/MINERALS CH: 1.0000 | ORAL_TABLET | Freq: Every day | ORAL | Status: DC

## 2020-11-30 MED ORDER — SODIUM CHLORIDE 0.9 % IV SOLN
25.0000 mg | Freq: Four times a day (QID) | INTRAVENOUS | Status: DC
  Administered 2020-12-01 – 2020-12-02 (×6): 25 mg via INTRAVENOUS
  Filled 2020-11-30 (×14): qty 1

## 2020-11-30 MED ORDER — DEXTROSE IN LACTATED RINGERS 5 % IV SOLN: INTRAVENOUS | Status: DC

## 2020-11-30 MED ORDER — METOCLOPRAMIDE HCL 5 MG/ML IJ SOLN
10.0000 mg | Freq: Four times a day (QID) | INTRAMUSCULAR | Status: DC
  Administered 2020-11-30 – 2020-12-02 (×6): 10 mg via INTRAVENOUS
  Filled 2020-11-30 (×7): qty 2

## 2020-11-30 MED ORDER — BOOST / RESOURCE BREEZE PO LIQD CUSTOM: 1.0000 | Freq: Three times a day (TID) | ORAL | Status: DC

## 2020-11-30 MED ORDER — PRENATAL MULTIVITAMIN CH: 1.0000 | ORAL_TABLET | Freq: Every day | ORAL | Status: DC

## 2020-11-30 MED ORDER — METOCLOPRAMIDE HCL 5 MG/ML IJ SOLN
10.0000 mg | Freq: Four times a day (QID) | INTRAMUSCULAR | Status: DC
  Administered 2020-11-30: 10 mg via INTRAVENOUS
  Filled 2020-11-30: qty 2

## 2020-11-30 NOTE — H&P (Signed)
Reason for Consult: Hyperemesis Referring Physician: Antoine Primas, MD (ER Physician)  Laura Sosa is an 35 y.o. [phone] female who presented to the Emergency Room yesterday evening with complaints of persistent nausea/vomiting. She is approximately [redacted] weeks pregnant by today's sono, with unsure LMP (thought she might have been closer to 10 or [redacted] weeks pregnant).  She has had 2 prior ED visits for similar complaints over the past several weeks. She has been taking Phenergan and OTC anti-emetics but notes it is not helping.  Has declined use of Zofran this pregnancy due to previous child being born with cleft palate and is concerned of risk for current pregnancy. She has a history of hyperemesis with her previous pregnancies as well. Laura also reports episode of syncopal episode x 1 yesterday, does not think she hit her head.    Of note, she also has a PMH significant for pyelonephritis, migraines, and kidney stones. She was diagnosed with a UTI several weeks ago (nitrite positive UA, E. Coli on culture 11/13/20), attempted compliance with medications.    Pertinent Gynecological History: Menses: reports irregular cycles Contraception: none Blood transfusions: none Sexually transmitted diseases: no past history Previous GYN Procedures: LEEP x 1 Last pap: abnormal: ASC-H Date: 79/2021.  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    OB History  Gravida Para Term Preterm AB Living  3 2 2     2   SAB IAB Ectopic Multiple Live Births        0 2    # Outcome Date GA Lbr Len/2nd Weight Sex Delivery Anes PTL Lv  3 Current           2 Term 06/17/18 [redacted]w[redacted]d / 00:29 2780 g M Vag-Spont EPI  LIV  1 Term 2011   3799 g F Vag-Spont   LIV    Obstetric Comments  G1 - diagnosed with cleft palate at age 58      Past Medical History:  Diagnosis Date  . Headache   . History of kidney stones   . Migraine   . Pyelonephritis 04/17/2016     Past Surgical History:  Procedure Laterality Date  . CYSTOSCOPY/RETROGRADE/URETEROSCOPY  03/15/2017   Procedure: CYSTOSCOPY/RETROGRADE/URETEROSCOPY;  Surgeon: 03/17/2017, MD;  Location: [facility] ORS;  Service: Urology;;    Family History  Problem Relation Age of Onset  . Hypertension Mother   . Diabetes Mother   . Diabetes Father   . Hypertension Father   . Emphysema Maternal Grandmother   . Lung cancer Maternal Grandmother   . Asthma Daughter   . Hypertension Paternal Grandmother   . Heart defect Half-Sister        "boot shaped heart"  . Kidney cancer Neg Hx   . Bladder Cancer Neg Hx   . Breast cancer Neg Hx   . Ovarian cancer Neg Hx   . Colon cancer Neg Hx     Social History:  reports that she has never smoked. She has never used smokeless tobacco. She reports that she does not drink alcohol and does not use drugs.  Allergies: No Known Allergies  No current facility-administered medications on file prior to encounter.   Current Outpatient Medications on File Prior to Encounter  Medication Sig Dispense Refill  . Prenatal Vit-Fe Fumarate-FA (MULTIVITAMIN-PRENATAL) 27-0.8 MG TABS tablet Take 1 tablet by mouth daily at 12 noon.    . promethazine (PHENERGAN) 25 MG suppository Place 1 suppository (25 mg  total) rectally every 6 (six) hours as needed for nausea or vomiting. 12 each 0  . promethazine (PHENERGAN) 25 MG tablet Take 1 tablet (25 mg total) by mouth every 6 (six) hours as needed for nausea or vomiting. 45 tablet 0     Review of Systems  Constitutional: Negative for chills and fever.  HENT: Negative.   Eyes: Negative.   Respiratory: Negative.   Cardiovascular: Negative.   Gastrointestinal: Positive for nausea and vomiting. Negative for abdominal pain and diarrhea.  Endocrine: Negative.   Genitourinary: Negative for dysuria, flank pain, frequency, hematuria, pelvic pain, vaginal bleeding and vaginal discharge.  Allergic/Immunologic: Negative.    Neurological: Positive for syncope. Negative for dizziness and headaches.  Hematological: Negative.   Psychiatric/Behavioral: Negative.     Blood pressure 101/60, pulse 60, temperature 97.8 F (36.6 C), temperature source Oral, resp. rate 16, height 5\' 3"  (1.6 m), weight 78 kg, SpO2 100 %. Physical Exam Constitutional:      General: She is not in acute distress. HENT:     Head: Normocephalic and atraumatic.     Nose: Nose normal. No congestion.     Mouth/Throat:     Mouth: Mucous membranes are moist.     Pharynx: Oropharynx is clear.  Eyes:     Extraocular Movements: Extraocular movements intact.     Conjunctiva/sclera: Conjunctivae normal.     Pupils: Pupils are equal, round, and reactive to light.  Cardiovascular:     Rate and Rhythm: Normal rate and regular rhythm.     Pulses: Normal pulses.     Heart sounds: Normal heart sounds. No murmur heard. No gallop.   Pulmonary:     Effort: Pulmonary effort is normal.     Breath sounds: Normal breath sounds. No wheezing.  Abdominal:     General: Abdomen is flat.     Palpations: Abdomen is soft. There is no mass.     Tenderness: There is no abdominal tenderness.  Musculoskeletal:        General: Normal range of motion.  Skin:    General: Skin is warm and dry.  Neurological:     General: No focal deficit present.     Mental Status: She is alert and oriented to person, place, and time.  Psychiatric:        Mood and Affect: Mood normal.        Behavior: Behavior normal.     Results for orders placed or performed during the hospital encounter of 11/29/20 (from the past 48 hour(s))  Basic metabolic panel     Status: Abnormal   Collection Time: 11/29/20  1:32 PM  Result Value Ref Range   Sodium 131 (L) 135 - 145 mmol/L   Potassium 3.7 3.5 - 5.1 mmol/L   Chloride 101 98 - 111 mmol/L   CO2 17 (L) 22 - 32 mmol/L   Glucose, Bld 76 70 - 99 mg/dL    Comment: Glucose reference range applies only to samples taken after fasting for  at least 8 hours.   BUN 13 6 - 20 mg/dL   Creatinine, Ser 1.610.68 0.44 - 1.00 mg/dL   Calcium 9.7 8.9 - 09.610.3 mg/dL   GFR, Estimated >04>60 >54>60 mL/min    Comment: (NOTE) Calculated using the CKD-EPI Creatinine Equation (2021)    Anion gap 13 5 - 15    Comment: Performed at [facility], [address] [place], [place] [address]  CBC     Status: None   Collection Time: 11/29/20  1:32 PM  Result Value Ref Range   WBC 9.5 4.0 - 10.5 K/uL   RBC 5.04 3.87 - 5.11 MIL/uL   Hemoglobin 14.6 12.0 - 15.0 g/dL   HCT 17.5 10.2 - 58.5 %   MCV 81.7 80.0 - 100.0 fL   MCH 29.0 26.0 - 34.0 pg   MCHC 35.4 30.0 - 36.0 g/dL   RDW 27.7 82.4 - 23.5 %   Platelets 279 150 - 400 K/uL   nRBC 0.0 0.0 - 0.2 %    Comment: Performed at [facility], [address] [place], [place] [address]  Urinalysis, Complete w Microscopic Urine, Clean Catch     Status: Abnormal   Collection Time: 11/29/20  1:32 PM  Result Value Ref Range   Color, Urine YELLOW (A) YELLOW   APPearance CLEAR (A) CLEAR   Specific Gravity, Urine 1.029 1.005 - 1.030   pH 6.0 5.0 - 8.0   Glucose, UA >=500 (A) NEGATIVE mg/dL   Hgb urine dipstick SMALL (A) NEGATIVE   Bilirubin Urine NEGATIVE NEGATIVE   Ketones, ur 80 (A) NEGATIVE mg/dL   Protein, ur NEGATIVE NEGATIVE mg/dL   Nitrite NEGATIVE NEGATIVE   Leukocytes,Ua NEGATIVE NEGATIVE   RBC / HPF 0-5 0 - 5 RBC/hpf   WBC, UA 0-5 0 - 5 WBC/hpf   Bacteria, UA NONE SEEN NONE SEEN   Squamous Epithelial / LPF 0-5 0 - 5   Mucus PRESENT     Comment: Performed at [facility], [address] [place], [place] [address]  Magnesium     Status: None   Collection Time: 11/29/20  1:32 PM  Result Value Ref Range   Magnesium 1.8 1.7 - 2.4 mg/dL    Comment: Performed at [facility], [address] [place], [place] [address]  Troponin I (High Sensitivity)     Status: None   Collection Time: 11/29/20  1:32 PM  Result Value Ref Range   Troponin I (High Sensitivity) 3  <18 ng/L    Comment: (NOTE) Elevated high sensitivity troponin I (hsTnI) values and significant  changes across serial measurements may suggest ACS but many other  chronic and acute conditions are known to elevate hsTnI results.  Refer to the "Links" section for chest pain algorithms and additional  guidance. Performed at [facility], [address] [place], [place] [address]   hCG, quantitative, pregnancy     Status: Abnormal   Collection Time: 11/29/20  1:32 PM  Result Value Ref Range   hCG, Beta Chain, Quant, S 238,274 (H) <5 mIU/mL    Comment: RESULT CONFIRMED BY MANUAL DILUTION DAS          GEST. AGE      CONC.  (mIU/mL)   <=1 WEEK        5 - 50     2 WEEKS       50 - 500     3 WEEKS       100 - 10,000     4 WEEKS     1,000 - 30,000     5 WEEKS     3,500 - 115,000   6-8 WEEKS     12,000 - 270,000    12 WEEKS     15,000 - 220,000        FEMALE AND NON-PREGNANT FEMALE:     LESS THAN 5 mIU/mL Performed at [facility], [address] [place], [place] [address]   D-dimer, quantitative  Status: None   Collection Time: 11/29/20  1:58 PM  Result Value Ref Range   D-Dimer, Quant 0.27 0.00 - 0.50 ug/mL-FEU    Comment: (NOTE) At the manufacturer cut-off value of 0.5 g/mL FEU, this assay has a negative predictive value of 95-100%.This assay is intended for use in conjunction with a clinical pretest probability (PTP) assessment model to exclude pulmonary embolism (PE) and deep venous thrombosis (DVT) in outpatients suspected of PE or DVT. Results should be correlated with clinical presentation. Performed at [facility], [address] [place], [place] [address]     DG Chest 2 View  Result Date: 11/29/2020 CLINICAL DATA:  Syncope EXAM: CHEST - 2 VIEW COMPARISON:  None. FINDINGS: The heart size and mediastinal contours are within normal limits. Both lungs are clear. The visualized skeletal structures are unremarkable. IMPRESSION: No active  cardiopulmonary disease. Electronically Signed   By: Maudry Mayhew MD   On: 11/29/2020 14:37   US OB LESS THAN 14 WEEKS WITH OB TRANSVAGINAL  Result Date: 11/29/2020 CLINICAL DATA:  First trimester pregnancy, syncopal episode, unable to eat due to hyperemesis; LMP 09/20/2020 EXAM: OBSTETRIC <14 WK [place] AND TRANSVAGINAL OB US TECHNIQUE: Both transabdominal and transvaginal ultrasound examinations were performed for complete evaluation of the gestation as well as the maternal uterus, adnexal regions, and pelvic cul-de-sac. Transvaginal technique was performed to assess early pregnancy. COMPARISON:  None FINDINGS: Intrauterine gestational sac: Present, single Yolk sac:  Present Embryo:  Present Cardiac Activity: Present Heart Rate: 173 bpm CRL: 21.7 mm   8 w   6 d                  [place] EDC: 07/05/2021 Subchorionic hemorrhage:  None visualized. Maternal uterus/adnexae: RIGHT ovary measures 1.9 x 2.1 x 1.8 cm and contains a small corpus luteum. LEFT ovary normal size and morphology, 1.7 x 2.2 x 2.2 cm. No free pelvic fluid or adnexal masses. Remainder of uterus unremarkable. IMPRESSION: Single live intrauterine gestation at 8 weeks 6 days EGA by crown-rump length. No acute abnormalities. Electronically Signed   By: Ulyses Southward M.D.   On: 11/29/2020 16:01    Assessment/Plan: 1. Hyperemesis gravidarum - patient with refractory nausea and vomiting, failed treatments include Phenergan, Unisom and B6.  Given IV Phenergan, Reglan, and 1 dose of IV Zofran (patient ok to try once), barely able to sip water.  Also given D5LR bolus. Did not feel comfortable going home. Admitted for further rehydration and repletion of electrolytes.  Will order Meclizine scheduled (PO) and IV Phenergan and Reglan prn. Also will start banana bag.  2. Syncope - most likely secondary to dehydration as workup has been otherwise negative. Should improve with rehydration.  3. H/o E. Coli UTI, patient notes treatment with liquid Keflex prescription.  Today's UA negative for nitrites. Likely resolution noted.  4. Pregnancy - viable pregnancy noted on recent imaging, dates inconsistent with sono.  EDD now based on sono.  5 DVT prophylaxis - patient able to ambulate as tolerated. Low risk for DVT.  6. Pepcid for GI prophylaxis.    Hildred Laser, MD [facility]

## 2020-11-30 NOTE — Progress Notes (Signed)
Initial Nutrition Assessment  DOCUMENTATION CODES:   Not applicable  INTERVENTION:   -Prenatal MVI daily -Boost Breeze po TID, each supplement provides 250 kcal and 9 grams of protein  NUTRITION DIAGNOSIS:   Inadequate oral intake related to altered GI function,nausea,vomiting as evidenced by per patient/family report.  GOAL:   Patient will meet greater than or equal to 90% of their needs  MONITOR:   PO intake,Supplement acceptance,Diet advancement,Labs,Weight trends,Skin,I & O's  REASON FOR ASSESSMENT:   Malnutrition Screening Tool    ASSESSMENT:   Laura Sosa is an 35 y.o. [id number] female who presented to the Emergency Room yesterday evening with complaints of persistent nausea/vomiting. She is approximately [redacted] weeks pregnant by today's sono, with unsure LMP (thought she might have been closer to 10 or [redacted] weeks pregnant).  She has had 2 prior ED visits for similar complaints over the past several weeks. She has been taking Phenergan and OTC anti-emetics but notes it is not helping.  Has declined use of Zofran this pregnancy due to previous child being born with cleft palate and is concerned of risk for current pregnancy. She has a history of hyperemesis with her previous pregnancies as well. Laura also reports episode of syncopal episode x 1 yesterday, does not think she hit her head.  Pt admitted with hyperemesis gravidarum and syncopal episode, likely related to dehydration.   Reviewed I/O's: -450 ml x 24 hours  Pt sleeping soundly at time of visit. She did not arouse to voice or touch.   Reviewed wt hx; pre-gravid wt around 175#. Wt has been stable over the past 2 weeks.   Pt currently on a clear liquid diet. No meal intake documented currently.   Medications reviewed and include reglan, phenergan, and dextrose 5% in lactated ringers infusion @ 150 ml/hr.   Labs reviewed: Na: 133, CBGS: 101.  NUTRITION - FOCUSED PHYSICAL EXAM:  Flowsheet Row Most Recent  Value  Orbital Region No depletion  Upper Arm Region No depletion  Thoracic and Lumbar Region No depletion  Buccal Region No depletion  Temple Region No depletion  Clavicle Bone Region No depletion  Clavicle and Acromion Bone Region No depletion  Scapular Bone Region No depletion  Dorsal Hand No depletion  Patellar Region No depletion  Anterior Thigh Region No depletion  Posterior Calf Region No depletion  Edema (RD Assessment) None  Hair Reviewed  Eyes Reviewed  Mouth Reviewed  Skin Reviewed  Nails Reviewed       Diet Order:   Diet Order            Diet clear liquid Room service appropriate? Yes; Fluid consistency: Thin  Diet effective now                 EDUCATION NEEDS:   No education needs have been identified at this time  Skin:  Skin Assessment: Reviewed RN Assessment  Last BM:  Unknown  Height:   Ht Readings from Last 1 Encounters:  11/29/20 5\' 3"  (1.6 m)    Weight:   Wt Readings from Last 1 Encounters:  11/29/20 78 kg   BMI:  Body mass index is 30.46 kg/m.  Estimated Nutritional Needs:   Kcal:  1750-1950  Protein:  90-105 grams  Fluid:  > 1.7 L    01/29/21, RD, LDN, CDCES Registered Dietitian II Certified Diabetes Care and Education Specialist Please refer to [facility] for RD and/or RD on-call/weekend/after hours pager

## 2020-11-30 NOTE — Progress Notes (Signed)
  Antenatal Progress Note  Subjective:     Patient ID: Laura Sosa is a 35 y.o. female [redacted]w[redacted]d, Estimated Date of Delivery: 07/05/21 who was admitted for Hyperemesis gravidarum.  HD# 1.   Subjective:  Patient beginning to feel a little better. Is less nauseated. Notes hunger pains. Has been afraid to try anything PO so far. Has not had any further episodes of vomiting since admission.     Review of Systems Denies leakage of fluids, vaginal bleeding.    Objective:   Vitals:   11/30/20 0441 11/30/20 0830 11/30/20 1154 11/30/20 1616  BP: 96/62 99/65 91/62  100/65  Pulse: 64 63 68 77  Resp: 18 20 18 20   Temp: 98.5 F (36.9 C) 98 F (36.7 C) 98.6 F (37 C) 98 F (36.7 C)  TempSrc: Oral Oral    SpO2: 99% 98% 99% 98%  Weight:      Height:        General appearance: alert and no distress Lungs: clear to auscultation bilaterally Heart: regular rate and rhythm, S1, S2 normal, no murmur, click, rub or gallop Abdomen: soft, non-tender; bowel sounds normal; no masses,  no organomegaly Pelvic: deferred Extremities: extremities normal, atraumatic, no cyanosis or edema   Labs:  Lab Results  Component Value Date   WBC 9.5 11/29/2020   HGB 14.6 11/29/2020   HCT 41.2 11/29/2020   MCV 81.7 11/29/2020   PLT 279 11/29/2020    CMP Latest Ref Rng & Units 11/30/2020 11/29/2020 11/17/2020  Glucose 70 - 99 mg/dL 01/29/2021) 76 84  BUN 6 - 20 mg/dL 7 13 12   Creatinine 0.44 - 1.00 mg/dL 11/19/2020 40(C  Sodium 135 - 145 mmol/L 133(L) 131(L) 134(L)  Potassium 3.5 - 5.1 mmol/L 3.6 3.7 4.0  Chloride 98 - 111 mmol/L 104 101 103  CO2 22 - 32 mmol/L 21(L) 17(L) 22  Calcium 8.9 - 10.3 mg/dL 8.3(L) 9.7 9.4  Total Protein 6.5 - 8.1 g/dL - - 8.0  Total Bilirubin 0.3 - 1.2 mg/dL - - 1.0  Alkaline Phos 38 - 126 U/L - - 81  AST 15 - 41 U/L - - 17  ALT 0 - 44 U/L - - 33    Assessment:  35 y.o. female [redacted]w[redacted]d, with:    1. Syncope and collapse   2. Hyperemesis affecting pregnancy, antepartum   3.  Dehydration   4. Electrolyte disturbance     Plan:   1. Syncopal episode x 1 prior to admission, likely due to dehydration.  No further episodes. Cardiac workup negative.  2. Hyperemesis - continue IV anti-emetics. Diet has been advanced to clears, encouraged patient to start slowly with sips and increase as tolerated.  Can also try saltine or graham crackers later this evening if desired.  If tolerating, may consider transition to BRAT diet and transition to PO meds.  3. Dehydration likely resolving with IVF.  4. Electrolyte disturbances improved with IVF, banana bag.    5.63, MD [facility]

## 2020-12-01 ENCOUNTER — Ambulatory Visit: Admission: RE | Admit: 2020-12-01 | Payer: Medicaid Other | Source: Ambulatory Visit

## 2020-12-01 DIAGNOSIS — E878 Other disorders of electrolyte and fluid balance, not elsewhere classified: Secondary | ICD-10-CM

## 2020-12-01 DIAGNOSIS — O210 Mild hyperemesis gravidarum: Secondary | ICD-10-CM | POA: Diagnosis not present

## 2020-12-01 DIAGNOSIS — Z3A09 9 weeks gestation of pregnancy: Secondary | ICD-10-CM

## 2020-12-01 DIAGNOSIS — R55 Syncope and collapse: Secondary | ICD-10-CM | POA: Diagnosis not present

## 2020-12-01 DIAGNOSIS — O26891 Other specified pregnancy related conditions, first trimester: Secondary | ICD-10-CM | POA: Diagnosis not present

## 2020-12-01 MED ORDER — HYDROXYZINE HCL 50 MG/ML IM SOLN
50.0000 mg | Freq: Four times a day (QID) | INTRAMUSCULAR | Status: DC
  Administered 2020-12-01 – 2020-12-02 (×4): 50 mg via INTRAMUSCULAR
  Filled 2020-12-01 (×8): qty 1

## 2020-12-01 MED ORDER — ONDANSETRON HCL 4 MG/2ML IJ SOLN
4.0000 mg | Freq: Four times a day (QID) | INTRAMUSCULAR | Status: DC
  Administered 2020-12-01 – 2020-12-02 (×3): 4 mg via INTRAVENOUS
  Filled 2020-12-01 (×3): qty 2

## 2020-12-01 MED ORDER — LACTATED RINGERS IV BOLUS
1000.0000 mL | Freq: Once | INTRAVENOUS | Status: AC
  Administered 2020-12-01: 1000 mL via INTRAVENOUS

## 2020-12-01 NOTE — Progress Notes (Addendum)
  Antenatal Progress Note  Subjective:     Patient ID: Laura Sosa is a 35 y.o. female [redacted]w[redacted]d, Estimated Date of Delivery: 07/05/21 who was admitted for Hyperemesis gravidarum.  HD# 2.   Subjective:  Patient reports episodes of vomiting after starting on liquid diet overnight.     Review of Systems Denies pelvic cramping, vaginal bleeding.    Objective:   Vitals:   11/30/20 2324 12/01/20 0221 12/01/20 0756 12/01/20 1132  BP: 92/60 97/61 (!) 88/53 101/65  Pulse: (!) 58 66 (!) 56 65  Resp: 20 18 18 18   Temp: 98.5 F (36.9 C) 98.4 F (36.9 C) 97.9 F (36.6 C) 97.7 F (36.5 C)  TempSrc: Oral Oral Oral Oral  SpO2: 100% 100% 100%   Weight:      Height:        General appearance: alert and no distress Lungs: clear to auscultation bilaterally Heart: regular rate and rhythm, S1, S2 normal, no murmur, click, rub or gallop Abdomen: soft, non-tender; bowel sounds normal; no masses,  no organomegaly Pelvic: deferred Extremities: extremities normal, atraumatic, no cyanosis or edema   Labs:  No new labs  Assessment:  35 y.o. female [redacted]w[redacted]d, with:    1. Syncope and collapse   2. Hyperemesis affecting pregnancy, antepartum   3. Dehydration   4. Electrolyte disturbance     Plan:   1. Syncopal episode x 1 prior to admission, likely due to dehydration.  No further episodes. Cardiac workup negative.  2. Hyperemesis - continue IV anti-emetics. Anti-emetics now scheduled regimen. Diet was advanced to clears however patient with episodes of emesis, so diet reduced to NPO except ice chips  3. Dehydration managing with IVF, received another IVF bolus this morning as nurse to reported tea-colored urine this morning.  4. Electrolyte disturbances have improved with IVF, banana bag.    [redacted]w[redacted]d, MD [facility]

## 2020-12-02 DIAGNOSIS — Z3A10 10 weeks gestation of pregnancy: Secondary | ICD-10-CM

## 2020-12-02 MED ORDER — ONDANSETRON 4 MG PO TBDP
4.0000 mg | ORAL_TABLET | Freq: Three times a day (TID) | ORAL | Status: DC
  Administered 2020-12-03 (×2): 4 mg via ORAL
  Filled 2020-12-02 (×2): qty 1

## 2020-12-02 MED ORDER — FAMOTIDINE 40 MG/5ML PO SUSR
20.0000 mg | Freq: Two times a day (BID) | ORAL | Status: DC
  Administered 2020-12-03: 20 mg via ORAL
  Filled 2020-12-02 (×3): qty 2.5

## 2020-12-02 MED ORDER — METOCLOPRAMIDE HCL 5 MG/5ML PO SOLN
10.0000 mg | Freq: Four times a day (QID) | ORAL | Status: DC
  Administered 2020-12-03: 10 mg via ORAL
  Filled 2020-12-02 (×6): qty 10

## 2020-12-02 MED ORDER — ONDANSETRON HCL 4 MG/2ML IJ SOLN
4.0000 mg | Freq: Four times a day (QID) | INTRAMUSCULAR | Status: DC
  Administered 2020-12-02 (×3): 4 mg via INTRAVENOUS
  Filled 2020-12-02 (×3): qty 2

## 2020-12-02 MED ORDER — PROMETHAZINE HCL 6.25 MG/5ML PO SYRP
25.0000 mg | ORAL_SOLUTION | Freq: Four times a day (QID) | ORAL | Status: DC
  Filled 2020-12-02 (×6): qty 20

## 2020-12-02 MED ORDER — METOCLOPRAMIDE HCL 5 MG/ML IJ SOLN
10.0000 mg | Freq: Four times a day (QID) | INTRAMUSCULAR | Status: DC
  Administered 2020-12-02 (×3): 10 mg via INTRAVENOUS
  Filled 2020-12-02 (×3): qty 2

## 2020-12-02 NOTE — Progress Notes (Addendum)
  Antenatal Progress Note  Subjective:     Patient ID: Laura Sosa is a 35 y.o. female [redacted]w[redacted]d, Estimated Date of Delivery: 07/05/21 who was admitted for Hyperemesis gravidarum.  HD# 3.   Subjective:  Patient reports that she tolerated ice chips. No further episodes of nausea.      Review of Systems Denies pelvic cramping, vaginal bleeding.    Objective:   Vitals:   12/01/20 1923 12/02/20 0025 12/02/20 0337 12/02/20 0727  BP: 103/68 102/65 93/63 105/68  Pulse: 69 64 65 62  Resp: 18 18 18 18   Temp: 98.2 F (36.8 C) 97.9 F (36.6 C) 97.7 F (36.5 C) 97.8 F (36.6 C)  TempSrc: Oral Oral Oral Oral  SpO2: 99% 100% 100% 100%  Weight:  73.4 kg    Height:        General appearance: alert and no distress Lungs: clear to auscultation bilaterally Heart: regular rate and rhythm, S1, S2 normal, no murmur, click, rub or gallop Abdomen: soft, non-tender; bowel sounds normal; no masses,  no organomegaly Pelvic: deferred Extremities: extremities normal, atraumatic, no cyanosis or edema   Labs:  No new labs  Assessment:  35 y.o. female [redacted]w[redacted]d, with:    1. Syncope and collapse   2. Hyperemesis affecting pregnancy, antepartum   3. Dehydration   4. Electrolyte disturbance     Plan:   1. Syncopal episode x 1 prior to admission, likely due to dehydration.  No further episodes. Cardiac workup negative.  2. Hyperemesis - continue IV anti-emetics. Diet can be advanced to clears.  3. Dehydration managing with IVF, urine becoming more clear. Urine output remains adequate.  4. Electrolyte disturbances have improved with IVF, banana bag. No further interventions needed.     [redacted]w[redacted]d, MD [facility]

## 2020-12-02 NOTE — Progress Notes (Signed)
Patient beginning to tolerate BRAT diet (dry cereal and crackers).  If she continues to tolerate, will transition to PO meds.    Hildred Laser, MD [facility]

## 2020-12-03 MED ORDER — FAMOTIDINE 40 MG/5ML PO SUSR: 20.0000 mg | Freq: Two times a day (BID) | ORAL | 0 refills | Status: DC

## 2020-12-03 MED ORDER — METOCLOPRAMIDE HCL 5 MG/5ML PO SOLN: 10.0000 mg | Freq: Four times a day (QID) | ORAL | 1 refills | Status: DC | PRN

## 2020-12-03 MED ORDER — PROMETHAZINE HCL 25 MG RE SUPP: 25.0000 mg | Freq: Four times a day (QID) | RECTAL | 0 refills | Status: DC | PRN

## 2020-12-03 MED ORDER — ONDANSETRON 4 MG PO TBDP: 4.0000 mg | ORAL_TABLET | Freq: Three times a day (TID) | ORAL | 1 refills | Status: DC | PRN

## 2020-12-03 MED ORDER — PROMETHAZINE HCL 6.25 MG/5ML PO SYRP: 25.0000 mg | ORAL_SOLUTION | Freq: Four times a day (QID) | ORAL | 1 refills | Status: DC | PRN

## 2020-12-03 MED ORDER — SCOPOLAMINE 1 MG/3DAYS TD PT72: 1.0000 | MEDICATED_PATCH | TRANSDERMAL | 12 refills | Status: DC

## 2020-12-03 MED ORDER — HYDROXYZINE HCL 50 MG PO TABS: 50.0000 mg | ORAL_TABLET | Freq: Four times a day (QID) | ORAL | 1 refills | Status: DC | PRN

## 2020-12-03 NOTE — Discharge Summary (Addendum)
Physician Discharge Summary  Patient ID: Laura Sosa MRN: [id number] DOB/AGE: 33/26/1987 32 y.o.  Admit date: 11/29/2020 Discharge date: 12/03/2020  Admission Diagnoses: Syncope and collapse [R55] Dehydration [E86.0] Hyperemesis gravidarum [O21.0] Hyperemesis [R11.10] Hyperemesis affecting pregnancy, antepartum [O21.0] Vaginal bleeding affecting early pregnancy [O20.9]  Discharge Diagnoses:  Active Problems:   Hyperemesis gravidarum   Hyperemesis affecting pregnancy, antepartum   Discharged Condition: fair  Hospital Course: The patient was admitted from the Emergency Room secondary to hyperemesis, uncontrolled with PO anti-emetics in outpatient setting, dehydrations, and syncopal episode. She underwent evaluation in the ER for syncope with negative findings. While in the ER, she received IVF bolus x 2, and IV anti-emetics but essentially failed PO challenge. She was admitted and treated with scheduled IV anti-emetics (Reglan, Phenergan, Zofran, and Vistaril, scheduled). She also received an IV banana bag on HD#1. By HD#3, she began to tolerate sips of water. By the end of HD#3, she began tolerating crackers and juice and so was switched to PO medications.. By HD#4, she was tolerating a BRAT diet and was able to discharge home.    Consults: None  Significant Diagnostic Studies: labs and radiology: Ultrasound: dating  Results for orders placed or performed during the hospital encounter of 11/29/20  SARS CORONAVIRUS 2 (TAT 6-24 HRS) Nasopharyngeal Nasopharyngeal Swab   Specimen: Nasopharyngeal Swab  Result Value Ref Range   SARS Coronavirus 2 NEGATIVE NEGATIVE  Basic metabolic panel  Result Value Ref Range   Sodium 131 (L) 135 - 145 mmol/L   Potassium 3.7 3.5 - 5.1 mmol/L   Chloride 101 98 - 111 mmol/L   CO2 17 (L) 22 - 32 mmol/L   Glucose, Bld 76 70 - 99 mg/dL   BUN 13 6 - 20 mg/dL   Creatinine, Ser 6.19 0.44 - 1.00 mg/dL   Calcium 9.7 8.9 - 50.9 mg/dL   GFR, Estimated  >32 >67 mL/min   Anion gap 13 5 - 15  CBC  Result Value Ref Range   WBC 9.5 4.0 - 10.5 K/uL   RBC 5.04 3.87 - 5.11 MIL/uL   Hemoglobin 14.6 12.0 - 15.0 g/dL   HCT 12.4 58.0 - 99.8 %   MCV 81.7 80.0 - 100.0 fL   MCH 29.0 26.0 - 34.0 pg   MCHC 35.4 30.0 - 36.0 g/dL   RDW 33.8 25.0 - 53.9 %   Platelets 279 150 - 400 K/uL   nRBC 0.0 0.0 - 0.2 %  Urinalysis, Complete w Microscopic Urine, Clean Catch  Result Value Ref Range   Color, Urine YELLOW (A) YELLOW   APPearance CLEAR (A) CLEAR   Specific Gravity, Urine 1.029 1.005 - 1.030   pH 6.0 5.0 - 8.0   Glucose, UA >=500 (A) NEGATIVE mg/dL   Hgb urine dipstick SMALL (A) NEGATIVE   Bilirubin Urine NEGATIVE NEGATIVE   Ketones, ur 80 (A) NEGATIVE mg/dL   Protein, ur NEGATIVE NEGATIVE mg/dL   Nitrite NEGATIVE NEGATIVE   Leukocytes,Ua NEGATIVE NEGATIVE   RBC / HPF 0-5 0 - 5 RBC/hpf   WBC, UA 0-5 0 - 5 WBC/hpf   Bacteria, UA NONE SEEN NONE SEEN   Squamous Epithelial / LPF 0-5 0 - 5   Mucus PRESENT   Magnesium  Result Value Ref Range   Magnesium 1.8 1.7 - 2.4 mg/dL  hCG, quantitative, pregnancy  Result Value Ref Range   hCG, Beta Chain, Quant, S 238,274 (H) <5 mIU/mL  D-dimer, quantitative  Result Value Ref Range   D-Dimer, Quant 0.27  0.00 - 0.50 ug/mL-FEU  Basic metabolic panel  Result Value Ref Range   Sodium 133 (L) 135 - 145 mmol/L   Potassium 3.6 3.5 - 5.1 mmol/L   Chloride 104 98 - 111 mmol/L   CO2 21 (L) 22 - 32 mmol/L   Glucose, Bld 69 (L) 70 - 99 mg/dL   BUN 7 6 - 20 mg/dL   Creatinine, Ser 3.24 0.44 - 1.00 mg/dL   Calcium 8.3 (L) 8.9 - 10.3 mg/dL   GFR, Estimated >40 >10 mL/min   Anion gap 8 5 - 15  CBG monitoring, ED  Result Value Ref Range   Glucose-Capillary 101 (H) 70 - 99 mg/dL  Type and screen [facility]  Result Value Ref Range   ABO/RH(D) A POS    Antibody Screen NEG    Sample Expiration      12/02/2020,2359 Performed at [facility], [address] [place], [place]  [address]   Troponin I (High Sensitivity)  Result Value Ref Range   Troponin I (High Sensitivity) 3 <18 ng/L     US OB LESS THAN 14 WEEKS WITH OB TRANSVAGINAL CLINICAL DATA:  First trimester pregnancy, syncopal episode, unable to eat due to hyperemesis; LMP 09/20/2020  EXAM: OBSTETRIC <14 WK [place] AND TRANSVAGINAL OB US  TECHNIQUE: Both transabdominal and transvaginal ultrasound examinations were performed for complete evaluation of the gestation as well as the maternal uterus, adnexal regions, and pelvic cul-de-sac. Transvaginal technique was performed to assess early pregnancy.  COMPARISON:  None  FINDINGS: Intrauterine gestational sac: Present, single  Yolk sac:  Present  Embryo:  Present  Cardiac Activity: Present  Heart Rate: 173 bpm  CRL: 21.7 mm   8 w   6 d                  [place] EDC: 07/05/2021  Subchorionic hemorrhage:  None visualized.  Maternal uterus/adnexae:  RIGHT ovary measures 1.9 x 2.1 x 1.8 cm and contains a small corpus luteum.  LEFT ovary normal size and morphology, 1.7 x 2.2 x 2.2 cm.  No free pelvic fluid or adnexal masses.  Remainder of uterus unremarkable.  IMPRESSION: Single live intrauterine gestation at 8 weeks 6 days EGA by crown-rump length.  No acute abnormalities.  Electronically Signed   By: Ulyses Southward M.D.   On: 11/29/2020 16:01 DG Chest 2 View CLINICAL DATA:  Syncope  EXAM: CHEST - 2 VIEW  COMPARISON:  None.  FINDINGS: The heart size and mediastinal contours are within normal limits. Both lungs are clear. The visualized skeletal structures are unremarkable.  IMPRESSION: No active cardiopulmonary disease.  Electronically Signed   By: Maudry Mayhew MD   On: 11/29/2020 14:37   Treatments: IV hydration and anti-emetics  Discharge Exam: Blood pressure (!) 102/59, pulse 64, temperature 97.9 F (36.6 C), temperature source Oral, resp. rate 18, height 5\' 3"  (1.6 m), weight 73.4 kg, SpO2 98 %.  General appearance:  alert and no distress Lungs: clear to auscultation bilaterally Heart: regular rate and rhythm, S1, S2 normal, no murmur, click, rub or gallop Abdomen: soft, non-tender; bowel sounds normal; no masses,  no organomegaly Pelvic: deferred Extremities: extremities normal, atraumatic, no cyanosis or edema  Disposition: Discharge disposition: 01-Home or Self Care       Discharge Instructions    Discharge patient   Complete by: As directed    Can discharge home after 5 pm if remains stable (no nausea/vomiting)   Discharge disposition: 01-Home or Self Care  Discharge patient date: 12/03/2020     Allergies as of 12/03/2020   No Known Allergies     Medication List    STOP taking these medications   promethazine 25 MG tablet Commonly known as: PHENERGAN Replaced by: promethazine 6.25 MG/5ML syrup You also have another medication with the same name that you need to continue taking as instructed.     TAKE these medications   famotidine 40 MG/5ML suspension Commonly known as: PEPCID Take 2.5 mLs (20 mg total) by mouth 2 (two) times daily.   hydrOXYzine 50 MG tablet Commonly known as: ATARAX/VISTARIL Take 1 tablet (50 mg total) by mouth every 6 (six) hours as needed for nausea or vomiting.   metoCLOPramide 5 MG/5ML solution Commonly known as: REGLAN Take 10 mLs (10 mg total) by mouth every 6 (six) hours as needed for nausea.   multivitamin-prenatal 27-0.8 MG Tabs tablet Take 1 tablet by mouth daily at 12 noon.   ondansetron 4 MG disintegrating tablet Commonly known as: ZOFRAN-ODT Take 1 tablet (4 mg total) by mouth every 8 (eight) hours as needed for nausea or vomiting.   promethazine 25 MG suppository Commonly known as: PHENERGAN Place 1 suppository (25 mg total) rectally every 6 (six) hours as needed for nausea or vomiting. What changed:   Another medication with the same name was added. Make sure you understand how and when to take each.  Another medication with the same  name was removed. Continue taking this medication, and follow the directions you see here.   promethazine 6.25 MG/5ML syrup Commonly known as: PHENERGAN Take 20 mLs (25 mg total) by mouth every 6 (six) hours as needed for nausea or vomiting. What changed: You were already taking a medication with the same name, and this prescription was added. Make sure you understand how and when to take each. Replaces: promethazine 25 MG tablet   scopolamine 1 MG/3DAYS Commonly known as: TRANSDERM-SCOP Place 1 patch (1.5 mg total) onto the skin every 3 (three) days.       Follow-up Information    Please schedule follow-up appointment with your obstetrician as soon as you are able to.Hildred Laser, MD. Schedule an appointment as soon as possible for a visit.   Specialties: Obstetrics and Gynecology, Radiology Why: Within 1 week  Contact information: [address] [place] [address] [phone]                A total of 20 minutes were spent face-to-face with the patient during this encounter and over half of that time dealt with counseling and discharge planning.   Signed: Hildred Laser, MD 12/03/2020, 12:12 PM

## 2020-12-03 NOTE — Discharge Instructions (Signed)
Hyperemesis Gravidarum Hyperemesis gravidarum is a severe form of nausea and vomiting that happens during pregnancy. Hyperemesis is worse than morning sickness. It may cause you to have nausea or vomiting all day for many days. It may keep you from eating and drinking enough food and liquids, which can lead to dehydration, malnutrition, and weight loss. Hyperemesis usually occurs during the first half (the first 20 weeks) of pregnancy. It often goes away once a woman is in her second half of pregnancy. However, sometimes hyperemesis continues through an entire pregnancy. What are the causes? The cause of this condition is not known. It may be associated with:  Changes in hormones in the body during pregnancy.  Changes in the gastrointestinal system.  Genetic or inherited conditions. What are the signs or symptoms? Symptoms of this condition include:  Severe nausea and vomiting that does not go away.  Problems keeping food down.  Weight loss.  Loss of body fluid (dehydration).  Loss of appetite. You may have no desire to eat or you may not like the food you have previously enjoyed. How is this diagnosed? This condition may be diagnosed based on your medical history, your symptoms, and a physical exam. You may also have other tests, including:  Blood tests.  Urine tests.  Blood pressure tests.  Ultrasound to look for problems with the placenta or to check if you are pregnant with more than one baby. How is this treated? This condition is managed by controlling symptoms. This may include:  Following an eating plan. This can help to lessen nausea and vomiting.  Treatments that do not use medicine. These include acupressure bracelets, hypnosis, and eating or drinking foods or fluids that contain ginger, ginger ale, or ginger tea.  Taking prescription medicine or over-the-counter medicine as told by your health care provider.  Continuing to take prenatal vitamins. You may need to  change what kind you take and when you take them. Follow your health care provider's instructions about prenatal vitamins. An eating plan and medicines are often used together to help control symptoms. If medicines do not help relieve nausea and vomiting, you may need to receive fluids through an IV at the hospital. Follow these instructions at home: To help relieve your symptoms, listen to your body. Everyone is different and has different preferences. Find what works best for you. Here are some things you can try to help relieve your symptoms: Meals and snacks  Eat 5-6 small meals daily instead of 3 large meals. Eating small meals and snacks can help you avoid an empty stomach.  Before getting out of bed, eat a couple of crackers to avoid moving around on an empty stomach.  Eat a protein-rich snack before bed. Examples include cheese and crackers, or a peanut butter sandwich made with 1 slice of whole-wheat bread and 1 tsp (5 g) of peanut butter.  Eat and drink slowly.  Try eating starchy foods as these are usually tolerated well. Examples include cereal, toast, bread, potatoes, pasta, rice, and pretzels.  Eat at least one serving of protein with your meals and snacks. Protein options include lean meats, poultry, seafood, beans, nuts, nut butters, eggs, cheese, and yogurt.  Eat or suck on things that have ginger in them. It may help to relieve nausea. Add  tsp (0.44 g) ground ginger to hot tea, or choose ginger tea.   Fluids It is important to stay hydrated. Try to:  Drink small amounts of fluids often.  Drink fluids 30 minutes   before or after a meal to help lessen the feeling of a full stomach.  Drink 100% fruit juice or an electrolyte drink. An electrolyte drink contains sodium, potassium, and chloride.  Drink fluids that are cold, clear, and carbonated or sour. These include lemonade, ginger ale, lemon-lime soda, ice water, and sparkling water. Things to avoid Avoid the  following:  Eating foods that trigger your symptoms. These may include spicy foods, coffee, high-fat foods, very sweet foods, and acidic foods.  Drinking more than 1 cup of fluid at a time.  Skipping meals. Nausea can be more intense on an empty stomach. If you cannot tolerate food, do not force it. Try sucking on ice chips or other frozen items and make up for missed calories later.  Lying down within 2 hours after eating.  Being exposed to environmental triggers. These may include food smells, smoky rooms, closed spaces, rooms with strong smells, warm or humid places, overly loud and noisy rooms, and rooms with motion or flickering lights. Try eating meals in a well-ventilated area that is free of strong smells.  Making quick and sudden changes in your movement.  Taking iron pills and multivitamins that contain iron. If you take prescription iron pills, do not stop taking them unless your health care provider approves.  Preparing food. The smell of food can spoil your appetite or trigger nausea. General instructions  Brush your teeth or use a mouth rinse after meals.  Take over-the-counter and prescription medicines only as told by your health care provider.  Follow instructions from your health care provider about eating or drinking restrictions.  Talk with your health care provider about starting a supplement of vitamin B6.  Continue to take your prenatal vitamins as told by your health care provider. If you are having trouble taking your prenatal vitamins, talk with your health care provider about other options.  Keep all follow-up visits. This is important. Follow-up visits include prenatal visits. Contact a health care provider if:  You have pain in your abdomen.  You have a severe headache.  You have vision problems.  You are losing weight.  You feel weak or dizzy.  You cannot eat or drink without vomiting, especially if this goes on for a full day. Get help right  away if:  You cannot drink fluids without vomiting.  You vomit blood.  You have constant nausea and vomiting.  You are very weak.  You faint.  You have a fever and your symptoms suddenly get worse. Summary  Hyperemesis gravidarum is a severe form of nausea and vomiting that happens during pregnancy.  Making some changes to your eating habits may help relieve nausea and vomiting.  This condition may be managed with lifestyle changes and medicines as prescribed by your health care provider.  If medicines do not help relieve nausea and vomiting, you may need to receive fluids through an IV at the hospital. This information is not intended to replace advice given to you by your health care provider. Make sure you discuss any questions you have with your health care provider. Document Revised: 03/07/2020 Document Reviewed: 03/07/2020 Elsevier Patient Education  2021 Elsevier Inc.  

## 2020-12-03 NOTE — Progress Notes (Signed)
Patient discharged, patient requested to walk out versus riding in a wheelchair.  Discharge instructions given, all questions answered.

## 2020-12-03 NOTE — Progress Notes (Signed)
  Antenatal Progress Note  Subjective:     Patient ID: Laura Sosa is a 35 y.o. female [redacted]w[redacted]d, Estimated Date of Delivery: 07/05/21 who was admitted for Hyperemesis gravidarum.  HD# 4.   Subjective:  Patient reports overall tolerating BRAT diet since last night.  She ate eggs and toast this morning. No further episodes of nausea or vomiting.       Review of Systems Denies pelvic cramping, vaginal bleeding.    Objective:   Vitals:   12/02/20 1555 12/02/20 1934 12/02/20 2307 12/03/20 0322  BP: 98/63 107/70 110/76 (!) 102/59  Pulse: 69 72 73 64  Resp: 18 18 18 18   Temp: 98 F (36.7 C) 98.2 F (36.8 C) 97.9 F (36.6 C) 97.9 F (36.6 C)  TempSrc: Oral Oral Oral Oral  SpO2:  100% 100% 98%  Weight:      Height:        General appearance: alert and no distress Lungs: clear to auscultation bilaterally Heart: regular rate and rhythm, S1, S2 normal, no murmur, click, rub or gallop Abdomen: soft, non-tender; bowel sounds normal; no masses,  no organomegaly Pelvic: deferred Extremities: extremities normal, atraumatic, no cyanosis or edema   Labs:  No new labs  Assessment:  35 y.o. female [redacted]w[redacted]d, with:    1. Syncope and collapse   2. Hyperemesis affecting pregnancy, antepartum   3. Dehydration   4. Electrolyte disturbance     Plan:   1. Syncopal episode x 1 prior to admission, likely due to dehydration.  No further episodes. Cardiac workup negative.  2. Hyperemesis - Resolving. Transitioned to PO anti-emetics yesterday evening. Tolerating diet with no further episodes.   3. Dehydration managing with IVF but now tolerating more PO diet.  Will d/c fluids.  4. Electrolyte disturbances have improved with IVF, banana bag on Day #1 of admission. No further interventions needed.     If patient remains stable, can d/c home later this afternoon.    [redacted]w[redacted]d, MD [facility]

## 2020-12-06 ENCOUNTER — Encounter: Payer: Self-pay | Admitting: Physician Assistant

## 2020-12-07 ENCOUNTER — Encounter: Payer: Self-pay | Admitting: Obstetrics and Gynecology

## 2020-12-07 ENCOUNTER — Ambulatory Visit (INDEPENDENT_AMBULATORY_CARE_PROVIDER_SITE_OTHER): Payer: Medicaid Other | Admitting: Obstetrics and Gynecology

## 2020-12-07 ENCOUNTER — Other Ambulatory Visit: Payer: Self-pay

## 2020-12-07 VITALS — BP 130/83 | HR 109 | Ht 63.0 in | Wt 153.0 lb

## 2020-12-07 DIAGNOSIS — Z3A01 Less than 8 weeks gestation of pregnancy: Secondary | ICD-10-CM | POA: Diagnosis not present

## 2020-12-07 DIAGNOSIS — O210 Mild hyperemesis gravidarum: Secondary | ICD-10-CM | POA: Diagnosis not present

## 2020-12-07 MED ORDER — ONDANSETRON HCL 4 MG/2ML IJ SOLN
8.0000 mg | Freq: Once | INTRAMUSCULAR | Status: AC
  Administered 2020-12-07: 8 mg via INTRAMUSCULAR

## 2020-12-07 MED ORDER — PROMETHAZINE HCL 12.5 MG RE SUPP: 12.5000 mg | Freq: Four times a day (QID) | RECTAL | 0 refills | Status: DC | PRN

## 2020-12-07 NOTE — Progress Notes (Signed)
    GYNECOLOGY PROGRESS NOTE  Subjective:    Patient ID: Laura Sosa, female    DOB: Sep 03, 1985, 35 y.o.   MRN: [id number]  HPI  Patient is a 35 y.o. G21P2002 female at [redacted]w[redacted]d weeks gestation who presents for complaints of nausea and vomiting. She reports that after leaving the hospital last weekend, she tried to continue th BRAT diet, however slowly her tolerance to food weakened again. Certain smells are obnoxious.  Reports that she was unable to get her prescription for the suppository secondary to pharmacy informing her that it they did not receive the prescription.    The following portions of the patient's history were reviewed and updated as appropriate: allergies, current medications, past family history, past medical history, past social history, past surgical history and problem list.  Review of Systems Pertinent items noted in HPI and remainder of comprehensive ROS otherwise negative.   Objective:   Blood pressure 130/83, pulse (!) 109, height 5\' 3"  (1.6 m), weight 153 lb (69.4 kg). General appearance: alert and mild distress Abdomen: soft, non-tender; bowel sounds normal; no masses,  no organomegaly Pelvic: external genitalia normal, rectovaginal septum normal.  Vagina without discharge.  Cervix normal appearing, no lesions and no motion tenderness.  Uterus mobile, nontender, normal shape and size.  Adnexae non-palpable, nontender bilaterally.  Extremities: extremities normal, atraumatic, no cyanosis or edema Neurologic: Grossly normal    Labs:  Reviewed in Chart     Assessment:   1. Hyperemesis gravidarum   2. [redacted] weeks gestation of pregnancy    Plan:   - Patient previously on regimen of Zofran, Phenergan, Reglan, Scopolamine patch, and Hydroxyzine  (all taken in rotation).  Given dose of IM Zofran 8 mg today in office, monitored for any side effects.  Advised to return home and begin medication rotation again starting with Reglan. To remain NPO for remainder of  the day except PediaLite popsicles as tolerated..  Lastly, will send in prescription for Phenergan suppositories (did not like Phenergan suppositories as they made her stomach hurt and caused diarrhea).   - To return if symptoms worsen or fail to improve. To return in 1 week for nurse intake visit.    , MD [facility]

## 2020-12-07 NOTE — Patient Instructions (Signed)
Morning Sickness  Morning sickness is when you feel like you may vomit (feel nauseous) during pregnancy. Sometimes, you may vomit. Morning sickness most often happens in the morning, but it can also happen at any time of the day. Some women may have morning sickness that makes them vomit all the time. This is a more serious problem that needs treatment. What are the causes? The cause of this condition is not known. What increases the risk?  You had vomiting or a feeling like you may vomit before your pregnancy.  You had morning sickness in another pregnancy.  You are pregnant with more than one baby, such as twins. What are the signs or symptoms?  Feeling like you may vomit.  Vomiting. How is this treated? Treatment is usually not needed for this condition. You may only need to change what you eat. In some cases, your doctor may give you some things to take for your condition. These include:  Vitamin B6 supplements.  Medicines to treat the feeling that you may vomit.  Ginger. Follow these instructions at home: Medicines  Take over-the-counter and prescription medicines only as told by your doctor. Do not take any medicines until you talk with your doctor about them first.  Take multivitamins before you get pregnant. These can stop or lessen the symptoms of morning sickness. Eating and drinking  Eat dry toast or crackers before getting out of bed.  Eat 5 or 6 small meals a day.  Eat dry and bland foods like rice and baked potatoes.  Do not eat greasy, fatty, or spicy foods.  Have someone cook for you if the smell of food causes you to vomit or to feel like you may vomit.  If you feel like you may vomit after taking prenatal vitamins, take them at night or with a snack.  Eat protein foods when you need a snack. Nuts, yogurt, and cheese are good choices.  Drink fluids throughout the day.  Try ginger ale made with real ginger, ginger tea made from fresh grated ginger, or  ginger candies. General instructions  Do not smoke or use any products that contain nicotine or tobacco. If you need help quitting, ask your doctor.  Use an air purifier to keep the air in your house free of smells.  Get lots of fresh air.  Try to avoid smells that make you feel sick.  Try wearing an acupressure wristband. This is a wristband that is used to treat seasickness.  Try a treatment called acupuncture. In this treatment, a doctor puts needles into certain areas of your body to make you feel better. Contact a doctor if:  You need medicine to feel better.  You feel dizzy or light-headed.  You are losing weight. Get help right away if:  The feeling that you may vomit will not go away, or you cannot stop vomiting.  You faint.  You have very bad pain in your belly. Summary  Morning sickness is when you feel like you may vomit (feel nauseous) during pregnancy.  You may feel sick in the morning, but you can feel this way at any time of the day.  Making some changes to what you eat may help your symptoms go away. This information is not intended to replace advice given to you by your health care provider. Make sure you discuss any questions you have with your health care provider. Document Revised: 03/28/2020 Document Reviewed: 03/07/2020 Elsevier Patient Education  2021 [employer]vier Inc.  

## 2020-12-07 NOTE — Progress Notes (Signed)
Early OB pt present today for nausea, vomiting and diarrhea.

## 2020-12-14 ENCOUNTER — Telehealth: Payer: Self-pay | Admitting: Surgical

## 2020-12-14 NOTE — Telephone Encounter (Signed)
LM or patient to call back to discuss my chart messages.

## 2020-12-21 ENCOUNTER — Encounter: Payer: Self-pay | Admitting: Obstetrics and Gynecology

## 2020-12-22 ENCOUNTER — Ambulatory Visit (INDEPENDENT_AMBULATORY_CARE_PROVIDER_SITE_OTHER): Payer: Medicaid Other | Admitting: Obstetrics and Gynecology

## 2020-12-22 ENCOUNTER — Other Ambulatory Visit: Payer: Self-pay

## 2020-12-22 ENCOUNTER — Encounter: Payer: Self-pay | Admitting: Obstetrics and Gynecology

## 2020-12-22 VITALS — BP 108/70 | HR 117 | Wt 158.5 lb

## 2020-12-22 DIAGNOSIS — Z3491 Encounter for supervision of normal pregnancy, unspecified, first trimester: Secondary | ICD-10-CM | POA: Diagnosis not present

## 2020-12-22 DIAGNOSIS — R1110 Vomiting, unspecified: Secondary | ICD-10-CM

## 2020-12-22 DIAGNOSIS — Z3A12 12 weeks gestation of pregnancy: Secondary | ICD-10-CM

## 2020-12-22 LAB — OB RESULTS CONSOLE VARICELLA ZOSTER ANTIBODY, IGG: Varicella: IMMUNE

## 2020-12-22 LAB — OB RESULTS CONSOLE GC/CHLAMYDIA: Gonorrhea: NEGATIVE

## 2020-12-22 NOTE — Progress Notes (Signed)
NOB: Patient continues to have nausea and vomiting but only vomits 5 times per day now.  Was vomiting 25 times per day.  She continues to take medication and states that the Phenergan suppositories seem to be working the best.  Patient with UTI today.  Advised use of Macrobid.  If patient cannot keep these down we will search for liquid Bactrim. (Liquid Keflex did not work).  Added thyroid to prenatal testing.  Desires MaterniT 21.  Physical examination General NAD, Conversant  HEENT Atraumatic; Op clear with mmm.  Normo-cephalic. Pupils reactive. Anicteric sclerae  Thyroid/Neck Smooth without nodularity or enlargement. Normal ROM.  Neck Supple.  Skin No rashes, lesions or ulceration. Normal palpated skin turgor. No nodularity.  Breasts: No masses or discharge.  Symmetric.  No axillary adenopathy.  Lungs: Clear to auscultation.No rales or wheezes. Normal Respiratory effort, no retractions.  Heart: NSR.  No murmurs or rubs appreciated. No periferal edema  Abdomen: Soft.  Non-tender.  No masses.  No HSM. No hernia  Extremities: Moves all appropriately.  Normal ROM for age. No lymphadenopathy.  Neuro: Oriented to PPT.  Normal mood. Normal affect.     Pelvic:   Vulva: Normal appearance.  No lesions.  Vagina: No lesions or abnormalities noted.  Support: Normal pelvic support.  Urethra No masses tenderness or scarring.  Meatus Normal size without lesions or prolapse.  Cervix: Normal appearance.  No lesions.  Anus: Normal exam.  No lesions.  Perineum: Normal exam.  No lesions.        Bimanual   Adnexae: No masses.  Non-tender to palpation.  Uterus: Enlarged. 163BPM  Non-tender.  Mobile.  AV.  Adnexae: No masses.  Non-tender to palpation.  Cul-de-sac: Negative for abnormality.  Adnexae: No masses.  Non-tender to palpation.         Pelvimetry   Diagonal: Reached.  Spines: Average.  Sacrum: Concave.  Pubic Arch: Normal.

## 2020-12-23 LAB — URINALYSIS, ROUTINE W REFLEX MICROSCOPIC
Bilirubin, UA: NEGATIVE
Glucose, UA: NEGATIVE
Ketones, UA: NEGATIVE
Nitrite, UA: POSITIVE — AB
Specific Gravity, UA: 1.022 (ref 1.005–1.030)
Urobilinogen, Ur: 1 mg/dL (ref 0.2–1.0)
pH, UA: 6 (ref 5.0–7.5)

## 2020-12-23 LAB — MONITOR DRUG PROFILE 14(MW)
Amphetamine Scrn, Ur: NEGATIVE ng/mL
BARBITURATE SCREEN URINE: NEGATIVE ng/mL
BENZODIAZEPINE SCREEN, URINE: NEGATIVE ng/mL
Buprenorphine, Urine: NEGATIVE ng/mL
CANNABINOIDS UR QL SCN: NEGATIVE ng/mL
Cocaine (Metab) Scrn, Ur: NEGATIVE ng/mL
Creatinine(Crt), U: 263.7 mg/dL (ref 20.0–300.0)
Fentanyl, Urine: NEGATIVE pg/mL
Meperidine Screen, Urine: NEGATIVE ng/mL
Methadone Screen, Urine: NEGATIVE ng/mL
OXYCODONE+OXYMORPHONE UR QL SCN: NEGATIVE ng/mL
Opiate Scrn, Ur: NEGATIVE ng/mL
Ph of Urine: 6.8 (ref 4.5–8.9)
Phencyclidine Qn, Ur: NEGATIVE ng/mL
Propoxyphene Scrn, Ur: NEGATIVE ng/mL
SPECIFIC GRAVITY: 1.02
Tramadol Screen, Urine: NEGATIVE ng/mL

## 2020-12-23 LAB — NICOTINE SCREEN, URINE: Cotinine Ql Scrn, Ur: NEGATIVE ng/mL

## 2020-12-23 LAB — MICROSCOPIC EXAMINATION
Casts: NONE SEEN /lpf
RBC, Urine: NONE SEEN /hpf (ref 0–2)

## 2020-12-24 LAB — TSH: TSH: 0.598 u[IU]/mL (ref 0.450–4.500)

## 2020-12-24 LAB — CBC WITH DIFFERENTIAL/PLATELET
Basophils Absolute: 0 10*3/uL (ref 0.0–0.2)
Basos: 1 %
EOS (ABSOLUTE): 0.1 10*3/uL (ref 0.0–0.4)
Eos: 1 %
Hematocrit: 35.8 % (ref 34.0–46.6)
Hemoglobin: 12.4 g/dL (ref 11.1–15.9)
Immature Grans (Abs): 0 10*3/uL (ref 0.0–0.1)
Immature Granulocytes: 1 %
Lymphocytes Absolute: 1.2 10*3/uL (ref 0.7–3.1)
Lymphs: 16 %
MCH: 30 pg (ref 26.6–33.0)
MCHC: 34.6 g/dL (ref 31.5–35.7)
MCV: 87 fL (ref 79–97)
Monocytes Absolute: 0.3 10*3/uL (ref 0.1–0.9)
Monocytes: 4 %
Neutrophils Absolute: 6.1 10*3/uL (ref 1.4–7.0)
Neutrophils: 77 %
Platelets: 237 10*3/uL (ref 150–450)
RBC: 4.14 x10E6/uL (ref 3.77–5.28)
RDW: 13.2 % (ref 11.7–15.4)
WBC: 7.8 10*3/uL (ref 3.4–10.8)

## 2020-12-24 LAB — VARICELLA ZOSTER ANTIBODY, IGG: Varicella zoster IgG: 226 index (ref >165)

## 2020-12-24 LAB — GC/CHLAMYDIA PROBE AMP
Chlamydia trachomatis, NAA: NEGATIVE
Neisseria Gonorrhoeae by PCR: NEGATIVE

## 2020-12-24 LAB — ABO AND RH: Rh Factor: POSITIVE

## 2020-12-24 LAB — RUBELLA SCREEN: Rubella Antibodies, IGG: 0.95 index — ABNORMAL LOW (ref >0.99)

## 2020-12-24 LAB — HIV ANTIBODY (ROUTINE TESTING W REFLEX): HIV Screen 4th Generation wRfx: NONREACTIVE

## 2020-12-24 LAB — HEPATITIS B SURFACE ANTIGEN: Hepatitis B Surface Ag: NEGATIVE

## 2020-12-24 LAB — ANTIBODY SCREEN: Antibody Screen: NEGATIVE

## 2020-12-24 LAB — RPR: RPR Ser Ql: NONREACTIVE

## 2020-12-25 LAB — URINE CULTURE, OB REFLEX

## 2020-12-25 LAB — CULTURE, OB URINE

## 2020-12-29 LAB — MATERNIT21  PLUS CORE+ESS+SCA, BLOOD

## 2021-01-10 ENCOUNTER — Other Ambulatory Visit: Payer: Self-pay | Admitting: Surgical

## 2021-01-10 DIAGNOSIS — N3000 Acute cystitis without hematuria: Secondary | ICD-10-CM

## 2021-01-10 MED ORDER — SULFAMETHOXAZOLE-TRIMETHOPRIM 200-40 MG/5ML PO SUSP: 20.0000 mL | Freq: Two times a day (BID) | ORAL | 0 refills | Status: AC

## 2021-01-10 NOTE — Progress Notes (Unsigned)
Please finish liquid ABX prescription for patient. She can't keep the pills down.

## 2021-01-17 ENCOUNTER — Other Ambulatory Visit: Payer: Self-pay

## 2021-01-17 MED ORDER — ONDANSETRON 4 MG PO TBDP: 4.0000 mg | ORAL_TABLET | Freq: Three times a day (TID) | ORAL | 0 refills | Status: DC | PRN

## 2021-01-20 ENCOUNTER — Other Ambulatory Visit: Payer: Self-pay

## 2021-01-20 ENCOUNTER — Encounter: Payer: Self-pay | Admitting: Obstetrics and Gynecology

## 2021-01-20 ENCOUNTER — Ambulatory Visit (INDEPENDENT_AMBULATORY_CARE_PROVIDER_SITE_OTHER): Payer: Medicaid Other | Admitting: Obstetrics and Gynecology

## 2021-01-20 VITALS — BP 105/72 | HR 86 | Wt 157.7 lb

## 2021-01-20 DIAGNOSIS — R1110 Vomiting, unspecified: Secondary | ICD-10-CM | POA: Diagnosis not present

## 2021-01-20 DIAGNOSIS — Z3A16 16 weeks gestation of pregnancy: Secondary | ICD-10-CM | POA: Diagnosis not present

## 2021-01-20 DIAGNOSIS — Z3482 Encounter for supervision of other normal pregnancy, second trimester: Secondary | ICD-10-CM | POA: Diagnosis not present

## 2021-01-20 DIAGNOSIS — O2342 Unspecified infection of urinary tract in pregnancy, second trimester: Secondary | ICD-10-CM

## 2021-01-20 DIAGNOSIS — O210 Mild hyperemesis gravidarum: Secondary | ICD-10-CM

## 2021-01-20 DIAGNOSIS — Z1379 Encounter for other screening for genetic and chromosomal anomalies: Secondary | ICD-10-CM

## 2021-01-20 LAB — POCT URINALYSIS DIPSTICK OB
Bilirubin, UA: NEGATIVE
Glucose, UA: NEGATIVE
Ketones, UA: NEGATIVE
Leukocytes, UA: NEGATIVE
Nitrite, UA: POSITIVE
Spec Grav, UA: 1.01 (ref 1.010–1.025)
Urobilinogen, UA: 0.2 E.U./dL
pH, UA: 7 (ref 5.0–8.0)

## 2021-01-20 MED ORDER — CEFTRIAXONE SODIUM 1 G IJ SOLR
1.0000 g | INTRAMUSCULAR | Status: DC
  Administered 2021-01-20: 1 g via INTRAMUSCULAR

## 2021-01-20 NOTE — Progress Notes (Signed)
OB-PT present for routine prenatal care at [redacted]w[redacted]d. Pt stated that she was doing well.

## 2021-01-20 NOTE — Patient Instructions (Addendum)
Second Trimester of Pregnancy  The second trimester of pregnancy is from week 13 through week 27. This is months 4 through 6 of pregnancy. The second trimester is often a time when you feel your best. Your body has adjusted to being pregnant, and you begin to feel better physically. During the second trimester:  Morning sickness has lessened or stopped completely.  You may have more energy.  You may have an increase in appetite. The second trimester is also a time when the unborn baby (fetus) is growing rapidly. At the end of the sixth month, the fetus may be up to 12 inches long and weigh about 1 pounds. You will likely begin to feel the baby move (quickening) between 16 and 20 weeks of pregnancy. Body changes during your second trimester Your body continues to go through many changes during your second trimester. The changes vary and generally return to normal after the baby is born. Physical changes  Your weight will continue to increase. You will notice your lower abdomen bulging out.  You may begin to get stretch marks on your hips, abdomen, and breasts.  Your breasts will continue to grow and to become tender.  Dark spots or blotches (chloasma or mask of pregnancy) may develop on your face.  A dark line from your belly button to the pubic area (linea nigra) may appear.  You may have changes in your hair. These can include thickening of your hair, rapid growth, and changes in texture. Some people also have hair loss during or after pregnancy, or hair that feels dry or thin. Health changes  You may develop headaches.  You may have heartburn.  You may develop constipation.  You may develop hemorrhoids or swollen, bulging veins (varicose veins).  Your gums may bleed and may be sensitive to brushing and flossing.  You may urinate more often because the fetus is pressing on your bladder.  You may have back pain. This is caused by: ? Weight gain. ? Pregnancy hormones that  are relaxing the joints in your pelvis. ? A shift in weight and the muscles that support your balance. Follow these instructions at home: Medicines  Follow your health care provider's instructions regarding medicine use. Specific medicines may be either safe or unsafe to take during pregnancy. Do not take any medicines unless approved by your health care provider.  Take a prenatal vitamin that contains at least 600 micrograms (mcg) of folic acid. Eating and drinking  Eat a healthy diet that includes fresh fruits and vegetables, whole grains, good sources of protein such as meat, eggs, or tofu, and low-fat dairy products.  Avoid raw meat and unpasteurized juice, milk, and cheese. These carry germs that can harm you and your baby.  You may need to take these actions to prevent or treat constipation: ? Drink enough fluid to keep your urine pale yellow. ? Eat foods that are high in fiber, such as beans, whole grains, and fresh fruits and vegetables. ? Limit foods that are high in fat and processed sugars, such as fried or sweet foods. Activity  Exercise only as directed by your health care provider. Most people can continue their usual exercise routine during pregnancy. Try to exercise for 30 minutes at least 5 days a week. Stop exercising if you develop contractions in your uterus.  Stop exercising if you develop pain or cramping in the lower abdomen or lower back.  Avoid exercising if it is very hot or humid or if you are at   a high altitude.  Avoid heavy lifting.  If you choose to, you may have sex unless your health care provider tells you not to. Relieving pain and discomfort  Wear a supportive bra to prevent discomfort from breast tenderness.  Take warm sitz baths to soothe any pain or discomfort caused by hemorrhoids. Use hemorrhoid cream if your health care provider approves.  Rest with your legs raised (elevated) if you have leg cramps or low back pain.  If you develop  varicose veins: ? Wear support hose as told by your health care provider. ? Elevate your feet for 15 minutes, 3-4 times a day. ? Limit salt in your diet. Safety  Wear your seat belt at all times when driving or riding in a car.  Talk with your health care provider if someone is verbally or physically abusive to you. Lifestyle  Do not use hot tubs, steam rooms, or saunas.  Do not douche. Do not use tampons or scented sanitary pads.  Avoid cat litter boxes and soil used by cats. These carry germs that can cause birth defects in the baby and possibly loss of the fetus by miscarriage or stillbirth.  Do not use herbal remedies, alcohol, illegal drugs, or medicines that are not approved by your health care provider. Chemicals in these products can harm your baby.  Do not use any products that contain nicotine or tobacco, such as cigarettes, e-cigarettes, and chewing tobacco. If you need help quitting, ask your health care provider. General instructions  During a routine prenatal visit, your health care provider will do a physical exam and other tests. He or she will also discuss your overall health. Keep all follow-up visits. This is important.  Ask your health care provider for a referral to a local prenatal education class.  Ask for help if you have counseling or nutritional needs during pregnancy. Your health care provider can offer advice or refer you to specialists for help with various needs. Where to find more information  American Pregnancy Association: americanpregnancy.org  American College of Obstetricians and Gynecologists: acog.org/en/Womens%20Health/Pregnancy  Office on Women's Health: womenshealth.gov/pregnancy Contact a health care provider if you have:  A headache that does not go away when you take medicine.  Vision changes or you see spots in front of your eyes.  Mild pelvic cramps, pelvic pressure, or nagging pain in the abdominal area.  Persistent nausea,  vomiting, or diarrhea.  A bad-smelling vaginal discharge or foul-smelling urine.  Pain when you urinate.  Sudden or extreme swelling of your face, hands, ankles, feet, or legs.  A fever. Get help right away if you:  Have fluid leaking from your vagina.  Have spotting or bleeding from your vagina.  Have severe abdominal cramping or pain.  Have difficulty breathing.  Have chest pain.  Have fainting spells.  Have not felt your baby move for the time period told by your health care provider.  Have new or increased pain, swelling, or redness in an arm or leg. Summary  The second trimester of pregnancy is from week 13 through week 27 (months 4 through 6).  Do not use herbal remedies, alcohol, illegal drugs, or medicines that are not approved by your health care provider. Chemicals in these products can harm your baby.  Exercise only as directed by your health care provider. Most people can continue their usual exercise routine during pregnancy.  Keep all follow-up visits. This is important. This information is not intended to replace advice given to you by your   health care provider. Make sure you discuss any questions you have with your health care provider. Document Revised: 01/20/2020 Document Reviewed: 11/26/2019 Elsevier Patient Education  2021 Elsevier Inc.     Second Trimester of Pregnancy  The second trimester of pregnancy is from week 13 through week 27. This is months 4 through 6 of pregnancy. The second trimester is often a time when you feel your best. Your body has adjusted to being pregnant, and you begin to feel better physically. During the second trimester:  Morning sickness has lessened or stopped completely.  You may have more energy.  You may have an increase in appetite. The second trimester is also a time when the unborn baby (fetus) is growing rapidly. At the end of the sixth month, the fetus may be up to 12 inches long and weigh about 1 pounds.  You will likely begin to feel the baby move (quickening) between 16 and 20 weeks of pregnancy. Body changes during your second trimester Your body continues to go through many changes during your second trimester. The changes vary and generally return to normal after the baby is born. Physical changes  Your weight will continue to increase. You will notice your lower abdomen bulging out.  You may begin to get stretch marks on your hips, abdomen, and breasts.  Your breasts will continue to grow and to become tender.  Dark spots or blotches (chloasma or mask of pregnancy) may develop on your face.  A dark line from your belly button to the pubic area (linea nigra) may appear.  You may have changes in your hair. These can include thickening of your hair, rapid growth, and changes in texture. Some people also have hair loss during or after pregnancy, or hair that feels dry or thin. Health changes  You may develop headaches.  You may have heartburn.  You may develop constipation.  You may develop hemorrhoids or swollen, bulging veins (varicose veins).  Your gums may bleed and may be sensitive to brushing and flossing.  You may urinate more often because the fetus is pressing on your bladder.  You may have back pain. This is caused by: ? Weight gain. ? Pregnancy hormones that are relaxing the joints in your pelvis. ? A shift in weight and the muscles that support your balance. Follow these instructions at home: Medicines  Follow your health care provider's instructions regarding medicine use. Specific medicines may be either safe or unsafe to take during pregnancy. Do not take any medicines unless approved by your health care provider.  Take a prenatal vitamin that contains at least 600 micrograms (mcg) of folic acid. Eating and drinking  Eat a healthy diet that includes fresh fruits and vegetables, whole grains, good sources of protein such as meat, eggs, or tofu, and low-fat  dairy products.  Avoid raw meat and unpasteurized juice, milk, and cheese. These carry germs that can harm you and your baby.  You may need to take these actions to prevent or treat constipation: ? Drink enough fluid to keep your urine pale yellow. ? Eat foods that are high in fiber, such as beans, whole grains, and fresh fruits and vegetables. ? Limit foods that are high in fat and processed sugars, such as fried or sweet foods. Activity  Exercise only as directed by your health care provider. Most people can continue their usual exercise routine during pregnancy. Try to exercise for 30 minutes at least 5 days a week. Stop exercising if you develop contractions  in your uterus.  Stop exercising if you develop pain or cramping in the lower abdomen or lower back.  Avoid exercising if it is very hot or humid or if you are at a high altitude.  Avoid heavy lifting.  If you choose to, you may have sex unless your health care provider tells you not to. Relieving pain and discomfort  Wear a supportive bra to prevent discomfort from breast tenderness.  Take warm sitz baths to soothe any pain or discomfort caused by hemorrhoids. Use hemorrhoid cream if your health care provider approves.  Rest with your legs raised (elevated) if you have leg cramps or low back pain.  If you develop varicose veins: ? Wear support hose as told by your health care provider. ? Elevate your feet for 15 minutes, 3-4 times a day. ? Limit salt in your diet. Safety  Wear your seat belt at all times when driving or riding in a car.  Talk with your health care provider if someone is verbally or physically abusive to you. Lifestyle  Do not use hot tubs, steam rooms, or saunas.  Do not douche. Do not use tampons or scented sanitary pads.  Avoid cat litter boxes and soil used by cats. These carry germs that can cause birth defects in the baby and possibly loss of the fetus by miscarriage or stillbirth.  Do not  use herbal remedies, alcohol, illegal drugs, or medicines that are not approved by your health care provider. Chemicals in these products can harm your baby.  Do not use any products that contain nicotine or tobacco, such as cigarettes, e-cigarettes, and chewing tobacco. If you need help quitting, ask your health care provider. General instructions  During a routine prenatal visit, your health care provider will do a physical exam and other tests. He or she will also discuss your overall health. Keep all follow-up visits. This is important.  Ask your health care provider for a referral to a local prenatal education class.  Ask for help if you have counseling or nutritional needs during pregnancy. Your health care provider can offer advice or refer you to specialists for help with various needs. Where to find more information  American Pregnancy Association: americanpregnancy.org  [employer] Gynecologists: https://www.todd-brady.net/  Office on [employer] Health: MightyReward.co.nz Contact a health care provider if you have:  A headache that does not go away when you take medicine.  Vision changes or you see spots in front of your eyes.  Mild pelvic cramps, pelvic pressure, or nagging pain in the abdominal area.  Persistent nausea, vomiting, or diarrhea.  A bad-smelling vaginal discharge or foul-smelling urine.  Pain when you urinate.  Sudden or extreme swelling of your face, hands, ankles, feet, or legs.  A fever. Get help right away if you:  Have fluid leaking from your vagina.  Have spotting or bleeding from your vagina.  Have severe abdominal cramping or pain.  Have difficulty breathing.  Have chest pain.  Have fainting spells.  Have not felt your baby move for the time period told by your health care provider.  Have new or increased pain, swelling, or redness in an arm or leg. Summary  The second trimester of pregnancy  is from week 13 through week 27 (months 4 through 6).  Do not use herbal remedies, alcohol, illegal drugs, or medicines that are not approved by your health care provider. Chemicals in these products can harm your baby.  Exercise only as directed by your  health care provider. Most people can continue their usual exercise routine during pregnancy.  Keep all follow-up visits. This is important. This information is not intended to replace advice given to you by your health care provider. Make sure you discuss any questions you have with your health care provider. Document Revised: 01/20/2020 Document Reviewed: 11/26/2019 Elsevier Patient Education  2021 [employer].

## 2021-01-20 NOTE — Progress Notes (Signed)
Still noting nausea and vomiting, mostly in the morning. Less frequent occurrences than in first trimester.  Notes some difficulty at times with keeping liquid antibiotics down for UTI. Urine quite malodorous today. Will also give dose of Rocephin to treat UTI. Normal MaterniT21, for AFP today. RTC in 4 weeks, for anatomy scan then.

## 2021-01-26 LAB — URINE CULTURE

## 2021-02-13 NOTE — Telephone Encounter (Signed)
Please help with the message. Thanks [employer]

## 2021-02-14 ENCOUNTER — Encounter: Payer: Medicaid Other | Admitting: Obstetrics and Gynecology

## 2021-02-15 ENCOUNTER — Other Ambulatory Visit: Payer: Self-pay | Admitting: Obstetrics and Gynecology

## 2021-02-15 DIAGNOSIS — Z3689 Encounter for other specified antenatal screening: Secondary | ICD-10-CM

## 2021-02-15 DIAGNOSIS — Z3482 Encounter for supervision of other normal pregnancy, second trimester: Secondary | ICD-10-CM

## 2021-02-16 ENCOUNTER — Other Ambulatory Visit: Payer: Medicaid Other

## 2021-02-16 ENCOUNTER — Ambulatory Visit: Payer: Medicaid Other

## 2021-02-16 ENCOUNTER — Encounter: Payer: Self-pay | Admitting: Obstetrics and Gynecology

## 2021-02-25 ENCOUNTER — Encounter: Payer: Self-pay | Admitting: Obstetrics and Gynecology

## 2021-02-25 ENCOUNTER — Observation Stay: Payer: Medicaid Other

## 2021-02-25 ENCOUNTER — Inpatient Hospital Stay
Admission: EM | Admit: 2021-02-25 | Discharge: 2021-03-02 | DRG: 818 | Disposition: A | Discharge status: Home / Self Care | Payer: Medicaid Other | Attending: Obstetrics and Gynecology | Admitting: Obstetrics and Gynecology

## 2021-02-25 DIAGNOSIS — O2302 Infections of kidney in pregnancy, second trimester: Secondary | ICD-10-CM | POA: Diagnosis present

## 2021-02-25 DIAGNOSIS — B9620 Unspecified Escherichia coli [E. coli] as the cause of diseases classified elsewhere: Secondary | ICD-10-CM | POA: Diagnosis present

## 2021-02-25 DIAGNOSIS — Z87448 Personal history of other diseases of urinary system: Secondary | ICD-10-CM | POA: Diagnosis present

## 2021-02-25 DIAGNOSIS — Z3689 Encounter for other specified antenatal screening: Secondary | ICD-10-CM

## 2021-02-25 DIAGNOSIS — N200 Calculus of kidney: Secondary | ICD-10-CM

## 2021-02-25 DIAGNOSIS — O210 Mild hyperemesis gravidarum: Secondary | ICD-10-CM | POA: Diagnosis present

## 2021-02-25 DIAGNOSIS — N136 Pyonephrosis: Secondary | ICD-10-CM | POA: Diagnosis present

## 2021-02-25 DIAGNOSIS — Z87442 Personal history of urinary calculi: Secondary | ICD-10-CM | POA: Diagnosis present

## 2021-02-25 DIAGNOSIS — Z3A21 21 weeks gestation of pregnancy: Secondary | ICD-10-CM

## 2021-02-25 DIAGNOSIS — R109 Unspecified abdominal pain: Secondary | ICD-10-CM

## 2021-02-25 DIAGNOSIS — N202 Calculus of kidney with calculus of ureter: Secondary | ICD-10-CM | POA: Diagnosis present

## 2021-02-25 DIAGNOSIS — Z3490 Encounter for supervision of normal pregnancy, unspecified, unspecified trimester: Secondary | ICD-10-CM

## 2021-02-25 DIAGNOSIS — O26832 Pregnancy related renal disease, second trimester: Secondary | ICD-10-CM | POA: Diagnosis not present

## 2021-02-25 DIAGNOSIS — N201 Calculus of ureter: Secondary | ICD-10-CM | POA: Diagnosis not present

## 2021-02-25 DIAGNOSIS — N1330 Unspecified hydronephrosis: Secondary | ICD-10-CM | POA: Diagnosis not present

## 2021-02-25 DIAGNOSIS — K5909 Other constipation: Secondary | ICD-10-CM | POA: Diagnosis present

## 2021-02-25 DIAGNOSIS — O99891 Other specified diseases and conditions complicating pregnancy: Principal | ICD-10-CM | POA: Diagnosis present

## 2021-02-25 DIAGNOSIS — Z20822 Contact with and (suspected) exposure to covid-19: Secondary | ICD-10-CM | POA: Diagnosis present

## 2021-02-25 LAB — CBC
HCT: 34.2 % — ABNORMAL LOW (ref 36.0–46.0)
Hemoglobin: 12.1 g/dL (ref 12.0–15.0)
MCH: 32 pg (ref 26.0–34.0)
MCHC: 35.4 g/dL (ref 30.0–36.0)
MCV: 90.5 fL (ref 80.0–100.0)
Platelets: 256 10*3/uL (ref 150–400)
RBC: 3.78 MIL/uL — ABNORMAL LOW (ref 3.87–5.11)
RDW: 13.1 % (ref 11.5–15.5)
WBC: 22.3 10*3/uL — ABNORMAL HIGH (ref 4.0–10.5)
nRBC: 0 % (ref 0.0–0.2)

## 2021-02-25 LAB — URINALYSIS, ROUTINE W REFLEX MICROSCOPIC
Bilirubin Urine: NEGATIVE
Glucose, UA: NEGATIVE mg/dL
Ketones, ur: 20 mg/dL — AB
Nitrite: NEGATIVE
Protein, ur: 30 mg/dL — AB
Specific Gravity, Urine: 1.021 (ref 1.005–1.030)
WBC, UA: >50 WBC/hpf — ABNORMAL HIGH (ref 0–5)
pH: 6 (ref 5.0–8.0)

## 2021-02-25 LAB — RESP PANEL BY RT-PCR (FLU A&B, COVID) ARPGX2
Influenza A by PCR: NEGATIVE
Influenza B by PCR: NEGATIVE
SARS Coronavirus 2 by RT PCR: NEGATIVE

## 2021-02-25 LAB — GLUCOSE, CAPILLARY: Glucose-Capillary: 101 mg/dL — ABNORMAL HIGH (ref 70–99)

## 2021-02-25 MED ORDER — ACETAMINOPHEN-CODEINE #3 300-30 MG PO TABS
2.0000 | ORAL_TABLET | Freq: Once | ORAL | Status: AC
  Administered 2021-02-25: 2 via ORAL
  Filled 2021-02-25: qty 2

## 2021-02-25 MED ORDER — PROMETHAZINE HCL 25 MG/ML IJ SOLN: 25.0000 mg | Freq: Four times a day (QID) | INTRAMUSCULAR | Status: DC | PRN

## 2021-02-25 MED ORDER — TAMSULOSIN HCL 0.4 MG PO CAPS
0.4000 mg | ORAL_CAPSULE | Freq: Every day | ORAL | Status: DC
  Administered 2021-02-25 – 2021-03-01 (×5): 0.4 mg via ORAL
  Filled 2021-02-25 (×6): qty 1

## 2021-02-25 MED ORDER — ONDANSETRON HCL 4 MG/2ML IJ SOLN
INTRAMUSCULAR | Status: AC
  Administered 2021-02-25: 4 mg via INTRAVENOUS
  Filled 2021-02-25: qty 2

## 2021-02-25 MED ORDER — BUTORPHANOL TARTRATE 1 MG/ML IJ SOLN
1.0000 mg | Freq: Once | INTRAMUSCULAR | Status: AC
Start: 2021-02-25 — End: 2021-02-25

## 2021-02-25 MED ORDER — BUTORPHANOL TARTRATE 1 MG/ML IJ SOLN
INTRAMUSCULAR | Status: AC
  Administered 2021-02-25: 1 mg
  Filled 2021-02-25: qty 1

## 2021-02-25 MED ORDER — ONDANSETRON HCL 4 MG/2ML IJ SOLN
4.0000 mg | Freq: Four times a day (QID) | INTRAMUSCULAR | Status: DC | PRN
  Administered 2021-02-26 – 2021-03-02 (×14): 4 mg via INTRAVENOUS
  Filled 2021-02-25 (×15): qty 2

## 2021-02-25 MED ORDER — PROMETHAZINE HCL 25 MG/ML IJ SOLN: 25.0000 mg | Freq: Once | INTRAMUSCULAR | Status: DC

## 2021-02-25 MED ORDER — MEPERIDINE HCL 25 MG/ML IJ SOLN
75.0000 mg | Freq: Four times a day (QID) | INTRAMUSCULAR | Status: DC | PRN
  Filled 2021-02-25: qty 3

## 2021-02-25 MED ORDER — MEPERIDINE HCL 25 MG/ML IJ SOLN: 75.0000 mg | Freq: Once | INTRAMUSCULAR | Status: DC

## 2021-02-25 MED ORDER — MEPERIDINE HCL 25 MG/ML IJ SOLN
75.0000 mg | Freq: Four times a day (QID) | INTRAMUSCULAR | Status: DC | PRN
  Administered 2021-02-25: 75 mg via INTRAMUSCULAR

## 2021-02-25 MED ORDER — PROMETHAZINE HCL 25 MG/ML IJ SOLN
25.0000 mg | Freq: Four times a day (QID) | INTRAMUSCULAR | Status: DC | PRN
  Administered 2021-02-25: 25 mg via INTRAMUSCULAR
  Filled 2021-02-25: qty 1

## 2021-02-25 MED ORDER — MEPERIDINE HCL 25 MG/ML IJ SOLN: 75.0000 mg | Freq: Four times a day (QID) | INTRAMUSCULAR | Status: DC | PRN

## 2021-02-25 MED ORDER — SODIUM CHLORIDE 0.9 % IV SOLN
12.5000 mg | Freq: Once | INTRAVENOUS | Status: AC
  Administered 2021-02-25: 12.5 mg via INTRAVENOUS
  Filled 2021-02-25: qty 0.5

## 2021-02-25 MED ORDER — ACETAMINOPHEN-CODEINE #3 300-30 MG PO TABS
ORAL_TABLET | ORAL | Status: AC
  Filled 2021-02-25: qty 2

## 2021-02-25 MED ORDER — LACTATED RINGERS IV SOLN: INTRAVENOUS | Status: DC

## 2021-02-25 NOTE — H&P (Signed)
ADMIT NOTE  HPI:      Laura Sosa is a 35 y.o. [id number] who LMP was No LMP recorded (lmp unknown). Patient is pregnant.  Subjective: She presents today with complaint of left-sided pelvic and flank pain.  EGA = 21 weeks. H/O E-Coli UTI during this pregnancy.           HISTORY No Known Allergies  OB History  OB History  Gravida Para Term Preterm AB Living  3 2 2     2   SAB IAB Ectopic Multiple Live Births        0 2    # Outcome Date GA Lbr Len/2nd Weight Sex Delivery Anes PTL Lv  3 Current           2 Term 06/17/18 [redacted]w[redacted]d / 00:29 2780 g M Vag-Spont EPI  LIV  1 Term 2011   3799 g F Vag-Spont   LIV    Obstetric Comments  G1 - diagnosed with cleft palate at age 39    Past Medical History  Past Medical History:  Diagnosis Date   Headache    History of kidney stones    Migraine    Pyelonephritis 04/17/2016    Past Surgical History  Past Surgical History:  Procedure Laterality Date   COLPOSCOPY     CYSTOSCOPY/RETROGRADE/URETEROSCOPY  03/15/2017   Procedure: CYSTOSCOPY/RETROGRADE/URETEROSCOPY;  Surgeon: 03/17/2017, MD;  Location: [facility] ORS;  Service: Urology;;   LEEP        Past Social History:  Social History   Socioeconomic History   Marital status: Single    Spouse name: Not on file   Number of children: Not on file   Years of education: Not on file   Highest education level: Not on file  Occupational History   Not on file  Tobacco Use   Smoking status: Never   Smokeless tobacco: Never  Vaping Use   Vaping Use: Never used  Substance and Sexual Activity   Alcohol use: No   Drug use: No   Sexual activity: Yes  Other Topics Concern   Not on file  Social History Narrative   Not on file   Social Determinants of Health   Financial Resource Strain: Not on file  Food Insecurity: Not on file  Transportation Needs: Not on file  Physical Activity: Not on file  Stress: Not on file  Social Connections: Not on file    Family  History  Family History  Problem Relation Age of Onset   Hypertension Mother    Diabetes Mother    Diabetes Father    Hypertension Father    Emphysema Maternal Grandmother    Lung cancer Maternal Grandmother    Asthma Daughter    Hypertension Paternal Grandmother    Heart defect Half-Sister        "boot shaped heart"   Kidney cancer Neg Hx    Bladder Cancer Neg Hx    Breast cancer Neg Hx    Ovarian cancer Neg Hx    Colon cancer Neg Hx      ROS: Constitutional: Denied constitutional symptoms, night sweats, recent illness, fatigue, fever, insomnia and weight loss.  Eyes: Denied eye symptoms, eye pain, photophobia, vision change and visual disturbance.  Ears/Nose/Throat/Neck: Denied ear, nose, throat or neck symptoms, hearing loss, nasal discharge, sinus congestion and sore throat.  Cardiovascular: Denied cardiovascular symptoms, arrhythmia, chest pain/pressure, edema, exercise intolerance, orthopnea and palpitations.  Respiratory: Denied pulmonary symptoms, asthma,  pleuritic pain, productive sputum, cough, dyspnea and wheezing.  Gastrointestinal: Denied, gastro-esophageal reflux, melena, nausea and vomiting.  Genitourinary: Denied genitourinary symptoms including symptomatic vaginal discharge, pelvic relaxation issues, and urinary complaints.  Musculoskeletal: Denied musculoskeletal symptoms, stiffness, swelling, muscle weakness and myalgia.  Dermatologic: Denied dermatology symptoms, rash and scar.  Neurologic: Denied neurology symptoms, dizziness, headache, neck pain and syncope.  Psychiatric: Denied psychiatric symptoms, anxiety and depression.  Endocrine: Denied endocrine symptoms including hot flashes and night sweats.   Medications    Current Discharge Medication List     CONTINUE these medications which have NOT CHANGED   Details  famotidine (PEPCID) 40 MG/5ML suspension Take 2.5 mLs (20 mg total) by mouth 2 (two) times daily. Qty: 50 mL, Refills: 0    hydrOXYzine  (ATARAX/VISTARIL) 50 MG tablet Take 1 tablet (50 mg total) by mouth every 6 (six) hours as needed for nausea or vomiting. Qty: 30 tablet, Refills: 1    metoCLOPramide (REGLAN) 5 MG/5ML solution Take 10 mLs (10 mg total) by mouth every 6 (six) hours as needed for nausea. Qty: 120 mL, Refills: 1    ondansetron (ZOFRAN-ODT) 4 MG disintegrating tablet Take 1 tablet (4 mg total) by mouth every 8 (eight) hours as needed for nausea or vomiting. Qty: 60 tablet, Refills: 0    Prenatal Vit-Fe Fumarate-FA (MULTIVITAMIN-PRENATAL) 27-0.8 MG TABS tablet Take 1 tablet by mouth daily at 12 noon.    promethazine (PHENERGAN) 6.25 MG/5ML syrup Take 20 mLs (25 mg total) by mouth every 6 (six) hours as needed for nausea or vomiting. Qty: 120 mL, Refills: 1    scopolamine (TRANSDERM-SCOP) 1 MG/3DAYS Place 1 patch (1.5 mg total) onto the skin every 3 (three) days. Qty: 10 patch, Refills: 12         Objective: Vitals:   02/25/21 1743 02/25/21 1920  BP: 117/66 116/66  Pulse: 89 (!) 105  Resp: 16 18  Temp: 99.1 F (37.3 C) 98.2 F (36.8 C)      HEENT: Grossly within normal limits.  Normo-cephalic.  Neck Supple.  Pupils reactive.  Thyroid Smooth without nodularity or enlargement.  Skin No rashes, lesions or ulceration     Lungs: Clear to auscultation.  No rales or wheezes.  Heart: NSR.  No murmurs or rubs appreciated.  Abdomen: Soft.  Non-tender.  No masses.  No HSM.  Extremities: Moves all appropriately.  Normal ROM for age.  Neuro: Oriented to PPT.  Normal mood.           FHT's by doppler 162  Renal US :  IMPRESSION: 1. Mild to moderate LEFT hydronephrosis of uncertain etiology and may be related to infection, obstruction or pregnancy. 2. LEFT nephrolithiasis. A 7 mm calculus within the LEFT renal pelvis lies near the UPJ. 3. Mild RIGHT hydronephrosis more likely related to pregnancy.    ASSESSMENT:  1.  Flank pain - hematuria - neg nitirites and sm bacteria - US shows 2 left-sided  renal calculi but higher in renal pelvis, also so left hydronephrosis   PLAN: 1.  IV hydration, strain urine, pain medication, flomax  I spent greater than 70 minutes in the direct care of this patient today.  Brennan Bailey ,MD 02/25/2021,8:02 PM

## 2021-02-26 ENCOUNTER — Encounter
Admission: EM | Disposition: A | Discharge status: Home / Self Care | Payer: Self-pay | Source: Home / Self Care | Attending: Obstetrics and Gynecology

## 2021-02-26 ENCOUNTER — Observation Stay: Payer: Medicaid Other | Admitting: Anesthesiology

## 2021-02-26 ENCOUNTER — Observation Stay: Payer: Medicaid Other

## 2021-02-26 ENCOUNTER — Encounter: Payer: Self-pay | Admitting: Obstetrics and Gynecology

## 2021-02-26 DIAGNOSIS — Z3A21 21 weeks gestation of pregnancy: Secondary | ICD-10-CM | POA: Diagnosis not present

## 2021-02-26 DIAGNOSIS — Z960 Presence of urogenital implants: Secondary | ICD-10-CM

## 2021-02-26 DIAGNOSIS — K5909 Other constipation: Secondary | ICD-10-CM | POA: Diagnosis not present

## 2021-02-26 DIAGNOSIS — O2342 Unspecified infection of urinary tract in pregnancy, second trimester: Secondary | ICD-10-CM | POA: Diagnosis not present

## 2021-02-26 DIAGNOSIS — O26832 Pregnancy related renal disease, second trimester: Secondary | ICD-10-CM | POA: Diagnosis not present

## 2021-02-26 DIAGNOSIS — O2302 Infections of kidney in pregnancy, second trimester: Secondary | ICD-10-CM | POA: Diagnosis not present

## 2021-02-26 DIAGNOSIS — N200 Calculus of kidney: Secondary | ICD-10-CM | POA: Diagnosis not present

## 2021-02-26 DIAGNOSIS — N202 Calculus of kidney with calculus of ureter: Secondary | ICD-10-CM | POA: Diagnosis not present

## 2021-02-26 DIAGNOSIS — N390 Urinary tract infection, site not specified: Secondary | ICD-10-CM | POA: Diagnosis not present

## 2021-02-26 DIAGNOSIS — O210 Mild hyperemesis gravidarum: Secondary | ICD-10-CM | POA: Diagnosis not present

## 2021-02-26 DIAGNOSIS — N201 Calculus of ureter: Secondary | ICD-10-CM | POA: Diagnosis not present

## 2021-02-26 DIAGNOSIS — B9620 Unspecified Escherichia coli [E. coli] as the cause of diseases classified elsewhere: Secondary | ICD-10-CM | POA: Diagnosis not present

## 2021-02-26 DIAGNOSIS — O99891 Other specified diseases and conditions complicating pregnancy: Secondary | ICD-10-CM | POA: Diagnosis not present

## 2021-02-26 DIAGNOSIS — Z20822 Contact with and (suspected) exposure to covid-19: Secondary | ICD-10-CM | POA: Diagnosis not present

## 2021-02-26 DIAGNOSIS — N1330 Unspecified hydronephrosis: Secondary | ICD-10-CM | POA: Diagnosis not present

## 2021-02-26 DIAGNOSIS — N136 Pyonephrosis: Secondary | ICD-10-CM | POA: Diagnosis not present

## 2021-02-26 HISTORY — PX: CYSTOSCOPY WITH STENT PLACEMENT: SHX5790

## 2021-02-26 SURGERY — CYSTOSCOPY, WITH STENT INSERTION
Anesthesia: Spinal | Laterality: Left

## 2021-02-26 MED ORDER — MEPERIDINE HCL 100 MG/ML IJ SOLN
100.0000 mg | Freq: Once | INTRAMUSCULAR | Status: DC
  Filled 2021-02-26: qty 1

## 2021-02-26 MED ORDER — HYDROMORPHONE HCL 1 MG/ML IJ SOLN
0.5000 mg | INTRAMUSCULAR | Status: DC | PRN
  Administered 2021-02-26 – 2021-02-27 (×6): 0.5 mg via INTRAVENOUS
  Filled 2021-02-26 (×6): qty 1

## 2021-02-26 MED ORDER — PROMETHAZINE HCL 25 MG/ML IJ SOLN
12.5000 mg | Freq: Once | INTRAMUSCULAR | Status: AC
  Administered 2021-02-26: 12.5 mg via INTRAMUSCULAR
  Filled 2021-02-26: qty 1

## 2021-02-26 MED ORDER — FENTANYL CITRATE (PF) 100 MCG/2ML IJ SOLN
25.0000 ug | INTRAMUSCULAR | Status: DC | PRN
  Administered 2021-02-26: 50 ug via INTRAVENOUS

## 2021-02-26 MED ORDER — SODIUM CHLORIDE 0.9 % IV SOLN
1.0000 g | INTRAVENOUS | Status: DC
  Administered 2021-02-26 – 2021-03-02 (×5): 1 g via INTRAVENOUS
  Filled 2021-02-26: qty 1
  Filled 2021-02-26: qty 10
  Filled 2021-02-26 (×2): qty 1
  Filled 2021-02-26: qty 10
  Filled 2021-02-26: qty 1
  Filled 2021-02-26: qty 10

## 2021-02-26 MED ORDER — IOHEXOL 180 MG/ML  SOLN
INTRAMUSCULAR | Status: DC | PRN
  Administered 2021-02-26: 8 mL

## 2021-02-26 MED ORDER — ONDANSETRON HCL 4 MG/2ML IJ SOLN: 4.0000 mg | Freq: Once | INTRAMUSCULAR | Status: DC | PRN

## 2021-02-26 MED ORDER — MEPERIDINE HCL 25 MG/ML IJ SOLN
100.0000 mg | Freq: Once | INTRAMUSCULAR | Status: AC
Start: 2021-02-26 — End: 2021-02-26
  Administered 2021-02-26: 100 mg via INTRAMUSCULAR

## 2021-02-26 MED ORDER — ONDANSETRON HCL 4 MG/2ML IJ SOLN
INTRAMUSCULAR | Status: AC
  Filled 2021-02-26: qty 2

## 2021-02-26 MED ORDER — BUPIVACAINE IN DEXTROSE 0.75-8.25 % IT SOLN
INTRATHECAL | Status: DC | PRN
  Administered 2021-02-26: 1.3 mL via INTRATHECAL

## 2021-02-26 MED ORDER — ACETAMINOPHEN 160 MG/5ML PO SOLN
1000.0000 mg | Freq: Four times a day (QID) | ORAL | Status: DC | PRN
  Administered 2021-02-26: 1000 mg via ORAL
  Filled 2021-02-26 (×3): qty 40.6

## 2021-02-26 MED ORDER — FENTANYL CITRATE (PF) 100 MCG/2ML IJ SOLN
INTRAMUSCULAR | Status: AC
  Administered 2021-02-26: 50 ug via INTRAVENOUS
  Filled 2021-02-26: qty 2

## 2021-02-26 MED ORDER — 0.9 % SODIUM CHLORIDE (POUR BTL) OPTIME
TOPICAL | Status: DC | PRN
  Administered 2021-02-26: 1000 mL
  Administered 2021-02-26: 3000 mL

## 2021-02-26 MED ORDER — ONDANSETRON HCL 4 MG/2ML IJ SOLN
INTRAMUSCULAR | Status: DC | PRN
  Administered 2021-02-26: 4 mg via INTRAVENOUS

## 2021-02-26 MED ORDER — HYDROCODONE-ACETAMINOPHEN 7.5-325 MG PO TABS
1.0000 | ORAL_TABLET | Freq: Once | ORAL | Status: DC | PRN
Start: 2021-02-26 — End: 2021-02-26

## 2021-02-26 MED ORDER — PHENYLEPHRINE HCL (PRESSORS) 10 MG/ML IV SOLN
INTRAVENOUS | Status: DC | PRN
  Administered 2021-02-26 (×2): 100 ug via INTRAVENOUS

## 2021-02-26 SURGICAL SUPPLY — 17 items
BAG DRAIN CYSTO-URO LG1000N (MISCELLANEOUS) ×4 IMPLANT
BRUSH SCRUB EZ  4% CHG (MISCELLANEOUS) ×1
BRUSH SCRUB EZ 4% CHG (MISCELLANEOUS) ×1 IMPLANT
CATH URETL 5X70 OPEN END (CATHETERS) ×2 IMPLANT
CONRAY 43 FOR UROLOGY 50M (MISCELLANEOUS) ×2 IMPLANT
DRAPE UTILITY 15X26 TOWEL STRL (DRAPES) ×2 IMPLANT
GAUZE 4X4 16PLY ~~LOC~~+RFID DBL (SPONGE) ×4 IMPLANT
GOWN STRL REUS W/ TWL LRG LVL3 (GOWN DISPOSABLE) ×1 IMPLANT
GOWN STRL REUS W/TWL LRG LVL3 (GOWN DISPOSABLE) ×2
GUIDEWIRE STR DUAL SENSOR (WIRE) ×2 IMPLANT
IV NS IRRIG 3000ML ARTHROMATIC (IV SOLUTION) ×2 IMPLANT
KIT TURNOVER CYSTO (KITS) ×2 IMPLANT
MANIFOLD NEPTUNE II (INSTRUMENTS) ×2 IMPLANT
PACK CYSTO AR (MISCELLANEOUS) ×2 IMPLANT
SET CYSTO W/LG BORE CLAMP LF (SET/KITS/TRAYS/PACK) ×2 IMPLANT
SURGILUBE 2OZ TUBE FLIPTOP (MISCELLANEOUS) ×2 IMPLANT
WATER STERILE IRR 1000ML POUR (IV SOLUTION) ×2 IMPLANT

## 2021-02-26 NOTE — Anesthesia Procedure Notes (Addendum)
Spinal  Patient location during procedure: OR Start time: 02/26/2021 11:59 AM End time: 02/26/2021 12:01 PM Reason for block: surgical anesthesia Staffing Performed: anesthesiologist  Anesthesiologist: Harrie Foreman, MD Resident/CRNA: Aline Brochure, CRNA Preanesthetic Checklist Completed: patient identified, IV checked, site marked, risks and benefits discussed, surgical consent, monitors and equipment checked, pre-op evaluation and timeout performed Spinal Block Patient position: sitting Prep: ChloraPrep Patient monitoring: heart rate, continuous pulse ox, blood pressure and cardiac monitor Approach: midline Location: L3-4 Injection technique: single-shot Needle Needle type: Introducer and Pencan  Needle gauge: 24 G Needle length: 9 cm Assessment Sensory level: T10 Events: CSF return Additional Notes Negative paresthesia. Negative blood return. Positive free-flowing CSF. Expiration date of kit checked and confirmed. Patient tolerated procedure well, without complications.

## 2021-02-26 NOTE — Transfer of Care (Signed)
Immediate Anesthesia Transfer of Care Note  Patient: Laura Sosa  Procedure(s) Performed: CYSTOSCOPY WITH STENT PLACEMENT (Left)  Patient Location: PACU  Anesthesia Type:Spinal  Level of Consciousness: awake, alert  and oriented  Airway & Oxygen Therapy: Patient Spontanous Breathing  Post-op Assessment: Post -op Vital signs reviewed and stable  Post vital signs: stable  Last Vitals:  Vitals Value Taken Time  BP 86/70 02/26/21 1231  Temp    Pulse 122 02/26/21 1233  Resp 26 02/26/21 1233  SpO2 95 % 02/26/21 1233  Vitals shown include unvalidated device data.  Last Pain:  Vitals:   02/26/21 1116  TempSrc: Tympanic  PainSc:          Complications: No notable events documented.

## 2021-02-26 NOTE — Progress Notes (Signed)
Subjective:    She continues to experience significant left-sided pain.  She states that she did not get much rest of the night.2  Objective:    No data found. No intake/output data recorded.  Labs: Results for orders placed or performed during the hospital encounter of 02/25/21 (from the past 24 hour(s))  Urinalysis, Routine w reflex microscopic     Status: Abnormal   Collection Time: 02/25/21  3:57 PM  Result Value Ref Range   Color, Urine YELLOW (A) YELLOW   APPearance HAZY (A) CLEAR   Specific Gravity, Urine 1.021 1.005 - 1.030   pH 6.0 5.0 - 8.0   Glucose, UA NEGATIVE NEGATIVE mg/dL   Hgb urine dipstick SMALL (A) NEGATIVE   Bilirubin Urine NEGATIVE NEGATIVE   Ketones, ur 20 (A) NEGATIVE mg/dL   Protein, ur 30 (A) NEGATIVE mg/dL   Nitrite NEGATIVE NEGATIVE   Leukocytes,Ua LARGE (A) NEGATIVE   RBC / HPF 21-50 0 - 5 RBC/hpf   WBC, UA >50 (H) 0 - 5 WBC/hpf   Bacteria, UA RARE (A) NONE SEEN   Squamous Epithelial / LPF 0-5 0 - 5   Mucus PRESENT   Resp Panel by RT-PCR (Flu A&B, Covid) Nasopharyngeal Swab     Status: None   Collection Time: 02/25/21  4:14 PM   Specimen: Nasopharyngeal Swab; Nasopharyngeal(NP) swabs in vial transport medium  Result Value Ref Range   SARS Coronavirus 2 by RT PCR NEGATIVE NEGATIVE   Influenza A by PCR NEGATIVE NEGATIVE   Influenza B by PCR NEGATIVE NEGATIVE  CBC     Status: Abnormal   Collection Time: 02/25/21  5:41 PM  Result Value Ref Range   WBC 22.3 (H) 4.0 - 10.5 K/uL   RBC 3.78 (L) 3.87 - 5.11 MIL/uL   Hemoglobin 12.1 12.0 - 15.0 g/dL   HCT 36.1 (L) 44.3 - 15.4 %   MCV 90.5 80.0 - 100.0 fL   MCH 32.0 26.0 - 34.0 pg   MCHC 35.4 30.0 - 36.0 g/dL   RDW 00.8 67.6 - 19.5 %   Platelets 256 150 - 400 K/uL   nRBC 0.0 0.0 - 0.2 %  Glucose, capillary     Status: Abnormal   Collection Time: 02/25/21  7:18 PM  Result Value Ref Range   Glucose-Capillary 101 (H) 70 - 99 mg/dL    Medications    Current Discharge Medication List      CONTINUE these medications which have NOT CHANGED   Details  famotidine (PEPCID) 40 MG/5ML suspension Take 2.5 mLs (20 mg total) by mouth 2 (two) times daily. Qty: 50 mL, Refills: 0    hydrOXYzine (ATARAX/VISTARIL) 50 MG tablet Take 1 tablet (50 mg total) by mouth every 6 (six) hours as needed for nausea or vomiting. Qty: 30 tablet, Refills: 1    metoCLOPramide (REGLAN) 5 MG/5ML solution Take 10 mLs (10 mg total) by mouth every 6 (six) hours as needed for nausea. Qty: 120 mL, Refills: 1    ondansetron (ZOFRAN-ODT) 4 MG disintegrating tablet Take 1 tablet (4 mg total) by mouth every 8 (eight) hours as needed for nausea or vomiting. Qty: 60 tablet, Refills: 0    Prenatal Vit-Fe Fumarate-FA (MULTIVITAMIN-PRENATAL) 27-0.8 MG TABS tablet Take 1 tablet by mouth daily at 12 noon.    promethazine (PHENERGAN) 6.25 MG/5ML syrup Take 20 mLs (25 mg total) by mouth every 6 (six) hours as needed for nausea or vomiting. Qty: 120 mL, Refills: 1    scopolamine (TRANSDERM-SCOP)  1 MG/3DAYS Place 1 patch (1.5 mg total) onto the skin every 3 (three) days. Qty: 10 patch, Refills: 12          Assessment:    Patient with renal calculi having left-sided pain.  21 weeks estimated gestational age-normal fetal heart tones  Plan:    Discussed care with urology.  Dr. Alvester Morin to see her today and consider ureteral stenting.  To consider antibiotics.  Have discussed care with patient.  Today I spent greater than 30 minutes in the direct care of this patient.  Elonda Husky, M.D. 02/26/2021 10:27 AM

## 2021-02-26 NOTE — H&P (View-Only) (Signed)
H&P Physician requesting consult: Brennan Bailey  Chief Complaint: Left ureteral stone, concern for UTI  History of Present Illness: 35 year old female with a history of nephrolithiasis.  She required right diagnostic ureteroscopy for a ureteral stone back in 2018.  She is is 21 weeks estimated gestational age.  Uncomplicated pregnancy so far.  She has a history of an E. coli UTI that was pansensitive back in May.  She presented yesterday with severe left-sided flank pain.  She underwent a renal ultrasound that revealed mild to moderate left hydronephrosis with 2 left renal calculi 1 of which was within the renal pelvis near the UPJ.  She continues to have severe left-sided flank pain and had a rough night overnight.  She had leukocytosis of 22.3 yesterday evening.  COVID-negative.  Urinalysis with large leukocyte, rare bacteria, greater than 50 WBCs.  She has had a low-grade fever of 99.7.  Past Medical History:  Diagnosis Date   Headache    History of kidney stones    Migraine    Pyelonephritis 04/17/2016   Past Surgical History:  Procedure Laterality Date   COLPOSCOPY     CYSTOSCOPY/RETROGRADE/URETEROSCOPY  03/15/2017   Procedure: CYSTOSCOPY/RETROGRADE/URETEROSCOPY;  Surgeon: Hildred Laser, MD;  Location: [facility] ORS;  Service: Urology;;   LEEP      Home Medications:  Facility-Administered Medications Prior to Admission  Medication Dose Route Frequency Provider Last Rate Last Admin   cefTRIAXone (ROCEPHIN) injection 1 g  1 g Intramuscular Q24H Hildred Laser, MD   1 g at 01/20/21 1635   Medications Prior to Admission  Medication Sig Dispense Refill Last Dose   famotidine (PEPCID) 40 MG/5ML suspension Take 2.5 mLs (20 mg total) by mouth 2 (two) times daily. (Patient not taking: Reported on 02/25/2021) 50 mL 0 Not Taking   hydrOXYzine (ATARAX/VISTARIL) 50 MG tablet Take 1 tablet (50 mg total) by mouth every 6 (six) hours as needed for nausea or vomiting. (Patient not taking: No sig  reported) 30 tablet 1 Not Taking   metoCLOPramide (REGLAN) 5 MG/5ML solution Take 10 mLs (10 mg total) by mouth every 6 (six) hours as needed for nausea. (Patient not taking: Reported on 02/25/2021) 120 mL 1 Not Taking   ondansetron (ZOFRAN-ODT) 4 MG disintegrating tablet Take 1 tablet (4 mg total) by mouth every 8 (eight) hours as needed for nausea or vomiting. (Patient not taking: Reported on 02/25/2021) 60 tablet 0 Not Taking   Prenatal Vit-Fe Fumarate-FA (MULTIVITAMIN-PRENATAL) 27-0.8 MG TABS tablet Take 1 tablet by mouth daily at 12 noon. (Patient not taking: Reported on 02/25/2021)   Not Taking   promethazine (PHENERGAN) 6.25 MG/5ML syrup Take 20 mLs (25 mg total) by mouth every 6 (six) hours as needed for nausea or vomiting. (Patient not taking: Reported on 02/25/2021) 120 mL 1 Not Taking   scopolamine (TRANSDERM-SCOP) 1 MG/3DAYS Place 1 patch (1.5 mg total) onto the skin every 3 (three) days. (Patient not taking: No sig reported) 10 patch 12 Not Taking   Allergies: No Known Allergies  Family History  Problem Relation Age of Onset   Hypertension Mother    Diabetes Mother    Diabetes Father    Hypertension Father    Emphysema Maternal Grandmother    Lung cancer Maternal Grandmother    Asthma Daughter    Hypertension Paternal Grandmother    Heart defect Half-Sister        "boot shaped heart"   Kidney cancer Neg Hx    Bladder Cancer Neg Hx    Breast  cancer Neg Hx    Ovarian cancer Neg Hx    Colon cancer Neg Hx    Social History:  reports that she has never smoked. She has never used smokeless tobacco. She reports that she does not drink alcohol and does not use drugs.  ROS: A complete review of systems was performed.  All systems are negative except for pertinent findings as noted. ROS   Physical Exam:  Vital signs in last 24 hours: Temp:  [97.2 F (36.2 C)-99.7 F (37.6 C)] 99 F (37.2 C) (07/03 1116) Pulse Rate:  [79-112] 111 (07/03 1116) Resp:  [14-18] 18 (07/03 1116) BP:  (99-117)/(53-72) 115/72 (07/03 1116) SpO2:  [96 %-98 %] 98 % (07/03 1116) Weight:  [76.2 kg] 76.2 kg (07/03 0749) General:  Alert and oriented, No acute distress. HEENT: Normocephalic, atraumatic Neck: No JVD or lymphadenopathy Cardiovascular: Regular rate and rhythm Lungs: Regular rate and effort Abdomen: Soft, nontender, nondistended, no abdominal masses Back: No CVA tenderness Extremities: No edema Neurologic: Grossly intact  Laboratory Data:  Results for orders placed or performed during the hospital encounter of 02/25/21 (from the past 24 hour(s))  Urinalysis, Routine w reflex microscopic     Status: Abnormal   Collection Time: 02/25/21  3:57 PM  Result Value Ref Range   Color, Urine YELLOW (A) YELLOW   APPearance HAZY (A) CLEAR   Specific Gravity, Urine 1.021 1.005 - 1.030   pH 6.0 5.0 - 8.0   Glucose, UA NEGATIVE NEGATIVE mg/dL   Hgb urine dipstick SMALL (A) NEGATIVE   Bilirubin Urine NEGATIVE NEGATIVE   Ketones, ur 20 (A) NEGATIVE mg/dL   Protein, ur 30 (A) NEGATIVE mg/dL   Nitrite NEGATIVE NEGATIVE   Leukocytes,Ua LARGE (A) NEGATIVE   RBC / HPF 21-50 0 - 5 RBC/hpf   WBC, UA >50 (H) 0 - 5 WBC/hpf   Bacteria, UA RARE (A) NONE SEEN   Squamous Epithelial / LPF 0-5 0 - 5   Mucus PRESENT   Resp Panel by RT-PCR (Flu A&B, Covid) Nasopharyngeal Swab     Status: None   Collection Time: 02/25/21  4:14 PM   Specimen: Nasopharyngeal Swab; Nasopharyngeal(NP) swabs in vial transport medium  Result Value Ref Range   SARS Coronavirus 2 by RT PCR NEGATIVE NEGATIVE   Influenza A by PCR NEGATIVE NEGATIVE   Influenza B by PCR NEGATIVE NEGATIVE  CBC     Status: Abnormal   Collection Time: 02/25/21  5:41 PM  Result Value Ref Range   WBC 22.3 (H) 4.0 - 10.5 K/uL   RBC 3.78 (L) 3.87 - 5.11 MIL/uL   Hemoglobin 12.1 12.0 - 15.0 g/dL   HCT 95.6 (L) 38.7 - 56.4 %   MCV 90.5 80.0 - 100.0 fL   MCH 32.0 26.0 - 34.0 pg   MCHC 35.4 30.0 - 36.0 g/dL   RDW 33.2 95.1 - 88.4 %   Platelets 256  150 - 400 K/uL   nRBC 0.0 0.0 - 0.2 %  Glucose, capillary     Status: Abnormal   Collection Time: 02/25/21  7:18 PM  Result Value Ref Range   Glucose-Capillary 101 (H) 70 - 99 mg/dL   Recent Results (from the past 240 hour(s))  Resp Panel by RT-PCR (Flu A&B, Covid) Nasopharyngeal Swab     Status: None   Collection Time: 02/25/21  4:14 PM   Specimen: Nasopharyngeal Swab; Nasopharyngeal(NP) swabs in vial transport medium  Result Value Ref Range Status   SARS Coronavirus 2 by RT PCR  NEGATIVE NEGATIVE Final    Comment: (NOTE) SARS-CoV-2 target nucleic acids are NOT DETECTED.  The SARS-CoV-2 RNA is generally detectable in upper respiratory specimens during the acute phase of infection. The lowest concentration of SARS-CoV-2 viral copies this assay can detect is 138 copies/mL. A negative result does not preclude SARS-Cov-2 infection and should not be used as the sole basis for treatment or other patient management decisions. A negative result may occur with  improper specimen collection/handling, submission of specimen other than nasopharyngeal swab, presence of viral mutation(s) within the areas targeted by this assay, and inadequate number of viral copies(<138 copies/mL). A negative result must be combined with clinical observations, patient history, and epidemiological information. The expected result is Negative.  Fact Sheet for Patients:  BloggerCourse.com  Fact Sheet for Healthcare Providers:  SeriousBroker.it  This test is no t yet approved or cleared by the [place] FDA and  has been authorized for detection and/or diagnosis of SARS-CoV-2 by FDA under an Emergency Use Authorization (EUA). This EUA will remain  in effect (meaning this test can be used) for the duration of the COVID-19 declaration under Section 564(b)(1) of the Act, 21 U.S.C.section 360bbb-3(b)(1), unless the authorization is terminated  or revoked sooner.        Influenza A by PCR NEGATIVE NEGATIVE Final   Influenza B by PCR NEGATIVE NEGATIVE Final    Comment: (NOTE) The Xpert Xpress SARS-CoV-2/FLU/RSV plus assay is intended as an aid in the diagnosis of influenza from Nasopharyngeal swab specimens and should not be used as a sole basis for treatment. Nasal washings and aspirates are unacceptable for Xpert Xpress SARS-CoV-2/FLU/RSV testing.  Fact Sheet for Patients: BloggerCourse.com  Fact Sheet for Healthcare Providers: SeriousBroker.it  This test is not yet approved or cleared by the [place] FDA and has been authorized for detection and/or diagnosis of SARS-CoV-2 by FDA under an Emergency Use Authorization (EUA). This EUA will remain in effect (meaning this test can be used) for the duration of the COVID-19 declaration under Section 564(b)(1) of the Act, 21 U.S.C. section 360bbb-3(b)(1), unless the authorization is terminated or revoked.  Performed at [facility], [address] [place], [place] [address]    Creatinine: No results for input(s): CREATININE in the last 168 hours.  Renal ultrasound personally reviewed and is detailed in history of present illness  Impression/Assessment:  Left renal probable ureteral calculi Left hydronephrosis secondary to ureteral obstruction Urinary tract infection  Plan:  Given her persistent left-sided pain as well as concern for active stone, I discussed left ureteral stent placement versus nephrostomy tube.  She has elected for left-sided ureteral stent placement.  I specifically discussed the risks of fluoroscopy being used during pregnancy.  This should be minimal and therefore minimal risk.  She will then need definitive management after at least a couple of weeks or so.  I ordered ceftriaxone after discussion with Dr. Logan Bores.  Ray Church, III 02/26/2021, 11:25 AM

## 2021-02-26 NOTE — Interval H&P Note (Signed)
History and Physical Interval Note:  02/26/2021 11:39 AM  [place] R Bry  has presented today for surgery, with the diagnosis of left ureteral stone with UTI.  The various methods of treatment have been discussed with the patient and family. After consideration of risks, benefits and other options for treatment, the patient has consented to  Procedure(s): CYSTOSCOPY WITH STENT PLACEMENT (Left) as a surgical intervention.  The patient's history has been reviewed, patient examined, no change in status, stable for surgery.  I have reviewed the patient's chart and labs.  Questions were answered to the patient's satisfaction.     Ray Church, III

## 2021-02-26 NOTE — Consult Note (Signed)
H&P Physician requesting consult: Brennan Bailey  Chief Complaint: Left ureteral stone, concern for UTI  History of Present Illness: 35 year old female with a history of nephrolithiasis.  She required right diagnostic ureteroscopy for a ureteral stone back in 2018.  She is is 21 weeks estimated gestational age.  Uncomplicated pregnancy so far.  She has a history of an E. coli UTI that was pansensitive back in May.  She presented yesterday with severe left-sided flank pain.  She underwent a renal ultrasound that revealed mild to moderate left hydronephrosis with 2 left renal calculi 1 of which was within the renal pelvis near the UPJ.  She continues to have severe left-sided flank pain and had a rough night overnight.  She had leukocytosis of 22.3 yesterday evening.  COVID-negative.  Urinalysis with large leukocyte, rare bacteria, greater than 50 WBCs.  She has had a low-grade fever of 99.7.  Past Medical History:  Diagnosis Date   Headache    History of kidney stones    Migraine    Pyelonephritis 04/17/2016   Past Surgical History:  Procedure Laterality Date   COLPOSCOPY     CYSTOSCOPY/RETROGRADE/URETEROSCOPY  03/15/2017   Procedure: CYSTOSCOPY/RETROGRADE/URETEROSCOPY;  Surgeon: Hildred Laser, MD;  Location: [facility] ORS;  Service: Urology;;   LEEP      Home Medications:  Facility-Administered Medications Prior to Admission  Medication Dose Route Frequency Provider Last Rate Last Admin   cefTRIAXone (ROCEPHIN) injection 1 g  1 g Intramuscular Q24H Hildred Laser, MD   1 g at 01/20/21 1635   Medications Prior to Admission  Medication Sig Dispense Refill Last Dose   famotidine (PEPCID) 40 MG/5ML suspension Take 2.5 mLs (20 mg total) by mouth 2 (two) times daily. (Patient not taking: Reported on 02/25/2021) 50 mL 0 Not Taking   hydrOXYzine (ATARAX/VISTARIL) 50 MG tablet Take 1 tablet (50 mg total) by mouth every 6 (six) hours as needed for nausea or vomiting. (Patient not taking: No sig  reported) 30 tablet 1 Not Taking   metoCLOPramide (REGLAN) 5 MG/5ML solution Take 10 mLs (10 mg total) by mouth every 6 (six) hours as needed for nausea. (Patient not taking: Reported on 02/25/2021) 120 mL 1 Not Taking   ondansetron (ZOFRAN-ODT) 4 MG disintegrating tablet Take 1 tablet (4 mg total) by mouth every 8 (eight) hours as needed for nausea or vomiting. (Patient not taking: Reported on 02/25/2021) 60 tablet 0 Not Taking   Prenatal Vit-Fe Fumarate-FA (MULTIVITAMIN-PRENATAL) 27-0.8 MG TABS tablet Take 1 tablet by mouth daily at 12 noon. (Patient not taking: Reported on 02/25/2021)   Not Taking   promethazine (PHENERGAN) 6.25 MG/5ML syrup Take 20 mLs (25 mg total) by mouth every 6 (six) hours as needed for nausea or vomiting. (Patient not taking: Reported on 02/25/2021) 120 mL 1 Not Taking   scopolamine (TRANSDERM-SCOP) 1 MG/3DAYS Place 1 patch (1.5 mg total) onto the skin every 3 (three) days. (Patient not taking: No sig reported) 10 patch 12 Not Taking   Allergies: No Known Allergies  Family History  Problem Relation Age of Onset   Hypertension Mother    Diabetes Mother    Diabetes Father    Hypertension Father    Emphysema Maternal Grandmother    Lung cancer Maternal Grandmother    Asthma Daughter    Hypertension Paternal Grandmother    Heart defect Half-Sister        "boot shaped heart"   Kidney cancer Neg Hx    Bladder Cancer Neg Hx    Breast  cancer Neg Hx    Ovarian cancer Neg Hx    Colon cancer Neg Hx    Social History:  reports that she has never smoked. She has never used smokeless tobacco. She reports that she does not drink alcohol and does not use drugs.  ROS: A complete review of systems was performed.  All systems are negative except for pertinent findings as noted. ROS   Physical Exam:  Vital signs in last 24 hours: Temp:  [97.2 F (36.2 C)-99.7 F (37.6 C)] 99 F (37.2 C) (07/03 1116) Pulse Rate:  [79-112] 111 (07/03 1116) Resp:  [14-18] 18 (07/03 1116) BP:  (99-117)/(53-72) 115/72 (07/03 1116) SpO2:  [96 %-98 %] 98 % (07/03 1116) Weight:  [76.2 kg] 76.2 kg (07/03 0749) General:  Alert and oriented, No acute distress. HEENT: Normocephalic, atraumatic Neck: No JVD or lymphadenopathy Cardiovascular: Regular rate and rhythm Lungs: Regular rate and effort Abdomen: Soft, nontender, nondistended, no abdominal masses Back: No CVA tenderness Extremities: No edema Neurologic: Grossly intact  Laboratory Data:  Results for orders placed or performed during the hospital encounter of 02/25/21 (from the past 24 hour(s))  Urinalysis, Routine w reflex microscopic     Status: Abnormal   Collection Time: 02/25/21  3:57 PM  Result Value Ref Range   Color, Urine YELLOW (A) YELLOW   APPearance HAZY (A) CLEAR   Specific Gravity, Urine 1.021 1.005 - 1.030   pH 6.0 5.0 - 8.0   Glucose, UA NEGATIVE NEGATIVE mg/dL   Hgb urine dipstick SMALL (A) NEGATIVE   Bilirubin Urine NEGATIVE NEGATIVE   Ketones, ur 20 (A) NEGATIVE mg/dL   Protein, ur 30 (A) NEGATIVE mg/dL   Nitrite NEGATIVE NEGATIVE   Leukocytes,Ua LARGE (A) NEGATIVE   RBC / HPF 21-50 0 - 5 RBC/hpf   WBC, UA >50 (H) 0 - 5 WBC/hpf   Bacteria, UA RARE (A) NONE SEEN   Squamous Epithelial / LPF 0-5 0 - 5   Mucus PRESENT   Resp Panel by RT-PCR (Flu A&B, Covid) Nasopharyngeal Swab     Status: None   Collection Time: 02/25/21  4:14 PM   Specimen: Nasopharyngeal Swab; Nasopharyngeal(NP) swabs in vial transport medium  Result Value Ref Range   SARS Coronavirus 2 by RT PCR NEGATIVE NEGATIVE   Influenza A by PCR NEGATIVE NEGATIVE   Influenza B by PCR NEGATIVE NEGATIVE  CBC     Status: Abnormal   Collection Time: 02/25/21  5:41 PM  Result Value Ref Range   WBC 22.3 (H) 4.0 - 10.5 K/uL   RBC 3.78 (L) 3.87 - 5.11 MIL/uL   Hemoglobin 12.1 12.0 - 15.0 g/dL   HCT 95.6 (L) 38.7 - 56.4 %   MCV 90.5 80.0 - 100.0 fL   MCH 32.0 26.0 - 34.0 pg   MCHC 35.4 30.0 - 36.0 g/dL   RDW 33.2 95.1 - 88.4 %   Platelets 256  150 - 400 K/uL   nRBC 0.0 0.0 - 0.2 %  Glucose, capillary     Status: Abnormal   Collection Time: 02/25/21  7:18 PM  Result Value Ref Range   Glucose-Capillary 101 (H) 70 - 99 mg/dL   Recent Results (from the past 240 hour(s))  Resp Panel by RT-PCR (Flu A&B, Covid) Nasopharyngeal Swab     Status: None   Collection Time: 02/25/21  4:14 PM   Specimen: Nasopharyngeal Swab; Nasopharyngeal(NP) swabs in vial transport medium  Result Value Ref Range Status   SARS Coronavirus 2 by RT PCR  NEGATIVE NEGATIVE Final    Comment: (NOTE) SARS-CoV-2 target nucleic acids are NOT DETECTED.  The SARS-CoV-2 RNA is generally detectable in upper respiratory specimens during the acute phase of infection. The lowest concentration of SARS-CoV-2 viral copies this assay can detect is 138 copies/mL. A negative result does not preclude SARS-Cov-2 infection and should not be used as the sole basis for treatment or other patient management decisions. A negative result may occur with  improper specimen collection/handling, submission of specimen other than nasopharyngeal swab, presence of viral mutation(s) within the areas targeted by this assay, and inadequate number of viral copies(<138 copies/mL). A negative result must be combined with clinical observations, patient history, and epidemiological information. The expected result is Negative.  Fact Sheet for Patients:  BloggerCourse.com  Fact Sheet for Healthcare Providers:  SeriousBroker.it  This test is no t yet approved or cleared by the [place] FDA and  has been authorized for detection and/or diagnosis of SARS-CoV-2 by FDA under an Emergency Use Authorization (EUA). This EUA will remain  in effect (meaning this test can be used) for the duration of the COVID-19 declaration under Section 564(b)(1) of the Act, 21 U.S.C.section 360bbb-3(b)(1), unless the authorization is terminated  or revoked sooner.        Influenza A by PCR NEGATIVE NEGATIVE Final   Influenza B by PCR NEGATIVE NEGATIVE Final    Comment: (NOTE) The Xpert Xpress SARS-CoV-2/FLU/RSV plus assay is intended as an aid in the diagnosis of influenza from Nasopharyngeal swab specimens and should not be used as a sole basis for treatment. Nasal washings and aspirates are unacceptable for Xpert Xpress SARS-CoV-2/FLU/RSV testing.  Fact Sheet for Patients: BloggerCourse.com  Fact Sheet for Healthcare Providers: SeriousBroker.it  This test is not yet approved or cleared by the [place] FDA and has been authorized for detection and/or diagnosis of SARS-CoV-2 by FDA under an Emergency Use Authorization (EUA). This EUA will remain in effect (meaning this test can be used) for the duration of the COVID-19 declaration under Section 564(b)(1) of the Act, 21 U.S.C. section 360bbb-3(b)(1), unless the authorization is terminated or revoked.  Performed at [facility], [address] [place], [place] [address]    Creatinine: No results for input(s): CREATININE in the last 168 hours.  Renal ultrasound personally reviewed and is detailed in history of present illness  Impression/Assessment:  Left renal probable ureteral calculi Left hydronephrosis secondary to ureteral obstruction Urinary tract infection  Plan:  Given her persistent left-sided pain as well as concern for active stone, I discussed left ureteral stent placement versus nephrostomy tube.  She has elected for left-sided ureteral stent placement.  I specifically discussed the risks of fluoroscopy being used during pregnancy.  This should be minimal and therefore minimal risk.  She will then need definitive management after at least a couple of weeks or so.  I ordered ceftriaxone after discussion with Dr. Logan Bores.  Ray Church, III 02/26/2021, 11:25 AM

## 2021-02-26 NOTE — Discharge Instructions (Signed)
Post Ureteroscopy With or Without Stent Instructions  Definitions:  Ureter: The duct that transports urine from the kidney to the bladder. Stent:   A plastic hollow tube that is placed into the ureter, from the kidney to the                 bladder to prevent the ureter from swelling shut.  GENERAL INSTRUCTIONS:  Despite the fact that no skin incisions were used, the area around the ureter and bladder is raw and irritated. The stent is a foreign body which will further irritate the bladder wall. This irritation is manifested by increased frequency of urination, both day and night, and by an increase in the urge to urinate. In some, the urge to urinate is present almost always. Sometimes the urge is strong enough that you may not be able to stop yourself from urinating. The only real cure is to remove the stent and then give time for the bladder wall to heal which can't be done until the danger of the ureter swelling shut has passed, which varies.  You may see some blood in your urine while the stent is in place and a few days afterwards. Do not be alarmed, even if the urine was clear for a while. Get off your feet and drink lots of fluids until clearing occurs. If you start to pass clots or don't improve, call us.  DIET: You may return to your normal diet immediately. Because of the raw surface of your bladder, alcohol, spicy foods, acid type foods and drinks with caffeine may cause irritation or frequency and should be used in moderation. To keep your urine flowing freely and to avoid constipation, drink plenty of fluids during the day ( 8-10 glasses ). Tip: Avoid cranberry juice because it is very acidic.  ACTIVITY: Your physical activity doesn't need to be restricted. However, if you are very active, you may see some blood in your urine. We suggest that you reduce your activity under these circumstances until the bleeding has stopped.  BOWELS: It is important to keep your bowels regular  during the postoperative period. Straining with bowel movements can cause bleeding. A bowel movement every other day is reasonable. Use a mild laxative if needed, such as Milk of Magnesia 2-3 tablespoons, or 2 Dulcolax tablets. Call if you continue to have problems. If you have been taking narcotics for pain, before, during or after your surgery, you may be constipated. Take a laxative if necessary.   MEDICATION: You should resume your pre-surgery medications unless told not to. You may take oxybutynin or flomax if prescribed for bladder spasms or discomfort from the stent Take pain medication as directed for pain refractory to conservative management  PROBLEMS YOU SHOULD REPORT TO [place]: Fevers over 100.5 Fahrenheit. Heavy bleeding, or clots ( See above notes about blood in urine ). Inability to urinate. Drug reactions ( hives, rash, nausea, vomiting, diarrhea ). Severe burning or pain with urination that is not improving.

## 2021-02-26 NOTE — Anesthesia Postprocedure Evaluation (Signed)
Anesthesia Post Note  Patient: [place] R Mckneely  Procedure(s) Performed: CYSTOSCOPY WITH STENT PLACEMENT (Left)  Patient location during evaluation: PACU Anesthesia Type: Spinal Level of consciousness: awake Pain management: pain level controlled Vital Signs Assessment: post-procedure vital signs reviewed and stable Respiratory status: spontaneous breathing Cardiovascular status: blood pressure returned to baseline and stable Postop Assessment: no headache Anesthetic complications: no Comments: Patient doing well, no issues;   No notable events documented.   Last Vitals:  Vitals:   02/26/21 1300 02/26/21 1315  BP: 104/66 100/64  Pulse: (!) 115 (!) 117  Resp: (!) 26 (!) 33  Temp:    SpO2: 97% 95%    Last Pain:  Vitals:   02/26/21 1300  TempSrc:   PainSc: Asleep    LLE Motor Response: No movement due to regional block (02/26/21 1310) LLE Sensation: Full sensation (02/26/21 1310) RLE Motor Response: Purposeful movement (02/26/21 1310) RLE Sensation: Full sensation (02/26/21 1310)      Briant Cedar

## 2021-02-26 NOTE — Progress Notes (Signed)
Spoke with patient and reviewed chart.  Has leukocytosis and left renal/ureteral stones with hydronephrosis. Right mild hydro more likely related to pregnancy. Given concern for obstructing stone with UTI, I discussed ureteral stent vs PCN with her. She would like to proceed with left ureteral stent placement. I discussed the risks of the surgery and flouroscopy in relation to pregnancy. She is 21 weeks so risk is low. I will use as little of flouro as I can.  She has been posted for OR. Full note to follow.

## 2021-02-26 NOTE — Anesthesia Preprocedure Evaluation (Signed)
Anesthesia Evaluation   Patient awake    Airway Mallampati: II  TM Distance: >3 FB Neck ROM: Full    Dental   Pulmonary           Cardiovascular      Neuro/Psych  Headaches,    GI/Hepatic   Endo/Other    Renal/GU Renal disease (renal stone left side)     Musculoskeletal   Abdominal   Peds  Hematology   Anesthesia Other Findings   Reproductive/Obstetrics (+) Pregnancy                            Anesthesia Physical Anesthesia Plan  ASA: 2  Anesthesia Plan: Spinal   Post-op Pain Management:    Induction:   PONV Risk Score and Plan: 2 and Ondansetron  Airway Management Planned: Nasal Cannula  Additional Equipment:   Intra-op Plan:   Post-operative Plan:   Informed Consent:   Plan Discussed with:   Anesthesia Plan Comments:         Anesthesia Quick Evaluation

## 2021-02-26 NOTE — Op Note (Signed)
Operative Note  Preoperative diagnosis:  1.  Left ureteral calculus with UTI  Post operative diagnosis: 1.  Left ureteral calculus with UTI  Procedure(s): 1.  Cystoscopy with left retrograde pyelogram and left ureteral stent placement  Surgeon: Modena Slater, MD  Assistants: None  Anesthesia: Spinal  Complications: None immediate  EBL: Minimal  Specimens: 1.  None  Drains/Catheters: 1.  6 X 24 double-J ureteral stent  Intraoperative findings: 1.  Normal urethra and bladder 2.  left retrograde pyelogram revealed a filling defect at the level of the stone with upstream hydroureteronephrosis.  There was cloudy efflux from the ureter after stent placement  Indication: 34 year old female pregnant with an estimated gestational age [redacted] weeks.  She presented with leukocytosis, low-grade fever, left flank pain.  Ultrasound showed mild to moderate hydronephrosis with renal and renal pelvic calculus.  Due to concern for obstructing calculus with UTI and persistent pain, decision was made to proceed with the above operation risk, benefits, alternatives  Description of procedure:  The patient was identified and consent was obtained.  The patient was taken to the operating room and placed in the supine position.  The patient was placed under general anesthesia.  Perioperative antibiotics were administered.  The patient was placed in dorsal lithotomy.  Patient was prepped and draped in a standard sterile fashion and a timeout was performed.  A 21 French rigid cystoscope was advanced into the urethra and into the bladder.  The left distal most portion of the ureter was cannulated with an open-ended ureteral catheter.  Retrograde pyelogram was performed with the findings noted above.  A sensor wire was then advanced up to the kidney under fluoroscopic guidance.  A 6 X 24 double-J ureteral stent was advanced up to the kidney under fluoroscopic guidance.  The wire was withdrawn and fluoroscopy confirmed  good proximal placement and direct visualization confirmed a good coil within the bladder.  The bladder was drained and the scope withdrawn.  This concluded the operation.  Patient tolerated procedure well and was stable postoperatively.  Plan: Continue to monitor and follow urine culture.  Continue ceftriaxone and discharged with 10 to 14 days of appropriate oral antibiotic once culture returns.  She will need to follow-up outpatient for discussion definitive management.

## 2021-02-27 ENCOUNTER — Other Ambulatory Visit: Payer: Self-pay

## 2021-02-27 ENCOUNTER — Encounter: Payer: Self-pay | Admitting: Urology

## 2021-02-27 DIAGNOSIS — N200 Calculus of kidney: Secondary | ICD-10-CM | POA: Diagnosis not present

## 2021-02-27 DIAGNOSIS — Z3A21 21 weeks gestation of pregnancy: Secondary | ICD-10-CM | POA: Diagnosis not present

## 2021-02-27 DIAGNOSIS — N1330 Unspecified hydronephrosis: Secondary | ICD-10-CM | POA: Diagnosis not present

## 2021-02-27 DIAGNOSIS — N201 Calculus of ureter: Secondary | ICD-10-CM | POA: Diagnosis not present

## 2021-02-27 DIAGNOSIS — O26832 Pregnancy related renal disease, second trimester: Secondary | ICD-10-CM | POA: Diagnosis not present

## 2021-02-27 MED ORDER — ACETAMINOPHEN 10 MG/ML IV SOLN
1000.0000 mg | Freq: Four times a day (QID) | INTRAVENOUS | Status: AC
  Administered 2021-02-27 – 2021-02-28 (×4): 1000 mg via INTRAVENOUS
  Filled 2021-02-27 (×3): qty 100

## 2021-02-27 MED ORDER — MORPHINE SULFATE (PF) 2 MG/ML IV SOLN
2.0000 mg | INTRAVENOUS | Status: DC | PRN
Start: 2021-02-27 — End: 2021-03-02
  Administered 2021-02-27 – 2021-03-01 (×16): 2 mg via INTRAVENOUS
  Filled 2021-02-27 (×17): qty 1

## 2021-02-27 MED ORDER — OXYBUTYNIN CHLORIDE 5 MG PO TABS
5.0000 mg | ORAL_TABLET | Freq: Three times a day (TID) | ORAL | Status: DC
  Administered 2021-02-27 – 2021-03-02 (×11): 5 mg via ORAL
  Filled 2021-02-27 (×12): qty 1

## 2021-02-27 MED ORDER — OXYCODONE HCL 5 MG PO TABS
5.0000 mg | ORAL_TABLET | ORAL | Status: DC | PRN
Start: 2021-02-27 — End: 2021-03-02
  Administered 2021-02-27 – 2021-03-02 (×19): 5 mg via ORAL
  Filled 2021-02-27 (×19): qty 1

## 2021-02-27 MED ORDER — LIDOCAINE 5 % EX PTCH
1.0000 | MEDICATED_PATCH | CUTANEOUS | Status: DC
  Administered 2021-02-27 – 2021-03-02 (×4): 1 via TRANSDERMAL
  Filled 2021-02-27 (×4): qty 1

## 2021-02-27 MED ORDER — DOCUSATE SODIUM 100 MG PO CAPS
100.0000 mg | ORAL_CAPSULE | Freq: Two times a day (BID) | ORAL | Status: DC
  Administered 2021-02-27 – 2021-02-28 (×3): 100 mg via ORAL
  Filled 2021-02-27 (×3): qty 1

## 2021-02-27 NOTE — Progress Notes (Signed)
Patient with continued severe pain despite the stent. Has been taking PRN dilaudid 0.5mg .   Ucx growing gram neg rods. Has been afebrile  Plan to switch pain medications:  Stop dilaudid PRN Start PO oxycodone 5mg  q 3 hr, IV tylenol x 24 hours, lidocaine patch to the left side of the back, morphine IV for breakthrough pain  Also discussed w/ Dr and have added oxybutynin 5mg  TID. Also continue flomax.  Avoiding toradol/NSAIDs since [redacted] weeks pregnant  Continue rocephin until cultures return then can likely switch to PO. Infection can certainly be contributing to the pain so hopefully the above changes will provide her better relief as antibiotics continue to treat the infection.   Occasionally if a stent is not tolerated, we have to switch to nephrostomy. However hopefully we can get her pain under better control until she can have ureteroscopy for the stone after an appropriate time frame on antibiotics.

## 2021-02-27 NOTE — Progress Notes (Signed)
Urology Inpatient Progress Report  Pregnancy [Z34.90]  Procedure(s): CYSTOSCOPY WITH STENT PLACEMENT  1 Day Post-Op   Intv/Subj: Pain improved somewhat with IV tylenol, a dose of oxybutynin and 48mf IV morphine. Pain starting to return. Ucx growing e coli, sensitivities pending.  Active Problems:   Pregnancy  Current Facility-Administered Medications  Medication Dose Route Frequency Provider Last Rate Last Admin   acetaminophen (OFIRMEV) IV 1,000 mg  1,000 mg Intravenous Q6H Ray Church III, MD   Stopped at 02/27/21 [phone]   cefTRIAXone (ROCEPHIN) 1 g in sodium chloride 0.9 % 100 mL IVPB  1 g Intravenous Q24H Ray Church III, MD   Stopped at 02/27/21 1045   docusate sodium (COLACE) capsule 100 mg  100 mg Oral BID Ray Church III, MD   100 mg at 02/27/21 1017   lactated ringers infusion   Intravenous Continuous Linzie Collin, MD 125 mL/hr at 02/27/21 1236 Infusion Verify at 02/27/21 1236   lidocaine (LIDODERM) 5 % 1 patch  1 patch Transdermal Q24H Ray Church III, MD   1 patch at 02/27/21 0845   morphine 2 MG/ML injection 2 mg  2 mg Intravenous Q3H PRN Ray Church III, MD   2 mg at 02/27/21 1230   ondansetron (ZOFRAN) injection 4 mg  4 mg Intravenous Q6H PRN Linzie Collin, MD   4 mg at 02/27/21 0803   oxybutynin (DITROPAN) tablet 5 mg  5 mg Oral TID Ray Church III, MD   5 mg at 02/27/21 1016   oxyCODONE (Oxy IR/ROXICODONE) immediate release tablet 5 mg  5 mg Oral Q3H PRN Ray Church III, MD   5 mg at 02/27/21 1117   tamsulosin (FLOMAX) capsule 0.4 mg  0.4 mg Oral Daily Linzie Collin, MD   0.4 mg at 02/26/21 2247     Objective: Vital: Vitals:   02/27/21 0854 02/27/21 0954 02/27/21 1056 02/27/21 1231  BP: (!) 90/48 107/68 (!) 110/40   Pulse: (!) 130 (!) 125 (!) 120   Resp: 19     Temp: 99.6 F (37.6 C)   97.6 F (36.4 C)  TempSrc: Oral   Oral  SpO2:      Weight:      Height:       I/Os: I/O last 3 completed shifts: In: 2127.1  [I.V.:2127.1] Out: 1375 [Urine:1375]  Physical Exam:  General: Patient is in no apparent distress Lungs: Normal respiratory effort, chest expands symmetrically. GI: the abdomen is soft and some tenderness on left without mass. Mild left cva tenderness Urine is a little cloudy and yellow in urinal Ext: lower extremities symmetric  Lab Results: Recent Labs    02/25/21 1741  WBC 22.3*  HGB 12.1  HCT 34.2*   No results for input(s): NA, K, CL, CO2, GLUCOSE, BUN, CREATININE, CALCIUM in the last 72 hours. No results for input(s): LABPT, INR in the last 72 hours. No results for input(s): LABURIN in the last 72 hours. Results for orders placed or performed during the hospital encounter of 02/25/21  Urine Culture     Status: Abnormal (Preliminary result)   Collection Time: 02/25/21  3:57 PM   Specimen: Urine, Random  Result Value Ref Range Status   Specimen Description   Final    URINE, RANDOM Performed at [facility], [address] [place], [place] [address]    Special Requests   Final    NONE Performed at [facility], [address] [place],  [place] [address]    Culture (A)  Final    >=100,000 COLONIES/mL ESCHERICHIA COLI SUSCEPTIBILITIES TO FOLLOW Performed at [facility], [address], [place], [place] [address]    Report Status PENDING  Incomplete  Resp Panel by RT-PCR (Flu A&B, Covid) Nasopharyngeal Swab     Status: None   Collection Time: 02/25/21  4:14 PM   Specimen: Nasopharyngeal Swab; Nasopharyngeal(NP) swabs in vial transport medium  Result Value Ref Range Status   SARS Coronavirus 2 by RT PCR NEGATIVE NEGATIVE Final    Comment: (NOTE) SARS-CoV-2 target nucleic acids are NOT DETECTED.  The SARS-CoV-2 RNA is generally detectable in upper respiratory specimens during the acute phase of infection. The lowest concentration of SARS-CoV-2 viral copies this assay can detect is 138 copies/mL. A negative result does not preclude  SARS-Cov-2 infection and should not be used as the sole basis for treatment or other patient management decisions. A negative result may occur with  improper specimen collection/handling, submission of specimen other than nasopharyngeal swab, presence of viral mutation(s) within the areas targeted by this assay, and inadequate number of viral copies(<138 copies/mL). A negative result must be combined with clinical observations, patient history, and epidemiological information. The expected result is Negative.  Fact Sheet for Patients:  BloggerCourse.com  Fact Sheet for Healthcare Providers:  SeriousBroker.it  This test is no t yet approved or cleared by the [place] FDA and  has been authorized for detection and/or diagnosis of SARS-CoV-2 by FDA under an Emergency Use Authorization (EUA). This EUA will remain  in effect (meaning this test can be used) for the duration of the COVID-19 declaration under Section 564(b)(1) of the Act, 21 U.S.C.section 360bbb-3(b)(1), unless the authorization is terminated  or revoked sooner.       Influenza A by PCR NEGATIVE NEGATIVE Final   Influenza B by PCR NEGATIVE NEGATIVE Final    Comment: (NOTE) The Xpert Xpress SARS-CoV-2/FLU/RSV plus assay is intended as an aid in the diagnosis of influenza from Nasopharyngeal swab specimens and should not be used as a sole basis for treatment. Nasal washings and aspirates are unacceptable for Xpert Xpress SARS-CoV-2/FLU/RSV testing.  Fact Sheet for Patients: BloggerCourse.com  Fact Sheet for Healthcare Providers: SeriousBroker.it  This test is not yet approved or cleared by the [place] FDA and has been authorized for detection and/or diagnosis of SARS-CoV-2 by FDA under an Emergency Use Authorization (EUA). This EUA will remain in effect (meaning this test can be used) for the duration of  the COVID-19 declaration under Section 564(b)(1) of the Act, 21 U.S.C. section 360bbb-3(b)(1), unless the authorization is terminated or revoked.  Performed at [facility], [address] [place] [place], [place] [address]     Studies/Results: US RENAL  Result Date: 02/25/2021 CLINICAL DATA:  35 year old pregnant female with acute LEFT flank pain. History of urinary calculi. EXAM: RENAL / URINARY TRACT ULTRASOUND COMPLETE COMPARISON:  None. FINDINGS: Right Kidney: Renal measurements: 11.8 x 4.8 x 5 cm = volume: 148 mL. Echogenicity within normal limits. Mild hydronephrosis noted. No solid mass noted. Left Kidney: Renal measurements: 12.1 x 6.7 x 5.5 cm = volume: 234 mL. Echogenicity within normal limits. Mild to moderate LEFT hydronephrosis noted. There are at least 2 LEFT renal calculi noted measuring 7 mm, 1 in the interpolar region and 1 within the renal pelvis near the UPJ. No solid mass identified. Bladder: Bladder is not well distended and ureteral jets cannot be assessed. Other: None. IMPRESSION: 1. Mild to moderate  LEFT hydronephrosis of uncertain etiology and may be related to infection, obstruction or pregnancy. 2. LEFT nephrolithiasis. A 7 mm calculus within the LEFT renal pelvis lies near the UPJ. 3. Mild RIGHT hydronephrosis more likely related to pregnancy. Electronically Signed   By: Harmon Pier M.D.   On: 02/25/2021 19:27   DG OR UROLOGY CYSTO IMAGE ([facility] ONLY)  Result Date: 02/26/2021 There is no interpretation for this exam.  This order is for images obtained during a surgical procedure.  Please See "Surgeries" Tab for more information regarding the procedure.    Assessment: Left renal/ureteral calculus Ureteral obstruction secondary to calculus UTI  Procedure(s): CYSTOSCOPY WITH STENT PLACEMENT, 1 Day Post-Op  doing well.  Plan: Continue rocephin until final cultures return. Start PO oxycodone and continue IV morphine for breakthrough. 24 hours of IV tylenol.  Oxybutynin 5mg  TID. Flomax daily   , MD Urology 02/27/2021, 1:21 PM

## 2021-02-27 NOTE — Progress Notes (Addendum)
    Subjective:    Pt continues to experience significant flank pain.  Sitting up rocking in bed.  Occ N/V.  Objective:    Patient Vitals for the past 2 hrs:  BP Temp Temp src Pulse Resp  02/27/21 0854 (!) 90/48 99.6 F (37.6 C) Oral (!) 130 19   Total I/O In: -  Out: 150 [Urine:150]  Labs: No results found for this or any previous visit (from the past 24 hour(s)).  Medications    Current Discharge Medication List     CONTINUE these medications which have NOT CHANGED   Details  famotidine (PEPCID) 40 MG/5ML suspension Take 2.5 mLs (20 mg total) by mouth 2 (two) times daily. Qty: 50 mL, Refills: 0    hydrOXYzine (ATARAX/VISTARIL) 50 MG tablet Take 1 tablet (50 mg total) by mouth every 6 (six) hours as needed for nausea or vomiting. Qty: 30 tablet, Refills: 1    metoCLOPramide (REGLAN) 5 MG/5ML solution Take 10 mLs (10 mg total) by mouth every 6 (six) hours as needed for nausea. Qty: 120 mL, Refills: 1    ondansetron (ZOFRAN-ODT) 4 MG disintegrating tablet Take 1 tablet (4 mg total) by mouth every 8 (eight) hours as needed for nausea or vomiting. Qty: 60 tablet, Refills: 0    Prenatal Vit-Fe Fumarate-FA (MULTIVITAMIN-PRENATAL) 27-0.8 MG TABS tablet Take 1 tablet by mouth daily at 12 noon.    promethazine (PHENERGAN) 6.25 MG/5ML syrup Take 20 mLs (25 mg total) by mouth every 6 (six) hours as needed for nausea or vomiting. Qty: 120 mL, Refills: 1    scopolamine (TRANSDERM-SCOP) 1 MG/3DAYS Place 1 patch (1.5 mg total) onto the skin every 3 (three) days. Qty: 10 patch, Refills: 12          Assessment:    Renal calculi - S/P stent placement.  Continued flank pain  21 wk IUP - normal FHT's  Plan:    Discussed pain management with Dr. Alvester Morin.  Multiple meds changed/ordered.   Discussed use of Oxybutynin with pt (recommended by Dr. Alvester Morin)  R/B discussed with pt.  Will use to try to decrease stent pain.  Today I spent greater than 30 minutes in the direct care of this  patient.  Elonda Husky, M.D. 02/27/2021 10:07 AM

## 2021-02-28 DIAGNOSIS — N201 Calculus of ureter: Secondary | ICD-10-CM | POA: Diagnosis not present

## 2021-02-28 DIAGNOSIS — O2342 Unspecified infection of urinary tract in pregnancy, second trimester: Secondary | ICD-10-CM | POA: Diagnosis not present

## 2021-02-28 DIAGNOSIS — B9620 Unspecified Escherichia coli [E. coli] as the cause of diseases classified elsewhere: Secondary | ICD-10-CM

## 2021-02-28 DIAGNOSIS — Z3A21 21 weeks gestation of pregnancy: Secondary | ICD-10-CM

## 2021-02-28 DIAGNOSIS — N1330 Unspecified hydronephrosis: Secondary | ICD-10-CM

## 2021-02-28 DIAGNOSIS — O99891 Other specified diseases and conditions complicating pregnancy: Secondary | ICD-10-CM | POA: Diagnosis not present

## 2021-02-28 DIAGNOSIS — N200 Calculus of kidney: Secondary | ICD-10-CM | POA: Diagnosis not present

## 2021-02-28 DIAGNOSIS — O26832 Pregnancy related renal disease, second trimester: Secondary | ICD-10-CM | POA: Diagnosis not present

## 2021-02-28 LAB — URINE CULTURE: Culture: >=100000 — AB

## 2021-02-28 MED ORDER — HYDROXYZINE HCL 25 MG PO TABS
25.0000 mg | ORAL_TABLET | Freq: Three times a day (TID) | ORAL | Status: DC | PRN
  Filled 2021-02-28: qty 1

## 2021-02-28 MED ORDER — ACETAMINOPHEN 500 MG PO TABS
1000.0000 mg | ORAL_TABLET | Freq: Four times a day (QID) | ORAL | Status: DC | PRN
  Filled 2021-02-28: qty 2

## 2021-02-28 MED ORDER — ACETAMINOPHEN 160 MG/5ML PO SOLN
1000.0000 mg | Freq: Four times a day (QID) | ORAL | Status: DC | PRN
  Administered 2021-02-28 – 2021-03-02 (×2): 1000 mg via ORAL
  Filled 2021-02-28 (×3): qty 40.6

## 2021-02-28 MED ORDER — BELLADONNA ALKALOIDS-OPIUM 16.2-60 MG RE SUPP
1.0000 | Freq: Three times a day (TID) | RECTAL | Status: DC | PRN
  Administered 2021-02-28 – 2021-03-02 (×6): 1 via RECTAL
  Filled 2021-02-28 (×7): qty 1

## 2021-02-28 MED ORDER — MAGNESIUM HYDROXIDE 400 MG/5ML PO SUSP
30.0000 mL | Freq: Every day | ORAL | Status: DC
  Administered 2021-02-28: 30 mL via ORAL
  Filled 2021-02-28 (×2): qty 30

## 2021-02-28 MED ORDER — DOCUSATE SODIUM 100 MG PO CAPS
100.0000 mg | ORAL_CAPSULE | Freq: Two times a day (BID) | ORAL | Status: DC | PRN
  Administered 2021-03-02 (×2): 100 mg via ORAL
  Filled 2021-02-28 (×2): qty 1

## 2021-02-28 NOTE — Progress Notes (Signed)
Urology Inpatient Progress Note  Subjective: No acute events overnight.  She is afebrile, stably tachycardic, and normotensive this morning. Urine cultures growing E. coli, susceptibilities to follow.  On antibiotics as below. Today she reports continued left-sided abdominal pain, left flank pain, and pelvic pain.  She denies dysuria or gross hematuria.  With her history of hyperemesis during this pregnancy, she is concerned about her ability to tolerate p.o. medications.  Anti-infectives: Anti-infectives (From admission, onward)    Start     Dose/Rate Route Frequency Ordered Stop   02/26/21 1100  cefTRIAXone (ROCEPHIN) 1 g in sodium chloride 0.9 % 100 mL IVPB        1 g 200 mL/hr over 30 Minutes Intravenous Every 24 hours 02/26/21 0949         Current Facility-Administered Medications  Medication Dose Route Frequency Provider Last Rate Last Admin   belladonna-opium (B&O) suppository 16.2-60mg   1 suppository Rectal Q8H PRN Vanna Scotland, MD       cefTRIAXone (ROCEPHIN) 1 g in sodium chloride 0.9 % 100 mL IVPB  1 g Intravenous Q24H Ray Church III, MD   Stopped at 02/27/21 1045   docusate sodium (COLACE) capsule 100 mg  100 mg Oral BID Ray Church III, MD   100 mg at 02/27/21 2218   hydrOXYzine (ATARAX/VISTARIL) tablet 25 mg  25 mg Oral TID PRN Vanna Scotland, MD       lactated ringers infusion   Intravenous Continuous Linzie Collin, MD 125 mL/hr at 02/28/21 0648 New Bag at 02/28/21 0648   lidocaine (LIDODERM) 5 % 1 patch  1 patch Transdermal Q24H Ray Church III, MD   1 patch at 02/27/21 0845   morphine 2 MG/ML injection 2 mg  2 mg Intravenous Q3H PRN Ray Church III, MD   2 mg at 02/28/21 0531   ondansetron (ZOFRAN) injection 4 mg  4 mg Intravenous Q6H PRN Linzie Collin, MD   4 mg at 02/28/21 0217   oxybutynin (DITROPAN) tablet 5 mg  5 mg Oral TID Ray Church III, MD   5 mg at 02/27/21 2218   oxyCODONE (Oxy IR/ROXICODONE) immediate release tablet 5 mg  5 mg  Oral Q3H PRN Ray Church III, MD   5 mg at 02/28/21 0646   tamsulosin (FLOMAX) capsule 0.4 mg  0.4 mg Oral Daily Linzie Collin, MD   0.4 mg at 02/27/21 2218   Objective: Vital signs in last 24 hours: Temp:  [97.6 F (36.4 C)-99.6 F (37.6 C)] 99.2 F (37.3 C) (07/05 0717) Pulse Rate:  [120-140] 140 (07/05 0717) Resp:  [17-22] 22 (07/05 0717) BP: (90-118)/(40-69) 118/69 (07/05 0717)  Intake/Output from previous day: 07/04 0701 - 07/05 0700 In: 1703.5 [I.V.:1403.5; IV Piggyback:300] Out: 1150 [Urine:950; Emesis/NG output:200] Intake/Output this shift: No intake/output data recorded.  Physical Exam Vitals and nursing note reviewed.  Constitutional:      General: She is not in acute distress.    Appearance: She is not ill-appearing, toxic-appearing or diaphoretic.     Comments: Uncomfortable appearing  HENT:     Head: Normocephalic and atraumatic.  Pulmonary:     Effort: Pulmonary effort is normal. No respiratory distress.  Skin:    General: Skin is warm and dry.  Neurological:     Mental Status: She is alert and oriented to person, place, and time.  Psychiatric:        Mood and Affect: Mood normal.  Behavior: Behavior normal.   Lab Results:  Recent Labs    02/25/21 1741  WBC 22.3*  HGB 12.1  HCT 34.2*  PLT 256   Assessment & Plan: 35 year old pregnant female at [redacted]w[redacted]d now POD2 from left ureteral stent placement with Dr. Alvester Morin for management of an obstructing, infected ureteral stone.  Pregnancy complicated by hyperemesis gravidarum.  Patient continues to struggle with pain control following ureteral stent placement.  Dr. Apolinar Junes added on B&O suppositories and hydroxyzine this morning, and okay to also add Ativan if this is insufficient.  She is already on Flomax and oxybutynin, okay to continue these.  Patient will require a total of 10 to 14 days of culture appropriate antibiotics for clearance of UTI.  Ceftriaxone likely appropriate empiric therapy given  E. coli growing on cultures, however we will continue to follow.  With her history of hyperemesis this pregnancy, transitioning to p.o. antibiotics may pose a challenge and will defer to [facility] team for management of nausea/vomiting.  We discussed that she will require outpatient ureteroscopy with laser lithotripsy and stent exchange in 1 to 2 weeks for definitive management of her stone.  She expressed understanding.  Carman Ching, PA-C 02/28/2021

## 2021-02-28 NOTE — Progress Notes (Addendum)
    Subjective:    Patient continues to have significant pain.  States that she again had difficulty sleeping through the night despite multiple pain medications.  "  Cannot get comfortable."  Objective:    Patient Vitals for the past 2 hrs:  BP Temp Temp src Pulse Resp  02/28/21 0717 118/69 99.2 F (37.3 C) Oral (!) 140 (!) 22   No intake/output data recorded.  Labs: No results found for this or any previous visit (from the past 24 hour(s)).  Medications    Current Discharge Medication List     CONTINUE these medications which have NOT CHANGED   Details  famotidine (PEPCID) 40 MG/5ML suspension Take 2.5 mLs (20 mg total) by mouth 2 (two) times daily. Qty: 50 mL, Refills: 0    hydrOXYzine (ATARAX/VISTARIL) 50 MG tablet Take 1 tablet (50 mg total) by mouth every 6 (six) hours as needed for nausea or vomiting. Qty: 30 tablet, Refills: 1    metoCLOPramide (REGLAN) 5 MG/5ML solution Take 10 mLs (10 mg total) by mouth every 6 (six) hours as needed for nausea. Qty: 120 mL, Refills: 1    ondansetron (ZOFRAN-ODT) 4 MG disintegrating tablet Take 1 tablet (4 mg total) by mouth every 8 (eight) hours as needed for nausea or vomiting. Qty: 60 tablet, Refills: 0    Prenatal Vit-Fe Fumarate-FA (MULTIVITAMIN-PRENATAL) 27-0.8 MG TABS tablet Take 1 tablet by mouth daily at 12 noon.    promethazine (PHENERGAN) 6.25 MG/5ML syrup Take 20 mLs (25 mg total) by mouth every 6 (six) hours as needed for nausea or vomiting. Qty: 120 mL, Refills: 1    scopolamine (TRANSDERM-SCOP) 1 MG/3DAYS Place 1 patch (1.5 mg total) onto the skin every 3 (three) days. Qty: 10 patch, Refills: 12          Assessment:    Renal calculi -stent in place -ineffective pain management  Pyelonephritis -on antibiotics   Plan:    Discussed patient with Dr. Apolinar Junes.  To reevaluate pain medication and update patient on status and plan.   I spent 16 minutes in direct care of this patient today.  Elonda Husky,  M.D. 02/28/2021 8:01 AM

## 2021-02-28 NOTE — Progress Notes (Signed)
  Antenatal Progress Note  Subjective:     Patient ID: Laura Sosa is a 35 y.o. female [redacted]w[redacted]d, Estimated Date of Delivery: 07/05/21 dated by first trimester ultrasound. She was admitted for infected ureteral stone on left.  HD# 3. POD#2 s/p left ureteral stent placement.  Subjective:  Patient reports that her pain has improved some with new medication regimen.  Is utilizing medications scheduled.   Review of Systems Denies contractions, leakage of fluids, vaginal bleeding, and reports good fetal movement.     Objective:   Vitals:   02/28/21 0717 02/28/21 0913 02/28/21 1008 02/28/21 1453  BP: 118/69 106/64 104/62 (!) 91/53  Pulse: (!) 140 (!) 120 (!) 117 (!) 123  Resp: (!) 22 18 18 20   Temp: 99.2 F (37.3 C) 99.4 F (37.4 C) 99.5 F (37.5 C) 99.2 F (37.3 C)  TempSrc: Oral Oral Oral Oral  SpO2:  99% 100% 100%  Weight:      Height:        Scheduled Meds:  docusate sodium  100 mg Oral BID   lidocaine  1 patch Transdermal Q24H   oxybutynin  5 mg Oral TID   tamsulosin  0.4 mg Oral Daily   Continuous Infusions:  cefTRIAXone (ROCEPHIN)  IV 1 g (02/28/21 1013)   lactated ringers 125 mL/hr at 02/28/21 1530   PRN Meds:.acetaminophen, opium-belladonna, hydrOXYzine, morphine injection, ondansetron (ZOFRAN) IV, oxyCODONE  General appearance: alert and moderate distress Lungs: clear to auscultation bilaterally Heart:  regular rhythm, tachycardic. Abdomen: soft, non-tender; bowel sounds normal; no masses,  no organomegaly Pelvic: deferred Extremities: extremities normal, atraumatic, no cyanosis or edema. SCDs in place.   FHT: 146 bpm   Assessment:  35 y.o. female [redacted]w[redacted]d, with:    Infected ureteral calculus s/p left ureteral stent placement Pregnancy at [redacted] weeks gestation   Plan:   Infected ureteral calculus, s/p stent placement (left) Continue current antibiotic regimen Continue pain management as written by Urology.Patient desires scheduled medications instead of  prn as she notes her pain becomes significantly worse after skipping doses.  Plans for definitive management of stone with ureteroscopy with laser lithotripsy and stent removal underway for early next week.  Pregnancy at 21 weeks Continue q shift FHT Changed bowel management to daily milk of magnesia and colace prn.  Patient has a h/o chronic mild constipation and will likely worsen with use of narcotic pain meds.  SCDs for DVT prophylaxis  Will attempt to schedule anatomy scan once patient's pain has decreased.  Continue prenatal vitamins Low-fat diet.    A total of 15 minutes were spent face-to-face with the patient during this encounter.  [facility] 02/28/2021 7:19 PM

## 2021-03-01 DIAGNOSIS — N1330 Unspecified hydronephrosis: Secondary | ICD-10-CM | POA: Diagnosis not present

## 2021-03-01 DIAGNOSIS — O210 Mild hyperemesis gravidarum: Secondary | ICD-10-CM

## 2021-03-01 DIAGNOSIS — O99891 Other specified diseases and conditions complicating pregnancy: Secondary | ICD-10-CM | POA: Diagnosis not present

## 2021-03-01 DIAGNOSIS — O2342 Unspecified infection of urinary tract in pregnancy, second trimester: Secondary | ICD-10-CM | POA: Diagnosis not present

## 2021-03-01 DIAGNOSIS — O26832 Pregnancy related renal disease, second trimester: Secondary | ICD-10-CM | POA: Diagnosis not present

## 2021-03-01 DIAGNOSIS — Z3A21 21 weeks gestation of pregnancy: Secondary | ICD-10-CM | POA: Diagnosis not present

## 2021-03-01 DIAGNOSIS — B9620 Unspecified Escherichia coli [E. coli] as the cause of diseases classified elsewhere: Secondary | ICD-10-CM | POA: Diagnosis not present

## 2021-03-01 DIAGNOSIS — N200 Calculus of kidney: Secondary | ICD-10-CM | POA: Diagnosis not present

## 2021-03-01 DIAGNOSIS — N201 Calculus of ureter: Secondary | ICD-10-CM | POA: Diagnosis not present

## 2021-03-01 NOTE — Progress Notes (Signed)
Urology Consult Follow Up  Subjective: States pain was better controlled throughout the night.  Afebrile, stable tachycardia and normotensive this morning.  Good urine output without complaints of dysuria or gross hematuria.  Continues to be nauseous and experienced 2 episodes of emesis yesterday.  Urine culture positive for pan sensitive E.coli.   Anti-infectives: Anti-infectives (From admission, onward)    Start     Dose/Rate Route Frequency Ordered Stop   02/26/21 1100  cefTRIAXone (ROCEPHIN) 1 g in sodium chloride 0.9 % 100 mL IVPB        1 g 200 mL/hr over 30 Minutes Intravenous Every 24 hours 02/26/21 0949         Current Facility-Administered Medications  Medication Dose Route Frequency Provider Last Rate Last Admin   acetaminophen (TYLENOL) 160 MG/5ML solution 1,000 mg  1,000 mg Oral Q6H PRN Hildred Laser, MD   1,000 mg at 02/28/21 1944   belladonna-opium (B&O) suppository 16.2-60mg   1 suppository Rectal Q8H PRN Vanna Scotland, MD   1 suppository at 03/01/21 0407   cefTRIAXone (ROCEPHIN) 1 g in sodium chloride 0.9 % 100 mL IVPB  1 g Intravenous Q24H Ray Church III, MD 200 mL/hr at 02/28/21 1013 1 g at 02/28/21 1013   docusate sodium (COLACE) capsule 100 mg  100 mg Oral BID PRN Hildred Laser, MD       hydrOXYzine (ATARAX/VISTARIL) tablet 25 mg  25 mg Oral TID PRN Vanna Scotland, MD       lactated ringers infusion   Intravenous Continuous Linzie Collin, MD 125 mL/hr at 02/28/21 2305 New Bag at 02/28/21 2305   lidocaine (LIDODERM) 5 % 1 patch  1 patch Transdermal Q24H Ray Church III, MD   1 patch at 02/28/21 9528   magnesium hydroxide (MILK OF MAGNESIA) suspension 30 mL  30 mL Oral Daily Hildred Laser, MD   30 mL at 02/28/21 2157   morphine 2 MG/ML injection 2 mg  2 mg Intravenous Q3H PRN Ray Church III, MD   2 mg at 03/01/21 0223   ondansetron (ZOFRAN) injection 4 mg  4 mg Intravenous Q6H PRN Linzie Collin, MD   4 mg at 03/01/21 0603   oxybutynin  (DITROPAN) tablet 5 mg  5 mg Oral TID Ray Church III, MD   5 mg at 03/01/21 0102   oxyCODONE (Oxy IR/ROXICODONE) immediate release tablet 5 mg  5 mg Oral Q3H PRN Ray Church III, MD   5 mg at 03/01/21 0603   tamsulosin (FLOMAX) capsule 0.4 mg  0.4 mg Oral Daily Linzie Collin, MD   0.4 mg at 02/28/21 2157     Objective: Vital signs in last 24 hours: Temp:  [98.2 F (36.8 C)-100.1 F (37.8 C)] 98.7 F (37.1 C) (07/06 0403) Pulse Rate:  [99-123] 122 (07/06 0403) Resp:  [18-24] 24 (07/06 0403) BP: (91-122)/(53-73) 116/73 (07/06 0403) SpO2:  [93 %-100 %] 98 % (07/06 0403)  Intake/Output from previous day: 07/05 0701 - 07/06 0700 In: 3586.9 [I.V.:3586.9] Out: 2200 [Urine:2200] Intake/Output this shift: No intake/output data recorded.   Physical Exam Constitutional:  Well nourished. Alert and oriented, No acute distress. HEENT: [place] AT, moist mucus membranes.  Trachea midline, no masses. Cardiovascular: No clubbing, cyanosis, or edema. Respiratory: Normal respiratory effort, no increased work of breathing. Neurologic: Grossly intact, no focal deficits, moving all 4 extremities. Psychiatric: Normal mood and affect.    Lab Results:  No results for input(s): WBC, HGB, HCT, PLT in the last  72 hours. BMET No results for input(s): NA, K, CL, CO2, GLUCOSE, BUN, CREATININE, CALCIUM in the last 72 hours. PT/INR No results for input(s): LABPROT, INR in the last 72 hours. ABG No results for input(s): PHART, HCO3 in the last 72 hours.  Invalid input(s): PCO2, PO2  Studies/Results: No results found.   Assessment and Plan: 35 year old gravid female (21 weeks, 7 days) now POD 3 from left ureteral stent placement for management of an obstructing 7 mm infected ureteral stone.  Pregnancy is complicated by hyperemesis gravidarum.  Her pain is better controlled with a scheduled regimen of Zofran prn, acetaminophen prn, B&O suppositories prn, tamsulosin qd, oxybutynin tid,  hydroxyzine prn, morphine prn, lidocaine patch q 24 hours and oxycodone prn.  She will require a total 10 to 14 days of culture appropriate antibiotics for which transferring to oral antibiotics will be challenging due to her nausea/vomiting.  Plans are to schedule her for ureteroscopy with laser lithotripsy and stent exchange early next week in hopes to expedite stent removal given her overall poor tolerance.    LOS: 4 days    [facility] 03/01/2021

## 2021-03-01 NOTE — Progress Notes (Signed)
Pt stated it is hard for her to take deep breaths and that she feels like she can hear herself wheezing. RN listened to patient's lung sounds again and heard fine crackles on both sides when listening to the lungs on the patient's back. Encouraged pt to cough a few times, patient's crackles improved. Encouraged pt to keep coughing, and gave her an incentive spirometer. Instructed pt on how to use and instructed her to do it several times an hour. Patient was only able to reach the 500 mark on the incentive spirometer. Will continue to monitor.

## 2021-03-01 NOTE — Progress Notes (Signed)
Patient ID: Laura Sosa, female   DOB: 1985/09/08, 35 y.o.   MRN: [id number]    Subjective:    Patient seems more comfortable this morning.  She says she got 3 separate hours (1 hour stretch) of sleep last night.  She reports that the pain is a little bit better controlled than it has been.  She describes belladonna is working the best. We discussed the overall plan for her stone and stents and she seems knowledgeable on the subject and aware of the future planning.  Objective:    Patient Vitals for the past 2 hrs:  BP Temp Temp src Pulse Resp SpO2  03/01/21 0746 109/74 99.7 F (37.6 C) Oral (!) 121 (!) 22 96 %   Total I/O In: -  Out: 300 [Urine:300]  Labs: No results found for this or any previous visit (from the past 24 hour(s)).  Medications    Current Discharge Medication List     CONTINUE these medications which have NOT CHANGED   Details  famotidine (PEPCID) 40 MG/5ML suspension Take 2.5 mLs (20 mg total) by mouth 2 (two) times daily. Qty: 50 mL, Refills: 0    hydrOXYzine (ATARAX/VISTARIL) 50 MG tablet Take 1 tablet (50 mg total) by mouth every 6 (six) hours as needed for nausea or vomiting. Qty: 30 tablet, Refills: 1    metoCLOPramide (REGLAN) 5 MG/5ML solution Take 10 mLs (10 mg total) by mouth every 6 (six) hours as needed for nausea. Qty: 120 mL, Refills: 1    ondansetron (ZOFRAN-ODT) 4 MG disintegrating tablet Take 1 tablet (4 mg total) by mouth every 8 (eight) hours as needed for nausea or vomiting. Qty: 60 tablet, Refills: 0    Prenatal Vit-Fe Fumarate-FA (MULTIVITAMIN-PRENATAL) 27-0.8 MG TABS tablet Take 1 tablet by mouth daily at 12 noon.    promethazine (PHENERGAN) 6.25 MG/5ML syrup Take 20 mLs (25 mg total) by mouth every 6 (six) hours as needed for nausea or vomiting. Qty: 120 mL, Refills: 1    scopolamine (TRANSDERM-SCOP) 1 MG/3DAYS Place 1 patch (1.5 mg total) onto the skin every 3 (three) days. Qty: 10 patch, Refills: 12           Assessment:    Renal calculi -stent in place  - patient on multiple medications for pain relief but more comfortable today than over the last few days.  P pyelonephritis -continues on IV antibiotics.  Plan:    Continue pain relief measures -possibly consider PCA.  Continue IV antibiotics  Possible lithotripsy early next week.  Today I spent greater than 15 minutes in the direct care of this patient  Elonda Husky, M.D. 03/01/2021 8:18 AM

## 2021-03-02 ENCOUNTER — Inpatient Hospital Stay: Payer: Medicaid Other

## 2021-03-02 ENCOUNTER — Other Ambulatory Visit: Payer: Self-pay

## 2021-03-02 DIAGNOSIS — N200 Calculus of kidney: Secondary | ICD-10-CM | POA: Diagnosis not present

## 2021-03-02 DIAGNOSIS — O321XX0 Maternal care for breech presentation, not applicable or unspecified: Secondary | ICD-10-CM | POA: Diagnosis not present

## 2021-03-02 DIAGNOSIS — Z3A21 21 weeks gestation of pregnancy: Secondary | ICD-10-CM | POA: Diagnosis not present

## 2021-03-02 DIAGNOSIS — O26832 Pregnancy related renal disease, second trimester: Secondary | ICD-10-CM | POA: Diagnosis not present

## 2021-03-02 DIAGNOSIS — Z3A22 22 weeks gestation of pregnancy: Secondary | ICD-10-CM | POA: Diagnosis not present

## 2021-03-02 MED ORDER — OXYCODONE HCL 5 MG PO TABS
5.0000 mg | ORAL_TABLET | ORAL | 0 refills | Status: DC | PRN
  Filled 2021-03-02: qty 30, 4d supply, fill #0

## 2021-03-02 MED ORDER — TAMSULOSIN HCL 0.4 MG PO CAPS
0.4000 mg | ORAL_CAPSULE | Freq: Every day | ORAL | 0 refills | Status: DC
  Filled 2021-03-02: qty 7, 7d supply, fill #0

## 2021-03-02 MED ORDER — OXYBUTYNIN CHLORIDE 5 MG PO TABS
5.0000 mg | ORAL_TABLET | Freq: Three times a day (TID) | ORAL | 0 refills | Status: DC
  Filled 2021-03-02: qty 20, 7d supply, fill #0

## 2021-03-02 NOTE — Progress Notes (Signed)
Pt discharged home. Discharge instructions, prescriptions, and follow up appointments given to and reviewed with pt. Pt verbalized understanding. Escorted by staff.   

## 2021-03-02 NOTE — Progress Notes (Signed)
RN gave medications from the meds to beds up to patient.

## 2021-03-02 NOTE — Discharge Summary (Signed)
Discharge Summary  Admit date: 02/25/2021  Discharge Date and Time:03/02/2021  4:16 PM  Discharge to:  Home  Admission Diagnosis: Present on Admission:  History of pyelonephritis  History of kidney stones                     Discharge  Diagnoses: Principal Problem:   Renal calculus, left Active Problems:   History of pyelonephritis   History of kidney stones   Pregnancy   OR Procedures:   Procedure(s): CYSTOSCOPY WITH STENT PLACEMENT Date -------------------  Procedure(s): CYSTOSCOPY WITH STENT PLACEMENT Date -------------------                              Discharge Day Progress Note:   Subjective:   Significant improvement today.  Patient states pain is much better controlled.  She says she feels like a "new person".  No longer using rectal belladonna.  Taking p.o. without difficulty.   Objective:  BP 109/67 (BP Location: Left Arm)   Pulse (!) 109   Temp 98.6 F (37 C) (Oral)   Resp 17   Ht 5\' 3"  (1.6 m)   Wt 76.2 kg Comment: 168lbs  LMP  (LMP Unknown)   SpO2 94%   BMI 29.76 kg/m    Normal fetal heart tones for 22 weeks                            Assessment:   Renal calculi -stent in place -pain now controlled   Pyelonephritis secondary to renal calculi/obstruction -patient afebrile on antibiotics  Plan:        Discharge home.                       Macrobid for bilateral with urology   Continue pain management using oxybutynin, oxycodone   Flomax   Zofran   Follow-up Monday with urology for definitive management/lithotripsy  Hospital Course:  No notes on file   Condition at Discharge:  good Discharge Medications:  Allergies as of 03/02/2021   No Known Allergies      Medication List     STOP taking these medications    famotidine 40 MG/5ML suspension Commonly known as: PEPCID   hydrOXYzine 50 MG tablet Commonly known as: ATARAX/VISTARIL   metoCLOPramide 5 MG/5ML solution Commonly known as: REGLAN   promethazine 6.25  MG/5ML syrup Commonly known as: PHENERGAN   scopolamine 1 MG/3DAYS Commonly known as: TRANSDERM-SCOP       TAKE these medications    multivitamin-prenatal 27-0.8 MG Tabs tablet Take 1 tablet by mouth daily at 12 noon.   ondansetron 4 MG disintegrating tablet Commonly known as: ZOFRAN-ODT Take 1 tablet (4 mg total) by mouth every 8 (eight) hours as needed for nausea or vomiting.   oxybutynin 5 MG tablet Commonly known as: DITROPAN Take 1 tablet (5 mg total) by mouth 3 (three) times daily.   oxyCODONE 5 MG immediate release tablet Commonly known as: Oxy IR/ROXICODONE Take 1 tablet (5 mg total) by mouth every 3 (three) hours as needed for moderate pain or severe pain.   tamsulosin 0.4 MG Caps capsule Commonly known as: FLOMAX Take 1 capsule (0.4 mg total) by mouth daily.         Follow Up:    Follow-up Information     05/03/2021, MD. Go on 03/09/2021.   Specialties:  Obstetrics and Gynecology, Radiology Why: Hospital follow up appointment July 14th at 11:30am Contact information: [address] [place] [address] [phone]               Time spent today in the direct care of this patient including coordination with urology and discharge -40 minutes  Elonda Husky, M.D. 03/02/2021 4:16 PM

## 2021-03-03 ENCOUNTER — Ambulatory Visit: Payer: Medicaid Other

## 2021-03-04 ENCOUNTER — Other Ambulatory Visit: Payer: Self-pay

## 2021-03-04 ENCOUNTER — Inpatient Hospital Stay
Admission: EM | Admit: 2021-03-04 | Discharge: 2021-03-08 | DRG: 818 | Disposition: A | Discharge status: Home / Self Care | Payer: Medicaid Other | Attending: Obstetrics and Gynecology | Admitting: Obstetrics and Gynecology

## 2021-03-04 ENCOUNTER — Encounter: Payer: Self-pay | Admitting: Emergency Medicine

## 2021-03-04 ENCOUNTER — Inpatient Hospital Stay: Payer: Medicaid Other

## 2021-03-04 DIAGNOSIS — T83122A Displacement of urinary stent, initial encounter: Secondary | ICD-10-CM

## 2021-03-04 DIAGNOSIS — Z87448 Personal history of other diseases of urinary system: Secondary | ICD-10-CM | POA: Diagnosis present

## 2021-03-04 DIAGNOSIS — O99282 Endocrine, nutritional and metabolic diseases complicating pregnancy, second trimester: Secondary | ICD-10-CM | POA: Diagnosis not present

## 2021-03-04 DIAGNOSIS — Z87442 Personal history of urinary calculi: Secondary | ICD-10-CM | POA: Diagnosis not present

## 2021-03-04 DIAGNOSIS — N1330 Unspecified hydronephrosis: Secondary | ICD-10-CM | POA: Diagnosis not present

## 2021-03-04 DIAGNOSIS — N200 Calculus of kidney: Secondary | ICD-10-CM | POA: Diagnosis not present

## 2021-03-04 DIAGNOSIS — O26832 Pregnancy related renal disease, second trimester: Secondary | ICD-10-CM | POA: Diagnosis not present

## 2021-03-04 DIAGNOSIS — Z20822 Contact with and (suspected) exposure to covid-19: Secondary | ICD-10-CM | POA: Diagnosis present

## 2021-03-04 DIAGNOSIS — O2343 Unspecified infection of urinary tract in pregnancy, third trimester: Secondary | ICD-10-CM | POA: Diagnosis not present

## 2021-03-04 DIAGNOSIS — N390 Urinary tract infection, site not specified: Secondary | ICD-10-CM | POA: Diagnosis not present

## 2021-03-04 DIAGNOSIS — N202 Calculus of kidney with calculus of ureter: Secondary | ICD-10-CM | POA: Diagnosis not present

## 2021-03-04 DIAGNOSIS — O219 Vomiting of pregnancy, unspecified: Secondary | ICD-10-CM | POA: Diagnosis present

## 2021-03-04 DIAGNOSIS — R109 Unspecified abdominal pain: Secondary | ICD-10-CM | POA: Diagnosis not present

## 2021-03-04 DIAGNOSIS — O99012 Anemia complicating pregnancy, second trimester: Secondary | ICD-10-CM | POA: Diagnosis present

## 2021-03-04 DIAGNOSIS — N201 Calculus of ureter: Secondary | ICD-10-CM | POA: Diagnosis not present

## 2021-03-04 DIAGNOSIS — O2342 Unspecified infection of urinary tract in pregnancy, second trimester: Secondary | ICD-10-CM | POA: Diagnosis not present

## 2021-03-04 DIAGNOSIS — Z3A23 23 weeks gestation of pregnancy: Secondary | ICD-10-CM

## 2021-03-04 DIAGNOSIS — O99891 Other specified diseases and conditions complicating pregnancy: Secondary | ICD-10-CM | POA: Diagnosis present

## 2021-03-04 DIAGNOSIS — O26892 Other specified pregnancy related conditions, second trimester: Secondary | ICD-10-CM | POA: Diagnosis not present

## 2021-03-04 DIAGNOSIS — Z3A22 22 weeks gestation of pregnancy: Secondary | ICD-10-CM

## 2021-03-04 DIAGNOSIS — B9620 Unspecified Escherichia coli [E. coli] as the cause of diseases classified elsewhere: Secondary | ICD-10-CM | POA: Diagnosis not present

## 2021-03-04 DIAGNOSIS — E876 Hypokalemia: Secondary | ICD-10-CM | POA: Diagnosis present

## 2021-03-04 DIAGNOSIS — R079 Chest pain, unspecified: Secondary | ICD-10-CM | POA: Diagnosis not present

## 2021-03-04 DIAGNOSIS — Z960 Presence of urogenital implants: Secondary | ICD-10-CM | POA: Diagnosis not present

## 2021-03-04 HISTORY — DX: Calculus of kidney: N20.0

## 2021-03-04 LAB — URINALYSIS, COMPLETE (UACMP) WITH MICROSCOPIC
Bacteria, UA: NONE SEEN
Bilirubin Urine: NEGATIVE
Glucose, UA: NEGATIVE mg/dL
Ketones, ur: 5 mg/dL — AB
Nitrite: NEGATIVE
Protein, ur: 30 mg/dL — AB
Specific Gravity, Urine: 1.009 (ref 1.005–1.030)
pH: 7 (ref 5.0–8.0)

## 2021-03-04 LAB — BASIC METABOLIC PANEL
Anion gap: 9 (ref 5–15)
BUN: 6 mg/dL (ref 6–20)
CO2: 24 mmol/L (ref 22–32)
Calcium: 7.6 mg/dL — ABNORMAL LOW (ref 8.9–10.3)
Chloride: 99 mmol/L (ref 98–111)
Creatinine, Ser: 0.66 mg/dL (ref 0.44–1.00)
GFR, Estimated: >60 mL/min (ref >60)
Glucose, Bld: 94 mg/dL (ref 70–99)
Potassium: 2.4 mmol/L — CL (ref 3.5–5.1)
Sodium: 132 mmol/L — ABNORMAL LOW (ref 135–145)

## 2021-03-04 LAB — RESP PANEL BY RT-PCR (FLU A&B, COVID) ARPGX2
Influenza A by PCR: NEGATIVE
Influenza B by PCR: NEGATIVE
SARS Coronavirus 2 by RT PCR: NEGATIVE

## 2021-03-04 LAB — CBC
HCT: 27 % — ABNORMAL LOW (ref 36.0–46.0)
Hemoglobin: 9.5 g/dL — ABNORMAL LOW (ref 12.0–15.0)
MCH: 31 pg (ref 26.0–34.0)
MCHC: 35.2 g/dL (ref 30.0–36.0)
MCV: 88.2 fL (ref 80.0–100.0)
Platelets: 291 10*3/uL (ref 150–400)
RBC: 3.06 MIL/uL — ABNORMAL LOW (ref 3.87–5.11)
RDW: 13.6 % (ref 11.5–15.5)
WBC: 15.8 10*3/uL — ABNORMAL HIGH (ref 4.0–10.5)
nRBC: 0 % (ref 0.0–0.2)

## 2021-03-04 LAB — TYPE AND SCREEN
ABO/RH(D): A POS
Antibody Screen: NEGATIVE

## 2021-03-04 LAB — MAGNESIUM: Magnesium: 1.9 mg/dL (ref 1.7–2.4)

## 2021-03-04 MED ORDER — OXYCODONE HCL 5 MG PO TABS
5.0000 mg | ORAL_TABLET | ORAL | Status: DC | PRN
  Administered 2021-03-04 – 2021-03-07 (×17): 5 mg via ORAL
  Filled 2021-03-04 (×18): qty 1

## 2021-03-04 MED ORDER — PRENATAL PLUS 27-1 MG PO TABS
1.0000 | ORAL_TABLET | Freq: Every day | ORAL | Status: DC
  Administered 2021-03-07: 1 via ORAL
  Filled 2021-03-04 (×3): qty 1

## 2021-03-04 MED ORDER — DOCUSATE SODIUM 100 MG PO CAPS
100.0000 mg | ORAL_CAPSULE | Freq: Every day | ORAL | Status: DC
  Administered 2021-03-05: 100 mg via ORAL
  Filled 2021-03-04 (×2): qty 1

## 2021-03-04 MED ORDER — ACETAMINOPHEN 325 MG PO TABS
650.0000 mg | ORAL_TABLET | ORAL | Status: DC | PRN
  Administered 2021-03-06 – 2021-03-08 (×6): 650 mg via ORAL
  Filled 2021-03-04 (×6): qty 2

## 2021-03-04 MED ORDER — CALCIUM CARBONATE ANTACID 500 MG PO CHEW: 2.0000 | CHEWABLE_TABLET | ORAL | Status: DC | PRN

## 2021-03-04 MED ORDER — LACTATED RINGERS IV SOLN: INTRAVENOUS | Status: DC

## 2021-03-04 MED ORDER — ZOLPIDEM TARTRATE 5 MG PO TABS: 5.0000 mg | ORAL_TABLET | Freq: Every evening | ORAL | Status: DC | PRN

## 2021-03-04 MED ORDER — POTASSIUM CHLORIDE 10 MEQ/100ML IV SOLN
10.0000 meq | INTRAVENOUS | Status: AC
  Administered 2021-03-04 (×4): 10 meq via INTRAVENOUS
  Filled 2021-03-04 (×4): qty 100

## 2021-03-04 MED ORDER — MORPHINE SULFATE (PF) 4 MG/ML IV SOLN
4.0000 mg | Freq: Once | INTRAVENOUS | Status: AC
  Administered 2021-03-04: 4 mg via INTRAVENOUS
  Filled 2021-03-04: qty 1

## 2021-03-04 MED ORDER — ONDANSETRON 4 MG PO TBDP
4.0000 mg | ORAL_TABLET | Freq: Three times a day (TID) | ORAL | Status: DC | PRN
  Administered 2021-03-04 – 2021-03-06 (×5): 4 mg via ORAL
  Filled 2021-03-04 (×5): qty 1

## 2021-03-04 MED ORDER — SODIUM CHLORIDE 0.9 % IV SOLN
1.0000 g | Freq: Once | INTRAVENOUS | Status: AC
  Administered 2021-03-04: 1 g via INTRAVENOUS
  Filled 2021-03-04: qty 10

## 2021-03-04 MED ORDER — CALCIUM CARBONATE ANTACID 500 MG PO CHEW
800.0000 mg | CHEWABLE_TABLET | Freq: Two times a day (BID) | ORAL | Status: DC
  Administered 2021-03-05 – 2021-03-08 (×5): 800 mg via ORAL
  Filled 2021-03-04 (×5): qty 4

## 2021-03-04 MED ORDER — OXYBUTYNIN CHLORIDE 5 MG PO TABS
5.0000 mg | ORAL_TABLET | Freq: Three times a day (TID) | ORAL | Status: DC
  Administered 2021-03-04 – 2021-03-06 (×8): 5 mg via ORAL
  Filled 2021-03-04 (×12): qty 1

## 2021-03-04 MED ORDER — TAMSULOSIN HCL 0.4 MG PO CAPS
0.4000 mg | ORAL_CAPSULE | Freq: Every day | ORAL | Status: DC
  Administered 2021-03-04 – 2021-03-06 (×3): 0.4 mg via ORAL
  Filled 2021-03-04 (×5): qty 1

## 2021-03-04 MED ORDER — HYDROXYZINE HCL 25 MG PO TABS: 25.0000 mg | ORAL_TABLET | Freq: Three times a day (TID) | ORAL | Status: DC | PRN

## 2021-03-04 MED ORDER — MORPHINE SULFATE (PF) 2 MG/ML IV SOLN
2.0000 mg | INTRAVENOUS | Status: DC | PRN
  Administered 2021-03-04 – 2021-03-07 (×18): 2 mg via INTRAVENOUS
  Filled 2021-03-04 (×19): qty 1

## 2021-03-04 MED ORDER — SODIUM CHLORIDE 0.9 % IV BOLUS
1000.0000 mL | Freq: Once | INTRAVENOUS | Status: AC
  Administered 2021-03-04: 1000 mL via INTRAVENOUS

## 2021-03-04 MED ORDER — BELLADONNA ALKALOIDS-OPIUM 16.2-60 MG RE SUPP
1.0000 | Freq: Three times a day (TID) | RECTAL | Status: DC | PRN
Start: 2021-03-04 — End: 2021-03-08
  Administered 2021-03-04 – 2021-03-08 (×7): 1 via RECTAL
  Filled 2021-03-04 (×7): qty 1

## 2021-03-04 NOTE — ED Triage Notes (Signed)
Pt reports is [redacted] weeks pregnant and has stents placed for kidney stones on Monday. Pt reports continues to have pain to left flank and back. Pt reports has lithotripsy and stent removal scheduled for Monday.

## 2021-03-04 NOTE — H&P (Signed)
Obstetric History and Physical  Laura Sosa is a 35 y.o. [phone] with IUP at 7914w3d presenting from the Emergency Room due to complaints of recurring left flank plain.  She has a history of kidney stones, currently with left-sided stone (7 mm), E. Coli UTI, s/p left ureteral stent placement (POD#6) during recent admission from 7/2 - 7/7. She also has been noting low-grade temperatures at home (99.7 was Tmax). Has had difficulty taking her oral antibiotics this morning due to "chest tightness".  Patient denies contractions, vaginal bleeding, ruptured membranes, and endorses fetal movement.     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   Infertility, female 12/18/2017   History of pyelonephritis 12/18/2017   History of kidney stones 12/18/2017   Rubella non-immune status, antepartum 12/18/2017   FHx: cleft palate 12/18/2017    Past Medical History:  Diagnosis Date   Bilateral kidney stones    Headache    History of kidney stones    Migraine    Pyelonephritis 04/17/2016    Past Surgical History:  Procedure Laterality Date   COLPOSCOPY     CYSTOSCOPY WITH STENT PLACEMENT Left 02/26/2021   Procedure: CYSTOSCOPY WITH STENT PLACEMENT;  Surgeon: Crista ElliotBell, Eugene D III, MD;  Location: [facility] ORS;  Service: Urology;  Laterality: Left;   CYSTOSCOPY/RETROGRADE/URETEROSCOPY  03/15/2017   Procedure: CYSTOSCOPY/RETROGRADE/URETEROSCOPY;  Surgeon: Hildred LaserBudzyn, Brian James, MD;  Location: [facility];  Service: Urology;;   LEEP      OB History  Gravida Para Term Preterm AB Living  3 2 2     2   SAB IAB Ectopic Multiple Live Births        0 2    # Outcome Date GA Lbr Len/2nd Weight Sex Delivery Anes PTL Lv  3 Current           2 Term 06/17/18 1550w2d / 00:29 2780 g M Vag-Spont EPI   LIV  1 Term 2011   3799 g F Vag-Spont   LIV    Obstetric Comments  G1 - diagnosed with cleft palate at age 89    Social History   Socioeconomic History   Marital status: Single    Spouse name: Not on file   Number of children: Not on file   Years of education: Not on file   Highest education level: Not on file  Occupational History   Not on file  Tobacco Use   Smoking status: Never   Smokeless tobacco: Never  Vaping Use   Vaping Use: Never used  Substance and Sexual Activity   Alcohol use: No   Drug use: No   Sexual activity: Yes  Other Topics Concern   Not on file  Social History Narrative   Not on file   Social Determinants of Health   Financial Resource Strain: Not on file  Food Insecurity: Not on file  Transportation Needs: Not on file  Physical Activity: Not on file  Stress: Not on file  Social Connections: Not on file    Family History  Problem Relation Age of Onset   Hypertension Mother    Diabetes Mother    Diabetes Father    Hypertension Father    Emphysema Maternal Grandmother    Lung cancer Maternal Grandmother    Asthma Daughter    Hypertension Paternal Grandmother  Heart defect Half-Sister        "boot shaped heart"   Kidney cancer Neg Hx    Bladder Cancer Neg Hx    Breast cancer Neg Hx    Ovarian cancer Neg Hx    Colon cancer Neg Hx     Medications Prior to Admission  Medication Sig Dispense Refill Last Dose   ondansetron (ZOFRAN-ODT) 4 MG disintegrating tablet Take 1 tablet (4 mg total) by mouth every 8 (eight) hours as needed for nausea or vomiting. 60 tablet 0 03/03/2021 at unk   oxybutynin (DITROPAN) 5 MG tablet Take 1 tablet (5 mg total) by mouth 3 (three) times daily. 20 tablet 0 03/03/2021 at unk   oxyCODONE (OXY IR/ROXICODONE) 5 MG immediate release tablet Take 1 tablet (5 mg total) by mouth every 3 (three) hours as needed for moderate pain or severe pain. 30 tablet 0 03/03/2021 at unk   Prenatal Vit-Fe Fumarate-FA  (MULTIVITAMIN-PRENATAL) 27-0.8 MG TABS tablet Take 1 tablet by mouth daily at 12 noon.   03/03/2021 at unk   tamsulosin (FLOMAX) 0.4 MG CAPS capsule Take 1 capsule (0.4 mg total) by mouth daily. 7 capsule 0 03/03/2021 at unk    No Known Allergies  Review of Systems: Negative except for what is mentioned in HPI.  Physical Exam: BP 103/68 (BP Location: Right Arm)   Pulse 86   Temp 98.2 F (36.8 C) (Oral)   Resp 18   Ht 5\' 3"  (1.6 m)   Wt 74.8 kg   LMP  (LMP Unknown)   SpO2 95%   BMI 29.23 kg/m  CONSTITUTIONAL: Well-developed, well-nourished female in mild distress.  HENT:  Normocephalic, atraumatic, External right and left ear normal. Oropharynx is clear and moist EYES: Conjunctivae and EOM are normal. Pupils are equal, round, and reactive to light. No scleral icterus.  NECK: Normal range of motion, supple, no masses SKIN: Skin is warm and dry. No rash noted. Not diaphoretic. No erythema. No pallor. NEUROLOGIC: Alert and oriented to person, place, and time. Normal reflexes, muscle tone coordination. No cranial nerve deficit noted. PSYCHIATRIC: Normal mood and affect. Normal behavior. Normal judgment and thought content. CARDIOVASCULAR: Normal heart rate noted, regular rhythm RESPIRATORY: Effort and breath sounds normal, no problems with respiration noted ABDOMEN: Soft, nontender, nondistended, gravid. MUSCULOSKELETAL: Normal range of motion. No edema and no tenderness. 2+ distal pulses.  FHT:  present    Pertinent Labs/Studies:   Results for orders placed or performed during the hospital encounter of 03/04/21 (from the past 24 hour(s))  CBC     Status: Abnormal   Collection Time: 03/04/21  8:53 AM  Result Value Ref Range   WBC 15.8 (H) 4.0 - 10.5 K/uL   RBC 3.06 (L) 3.87 - 5.11 MIL/uL   Hemoglobin 9.5 (L) 12.0 - 15.0 g/dL   HCT 05/05/21 (L) 56.2 - 13.0 %   MCV 88.2 80.0 - 100.0 fL   MCH 31.0 26.0 - 34.0 pg   MCHC 35.2 30.0 - 36.0 g/dL   RDW 86.5 78.4 - 69.6 %   Platelets 291 150  - 400 K/uL   nRBC 0.0 0.0 - 0.2 %  Basic metabolic panel     Status: Abnormal   Collection Time: 03/04/21  8:53 AM  Result Value Ref Range   Sodium 132 (L) 135 - 145 mmol/L   Potassium 2.4 (LL) 3.5 - 5.1 mmol/L   Chloride 99 98 - 111 mmol/L   CO2 24 22 - 32 mmol/L   Glucose, Bld  94 70 - 99 mg/dL   BUN 6 6 - 20 mg/dL   Creatinine, Ser 5.72 0.44 - 1.00 mg/dL   Calcium 7.6 (L) 8.9 - 10.3 mg/dL   GFR, Estimated >62 >03 mL/min   Anion gap 9 5 - 15  Magnesium     Status: None   Collection Time: 03/04/21  8:53 AM  Result Value Ref Range   Magnesium 1.9 1.7 - 2.4 mg/dL  Urinalysis, Complete w Microscopic Urine, Clean Catch     Status: Abnormal   Collection Time: 03/04/21  9:16 AM  Result Value Ref Range   Color, Urine YELLOW (A) YELLOW   APPearance HAZY (A) CLEAR   Specific Gravity, Urine 1.009 1.005 - 1.030   pH 7.0 5.0 - 8.0   Glucose, UA NEGATIVE NEGATIVE mg/dL   Hgb urine dipstick MODERATE (A) NEGATIVE   Bilirubin Urine NEGATIVE NEGATIVE   Ketones, ur 5 (A) NEGATIVE mg/dL   Protein, ur 30 (A) NEGATIVE mg/dL   Nitrite NEGATIVE NEGATIVE   Leukocytes,Ua MODERATE (A) NEGATIVE   RBC / HPF 0-5 0 - 5 RBC/hpf   WBC, UA 6-10 0 - 5 WBC/hpf   Bacteria, UA NONE SEEN NONE SEEN   Squamous Epithelial / LPF 0-5 0 - 5  Resp Panel by RT-PCR (Flu A&B, Covid) Nasopharyngeal Swab     Status: None   Collection Time: 03/04/21  9:50 AM   Specimen: Nasopharyngeal Swab; Nasopharyngeal(NP) swabs in vial transport medium  Result Value Ref Range   SARS Coronavirus 2 by RT PCR NEGATIVE NEGATIVE   Influenza A by PCR NEGATIVE NEGATIVE   Influenza B by PCR NEGATIVE NEGATIVE  Type and screen [facility]     Status: None   Collection Time: 03/04/21 10:28 AM  Result Value Ref Range   ABO/RH(D) A POS    Antibody Screen NEG    Sample Expiration      03/07/2021,2359 Performed at [facility], [address], [place], [place] [address]      Imaging:  US Renal CLINICAL  DATA:  LEFT flank pain. History of hydronephrosis. History of stent. Twenty-two weeks pregnant.  EXAM: RENAL / URINARY TRACT ULTRASOUND COMPLETE  COMPARISON:  None.  FINDINGS: Right Kidney:  Renal measurements: 12.6 x 5.4 x 5.5 cm = volume: 199 mL. Echogenicity within normal limits. No mass or hydronephrosis visualized.  Left Kidney:  Renal measurements: 12.5 x 7.3 x 6.2 cm = volume: 297 mL. Echogenicity within normal limits. No mass or hydronephrosis visualized. Stent seen within the LEFT renal pelvis.  Bladder:  Appears normal for degree of bladder distention. Distal portion of the LEFT nephroureteral stent is seen within the bladder.  Other:  None.  IMPRESSION: 1. No acute findings.  No hydronephrosis bilaterally. 2. LEFT-sided nephroureteral stent appears appropriately positioned.  Electronically Signed   By: Bary Richard M.D.   On: 03/04/2021 11:30  Assessment : Laura Sosa is a 35 y.o. G3P2002 at [redacted]w[redacted]d being admitted for pain management for left ureteral stone, left ureteral stent in place (for management of associated hydronephrosis), E. Coli UTI, hypokalemia, nausea/vomiting of pregnancy.    Plan: Urology aware of patient, repeat ultrasound to ensure appropriate location of stent was performed, appears normal. Plan is for  left ureteroscopy with laser lithotripsy early next week. Repeating urine culture.  Currently resumed on IV Ceftriaxone (previously discharged with PO Macrobid). To continue Tamsulosin, Oxybutynin, Vistaril.  Pain management with PO Tylenol and Oxycodone, also has Belladona suppositories prn, and morphine  IV prn for breakthrough pain.  Hypokalemia - may be secondary to nausea and vomiting. IV potassium ordered (total of 40 mEQ). Can also continue PO supplementation once tolerated. Will follow up labs.  Pregnancy - continue PNV as tolerated. Assess FHT daily. SCDs in place for DVT prophylaxis. Continue Zofran prn for nausea/vomiting.      A total of 30 minutes were spent during this encounter, including face-to-face time, discussion of plan, review of labs and previous records.    Hildred Laser, MD [facility]

## 2021-03-04 NOTE — Consult Note (Addendum)
Urology Consult   Physician requesting consult: Phineas Semen, MD  Reason for consult: Left flank pain  History of Present Illness: Laura Sosa is a 35 y.o. G3)2002 who is at EGA [redacted] weeks with a history of nephrolithiasis seen in consultation for recurrent left flank pain.  She presented to the ED on 02/26/2021 with left flank pain, low-grade fever and leukocytosis of 22.3.  Renal ultrasound 02/25/2021 demonstrated moderate left hydronephrosis of uncertain etiology could be related to infection, obstruction or pregnancy.  She was found to have a 7 mm stone within the left renal pelvis near the UPJ.  She was taken the operating room by Dr. Alvester Morin on 02/26/2021 for cystoscopy and left ureteral stent placement.  Of note, she had persistent severe left-sided flank pain the day following requiring narcotic medications.  She was ultimately discharged on 03/02/2021 after her pain improved and she completed a course of antibiotics.  Plans are to schedule for left ureteroscopy with laser lithotripsy and stent exchange early this next week.  She denies fevers or chills.  She denies dysuria.  She denies increased frequency or urgency.  She states that her pain is persistent and unrelated to voiding.  Past Medical History:  Diagnosis Date   Bilateral kidney stones    Headache    History of kidney stones    Migraine    Pyelonephritis 04/17/2016    Past Surgical History:  Procedure Laterality Date   COLPOSCOPY     CYSTOSCOPY WITH STENT PLACEMENT Left 02/26/2021   Procedure: CYSTOSCOPY WITH STENT PLACEMENT;  Surgeon: Crista Elliot, MD;  Location: [facility] ORS;  Service: Urology;  Laterality: Left;   CYSTOSCOPY/RETROGRADE/URETEROSCOPY  03/15/2017   Procedure: CYSTOSCOPY/RETROGRADE/URETEROSCOPY;  Surgeon: Hildred Laser, MD;  Location: [facility] ORS;  Service: Urology;;   LEEP       Current Hospital Medications:  Home meds:  No current facility-administered medications on file prior to encounter.    Current Outpatient Medications on File Prior to Encounter  Medication Sig Dispense Refill   ondansetron (ZOFRAN-ODT) 4 MG disintegrating tablet Take 1 tablet (4 mg total) by mouth every 8 (eight) hours as needed for nausea or vomiting. 60 tablet 0   oxybutynin (DITROPAN) 5 MG tablet Take 1 tablet (5 mg total) by mouth 3 (three) times daily. 20 tablet 0   oxyCODONE (OXY IR/ROXICODONE) 5 MG immediate release tablet Take 1 tablet (5 mg total) by mouth every 3 (three) hours as needed for moderate pain or severe pain. 30 tablet 0   Prenatal Vit-Fe Fumarate-FA (MULTIVITAMIN-PRENATAL) 27-0.8 MG TABS tablet Take 1 tablet by mouth daily at 12 noon.     tamsulosin (FLOMAX) 0.4 MG CAPS capsule Take 1 capsule (0.4 mg total) by mouth daily. 7 capsule 0     Scheduled Meds:  docusate sodium  100 mg Oral Daily   oxybutynin  5 mg Oral TID   prenatal vitamin w/FE, FA  1 tablet Oral Q1200   tamsulosin  0.4 mg Oral Daily   Continuous Infusions:  lactated ringers     potassium chloride 10 mEq (03/04/21 0956)   PRN Meds:.acetaminophen, belladonna-opium, calcium carbonate, hydrOXYzine, morphine injection, ondansetron, oxyCODONE, zolpidem  Allergies: No Known Allergies  Family History  Problem Relation Age of Onset   Hypertension Mother    Diabetes Mother    Diabetes Father    Hypertension Father    Emphysema Maternal Grandmother    Lung cancer Maternal Grandmother    Asthma Daughter    Hypertension Paternal Grandmother  Heart defect Half-Sister        "boot shaped heart"   Kidney cancer Neg Hx    Bladder Cancer Neg Hx    Breast cancer Neg Hx    Ovarian cancer Neg Hx    Colon cancer Neg Hx     Social History:  reports that she has never smoked. She has never used smokeless tobacco. She reports that she does not drink alcohol and does not use drugs.  ROS: A complete review of systems was performed.  All systems are negative except for pertinent findings as noted.  Physical Exam:  Vital  signs in last 24 hours: Temp:  [97.6 F (36.4 C)] 97.6 F (36.4 C) (07/09 0850) Pulse Rate:  [72-82] 82 (07/09 1000) Resp:  [18-20] 18 (07/09 1000) BP: (109-115)/(65-98) 109/65 (07/09 1000) SpO2:  [94 %] 94 % (07/09 1000) Weight:  [74.8 kg] 74.8 kg (07/09 0848) Constitutional:  Alert and oriented, No acute distress Cardiovascular: Regular rate and rhythm Respiratory: Normal respiratory effort, Lungs clear bilaterally GI: Abdomen is soft, nontender, nondistended, no abdominal masses GU: No CVA tenderness Neurologic: Grossly intact, no focal deficits Psychiatric: Normal mood and affect  Laboratory Data:  Recent Labs    03/04/21 0853  WBC 15.8*  HGB 9.5*  HCT 27.0*  PLT 291    Recent Labs    03/04/21 0853  NA 132*  K 2.4*  CL 99  GLUCOSE 94  BUN 6  CALCIUM 7.6*  CREATININE 0.66     Results for orders placed or performed during the hospital encounter of 03/04/21 (from the past 24 hour(s))  CBC     Status: Abnormal   Collection Time: 03/04/21  8:53 AM  Result Value Ref Range   WBC 15.8 (H) 4.0 - 10.5 K/uL   RBC 3.06 (L) 3.87 - 5.11 MIL/uL   Hemoglobin 9.5 (L) 12.0 - 15.0 g/dL   HCT 27.0 (L) 36.0 - 46.0 %   MCV 88.2 80.0 - 100.0 fL   MCH 31.0 26.0 - 34.0 pg   MCHC 35.2 30.0 - 36.0 g/dL   RDW 13.6 11.5 - 15.5 %   Platelets 291 150 - 400 K/uL   nRBC 0.0 0.0 - 0.2 %  Basic metabolic panel     Status: Abnormal   Collection Time: 03/04/21  8:53 AM  Result Value Ref Range   Sodium 132 (L) 135 - 145 mmol/L   Potassium 2.4 (LL) 3.5 - 5.1 mmol/L   Chloride 99 98 - 111 mmol/L   CO2 24 22 - 32 mmol/L   Glucose, Bld 94 70 - 99 mg/dL   BUN 6 6 - 20 mg/dL   Creatinine, Ser 0.66 0.44 - 1.00 mg/dL   Calcium 7.6 (L) 8.9 - 10.3 mg/dL   GFR, Estimated >60 >60 mL/min   Anion gap 9 5 - 15  Magnesium     Status: None   Collection Time: 03/04/21  8:53 AM  Result Value Ref Range   Magnesium 1.9 1.7 - 2.4 mg/dL  Urinalysis, Complete w Microscopic Urine, Clean Catch     Status:  Abnormal   Collection Time: 03/04/21  9:16 AM  Result Value Ref Range   Color, Urine YELLOW (A) YELLOW   APPearance HAZY (A) CLEAR   Specific Gravity, Urine 1.009 1.005 - 1.030   pH 7.0 5.0 - 8.0   Glucose, UA NEGATIVE NEGATIVE mg/dL   Hgb urine dipstick MODERATE (A) NEGATIVE   Bilirubin Urine NEGATIVE NEGATIVE   Ketones, ur 5 (A)   NEGATIVE mg/dL   Protein, ur 30 (A) NEGATIVE mg/dL   Nitrite NEGATIVE NEGATIVE   Leukocytes,Ua MODERATE (A) NEGATIVE   RBC / HPF 0-5 0 - 5 RBC/hpf   WBC, UA 6-10 0 - 5 WBC/hpf   Bacteria, UA NONE SEEN NONE SEEN   Squamous Epithelial / LPF 0-5 0 - 5   Recent Results (from the past 240 hour(s))  Urine Culture     Status: Abnormal   Collection Time: 02/25/21  3:57 PM   Specimen: Urine, Random  Result Value Ref Range Status   Specimen Description   Final    URINE, RANDOM Performed at [facility], [address] [place], [place] [address]    Special Requests   Final    NONE Performed at [facility], [address] [place], [place] [address]    Culture >=100,000 COLONIES/mL ESCHERICHIA COLI (A)  Final   Report Status 02/28/2021 FINAL  Final   Organism ID, Bacteria ESCHERICHIA COLI (A)  Final      Susceptibility   Escherichia coli - MIC*    AMPICILLIN <=2 SENSITIVE Sensitive     CEFAZOLIN <=4 SENSITIVE Sensitive     CEFEPIME <=0.12 SENSITIVE Sensitive     CEFTRIAXONE <=0.25 SENSITIVE Sensitive     CIPROFLOXACIN <=0.25 SENSITIVE Sensitive     GENTAMICIN <=1 SENSITIVE Sensitive     IMIPENEM <=0.25 SENSITIVE Sensitive     NITROFURANTOIN <=16 SENSITIVE Sensitive     TRIMETH/SULFA <=20 SENSITIVE Sensitive     AMPICILLIN/SULBACTAM <=2 SENSITIVE Sensitive     PIP/TAZO <=4 SENSITIVE Sensitive     * >=100,000 COLONIES/mL ESCHERICHIA COLI  Resp Panel by RT-PCR (Flu A&B, Covid) Nasopharyngeal Swab     Status: None   Collection Time: 02/25/21  4:14 PM   Specimen: Nasopharyngeal Swab; Nasopharyngeal(NP) swabs in vial transport medium   Result Value Ref Range Status   SARS Coronavirus 2 by RT PCR NEGATIVE NEGATIVE Final    Comment: (NOTE) SARS-CoV-2 target nucleic acids are NOT DETECTED.  The SARS-CoV-2 RNA is generally detectable in upper respiratory specimens during the acute phase of infection. The lowest concentration of SARS-CoV-2 viral copies this assay can detect is 138 copies/mL. A negative result does not preclude SARS-Cov-2 infection and should not be used as the sole basis for treatment or other patient management decisions. A negative result may occur with  improper specimen collection/handling, submission of specimen other than nasopharyngeal swab, presence of viral mutation(s) within the areas targeted by this assay, and inadequate number of viral copies(<138 copies/mL). A negative result must be combined with clinical observations, patient history, and epidemiological information. The expected result is Negative.  Fact Sheet for Patients:  BloggerCourse.com  Fact Sheet for Healthcare Providers:  SeriousBroker.it  This test is no t yet approved or cleared by the [place] FDA and  has been authorized for detection and/or diagnosis of SARS-CoV-2 by FDA under an Emergency Use Authorization (EUA). This EUA will remain  in effect (meaning this test can be used) for the duration of the COVID-19 declaration under Section 564(b)(1) of the Act, 21 U.S.C.section 360bbb-3(b)(1), unless the authorization is terminated  or revoked sooner.       Influenza A by PCR NEGATIVE NEGATIVE Final   Influenza B by PCR NEGATIVE NEGATIVE Final    Comment: (NOTE) The Xpert Xpress SARS-CoV-2/FLU/RSV plus assay is intended as an aid in the diagnosis of influenza from Nasopharyngeal swab specimens and should not be used as a sole basis for treatment. Nasal  washings and aspirates are unacceptable for Xpert Xpress SARS-CoV-2/FLU/RSV testing.  Fact Sheet for  Patients: BloggerCourse.com  Fact Sheet for Healthcare Providers: SeriousBroker.it  This test is not yet approved or cleared by the [place] FDA and has been authorized for detection and/or diagnosis of SARS-CoV-2 by FDA under an Emergency Use Authorization (EUA). This EUA will remain in effect (meaning this test can be used) for the duration of the COVID-19 declaration under Section 564(b)(1) of the Act, 21 U.S.C. section 360bbb-3(b)(1), unless the authorization is terminated or revoked.  Performed at [facility], [address] [place], [place] [address]     Renal Function: Recent Labs    03/04/21 6213  CREATININE 0.66   Estimated Creatinine Clearance: 97.9 mL/min (by C-G formula based on SCr of 0.66 mg/dL).  Radiologic Imaging: No results found.  I independently reviewed the above imaging studies.  Impression/Recommendation: Left flank pain: Likely due to poorly tolerable left ureteral stent. Left renal pelvis stone: 7 mm left renal pelvis stone identified on renal ultrasound 02/26/2021.  S/p left ureteral stent placement on 02/26/2021. UTI: Urine culture 02/25/2021 resulted in 100,000 and E. coli pansensitive Pregnant: EGA 22 weeeks  -Recommend obtaining renal ultrasound to evaluate for adequate left ureteral stent position. -Pain control per OB/GYN.  Continue Flomax and oxybutynin for stent discomfort. -Repeat urine culture.  Continue Rocephin. -She is scheduled for left ureteroscopy with laser lithotripsy earlier this week.  Hopefully after her stone is treated and her stent is removed, her symptoms will subside.  Matt R. Ahonesty Woodfin MD 03/04/2021, 10:33 AM  [facility]  Pager: [phone]   CC: Phineas Semen, MD  Addendum 03/04/2021 1:06 PM: Reviewed RBUS as below. Stent in appropriate position. Pain likely related to stent discomfort. Continue tamsulosin, oxybutynin and pain control per OBGYN. Planning for  ureteroscopy on Monday.  Matt R. Kabella Cassidy MD [facility]  Pager: [phone]

## 2021-03-04 NOTE — H&P (View-Only) (Signed)
Urology Consult   Physician requesting consult: Phineas Semen, MD  Reason for consult: Left flank pain  History of Present Illness: Laura Sosa is a 35 y.o. G3)2002 who is at EGA [redacted] weeks with a history of nephrolithiasis seen in consultation for recurrent left flank pain.  She presented to the ED on 02/26/2021 with left flank pain, low-grade fever and leukocytosis of 22.3.  Renal ultrasound 02/25/2021 demonstrated moderate left hydronephrosis of uncertain etiology could be related to infection, obstruction or pregnancy.  She was found to have a 7 mm stone within the left renal pelvis near the UPJ.  She was taken the operating room by Dr. Alvester Morin on 02/26/2021 for cystoscopy and left ureteral stent placement.  Of note, she had persistent severe left-sided flank pain the day following requiring narcotic medications.  She was ultimately discharged on 03/02/2021 after her pain improved and she completed a course of antibiotics.  Plans are to schedule for left ureteroscopy with laser lithotripsy and stent exchange early this next week.  She denies fevers or chills.  She denies dysuria.  She denies increased frequency or urgency.  She states that her pain is persistent and unrelated to voiding.  Past Medical History:  Diagnosis Date   Bilateral kidney stones    Headache    History of kidney stones    Migraine    Pyelonephritis 04/17/2016    Past Surgical History:  Procedure Laterality Date   COLPOSCOPY     CYSTOSCOPY WITH STENT PLACEMENT Left 02/26/2021   Procedure: CYSTOSCOPY WITH STENT PLACEMENT;  Surgeon: Crista Elliot, MD;  Location: [facility] ORS;  Service: Urology;  Laterality: Left;   CYSTOSCOPY/RETROGRADE/URETEROSCOPY  03/15/2017   Procedure: CYSTOSCOPY/RETROGRADE/URETEROSCOPY;  Surgeon: Hildred Laser, MD;  Location: [facility] ORS;  Service: Urology;;   LEEP       Current Hospital Medications:  Home meds:  No current facility-administered medications on file prior to encounter.    Current Outpatient Medications on File Prior to Encounter  Medication Sig Dispense Refill   ondansetron (ZOFRAN-ODT) 4 MG disintegrating tablet Take 1 tablet (4 mg total) by mouth every 8 (eight) hours as needed for nausea or vomiting. 60 tablet 0   oxybutynin (DITROPAN) 5 MG tablet Take 1 tablet (5 mg total) by mouth 3 (three) times daily. 20 tablet 0   oxyCODONE (OXY IR/ROXICODONE) 5 MG immediate release tablet Take 1 tablet (5 mg total) by mouth every 3 (three) hours as needed for moderate pain or severe pain. 30 tablet 0   Prenatal Vit-Fe Fumarate-FA (MULTIVITAMIN-PRENATAL) 27-0.8 MG TABS tablet Take 1 tablet by mouth daily at 12 noon.     tamsulosin (FLOMAX) 0.4 MG CAPS capsule Take 1 capsule (0.4 mg total) by mouth daily. 7 capsule 0     Scheduled Meds:  docusate sodium  100 mg Oral Daily   oxybutynin  5 mg Oral TID   prenatal vitamin w/FE, FA  1 tablet Oral Q1200   tamsulosin  0.4 mg Oral Daily   Continuous Infusions:  lactated ringers     potassium chloride 10 mEq (03/04/21 0956)   PRN Meds:.acetaminophen, belladonna-opium, calcium carbonate, hydrOXYzine, morphine injection, ondansetron, oxyCODONE, zolpidem  Allergies: No Known Allergies  Family History  Problem Relation Age of Onset   Hypertension Mother    Diabetes Mother    Diabetes Father    Hypertension Father    Emphysema Maternal Grandmother    Lung cancer Maternal Grandmother    Asthma Daughter    Hypertension Paternal Grandmother  Heart defect Half-Sister        "boot shaped heart"   Kidney cancer Neg Hx    Bladder Cancer Neg Hx    Breast cancer Neg Hx    Ovarian cancer Neg Hx    Colon cancer Neg Hx     Social History:  reports that she has never smoked. She has never used smokeless tobacco. She reports that she does not drink alcohol and does not use drugs.  ROS: A complete review of systems was performed.  All systems are negative except for pertinent findings as noted.  Physical Exam:  Vital  signs in last 24 hours: Temp:  [97.6 F (36.4 C)] 97.6 F (36.4 C) (07/09 0850) Pulse Rate:  [72-82] 82 (07/09 1000) Resp:  [18-20] 18 (07/09 1000) BP: (109-115)/(65-98) 109/65 (07/09 1000) SpO2:  [94 %] 94 % (07/09 1000) Weight:  [74.8 kg] 74.8 kg (07/09 0848) Constitutional:  Alert and oriented, No acute distress Cardiovascular: Regular rate and rhythm Respiratory: Normal respiratory effort, Lungs clear bilaterally GI: Abdomen is soft, nontender, nondistended, no abdominal masses GU: No CVA tenderness Neurologic: Grossly intact, no focal deficits Psychiatric: Normal mood and affect  Laboratory Data:  Recent Labs    03/04/21 0853  WBC 15.8*  HGB 9.5*  HCT 27.0*  PLT 291    Recent Labs    03/04/21 0853  NA 132*  K 2.4*  CL 99  GLUCOSE 94  BUN 6  CALCIUM 7.6*  CREATININE 0.66     Results for orders placed or performed during the hospital encounter of 03/04/21 (from the past 24 hour(s))  CBC     Status: Abnormal   Collection Time: 03/04/21  8:53 AM  Result Value Ref Range   WBC 15.8 (H) 4.0 - 10.5 K/uL   RBC 3.06 (L) 3.87 - 5.11 MIL/uL   Hemoglobin 9.5 (L) 12.0 - 15.0 g/dL   HCT 11.927.0 (L) 14.736.0 - 82.946.0 %   MCV 88.2 80.0 - 100.0 fL   MCH 31.0 26.0 - 34.0 pg   MCHC 35.2 30.0 - 36.0 g/dL   RDW 56.213.6 13.011.5 - 86.515.5 %   Platelets 291 150 - 400 K/uL   nRBC 0.0 0.0 - 0.2 %  Basic metabolic panel     Status: Abnormal   Collection Time: 03/04/21  8:53 AM  Result Value Ref Range   Sodium 132 (L) 135 - 145 mmol/L   Potassium 2.4 (LL) 3.5 - 5.1 mmol/L   Chloride 99 98 - 111 mmol/L   CO2 24 22 - 32 mmol/L   Glucose, Bld 94 70 - 99 mg/dL   BUN 6 6 - 20 mg/dL   Creatinine, Ser 7.840.66 0.44 - 1.00 mg/dL   Calcium 7.6 (L) 8.9 - 10.3 mg/dL   GFR, Estimated >69>60 >62>60 mL/min   Anion gap 9 5 - 15  Magnesium     Status: None   Collection Time: 03/04/21  8:53 AM  Result Value Ref Range   Magnesium 1.9 1.7 - 2.4 mg/dL  Urinalysis, Complete w Microscopic Urine, Clean Catch     Status:  Abnormal   Collection Time: 03/04/21  9:16 AM  Result Value Ref Range   Color, Urine YELLOW (A) YELLOW   APPearance HAZY (A) CLEAR   Specific Gravity, Urine 1.009 1.005 - 1.030   pH 7.0 5.0 - 8.0   Glucose, UA NEGATIVE NEGATIVE mg/dL   Hgb urine dipstick MODERATE (A) NEGATIVE   Bilirubin Urine NEGATIVE NEGATIVE   Ketones, ur 5 (A)  NEGATIVE mg/dL   Protein, ur 30 (A) NEGATIVE mg/dL   Nitrite NEGATIVE NEGATIVE   Leukocytes,Ua MODERATE (A) NEGATIVE   RBC / HPF 0-5 0 - 5 RBC/hpf   WBC, UA 6-10 0 - 5 WBC/hpf   Bacteria, UA NONE SEEN NONE SEEN   Squamous Epithelial / LPF 0-5 0 - 5   Recent Results (from the past 240 hour(s))  Urine Culture     Status: Abnormal   Collection Time: 02/25/21  3:57 PM   Specimen: Urine, Random  Result Value Ref Range Status   Specimen Description   Final    URINE, RANDOM Performed at [facility], [address] [place], [place] [address]    Special Requests   Final    NONE Performed at [facility], [address] [place], [place] [address]    Culture >=100,000 COLONIES/mL ESCHERICHIA COLI (A)  Final   Report Status 02/28/2021 FINAL  Final   Organism ID, Bacteria ESCHERICHIA COLI (A)  Final      Susceptibility   Escherichia coli - MIC*    AMPICILLIN <=2 SENSITIVE Sensitive     CEFAZOLIN <=4 SENSITIVE Sensitive     CEFEPIME <=0.12 SENSITIVE Sensitive     CEFTRIAXONE <=0.25 SENSITIVE Sensitive     CIPROFLOXACIN <=0.25 SENSITIVE Sensitive     GENTAMICIN <=1 SENSITIVE Sensitive     IMIPENEM <=0.25 SENSITIVE Sensitive     NITROFURANTOIN <=16 SENSITIVE Sensitive     TRIMETH/SULFA <=20 SENSITIVE Sensitive     AMPICILLIN/SULBACTAM <=2 SENSITIVE Sensitive     PIP/TAZO <=4 SENSITIVE Sensitive     * >=100,000 COLONIES/mL ESCHERICHIA COLI  Resp Panel by RT-PCR (Flu A&B, Covid) Nasopharyngeal Swab     Status: None   Collection Time: 02/25/21  4:14 PM   Specimen: Nasopharyngeal Swab; Nasopharyngeal(NP) swabs in vial transport medium   Result Value Ref Range Status   SARS Coronavirus 2 by RT PCR NEGATIVE NEGATIVE Final    Comment: (NOTE) SARS-CoV-2 target nucleic acids are NOT DETECTED.  The SARS-CoV-2 RNA is generally detectable in upper respiratory specimens during the acute phase of infection. The lowest concentration of SARS-CoV-2 viral copies this assay can detect is 138 copies/mL. A negative result does not preclude SARS-Cov-2 infection and should not be used as the sole basis for treatment or other patient management decisions. A negative result may occur with  improper specimen collection/handling, submission of specimen other than nasopharyngeal swab, presence of viral mutation(s) within the areas targeted by this assay, and inadequate number of viral copies(<138 copies/mL). A negative result must be combined with clinical observations, patient history, and epidemiological information. The expected result is Negative.  Fact Sheet for Patients:  BloggerCourse.com  Fact Sheet for Healthcare Providers:  SeriousBroker.it  This test is no t yet approved or cleared by the [place] FDA and  has been authorized for detection and/or diagnosis of SARS-CoV-2 by FDA under an Emergency Use Authorization (EUA). This EUA will remain  in effect (meaning this test can be used) for the duration of the COVID-19 declaration under Section 564(b)(1) of the Act, 21 U.S.C.section 360bbb-3(b)(1), unless the authorization is terminated  or revoked sooner.       Influenza A by PCR NEGATIVE NEGATIVE Final   Influenza B by PCR NEGATIVE NEGATIVE Final    Comment: (NOTE) The Xpert Xpress SARS-CoV-2/FLU/RSV plus assay is intended as an aid in the diagnosis of influenza from Nasopharyngeal swab specimens and should not be used as a sole basis for treatment. Nasal  washings and aspirates are unacceptable for Xpert Xpress SARS-CoV-2/FLU/RSV testing.  Fact Sheet for  Patients: BloggerCourse.com  Fact Sheet for Healthcare Providers: SeriousBroker.it  This test is not yet approved or cleared by the [place] FDA and has been authorized for detection and/or diagnosis of SARS-CoV-2 by FDA under an Emergency Use Authorization (EUA). This EUA will remain in effect (meaning this test can be used) for the duration of the COVID-19 declaration under Section 564(b)(1) of the Act, 21 U.S.C. section 360bbb-3(b)(1), unless the authorization is terminated or revoked.  Performed at [facility], [address] [place], [place] [address]     Renal Function: Recent Labs    03/04/21 6213  CREATININE 0.66   Estimated Creatinine Clearance: 97.9 mL/min (by C-G formula based on SCr of 0.66 mg/dL).  Radiologic Imaging: No results found.  I independently reviewed the above imaging studies.  Impression/Recommendation: Left flank pain: Likely due to poorly tolerable left ureteral stent. Left renal pelvis stone: 7 mm left renal pelvis stone identified on renal ultrasound 02/26/2021.  S/p left ureteral stent placement on 02/26/2021. UTI: Urine culture 02/25/2021 resulted in 100,000 and E. coli pansensitive Pregnant: EGA 22 weeeks  -Recommend obtaining renal ultrasound to evaluate for adequate left ureteral stent position. -Pain control per OB/GYN.  Continue Flomax and oxybutynin for stent discomfort. -Repeat urine culture.  Continue Rocephin. -She is scheduled for left ureteroscopy with laser lithotripsy earlier this week.  Hopefully after her stone is treated and her stent is removed, her symptoms will subside.  Matt R. Kimbella Heisler MD 03/04/2021, 10:33 AM  [facility]  Pager: [phone]   CC: Phineas Semen, MD  Addendum 03/04/2021 1:06 PM: Reviewed RBUS as below. Stent in appropriate position. Pain likely related to stent discomfort. Continue tamsulosin, oxybutynin and pain control per OBGYN. Planning for  ureteroscopy on Monday.  Matt R. Idolina Mantell MD [facility]  Pager: [phone]

## 2021-03-04 NOTE — ED Provider Notes (Signed)
[facility] Emergency Department Provider Note   ____________________________________________   I have reviewed the triage vital signs and the nursing notes.   HISTORY  Chief Complaint Flank Pain   History limited by: Not Limited   HPI Laura Sosa is a 35 y.o. female who presents to the emergency department today with primary concern for continued and worsening left flank pain. The patient states that she was recently hospitalized for similar symptoms. Was diagnosed with kidney stones on the left side and had a stent placed. Since going home her pain has continued to get worse. Additionally she was not able to take her antibiotic this morning because of the discomfort. She says she has been having low grade fevers at home. Has been having issues with nausea and vomiting. Is [redacted] weeks pregnant.    Records reviewed. Per medical record review patient has a history of recent admission with diagnoses of kidney stones, with stent placed in the left flank.  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   Infertility, female 12/18/2017   History of pyelonephritis 12/18/2017   History of kidney stones 12/18/2017   Rubella non-immune status, antepartum 12/18/2017   FHx: cleft palate 12/18/2017    Past Surgical History:  Procedure Laterality Date   COLPOSCOPY     CYSTOSCOPY WITH STENT PLACEMENT Left 02/26/2021   Procedure: CYSTOSCOPY WITH STENT PLACEMENT;  Surgeon: Crista Elliot, MD;  Location: [facility] ORS;  Service: Urology;  Laterality: Left;   CYSTOSCOPY/RETROGRADE/URETEROSCOPY  03/15/2017   Procedure:  CYSTOSCOPY/RETROGRADE/URETEROSCOPY;  Surgeon: Hildred Laser, MD;  Location: [facility] ORS;  Service: Urology;;   LEEP      Prior to Admission medications   Medication Sig Start Date End Date Taking? Authorizing Provider  ondansetron (ZOFRAN-ODT) 4 MG disintegrating tablet Take 1 tablet (4 mg total) by mouth every 8 (eight) hours as needed for nausea or vomiting. 01/17/21   Hildred Laser, MD  oxybutynin (DITROPAN) 5 MG tablet Take 1 tablet (5 mg total) by mouth 3 (three) times daily. 03/02/21   Linzie Collin, MD  oxyCODONE (OXY IR/ROXICODONE) 5 MG immediate release tablet Take 1 tablet (5 mg total) by mouth every 3 (three) hours as needed for moderate pain or severe pain. 03/02/21   Linzie Collin, MD  Prenatal Vit-Fe Fumarate-FA (MULTIVITAMIN-PRENATAL) 27-0.8 MG TABS tablet Take 1 tablet by mouth daily at 12 noon.    [provider]  tamsulosin (FLOMAX) 0.4 MG CAPS capsule Take 1 capsule (0.4 mg total) by mouth daily. 03/02/21   Linzie Collin, MD    Allergies Patient has no known allergies.  Family History  Problem Relation Age of Onset   Hypertension Mother    Diabetes Mother    Diabetes Father    Hypertension Father    Emphysema Maternal Grandmother    Lung cancer Maternal Grandmother    Asthma Daughter    Hypertension Paternal Grandmother    Heart defect Half-Sister        "boot shaped heart"   Kidney cancer Neg Hx    Bladder Cancer Neg Hx    Breast cancer Neg Hx  Ovarian cancer Neg Hx    Colon cancer Neg Hx     Social History Social History   Tobacco Use   Smoking status: Never   Smokeless tobacco: Never  Vaping Use   Vaping Use: Never used  Substance Use Topics   Alcohol use: No   Drug use: No    Review of Systems Constitutional: Positive for low grade fever. Eyes: No visual changes. ENT: No sore throat. Cardiovascular: Positive for chest pain. Respiratory: Denies shortness of breath. Gastrointestinal: Positive for left flank  pain. Genitourinary: Negative for dysuria. Musculoskeletal: Negative for back pain. Skin: Negative for rash. Neurological: Negative for headaches, focal weakness or numbness.  ____________________________________________   PHYSICAL EXAM:  VITAL SIGNS: ED Triage Vitals  Enc Vitals Group     BP 03/04/21 0850 (!) 115/98     Pulse Rate 03/04/21 0850 72     Resp 03/04/21 0850 20     Temp 03/04/21 0850 97.6 F (36.4 C)     Temp Source 03/04/21 0850 Oral     SpO2 03/04/21 0850 94 %     Weight 03/04/21 0848 165 lb (74.8 kg)     Height 03/04/21 0848 5\' 3"  (1.6 m)     Head Circumference --      Peak Flow --      Pain Score 03/04/21 0847 10    Constitutional: Alert and oriented.  Eyes: Conjunctivae are normal.  ENT      Head: Normocephalic and atraumatic.      Nose: No congestion/rhinnorhea.      Mouth/Throat: Mucous membranes are moist.      Neck: No stridor. Hematological/Lymphatic/Immunilogical: No cervical lymphadenopathy. Cardiovascular: Normal rate, regular rhythm.  No murmurs, rubs, or gallops.  Respiratory: Normal respiratory effort without tachypnea nor retractions. Breath sounds are clear and equal bilaterally. No wheezes/rales/rhonchi. Gastrointestinal: Positive for left sided abdominal pain, left sided cva tenderness. Genitourinary: Deferred Musculoskeletal: Normal range of motion in all extremities. No lower extremity edema. Neurologic:  Normal speech and language. No gross focal neurologic deficits are appreciated.  Skin:  Skin is warm, dry and intact. No rash noted. Psychiatric: Mood and affect are normal. Speech and behavior are normal. Patient exhibits appropriate insight and judgment.  ____________________________________________    LABS (pertinent positives/negatives)  BMP na 132, k 2.4, glu 94, cr 0.66 CBC wbc 15.8, hgb 9.5, plt 291 UA hazy, moderate hgb, ketones 5, protein 30, moderate leukocytes, 6-10  WBC ____________________________________________   EKG  None  ____________________________________________    RADIOLOGY  None  ____________________________________________   PROCEDURES  Procedures  ____________________________________________   INITIAL IMPRESSION / ASSESSMENT AND PLAN / ED COURSE  Pertinent labs & imaging results that were available during my care of the patient were reviewed by me and considered in my medical decision making (see chart for details).   Patient presented to the emergency department today because of concern for worsening left flank pain. On exam patient does appear uncomfortable and has left sided abd and CVA tenderness. Blood work continues to show leukocytosis. At this time do not feel patient is tolerated outpatient management. Will plan on admission. Discussed with patient. Also discussed with Dr. 05/05/21 with urology, will plan on getting [facility] renal to evaluate stent. Will admit to ob/gyn service.    ____________________________________________   FINAL CLINICAL IMPRESSION(S) / ED DIAGNOSES  Final diagnoses:  Left flank pain     Note: This dictation was prepared with Dragon dictation. Any transcriptional errors that result from this process are unintentional  Phineas Semen, MD 03/04/21 1031

## 2021-03-05 DIAGNOSIS — O26832 Pregnancy related renal disease, second trimester: Secondary | ICD-10-CM | POA: Diagnosis not present

## 2021-03-05 DIAGNOSIS — N1330 Unspecified hydronephrosis: Secondary | ICD-10-CM | POA: Diagnosis not present

## 2021-03-05 DIAGNOSIS — B9620 Unspecified Escherichia coli [E. coli] as the cause of diseases classified elsewhere: Secondary | ICD-10-CM | POA: Diagnosis not present

## 2021-03-05 DIAGNOSIS — Z3A22 22 weeks gestation of pregnancy: Secondary | ICD-10-CM | POA: Diagnosis not present

## 2021-03-05 DIAGNOSIS — O2342 Unspecified infection of urinary tract in pregnancy, second trimester: Secondary | ICD-10-CM | POA: Diagnosis not present

## 2021-03-05 DIAGNOSIS — N201 Calculus of ureter: Secondary | ICD-10-CM | POA: Diagnosis not present

## 2021-03-05 DIAGNOSIS — O99012 Anemia complicating pregnancy, second trimester: Secondary | ICD-10-CM | POA: Diagnosis present

## 2021-03-05 LAB — CBC
HCT: 24.9 % — ABNORMAL LOW (ref 36.0–46.0)
Hemoglobin: 8.6 g/dL — ABNORMAL LOW (ref 12.0–15.0)
MCH: 30.7 pg (ref 26.0–34.0)
MCHC: 34.5 g/dL (ref 30.0–36.0)
MCV: 88.9 fL (ref 80.0–100.0)
Platelets: 316 10*3/uL (ref 150–400)
RBC: 2.8 MIL/uL — ABNORMAL LOW (ref 3.87–5.11)
RDW: 13.7 % (ref 11.5–15.5)
WBC: 16.2 10*3/uL — ABNORMAL HIGH (ref 4.0–10.5)
nRBC: 0 % (ref 0.0–0.2)

## 2021-03-05 LAB — URINE CULTURE: Culture: <10000 — AB

## 2021-03-05 LAB — BASIC METABOLIC PANEL
Anion gap: 7 (ref 5–15)
BUN: <5 mg/dL — ABNORMAL LOW (ref 6–20)
CO2: 24 mmol/L (ref 22–32)
Calcium: 7.3 mg/dL — ABNORMAL LOW (ref 8.9–10.3)
Chloride: 102 mmol/L (ref 98–111)
Creatinine, Ser: 0.65 mg/dL (ref 0.44–1.00)
GFR, Estimated: >60 mL/min (ref >60)
Glucose, Bld: 80 mg/dL (ref 70–99)
Potassium: 2.5 mmol/L — CL (ref 3.5–5.1)
Sodium: 133 mmol/L — ABNORMAL LOW (ref 135–145)

## 2021-03-05 MED ORDER — FERROUS SULFATE 325 (65 FE) MG PO TABS
325.0000 mg | ORAL_TABLET | Freq: Two times a day (BID) | ORAL | Status: DC
  Administered 2021-03-05 – 2021-03-08 (×5): 325 mg via ORAL
  Filled 2021-03-05 (×5): qty 1

## 2021-03-05 MED ORDER — POTASSIUM CHLORIDE 10 MEQ/100ML IV SOLN
10.0000 meq | INTRAVENOUS | Status: AC
  Administered 2021-03-05 (×4): 10 meq via INTRAVENOUS
  Filled 2021-03-05 (×4): qty 100

## 2021-03-05 MED ORDER — POTASSIUM CHLORIDE CRYS ER 20 MEQ PO TBCR
40.0000 meq | EXTENDED_RELEASE_TABLET | Freq: Two times a day (BID) | ORAL | Status: DC
  Administered 2021-03-05 – 2021-03-06 (×3): 40 meq via ORAL
  Filled 2021-03-05 (×3): qty 2

## 2021-03-05 NOTE — Progress Notes (Signed)
Antenatal Progress Note  Subjective:     Patient ID: Laura Sosa is a 35 y.o. female [redacted]w[redacted]d, Estimated Date of Delivery: 07/05/21 by 1st trimester sono (unknown LMP) who was admitted for pain management secondary to left renal stone, currently with left ureteral stent in place, E. Coli UTI.  HD# 2.   Subjective:  Patient reports that her pain is much better. Notes that the belladona suppositories are really helping. Is noting some mild swelling on her left side/flank.  No further fevers or chills. No hematuria. Is able to tolerate a little more PO intake today.   Review of Systems Denies contractions, leakage of fluids, vaginal bleeding, and reports good fetal movement.     Objective:   Vitals:   03/04/21 1120 03/04/21 1534 03/04/21 2349 03/05/21 0801  BP: 106/66 103/68 114/70 110/67  Pulse: 76 86 100 95  Resp: 18 18 20    Temp: 98.3 F (36.8 C) 98.2 F (36.8 C) 98.3 F (36.8 C) 98.4 F (36.9 C)  TempSrc: Oral Oral Oral Oral  SpO2: 98% 95% 93% 97%  Weight:      Height:        General appearance: alert and no distress Lungs: clear to auscultation bilaterally Heart: regular rate and rhythm, S1, S2 normal, no murmur, click, rub or gallop Abdomen: soft, non-tender; bowel sounds normal; no masses,  no organomegaly. Gravid. Mild swelling of left side/flank.  Pelvic: deferred Extremities: extremities normal, atraumatic, no cyanosis or edema   FHT: not yet performed today (requested to wait until her boyfriend arrived).     Labs:  CBC Latest Ref Rng & Units 03/05/2021 03/04/2021 02/25/2021  WBC 4.0 - 10.5 K/uL 16.2(H) 15.8(H) 22.3(H)  Hemoglobin 12.0 - 15.0 g/dL 04/28/2021) 4.0(N) 0.2(V  Hematocrit 36.0 - 46.0 % 24.9(L) 27.0(L) 34.2(L)  Platelets 150 - 400 K/uL 316 291 256    CMP Latest Ref Rng & Units 03/05/2021 03/04/2021 11/30/2020  Glucose 70 - 99 mg/dL 80 94 01/30/2021)  BUN 6 - 20 mg/dL 66(Y) 6 7  Creatinine <4(I - 1.00 mg/dL 3.47 4.25 9.56  Sodium 135 - 145 mmol/L 133(L) 132(L)  133(L)  Potassium 3.5 - 5.1 mmol/L 2.5(LL) 2.4(LL) 3.6  Chloride 98 - 111 mmol/L 102 99 104  CO2 22 - 32 mmol/L 24 24 21(L)  Calcium 8.9 - 10.3 mg/dL 7.3(L) 7.6(L) 8.3(L)  Total Protein 6.5 - 8.1 g/dL - - -  Total Bilirubin 0.3 - 1.2 mg/dL - - -  Alkaline Phos 38 - 126 U/L - - -  AST 15 - 41 U/L - - -  ALT 0 - 44 U/L - - -    Lab Results  Component Value Date   ABORH A POS 03/04/2021    Results for orders placed or performed during the hospital encounter of 03/04/21  Resp Panel by RT-PCR (Flu A&B, Covid) Nasopharyngeal Swab   Specimen: Nasopharyngeal Swab; Nasopharyngeal(NP) swabs in vial transport medium  Result Value Ref Range   SARS Coronavirus 2 by RT PCR NEGATIVE NEGATIVE   Influenza A by PCR NEGATIVE NEGATIVE   Influenza B by PCR NEGATIVE NEGATIVE  Urine culture   Specimen: Urine, Random  Result Value Ref Range   Specimen Description      URINE, RANDOM Performed at [facility], [address] [place], [place]    Special Requests      NONE Performed at [facility], [address] [place], [place]    Culture (A)     <  10,000 COLONIES/mL INSIGNIFICANT GROWTH Performed at [facility], [address], [place], [place] [address]    Report Status 03/05/2021 FINAL     Assessment:   Laura Sosa is a 35 y.o. G3P2002 at [redacted]w[redacted]d admitted for pain management for left ureteral stone, left ureteral stent in place (for management of associated hydronephrosis), E. Coli UTI, hypokalemia, hypocalcemia, anemia, nausea/vomiting of pregnancy.     Plan:  Urology plans for left ureteroscopy with laser lithotripsy and replacement of stent tomorrow. Repeat urine culture appears to be negative.  Currently resumed on IV Ceftriaxone (previously discharged with PO Macrobid). Can likely stop antibiotics at this time but will defer to Urology. To continue Tamsulosin, Oxybutynin, Vistaril prn. Will make NPO at midnight.  Pain management with  PO Tylenol and Oxycodone, also has Belladona suppositories prn, and morphine IV prn for breakthrough pain. Hypokalemia - may be secondary to nausea and vomiting or possible renal cause. Received 4 runs of 10 mEQ of potassium yesterday with minimal improvement. Will repeat IV runs today, and also administer PO potassium Continue to follow up labs. Anemia of pregnancy, will begin BID PO iron supplementation. No active bleeding noted.  Pregnancy - continue PNV as tolerated. Assess FHT daily. SCDs in place for DVT prophylaxis. Continue Zofran prn for nausea/vomiting.     A total of 15 minutes were spent face-to-face with the patient during this encounter.  Hildred Laser, MD [facility]

## 2021-03-06 ENCOUNTER — Inpatient Hospital Stay: Payer: Medicaid Other

## 2021-03-06 ENCOUNTER — Inpatient Hospital Stay: Payer: Medicaid Other | Admitting: Anesthesiology

## 2021-03-06 ENCOUNTER — Encounter: Payer: Self-pay | Admitting: Obstetrics and Gynecology

## 2021-03-06 ENCOUNTER — Encounter
Admission: EM | Disposition: A | Discharge status: Home / Self Care | Payer: Self-pay | Source: Home / Self Care | Attending: Obstetrics and Gynecology

## 2021-03-06 DIAGNOSIS — N201 Calculus of ureter: Secondary | ICD-10-CM | POA: Diagnosis not present

## 2021-03-06 HISTORY — PX: CYSTOSCOPY/URETEROSCOPY/HOLMIUM LASER/STENT PLACEMENT: SHX6546

## 2021-03-06 LAB — BASIC METABOLIC PANEL
Anion gap: 7 (ref 5–15)
BUN: <5 mg/dL — ABNORMAL LOW (ref 6–20)
CO2: 26 mmol/L (ref 22–32)
Calcium: 7.3 mg/dL — ABNORMAL LOW (ref 8.9–10.3)
Chloride: 101 mmol/L (ref 98–111)
Creatinine, Ser: 0.69 mg/dL (ref 0.44–1.00)
GFR, Estimated: >60 mL/min (ref >60)
Glucose, Bld: 61 mg/dL — ABNORMAL LOW (ref 70–99)
Potassium: 2.8 mmol/L — ABNORMAL LOW (ref 3.5–5.1)
Sodium: 134 mmol/L — ABNORMAL LOW (ref 135–145)

## 2021-03-06 LAB — CBC
HCT: 23.5 % — ABNORMAL LOW (ref 36.0–46.0)
Hemoglobin: 8.1 g/dL — ABNORMAL LOW (ref 12.0–15.0)
MCH: 31.2 pg (ref 26.0–34.0)
MCHC: 34.5 g/dL (ref 30.0–36.0)
MCV: 90.4 fL (ref 80.0–100.0)
Platelets: 363 10*3/uL (ref 150–400)
RBC: 2.6 MIL/uL — ABNORMAL LOW (ref 3.87–5.11)
RDW: 13.6 % (ref 11.5–15.5)
WBC: 17.3 10*3/uL — ABNORMAL HIGH (ref 4.0–10.5)
nRBC: 0 % (ref 0.0–0.2)

## 2021-03-06 LAB — POTASSIUM: Potassium: 3 mmol/L — ABNORMAL LOW (ref 3.5–5.1)

## 2021-03-06 SURGERY — CYSTOSCOPY/URETEROSCOPY/HOLMIUM LASER/STENT PLACEMENT
Anesthesia: General | Laterality: Left

## 2021-03-06 MED ORDER — ATROPINE SULFATE 1 MG/10ML IJ SOSY
PREFILLED_SYRINGE | INTRAMUSCULAR | Status: AC
  Filled 2021-03-06: qty 10

## 2021-03-06 MED ORDER — FENTANYL CITRATE (PF) 100 MCG/2ML IJ SOLN
INTRAMUSCULAR | Status: AC
  Administered 2021-03-06: 25 ug via INTRAVENOUS
  Filled 2021-03-06: qty 2

## 2021-03-06 MED ORDER — SODIUM CHLORIDE 0.9 % IV SOLN
1000.0000 mg | Freq: Once | INTRAVENOUS | Status: AC
  Administered 2021-03-06: 1000 mg via INTRAVENOUS
  Filled 2021-03-06: qty 20

## 2021-03-06 MED ORDER — LIDOCAINE HCL (CARDIAC) PF 100 MG/5ML IV SOSY
PREFILLED_SYRINGE | INTRAVENOUS | Status: DC | PRN
  Administered 2021-03-06: 80 mg via INTRAVENOUS

## 2021-03-06 MED ORDER — SODIUM CHLORIDE 0.9 % IV SOLN
1.0000 g | Freq: Once | INTRAVENOUS | Status: AC
  Administered 2021-03-06: 2 g via INTRAVENOUS
  Filled 2021-03-06: qty 1

## 2021-03-06 MED ORDER — ONDANSETRON HCL 4 MG/2ML IJ SOLN: 4.0000 mg | Freq: Once | INTRAMUSCULAR | Status: DC | PRN

## 2021-03-06 MED ORDER — ALBUTEROL SULFATE (2.5 MG/3ML) 0.083% IN NEBU: 2.5000 mg | INHALATION_SOLUTION | Freq: Once | RESPIRATORY_TRACT | Status: DC | PRN

## 2021-03-06 MED ORDER — FENTANYL CITRATE (PF) 100 MCG/2ML IJ SOLN
INTRAMUSCULAR | Status: DC | PRN
  Administered 2021-03-06 (×2): 50 ug via INTRAVENOUS

## 2021-03-06 MED ORDER — FENTANYL CITRATE (PF) 100 MCG/2ML IJ SOLN
INTRAMUSCULAR | Status: AC
  Filled 2021-03-06: qty 2

## 2021-03-06 MED ORDER — FENTANYL CITRATE (PF) 100 MCG/2ML IJ SOLN
25.0000 ug | INTRAMUSCULAR | Status: DC | PRN
  Administered 2021-03-06: 50 ug via INTRAVENOUS
  Administered 2021-03-06: 25 ug via INTRAVENOUS

## 2021-03-06 MED ORDER — SODIUM CHLORIDE 0.9 % IR SOLN
Status: DC | PRN
  Administered 2021-03-06: 3000 mL

## 2021-03-06 MED ORDER — DEXAMETHASONE SODIUM PHOSPHATE 10 MG/ML IJ SOLN
INTRAMUSCULAR | Status: DC | PRN
  Administered 2021-03-06: 10 mg via INTRAVENOUS

## 2021-03-06 MED ORDER — DIPHENHYDRAMINE HCL 50 MG/ML IJ SOLN: 25.0000 mg | Freq: Once | INTRAMUSCULAR | Status: DC | PRN

## 2021-03-06 MED ORDER — SODIUM CHLORIDE 0.9 % IV BOLUS: 500.0000 mL | Freq: Once | INTRAVENOUS | Status: DC | PRN

## 2021-03-06 MED ORDER — POTASSIUM CHLORIDE 10 MEQ/100ML IV SOLN
10.0000 meq | INTRAVENOUS | Status: AC
  Administered 2021-03-06 (×4): 10 meq via INTRAVENOUS
  Filled 2021-03-06 (×4): qty 100

## 2021-03-06 MED ORDER — PROPOFOL 10 MG/ML IV BOLUS
INTRAVENOUS | Status: AC
  Filled 2021-03-06: qty 20

## 2021-03-06 MED ORDER — ACETAMINOPHEN 10 MG/ML IV SOLN
INTRAVENOUS | Status: DC | PRN
  Administered 2021-03-06: 1000 mg via INTRAVENOUS

## 2021-03-06 MED ORDER — ACETAMINOPHEN 10 MG/ML IV SOLN
INTRAVENOUS | Status: AC
  Filled 2021-03-06: qty 100

## 2021-03-06 MED ORDER — PROPOFOL 10 MG/ML IV BOLUS
INTRAVENOUS | Status: DC | PRN
  Administered 2021-03-06: 130 mg via INTRAVENOUS

## 2021-03-06 MED ORDER — OXYCODONE HCL 5 MG PO TABS
ORAL_TABLET | ORAL | Status: AC
  Filled 2021-03-06: qty 1

## 2021-03-06 MED ORDER — ONDANSETRON HCL 4 MG/2ML IJ SOLN
INTRAMUSCULAR | Status: DC | PRN
  Administered 2021-03-06: 4 mg via INTRAVENOUS

## 2021-03-06 MED ORDER — SUCCINYLCHOLINE CHLORIDE 20 MG/ML IJ SOLN
INTRAMUSCULAR | Status: DC | PRN
  Administered 2021-03-06: 80 mg via INTRAVENOUS

## 2021-03-06 MED ORDER — BELLADONNA ALKALOIDS-OPIUM 16.2-60 MG RE SUPP
RECTAL | Status: DC | PRN
  Administered 2021-03-06: 1 via RECTAL

## 2021-03-06 MED ORDER — NEOSTIGMINE METHYLSULFATE 10 MG/10ML IV SOLN
INTRAVENOUS | Status: AC
  Filled 2021-03-06: qty 1

## 2021-03-06 MED ORDER — METHYLPREDNISOLONE SODIUM SUCC 125 MG IJ SOLR
125.0000 mg | Freq: Once | INTRAMUSCULAR | Status: DC | PRN
  Filled 2021-03-06 (×2): qty 2

## 2021-03-06 MED ORDER — ACETAMINOPHEN 10 MG/ML IV SOLN: 1000.0000 mg | Freq: Once | INTRAVENOUS | Status: DC | PRN

## 2021-03-06 MED ORDER — SODIUM CHLORIDE 0.9 % IV SOLN: INTRAVENOUS | Status: DC | PRN

## 2021-03-06 MED ORDER — STERILE WATER FOR IRRIGATION IR SOLN
Status: DC | PRN
  Administered 2021-03-06: 1000 mL

## 2021-03-06 MED ORDER — SODIUM CHLORIDE 0.9 % IV SOLN
25.0000 mg | Freq: Once | INTRAVENOUS | Status: AC
  Administered 2021-03-06: 25 mg via INTRAVENOUS
  Filled 2021-03-06: qty 0.5

## 2021-03-06 MED ORDER — BELLADONNA ALKALOIDS-OPIUM 16.2-60 MG RE SUPP
RECTAL | Status: AC
  Filled 2021-03-06: qty 1

## 2021-03-06 MED ORDER — PHENYLEPHRINE HCL (PRESSORS) 10 MG/ML IV SOLN
INTRAVENOUS | Status: DC | PRN
  Administered 2021-03-06: 50 ug via INTRAVENOUS

## 2021-03-06 MED ORDER — EPINEPHRINE PF 1 MG/ML IJ SOLN
0.3000 mg | Freq: Once | INTRAMUSCULAR | Status: DC | PRN
  Filled 2021-03-06: qty 1

## 2021-03-06 MED ORDER — PROPOFOL 500 MG/50ML IV EMUL
INTRAVENOUS | Status: DC | PRN
  Administered 2021-03-06: 25 ug/kg/min via INTRAVENOUS

## 2021-03-06 SURGICAL SUPPLY — 28 items
BAG DRAIN CYSTO-URO LG1000N (MISCELLANEOUS) ×2 IMPLANT
BASKET ZERO TIP 1.9FR (BASKET) IMPLANT
BRUSH SCRUB EZ 1% IODOPHOR (MISCELLANEOUS) ×2 IMPLANT
CATH URET FLEX-TIP 2 LUMEN 10F (CATHETERS) ×1 IMPLANT
CATH URETL 5X70 OPEN END (CATHETERS) ×2 IMPLANT
CNTNR SPEC 2.5X3XGRAD LEK (MISCELLANEOUS)
CONT SPEC 4OZ STER OR WHT (MISCELLANEOUS)
CONTAINER SPEC 2.5X3XGRAD LEK (MISCELLANEOUS) IMPLANT
DRAPE UTILITY 15X26 TOWEL STRL (DRAPES) ×2 IMPLANT
DRSG TEGADERM 4X4.75 (GAUZE/BANDAGES/DRESSINGS) ×1 IMPLANT
GAUZE 4X4 16PLY ~~LOC~~+RFID DBL (SPONGE) ×4 IMPLANT
GLOVE SURG ENC MOIS LTX SZ6.5 (GLOVE) ×2 IMPLANT
GOWN STRL REUS W/ TWL LRG LVL3 (GOWN DISPOSABLE) ×2 IMPLANT
GOWN STRL REUS W/TWL LRG LVL3 (GOWN DISPOSABLE) ×2
GUIDEWIRE GREEN .038 145CM (MISCELLANEOUS) ×1 IMPLANT
GUIDEWIRE STR DUAL SENSOR (WIRE) ×2 IMPLANT
INFUSOR MANOMETER BAG 3000ML (MISCELLANEOUS) ×2 IMPLANT
IV NS IRRIG 3000ML ARTHROMATIC (IV SOLUTION) ×2 IMPLANT
KIT TURNOVER CYSTO (KITS) ×2 IMPLANT
MANIFOLD NEPTUNE II (INSTRUMENTS) ×2 IMPLANT
PACK CYSTO AR (MISCELLANEOUS) ×2 IMPLANT
SET CYSTO W/LG BORE CLAMP LF (SET/KITS/TRAYS/PACK) ×2 IMPLANT
SHEATH URETERAL 12FRX35CM (MISCELLANEOUS) IMPLANT
STENT URET 6FRX24 CONTOUR (STENTS) ×1 IMPLANT
STENT URET 6FRX26 CONTOUR (STENTS) IMPLANT
SURGILUBE 2OZ TUBE FLIPTOP (MISCELLANEOUS) ×2 IMPLANT
TRACTIP FLEXIVA PULSE ID 200 (Laser) ×2 IMPLANT
WATER STERILE IRR 1000ML POUR (IV SOLUTION) ×2 IMPLANT

## 2021-03-06 NOTE — Op Note (Signed)
Date of procedure: 03/06/21  Preoperative diagnosis:  Left UPJ stone Left hydronephrosis  Postoperative diagnosis:  Same as above  Procedure: Left ureteroscopy with laser lithotripsy  Left ureteral stent exchange Intraoperative ultrasound guidance   Surgeon: Vanna Scotland, MD  Anesthesia: General  Complications: None  Intraoperative findings: Single approximately 7 mm stone identified in the midpole calyx completely obliterated.  No other stones identified.  The entirety of the renal collecting system was carefully inspected and there is no residual stones other than some stone debris/dust on intraoperative ultrasound.  Stent placed without difficulty on tether.  EBL: Minimal  Specimens: None  Drains: 6 x 24 French double-J ureteral stent on left with tether  Indication: Laura Sosa is a 35 y.o. patient with left hydronephrosis/infection related to ureteral stone status post stenting who returns today for definitive management of her stone.  She had difficulty tolerating the stent and was readmitted over the weekend.  After reviewing the management options for treatment, she elected to proceed with the above surgical procedure(s). We have discussed the potential benefits and risks of the procedure, side effects of the proposed treatment, the likelihood of the patient achieving the goals of the procedure, and any potential problems that might occur during the procedure or recuperation. Informed consent has been obtained.  Description of procedure:  The patient was taken to the operating room and general anesthesia was induced.  The patient was placed in the dorsal lithotomy position, prepped and draped in the usual sterile fashion, and preoperative antibiotics were administered. A preoperative time-out was performed.   A 21 French cystoscope was advanced per urethra into the bladder.  Attention was turned to the left ureteral orifice from which a ureteral stent was seen  emanating.  Stent grasper was used to grasp the distal coil and bring it to level of the urethral meatus.  Using ultrasound guidance in real-time, the kidney was visualized and I was able to see the wire passed into the kidney upon cannulating the stent.  The wire was left in place and the stent was removed.  I used a dual-lumen then to introduce a second Super Stiff wire into the kidney which was also seen in real-time ultrasound.  The sensor wire was snapped in place as a safety wire.  I then used a 7 [place] digital flexible ureteroscope and advanced it up to the level of the kidney using direct visualization as well as intraoperative ultrasound.  Upon entering the kidney, there was a small amount edema in the renal pelvis where the stent had curled.  In addition to this, a stone could be identified in midpole calyx.  On ultrasound, it appeared that there was probably only 1 stone.  A 200 m laser fiber was then brought in and using the settings of 0.3 J and 60 Hz of dusting settings, the stone was obliterated into minute fragments approximately the tip of the size of the laser fiber.  He each and every calyx was then directly visualized.  I used intraoperative ultrasound to ensure that I had looked at each and every calyx.  There was some dust material seen in the calyx where the stone had been obliterated but on direct visualization, none of this was of any significant size.  The scope was then backed out the length of the ureter and speculum the way.  There was some mild narrowing at the UPJ but otherwise there is no ureteral injuries edema or stone fragments identified.  Lastly, 6 x 24  [place] double-J ureteral stent was advanced over the wire up to the level of the kidney.  The wire was withdrawn until full coil was noted both within the renal pelvis as well as within the bladder on ultrasound.  These images were saved.  The stent string was left left attached to the distal end of the the stent which was  attached to the patient's left inner thigh using Mastisol and Tegaderm.  The patient was then cleaned and dried, repositioned in supine position, reversed of anesthesia and taken to PACU in stable condition.  Fetal tones were identified in the PACU.  Plan: Patient will return to the floor for continued observation and pain control.  She may be discharged from urologic perspective as soon as her pain is able to be controlled.  If she still here on Wednesday, will help assist remove her stent otherwise this can be done on her own.  She will follow-up with me in about 4 weeks with renal ultrasound prior.  Vanna Scotland, M.D.

## 2021-03-06 NOTE — Anesthesia Postprocedure Evaluation (Signed)
Anesthesia Post Note  Patient: [place] R Capitano  Procedure(s) Performed: CYSTOSCOPY/URETEROSCOPY/HOLMIUM LASER/STENT PLACEMENT (Left)  Patient location during evaluation: PACU Anesthesia Type: General Level of consciousness: awake and alert Pain management: pain level controlled Vital Signs Assessment: post-procedure vital signs reviewed and stable Respiratory status: spontaneous breathing, nonlabored ventilation, respiratory function stable and patient connected to nasal cannula oxygen Cardiovascular status: blood pressure returned to baseline and stable Postop Assessment: no apparent nausea or vomiting Anesthetic complications: no Comments:  Fetal heart tones performed post op. Present and normal   No notable events documented.   Last Vitals:  Vitals:   03/06/21 1445 03/06/21 1503  BP: 105/66 (!) 108/57  Pulse: 92 89  Resp: (!) 21 18  Temp: (!) 36.3 C 36.7 C  SpO2: 93% 93%    Last Pain:  Vitals:   03/06/21 1515  TempSrc:   PainSc: 7                  Corinda Gubler

## 2021-03-06 NOTE — Anesthesia Preprocedure Evaluation (Signed)
Anesthesia Evaluation  Patient identified by MRN, date of birth, ID band Patient awake  General Assessment Comment:  [redacted]weeks pregnant patient with obstructing stone, s/p ureteral stent placement last week (done under spinal anesthesia).  Denies nausea/vomiting for past 3 days Hypokalemia, s/p copious PO and IV repletion  Reviewed: Allergy & Precautions, NPO status , Patient's Chart, lab work & pertinent test results  History of Anesthesia Complications Negative for: history of anesthetic complications  Airway Mallampati: III  TM Distance: >3 FB Neck ROM: Full    Dental no notable dental hx. (+) Teeth Intact   Pulmonary neg pulmonary ROS, neg sleep apnea, neg COPD, Patient abstained from smoking.Not current smoker,    Pulmonary exam normal breath sounds clear to auscultation       Cardiovascular Exercise Tolerance: Good METS(-) hypertension(-) CAD and (-) Past MI negative cardio ROS  (-) dysrhythmias  Rhythm:Regular Rate:Normal - Systolic murmurs    Neuro/Psych  Headaches, negative psych ROS   GI/Hepatic neg GERD  ,(+)     (-) substance abuse  ,   Endo/Other  neg diabetes  Renal/GU Renal disease     Musculoskeletal   Abdominal   Peds  Hematology  (+) anemia ,   Anesthesia Other Findings Past Medical History: No date: Bilateral kidney stones No date: Headache No date: History of kidney stones No date: Migraine 04/17/2016: Pyelonephritis  Reproductive/Obstetrics (+) Pregnancy 23 weeks                             Anesthesia Physical Anesthesia Plan  ASA: 2  Anesthesia Plan: General   Post-op Pain Management:    Induction: Intravenous and Rapid sequence  PONV Risk Score and Plan: 4 or greater and Ondansetron, Dexamethasone and Treatment may vary due to age or medical condition  Airway Management Planned: Oral ETT and Video Laryngoscope Planned  Additional Equipment:  None  Intra-op Plan:   Post-operative Plan: Extubation in OR  Informed Consent: I have reviewed the patients History and Physical, chart, labs and discussed the procedure including the risks, benefits and alternatives for the proposed anesthesia with the patient or authorized representative who has indicated his/her understanding and acceptance.     Dental advisory given  Plan Discussed with: CRNA and Surgeon  Anesthesia Plan Comments: (Discussed risks of anesthesia with patient, including PONV, sore throat, lip/dental damage. Rare risks discussed as well, such as cardiorespiratory and neurological sequelae, and consequences to pregnancy such as premature labor and delivery, and fetal death. Alleviated patient's concerns that benefits of taking care of her obstructing stone far outweigh risks of anesthetic. Spoke with surgeon Dr Apolinar Junes who does not believe she can perform this procedure under spinal anesthesia and would prefer a GETA with paralysis. Plan for preop and postop fetal heart tone monitoring by OB. Patient understands.)        Anesthesia Quick Evaluation

## 2021-03-06 NOTE — Progress Notes (Addendum)
Antenatal Progress Note  Subjective:     Patient ID: Laura R Fleck is a 35 y.o. female [redacted]w[redacted]d, Estimated Date of Delivery: 07/05/21 by 1st trimester sono (unknown LMP) who was admitted for pain management secondary to left renal stone, currently with left ureteral stent in place, E. Coli UTI.  HD# 3.   Subjective:  Patient denies any major complaints. Pain controlled with meds.  No further fevers or chills. No hematuria. Is able to tolerate a little more PO intake today.   Review of Systems Denies contractions, leakage of fluids, vaginal bleeding, and reports good fetal movement.     Objective:   Vitals:   03/05/21 0801 03/05/21 1542 03/05/21 2318 03/06/21 0727  BP: 110/67 109/65 120/66 109/71  Pulse: 95 88 94 96  Resp:  18 18 18   Temp: 98.4 F (36.9 C) 98.4 F (36.9 C) 98.5 F (36.9 C) 98.3 F (36.8 C)  TempSrc: Oral Oral Oral Oral  SpO2: 97% 98% 95% 96%  Weight:      Height:        General appearance: alert and no distress Lungs: clear to auscultation bilaterally Heart: regular rate and rhythm, S1, S2 normal, no murmur, click, rub or gallop Abdomen: soft, non-tender; bowel sounds normal; no masses,  no organomegaly. Gravid. Mild swelling of left side/flank.  Pelvic: deferred Extremities: extremities normal, atraumatic, no cyanosis or edema   FHT:  148 bpm    Labs:  CBC Latest Ref Rng & Units 03/06/2021 03/05/2021 03/04/2021  WBC 4.0 - 10.5 K/uL 17.3(H) 16.2(H) 15.8(H)  Hemoglobin 12.0 - 15.0 g/dL 8.1(L) 8.6(L) 9.5(L)  Hematocrit 36.0 - 46.0 % 23.5(L) 24.9(L) 27.0(L)  Platelets 150 - 400 K/uL 363 316 291    CMP Latest Ref Rng & Units 03/06/2021 03/05/2021 03/04/2021  Glucose 70 - 99 mg/dL 05/05/2021) 80 94  BUN 6 - 20 mg/dL 26(J) <3(H) 6  Creatinine 0.44 - 1.00 mg/dL <5(K 5.62 5.63  Sodium 135 - 145 mmol/L 134(L) 133(L) 132(L)  Potassium 3.5 - 5.1 mmol/L 2.8(L) 2.5(LL) 2.4(LL)  Chloride 98 - 111 mmol/L 101 102 99  CO2 22 - 32 mmol/L 26 24 24   Calcium 8.9 - 10.3 mg/dL  7.3(L) 7.3(L) 7.6(L)  Total Protein 6.5 - 8.1 g/dL - - -  Total Bilirubin 0.3 - 1.2 mg/dL - - -  Alkaline Phos 38 - 126 U/L - - -  AST 15 - 41 U/L - - -  ALT 0 - 44 U/L - - -    Lab Results  Component Value Date   ABORH A POS 03/04/2021    Results for orders placed or performed during the hospital encounter of 03/04/21  Resp Panel by RT-PCR (Flu A&B, Covid) Nasopharyngeal Swab   Specimen: Nasopharyngeal Swab; Nasopharyngeal(NP) swabs in vial transport medium  Result Value Ref Range   SARS Coronavirus 2 by RT PCR NEGATIVE NEGATIVE   Influenza A by PCR NEGATIVE NEGATIVE   Influenza B by PCR NEGATIVE NEGATIVE  Urine culture   Specimen: Urine, Random  Result Value Ref Range   Specimen Description      URINE, RANDOM Performed at [facility], [address] [place], [address] [place]    Special Requests      NONE Performed at [facility], [address] [place], [address] [place]    Culture (A)     <10,000 COLONIES/mL INSIGNIFICANT GROWTH Performed at [facility], [address], [place], [address] [place]    Report Status 03/05/2021 FINAL  Assessment:   Laura Sosa is a 35 y.o. G3P2002 at [redacted]w[redacted]d admitted for pain management for left ureteral stone, left ureteral stent in place (for management of associated hydronephrosis), E. Coli UTI, hypokalemia, hypocalcemia, anemia, nausea/vomiting of pregnancy.     Plan:  Urology plans for left ureteroscopy with laser lithotripsy and replacement of this morning. Patient has been NPO since midnight. Repeat urine culture appears to be negative.  On IV Ceftriaxone currently.  Will stop antibiotics after today. Repeat urine culture is negative. To continue Tamsulosin, Oxybutynin, Vistaril prn.  Pain management with PO Tylenol and Oxycodone, also has [place] suppositories prn, and morphine IV prn for breakthrough pain. Hypokalemia - may be secondary to nausea and vomiting or possible renal cause.  Received total of 8 runs of IV 10 mEQ of potassium since admission (4 per day) with slow improvement. Continue IV and PO supplementation.  Hypocalcemia - patient with moderate hypocalcemia, ordered Tums, however patient has been refusing medication.  Anemia of pregnancy, started on BID PO iron supplementation. However will also consider IV infusion after procedure today. No active bleeding noted.  Pregnancy - continue PNV as tolerated. Assess FHT daily. SCDs in place for DVT prophylaxis. Continue Zofran prn for nausea/vomiting prn. Has not had any further episodes of vomiting in 2 days.    A total of 15 minutes were spent face-to-face with the patient during this encounter.  Hildred Laser, MD [facility]

## 2021-03-06 NOTE — Progress Notes (Signed)
Was called to SDS to obtain FHT.  FHT 144 via doppler

## 2021-03-06 NOTE — Transfer of Care (Signed)
Immediate Anesthesia Transfer of Care Note  Patient: Laura Sosa  Procedure(s) Performed: CYSTOSCOPY/URETEROSCOPY/HOLMIUM LASER/STENT PLACEMENT (Left)  Patient Location: PACU  Anesthesia Type:General  Level of Consciousness: awake and drowsy  Airway & Oxygen Therapy: Patient Spontanous Breathing  Post-op Assessment: Report given to RN and Post -op Vital signs reviewed and stable  Post vital signs: Reviewed and stable  Last Vitals:  Vitals Value Taken Time  BP 103/67 03/06/21 1409  Temp 36.3 C 03/06/21 1409  Pulse 86 03/06/21 1414  Resp 20 03/06/21 1414  SpO2 92 % 03/06/21 1414  Vitals shown include unvalidated device data.  Last Pain:  Vitals:   03/06/21 1409  TempSrc:   PainSc: 0-No pain      Patients Stated Pain Goal: 0 (03/06/21 0725)  Complications: No notable events documented.

## 2021-03-06 NOTE — Discharge Instructions (Addendum)
You have a ureteral stent in place.  This is a tube that extends from your kidney to your bladder.  This may cause urinary bleeding, burning with urination, and urinary frequency.  Please call our office or present to the ED if you develop fevers >101 or pain which is not able to be controlled with oral pain medications.  You may be given either Flomax and/ or ditropan to help with bladder spasms and stent pain in addition to pain medications.    You may remove your stent on Wednesday.  It is taped to your left inner thigh.  You may untape the string and pulled gently until the entire stent is removed on this day.  If any difficulty whatsoever, please call our office.  [facility] [address], [address] [place], [place] [address] [phone]

## 2021-03-06 NOTE — Progress Notes (Signed)
FHR 144 bpm prior to surgery via doppler and Rolm Gala RN

## 2021-03-06 NOTE — Interval H&P Note (Signed)
History and Physical Interval Note:  03/06/2021 12:42 PM  Laura Sosa  has presented today for surgery, with the diagnosis of left ureteral stone.  The various methods of treatment have been discussed with the patient and family. After consideration of risks, benefits and other options for treatment, the patient has consented to  Procedure(s): CYSTOSCOPY/URETEROSCOPY/HOLMIUM LASER/STENT PLACEMENT (Left) as a surgical intervention.  The patient's history has been reviewed, patient examined, no change in status, stable for surgery.  I have reviewed the patient's chart and labs.  Questions were answered to the patient's satisfaction.    Poorly tolerated left ureteral stent.  Plan today for definitive management of the stone as previously discussed.  We reviewed again risk and benefits including possible but low risk of fetal compromise/loss.  We will try to minimize fluoroscopy and perform ultrasound guided ureteroscopy if possible.  All questions answered.  Plan for periprocedural ceftriaxone.  RRR CTAB   Vanna Scotland

## 2021-03-06 NOTE — Progress Notes (Signed)
FHT 118

## 2021-03-06 NOTE — Anesthesia Procedure Notes (Signed)
Procedure Name: Intubation Date/Time: 03/06/2021 1:27 PM Performed by: Cheral Bay, CRNA Pre-anesthesia Checklist: Patient identified, Emergency Drugs available, Suction available and Patient being monitored Patient Re-evaluated:Patient Re-evaluated prior to induction Oxygen Delivery Method: Circle system utilized Preoxygenation: Pre-oxygenation with 100% oxygen Induction Type: IV induction and Rapid sequence Laryngoscope Size: McGraph and 3 Grade View: Grade I Tube type: Oral Tube size: 6.5 mm Number of attempts: 1 Airway Equipment and Method: Stylet Placement Confirmation: ETT inserted through vocal cords under direct vision, positive ETCO2 and breath sounds checked- equal and bilateral Secured at: 21 cm Tube secured with: Tape Dental Injury: Teeth and Oropharynx as per pre-operative assessment

## 2021-03-07 ENCOUNTER — Encounter: Payer: Self-pay | Admitting: Urology

## 2021-03-07 ENCOUNTER — Other Ambulatory Visit: Payer: Self-pay

## 2021-03-07 DIAGNOSIS — O26832 Pregnancy related renal disease, second trimester: Secondary | ICD-10-CM | POA: Diagnosis not present

## 2021-03-07 DIAGNOSIS — N1330 Unspecified hydronephrosis: Secondary | ICD-10-CM | POA: Diagnosis not present

## 2021-03-07 DIAGNOSIS — Z3A22 22 weeks gestation of pregnancy: Secondary | ICD-10-CM | POA: Diagnosis not present

## 2021-03-07 DIAGNOSIS — B9620 Unspecified Escherichia coli [E. coli] as the cause of diseases classified elsewhere: Secondary | ICD-10-CM

## 2021-03-07 DIAGNOSIS — N200 Calculus of kidney: Secondary | ICD-10-CM

## 2021-03-07 DIAGNOSIS — O99891 Other specified diseases and conditions complicating pregnancy: Secondary | ICD-10-CM

## 2021-03-07 DIAGNOSIS — O2343 Unspecified infection of urinary tract in pregnancy, third trimester: Secondary | ICD-10-CM | POA: Diagnosis not present

## 2021-03-07 LAB — BASIC METABOLIC PANEL
Anion gap: 7 (ref 5–15)
BUN: 6 mg/dL (ref 6–20)
CO2: 25 mmol/L (ref 22–32)
Calcium: 7.5 mg/dL — ABNORMAL LOW (ref 8.9–10.3)
Chloride: 102 mmol/L (ref 98–111)
Creatinine, Ser: 0.65 mg/dL (ref 0.44–1.00)
GFR, Estimated: >60 mL/min (ref >60)
Glucose, Bld: 100 mg/dL — ABNORMAL HIGH (ref 70–99)
Potassium: 3.5 mmol/L (ref 3.5–5.1)
Sodium: 134 mmol/L — ABNORMAL LOW (ref 135–145)

## 2021-03-07 MED ORDER — OXYCODONE HCL 5 MG PO TABS
10.0000 mg | ORAL_TABLET | ORAL | Status: DC | PRN
  Administered 2021-03-07 – 2021-03-08 (×4): 10 mg via ORAL
  Filled 2021-03-07 (×3): qty 2

## 2021-03-07 MED ORDER — OXYCODONE HCL 5 MG PO TABS
5.0000 mg | ORAL_TABLET | ORAL | Status: DC | PRN
  Filled 2021-03-07 (×2): qty 1

## 2021-03-07 NOTE — Progress Notes (Signed)
Urology Inpatient Progress Note  Subjective: No acute events overnight. Creatinine stable today, 0.65. Patient reports significant improvement in her pain today, which she describes as manageable.  She states her symptoms are "night and day" compared to prior to ureteroscopy.  Anti-infectives: Anti-infectives (From admission, onward)    Start     Dose/Rate Route Frequency Ordered Stop   03/06/21 1330  cefTRIAXone (ROCEPHIN) 1 g in sodium chloride 0.9 % 100 mL IVPB        1 g 200 mL/hr over 30 Minutes Intravenous  Once 03/06/21 1242 03/06/21 1330   03/04/21 0945  cefTRIAXone (ROCEPHIN) 1 g in sodium chloride 0.9 % 100 mL IVPB        1 g 200 mL/hr over 30 Minutes Intravenous  Once 03/04/21 0942 03/04/21 1022       Current Facility-Administered Medications  Medication Dose Route Frequency Provider Last Rate Last Admin   0.9 %  sodium chloride infusion   Intravenous PRN Hildred Laser, MD       acetaminophen (TYLENOL) tablet 650 mg  650 mg Oral Q4H PRN Hildred Laser, MD   650 mg at 03/06/21 2045   albuterol (PROVENTIL) (2.5 MG/3ML) 0.083% nebulizer solution 2.5 mg  2.5 mg Nebulization Once PRN Hildred Laser, MD       belladonna-opium (B&O) suppository 16.2-60mg   1 suppository Rectal Q8H PRN Hildred Laser, MD   1 suppository at 03/06/21 0555   calcium carbonate (TUMS - dosed in mg elemental calcium) chewable tablet 400 mg of elemental calcium  2 tablet Oral Q4H PRN Hildred Laser, MD       calcium carbonate (TUMS - dosed in mg elemental calcium) chewable tablet 800 mg of elemental calcium  800 mg of elemental calcium Oral BID WC Hildred Laser, MD   800 mg of elemental calcium at 03/07/21 0746   diphenhydrAMINE (BENADRYL) injection 25 mg  25 mg Intravenous Once PRN Hildred Laser, MD       docusate sodium (COLACE) capsule 100 mg  100 mg Oral Daily Hildred Laser, MD   100 mg at 03/05/21 1638   EPINEPHrine (ADRENALIN) 0.3 mg  0.3 mg Intramuscular Once PRN Hildred Laser, MD       ferrous sulfate  tablet 325 mg  325 mg Oral BID WC Hildred Laser, MD   325 mg at 03/07/21 0746   hydrOXYzine (ATARAX/VISTARIL) tablet 25 mg  25 mg Oral TID PRN Hildred Laser, MD       lactated ringers infusion   Intravenous Continuous Hildred Laser, MD 125 mL/hr at 03/07/21 0312 New Bag at 03/07/21 4536   methylPREDNISolone sodium succinate (SOLU-MEDROL) 125 mg/2 mL injection 125 mg  125 mg Intravenous Once PRN Hildred Laser, MD       morphine 2 MG/ML injection 2 mg  2 mg Intravenous Q3H PRN Hildred Laser, MD   2 mg at 03/07/21 0445   ondansetron (ZOFRAN-ODT) disintegrating tablet 4 mg  4 mg Oral Q8H PRN Hildred Laser, MD   4 mg at 03/06/21 2200   oxyCODONE (Oxy IR/ROXICODONE) immediate release tablet 5 mg  5 mg Oral Q3H PRN Hildred Laser, MD   5 mg at 03/07/21 0746   prenatal vitamin w/FE, FA (PRENATAL 1 + 1) 27-1 MG tablet 1 tablet  1 tablet Oral Q1200 Hildred Laser, MD       sodium chloride 0.9 % bolus 500 mL  500 mL Intravenous Once PRN Hildred Laser, MD       zolpidem (AMBIEN) tablet 5 mg  5 mg  Oral QHS PRN Hildred Laser, MD       Objective: Vital signs in last 24 hours: Temp:  [97.3 F (36.3 C)-98.3 F (36.8 C)] 97.7 F (36.5 C) (07/12 0727) Pulse Rate:  [58-99] 62 (07/12 0727) Resp:  [16-29] 18 (07/12 0727) BP: (96-117)/(57-75) 96/61 (07/12 0727) SpO2:  [90 %-96 %] 95 % (07/12 0727)  Intake/Output from previous day: 07/11 0701 - 07/12 0700 In: 3833.4 [I.V.:3163.4; IV Piggyback:670] Out: 2050 [Urine:2050] Intake/Output this shift: Total I/O In: -  Out: 250 [Urine:250]  Physical Exam Vitals and nursing note reviewed.  Constitutional:      General: She is not in acute distress.    Appearance: She is not ill-appearing, toxic-appearing or diaphoretic.  HENT:     Head: Normocephalic and atraumatic.  Pulmonary:     Effort: Pulmonary effort is normal. No respiratory distress.  Skin:    General: Skin is warm and dry.  Neurological:     Mental Status: She is alert and oriented to person,  place, and time.  Psychiatric:        Mood and Affect: Mood normal.        Behavior: Behavior normal.   Lab Results:  Recent Labs    03/05/21 0538 03/06/21 0559  WBC 16.2* 17.3*  HGB 8.6* 8.1*  HCT 24.9* 23.5*  PLT 316 363   BMET Recent Labs    03/06/21 0559 03/06/21 1209 03/07/21 0706  NA 134*  --  134*  K 2.8* 3.0* 3.5  CL 101  --  102  CO2 26  --  25  GLUCOSE 61*  --  100*  BUN <5*  --  6  CREATININE 0.69  --  0.65  CALCIUM 7.3*  --  7.5*   Assessment & Plan: 35 year old pregnant female at [redacted]w[redacted]d now POD1 from left ureteroscopy with laser lithotripsy and stent exchange with Dr. Apolinar Junes for management of a 7 mm renal stone.  Patient clinically improving today and pain is controllable.  We discussed that she may remove her stent tomorrow.  Okay for discharge today given appropriate pain control.  Carman Ching, PA-C 03/07/2021

## 2021-03-07 NOTE — Progress Notes (Signed)
Antenatal Progress Note  Subjective:     Patient ID: Laura Sosa is a 35 y.o. female [redacted]w[redacted]d, Estimated Date of Delivery: 07/05/21 by 1st trimester sono (unknown LMP) who was admitted for pain management secondary to left renal stone, currently with left ureteral stent in place, E. Coli UTI.  HD# 4. Now POD#1 s/p left ureteroscopy with laser lithotripsy, and stent exchange.  Subjective:  Patient denies any major complaints. Pain is much better. Notes just mild aggravation from the stent.  Is tolerating more PO intake. Denies fevers, chills.   Review of Systems Denies contractions, leakage of fluids, vaginal bleeding, and reports good fetal movement.     Objective:   Vitals:   03/06/21 1921 03/06/21 2303 03/07/21 0309 03/07/21 0727  BP: 105/72 104/72 105/66 96/61  Pulse: 62 (!) 58 66 62  Resp: 20 18 18 18   Temp: 97.6 F (36.4 C) (!) 97.5 F (36.4 C) 97.7 F (36.5 C) 97.7 F (36.5 C)  TempSrc: Oral Oral Oral Oral  SpO2: 95% 94% 93% 95%  Weight:      Height:        General appearance: alert and no distress Lungs: clear to auscultation bilaterally Heart: regular rate and rhythm, S1, S2 normal, no murmur, click, rub or gallop Abdomen: soft, non-tender; bowel sounds normal; no masses,  no organomegaly. Gravid. Mild swelling of left side/flank.  Pelvic: deferred Extremities: extremities normal, atraumatic, no cyanosis or edema   FHT:  152 bpm    Labs:  CBC Latest Ref Rng & Units 03/06/2021 03/05/2021 03/04/2021  WBC 4.0 - 10.5 K/uL 17.3(H) 16.2(H) 15.8(H)  Hemoglobin 12.0 - 15.0 g/dL 8.1(L) 8.6(L) 9.5(L)  Hematocrit 36.0 - 46.0 % 23.5(L) 24.9(L) 27.0(L)  Platelets 150 - 400 K/uL 363 316 291    CMP Latest Ref Rng & Units 03/07/2021 03/06/2021 03/06/2021  Glucose 70 - 99 mg/dL 05/07/2021) - 193(X)  BUN 6 - 20 mg/dL 6 - 90(W)  Creatinine <4(O - 1.00 mg/dL 9.73 - 5.32  Sodium 9.92 - 145 mmol/L 134(L) - 134(L)  Potassium 3.5 - 5.1 mmol/L 3.5 3.0(L) 2.8(L)  Chloride 98 - 111 mmol/L  102 - 101  CO2 22 - 32 mmol/L 25 - 26  Calcium 8.9 - 10.3 mg/dL 7.5(L) - 7.3(L)  Total Protein 6.5 - 8.1 g/dL - - -  Total Bilirubin 0.3 - 1.2 mg/dL - - -  Alkaline Phos 38 - 126 U/L - - -  AST 15 - 41 U/L - - -  ALT 0 - 44 U/L - - -    Assessment:   426 R Salais is a 35 y.o. 34 at 101w6d admitted for pain management for left ureteral stone, E. Coli UTI, with left ureteral stent in place.  Now POD#1 s/p  s/p left ureteroscopy with laser lithotripsy, and stent exchange.  Also with hypokalemia (resolved), hypocalcemia, and nausea/vomiting of pregnancy.   Plan:  S/p removal of ureteral stone and stent exchange by Urology. Patient stable, can f/u outpatient with them in 4 weeks with renal ultrasound. Can remove stent today or tomorrow depending on patient's day of discharge.  Pain management with PO Tylenol and Oxycodone, Can d/c all other bladder medications and IV pain meds at this time.  Hypokalemia - has normalized. Can discontinue supplementation at this time.  Hypocalcemia - patient with moderate hypocalcemia, strongly encouraged use of Tums, until she can better tolerate PO.  Anemia of pregnancy, on BID PO iron supplementation. Also received iron infusion overnight.  Will f/u levels  as outpatient.  Pregnancy - continue PNV as tolerated. Assess FHT daily. SCDs in place for DVT prophylaxis when not ambulating. Continue Zofran prn for nausea/vomiting prn. Is better tolerating PO, full diet.   Will likely d/c home later today. To remove stent prior to discharge.     A total of 15 minutes were spent face-to-face with the patient during this encounter.  Hildred Laser, MD [facility]

## 2021-03-08 DIAGNOSIS — Z3A22 22 weeks gestation of pregnancy: Secondary | ICD-10-CM | POA: Diagnosis not present

## 2021-03-08 DIAGNOSIS — B9620 Unspecified Escherichia coli [E. coli] as the cause of diseases classified elsewhere: Secondary | ICD-10-CM | POA: Diagnosis not present

## 2021-03-08 DIAGNOSIS — N1330 Unspecified hydronephrosis: Secondary | ICD-10-CM | POA: Diagnosis not present

## 2021-03-08 DIAGNOSIS — O99891 Other specified diseases and conditions complicating pregnancy: Secondary | ICD-10-CM | POA: Diagnosis not present

## 2021-03-08 DIAGNOSIS — O2343 Unspecified infection of urinary tract in pregnancy, third trimester: Secondary | ICD-10-CM | POA: Diagnosis not present

## 2021-03-08 DIAGNOSIS — N200 Calculus of kidney: Secondary | ICD-10-CM | POA: Diagnosis not present

## 2021-03-08 MED ORDER — ONDANSETRON 4 MG PO TBDP: 4.0000 mg | ORAL_TABLET | Freq: Three times a day (TID) | ORAL | 0 refills | Status: DC | PRN

## 2021-03-08 MED ORDER — HYDROXYZINE HCL 25 MG PO TABS: 25.0000 mg | ORAL_TABLET | Freq: Three times a day (TID) | ORAL | 0 refills | Status: DC | PRN

## 2021-03-08 MED ORDER — DOCUSATE SODIUM 100 MG PO CAPS: 100.0000 mg | ORAL_CAPSULE | Freq: Two times a day (BID) | ORAL | 1 refills | Status: DC | PRN

## 2021-03-08 MED ORDER — FERROUS SULFATE 325 (65 FE) MG PO TABS: 325.0000 mg | ORAL_TABLET | Freq: Two times a day (BID) | ORAL | 3 refills | Status: DC

## 2021-03-08 MED ORDER — ACETAMINOPHEN 325 MG PO TABS
650.0000 mg | ORAL_TABLET | ORAL | 0 refills | Status: DC | PRN
Start: 2021-03-08 — End: 2021-06-21

## 2021-03-08 NOTE — Discharge Summary (Addendum)
Physician Discharge Summary  Patient ID: Laura Sosa MRN: [id number] DOB/AGE: [birth year] 33 y.o.  Admit date: 03/04/2021 Discharge date: 03/08/2021  Admission Diagnoses:  Discharge Diagnoses:  Active Problems:   History of pyelonephritis   Kidney stones   Nausea and vomiting of pregnancy, antepartum   Status post placement of ureteral stent   E. coli UTI (urinary tract infection)   Hypokalemia   Hypocalcemia   Anemia of pregnancy in second trimester   Discharged Condition: good  Hospital Course:  The patient was admitted from the hospital secondary to persistent pain s/p left ureteral stent placement due to obstructing renal stone. Also with continued nausea/vomiting of pregnancy.  Of note, patient had been discharged 2 days prior from similar admission as her pain had improved.   On admission she was also noted to have some complaints of chest discomfort.  Her potassium was noted to be 2.5.  She received daily runs of IV potassium ( 40 mEQ total) x 2 days, with an additional 3 runs (total 30 mEQ) on the following day. along with PO supplementation. She was treated with IV, PO and PR pain medications and antiemetics.  On HD#3, she underwent left ureteroscopy with stent exchange and laser lithotripsy of of the stone.  She had received daily IV dosing of Ceftriaxone from admission until procedure. By HD#4 patient was noted to be able to tolerate some solids, and pain significantly improved after stent removal.  She was allowed to discharge home.   Consults: urology  Significant Diagnostic Studies: labs: see below and microbiology: urine culture: negative  CBC Latest Ref Rng & Units 03/06/2021 03/05/2021 03/04/2021  WBC 4.0 - 10.5 K/uL 17.3(H) 16.2(H) 15.8(H)  Hemoglobin 12.0 - 15.0 g/dL 8.1(L) 8.6(L) 9.5(L)  Hematocrit 36.0 - 46.0 % 23.5(L) 24.9(L) 27.0(L)  Platelets 150 - 400 K/uL 363 316 291     CMP Latest Ref Rng & Units 03/07/2021 03/06/2021 03/06/2021  Glucose 70 - 99 mg/dL  409(W) - 11(B)  BUN 6 - 20 mg/dL 6 - <1(Y)  Creatinine 7.82 - 1.00 mg/dL 9.56 - 2.13  Sodium 086 - 145 mmol/L 134(L) - 134(L)  Potassium 3.5 - 5.1 mmol/L 3.5 3.0(L) 2.8(L)  Chloride 98 - 111 mmol/L 102 - 101  CO2 22 - 32 mmol/L 25 - 26  Calcium 8.9 - 10.3 mg/dL 7.5(L) - 7.3(L)  Total Protein 6.5 - 8.1 g/dL - - -  Total Bilirubin 0.3 - 1.2 mg/dL - - -  Alkaline Phos 38 - 126 U/L - - -  AST 15 - 41 U/L - - -  ALT 0 - 44 U/L - - -    Results for orders placed or performed during the hospital encounter of 03/04/21  Urine culture     Status: Abnormal   Collection Time: 03/04/21  9:16 AM   Specimen: Urine, Random  Result Value Ref Range Status   Specimen Description   Final    URINE, RANDOM Performed at [facility], [address], [place], [place] [address]    Special Requests   Final    NONE Performed at [facility], [address] [place], [place] [address]    Culture (A)  Final    <10,000 COLONIES/mL INSIGNIFICANT GROWTH Performed at [facility], [address] [place], [place] [address]    Report Status 03/05/2021 FINAL  Final  Resp Panel by RT-PCR (Flu A&B, Covid) Nasopharyngeal Swab     Status: None   Collection Time: 03/04/21  9:50  AM   Specimen: Nasopharyngeal Swab; Nasopharyngeal(NP) swabs in vial transport medium  Result Value Ref Range Status   SARS Coronavirus 2 by RT PCR NEGATIVE NEGATIVE Final    Comment: (NOTE) SARS-CoV-2 target nucleic acids are NOT DETECTED.  The SARS-CoV-2 RNA is generally detectable in upper respiratory specimens during the acute phase of infection. The lowest concentration of SARS-CoV-2 viral copies this assay can detect is 138 copies/mL. A negative result does not preclude SARS-Cov-2 infection and should not be used as the sole basis for treatment or other patient management decisions. A negative result may occur with  improper specimen collection/handling, submission of specimen other than nasopharyngeal  swab, presence of viral mutation(s) within the areas targeted by this assay, and inadequate number of viral copies(<138 copies/mL). A negative result must be combined with clinical observations, patient history, and epidemiological information. The expected result is Negative.  Fact Sheet for Patients:  BloggerCourse.com  Fact Sheet for Healthcare Providers:  SeriousBroker.it  This test is no t yet approved or cleared by the [place] FDA and  has been authorized for detection and/or diagnosis of SARS-CoV-2 by FDA under an Emergency Use Authorization (EUA). This EUA will remain  in effect (meaning this test can be used) for the duration of the COVID-19 declaration under Section 564(b)(1) of the Act, 21 U.S.C.section 360bbb-3(b)(1), unless the authorization is terminated  or revoked sooner.       Influenza A by PCR NEGATIVE NEGATIVE Final   Influenza B by PCR NEGATIVE NEGATIVE Final    Comment: (NOTE) The Xpert Xpress SARS-CoV-2/FLU/RSV plus assay is intended as an aid in the diagnosis of influenza from Nasopharyngeal swab specimens and should not be used as a sole basis for treatment. Nasal washings and aspirates are unacceptable for Xpert Xpress SARS-CoV-2/FLU/RSV testing.  Fact Sheet for Patients: BloggerCourse.com  Fact Sheet for Healthcare Providers: SeriousBroker.it  This test is not yet approved or cleared by the [place] FDA and has been authorized for detection and/or diagnosis of SARS-CoV-2 by FDA under an Emergency Use Authorization (EUA). This EUA will remain in effect (meaning this test can be used) for the duration of the COVID-19 declaration under Section 564(b)(1) of the Act, 21 U.S.C. section 360bbb-3(b)(1), unless the authorization is terminated or revoked.  Performed at [facility], [address] [place], [place] [address]      Treatments: IV hydration, antibiotics: ceftriaxone, analgesia: acetaminophen, Morphine, and Percocet. Also used oxybutynin, Flomax, and belladona suppositories for stent pain. and surgery: eft ureteroscopy with stent exchange and laser lithotripsy  Discharge Exam: Blood pressure 109/66, pulse 93, temperature 98 F (36.7 C), temperature source Oral, resp. rate 18, height 5\' 3"  (1.6 m), weight 74.8 kg, SpO2 92 %. General appearance: alert and no distress Lungs: clear to auscultation bilaterally Heart: regular rate and rhythm, S1, S2 normal, no murmur, click, rub or gallop Abdomen: soft, non-tender; bowel sounds normal; no masses,  no organomegaly. Gravid. Mild swelling of left side/flank. Pelvic: deferred Extremities: extremities normal, atraumatic, no cyanosis or edema    Disposition: Discharge disposition: 01-Home or Self Care       Discharge Instructions     Discharge patient   Complete by: As directed    Discharge disposition: 01-Home or Self Care   Discharge patient date: 03/08/2021      Allergies as of 03/08/2021   No Known Allergies      Medication List     STOP taking these medications    oxybutynin 5 MG tablet Commonly  known as: DITROPAN   tamsulosin 0.4 MG Caps capsule Commonly known as: FLOMAX       TAKE these medications    acetaminophen 325 MG tablet Commonly known as: TYLENOL Take 2 tablets (650 mg total) by mouth every 4 (four) hours as needed (for pain scale < 4  OR  temperature  >/=  100.5 F).   docusate sodium 100 MG capsule Commonly known as: COLACE Take 1 capsule (100 mg total) by mouth 2 (two) times daily as needed for mild constipation.   ferrous sulfate 325 (65 FE) MG tablet Take 1 tablet (325 mg total) by mouth 2 (two) times daily with a meal.   hydrOXYzine 25 MG tablet Commonly known as: ATARAX/VISTARIL Take 1 tablet (25 mg total) by mouth 3 (three) times daily as needed for anxiety, nausea or vomiting (urinary spasm, sleep  difficulties).   multivitamin-prenatal 27-0.8 MG Tabs tablet Take 1 tablet by mouth daily at 12 noon.   ondansetron 4 MG disintegrating tablet Commonly known as: ZOFRAN-ODT Take 1 tablet (4 mg total) by mouth every 8 (eight) hours as needed for nausea or vomiting.   oxyCODONE 5 MG immediate release tablet Commonly known as: Oxy IR/ROXICODONE Take 1 tablet (5 mg total) by mouth every 3 (three) hours as needed for moderate pain or severe pain.        Follow-up Information     Vanna Scotland, MD Follow up in 4 week(s).   Specialty: Urology Why: For follow up with renal ultrasound prior to visit Contact information: [address] [place] [address] [phone]         Linzie Collin, MD Follow up.   Specialties: Obstetrics and Gynecology, Radiology Why: Has appointment already scheduled for 03/23/2021. Contact information: [address] [place] [address] [phone]                 Signed: Hildred Laser, MD 03/08/2021, 8:08 AM

## 2021-03-08 NOTE — Progress Notes (Signed)
Pt discharged home.  Discharge instructions, prescriptions and follow up appointment given to and reviewed with pt.  Pt verbalized understanding.  Escorted by auxillary. 

## 2021-03-08 NOTE — Progress Notes (Signed)
Urology Inpatient Progress Note  Subjective: No acute events overnight. She remains afebrile, VSS. Patient only received oxycodone for stent pain yesterday and reports worsened discomfort as a result. She was given a B&O suppository this AM.  Anti-infectives: Anti-infectives (From admission, onward)    Start     Dose/Rate Route Frequency Ordered Stop   03/06/21 1330  cefTRIAXone (ROCEPHIN) 1 g in sodium chloride 0.9 % 100 mL IVPB        1 g 200 mL/hr over 30 Minutes Intravenous  Once 03/06/21 1242 03/06/21 1330   03/04/21 0945  cefTRIAXone (ROCEPHIN) 1 g in sodium chloride 0.9 % 100 mL IVPB        1 g 200 mL/hr over 30 Minutes Intravenous  Once 03/04/21 0942 03/04/21 1022       Current Facility-Administered Medications  Medication Dose Route Frequency Provider Last Rate Last Admin   acetaminophen (TYLENOL) tablet 650 mg  650 mg Oral Q4H PRN Hildred Laser, MD   650 mg at 03/08/21 0742   albuterol (PROVENTIL) (2.5 MG/3ML) 0.083% nebulizer solution 2.5 mg  2.5 mg Nebulization Once PRN Hildred Laser, MD       belladonna-opium (B&O) suppository 16.2-60mg   1 suppository Rectal Q8H PRN Hildred Laser, MD   1 suppository at 03/08/21 3734   calcium carbonate (TUMS - dosed in mg elemental calcium) chewable tablet 400 mg of elemental calcium  2 tablet Oral Q4H PRN Hildred Laser, MD       calcium carbonate (TUMS - dosed in mg elemental calcium) chewable tablet 800 mg of elemental calcium  800 mg of elemental calcium Oral BID WC Hildred Laser, MD   800 mg of elemental calcium at 03/08/21 0742   diphenhydrAMINE (BENADRYL) injection 25 mg  25 mg Intravenous Once PRN Hildred Laser, MD       docusate sodium (COLACE) capsule 100 mg  100 mg Oral Daily Hildred Laser, MD   100 mg at 03/05/21 2876   ferrous sulfate tablet 325 mg  325 mg Oral BID WC Hildred Laser, MD   325 mg at 03/08/21 8115   hydrOXYzine (ATARAX/VISTARIL) tablet 25 mg  25 mg Oral TID PRN Hildred Laser, MD       ondansetron (ZOFRAN-ODT)  disintegrating tablet 4 mg  4 mg Oral Q8H PRN Hildred Laser, MD   4 mg at 03/06/21 2200   oxyCODONE (Oxy IR/ROXICODONE) immediate release tablet 10 mg  10 mg Oral Q4H PRN Hildred Laser, MD   10 mg at 03/08/21 7262   oxyCODONE (Oxy IR/ROXICODONE) immediate release tablet 5 mg  5 mg Oral Q4H PRN Hildred Laser, MD       prenatal vitamin w/FE, FA (PRENATAL 1 + 1) 27-1 MG tablet 1 tablet  1 tablet Oral Q1200 Hildred Laser, MD   1 tablet at 03/07/21 1221   zolpidem (AMBIEN) tablet 5 mg  5 mg Oral QHS PRN Hildred Laser, MD         Objective: Vital signs in last 24 hours: Temp:  [97.8 F (36.6 C)-98 F (36.7 C)] 98 F (36.7 C) (07/12 2233) Pulse Rate:  [67-93] 93 (07/12 2233) Resp:  [18] 18 (07/12 2233) BP: (95-109)/(60-66) 109/66 (07/12 2233) SpO2:  [92 %] 92 % (07/12 2233)  Intake/Output from previous day: 07/12 0701 - 07/13 0700 In: 905.9 [P.O.:120; I.V.:785.9] Out: 250 [Urine:250] Intake/Output this shift: No intake/output data recorded.  Physical Exam Vitals and nursing note reviewed.  Constitutional:      General: She is not in acute distress.  Appearance: She is not ill-appearing, toxic-appearing or diaphoretic.  HENT:     Head: Normocephalic and atraumatic.  Pulmonary:     Effort: Pulmonary effort is normal. No respiratory distress.  Skin:    General: Skin is warm and dry.  Neurological:     Mental Status: She is alert and oriented to person, place, and time.  Psychiatric:        Mood and Affect: Mood normal.        Behavior: Behavior normal.    Lab Results:  Recent Labs    03/06/21 0559  WBC 17.3*  HGB 8.1*  HCT 23.5*  PLT 363   BMET Recent Labs    03/06/21 0559 03/06/21 1209 03/07/21 0706  NA 134*  --  134*  K 2.8* 3.0* 3.5  CL 101  --  102  CO2 26  --  25  GLUCOSE 61*  --  100*  BUN <5*  --  6  CREATININE 0.69  --  0.65  CALCIUM 7.3*  --  7.5*   Assessment & Plan: 35 year old pregnant female at [redacted]w[redacted]d now POD 2 from left ureteroscopy with laser  lithotripsy and stent exchange with Dr. Apolinar Junes for management of a 7 mm left renal stone.  I removed her stent at the bedside this morning without difficulty.  Patient tolerated well.  Okay for discharge from the urologic perspective with plans for outpatient follow-up with Dr. Apolinar Junes in 4 weeks with renal ultrasound prior.  Carman Ching, PA-C 03/08/2021

## 2021-03-08 NOTE — Progress Notes (Signed)
Antenatal Progress Note  Subjective:     Patient ID: [place] R Fanning is a 35 y.o. female [redacted]w[redacted]d, Estimated Date of Delivery: 07/05/21 by 1st trimester sono (unknown LMP) who was admitted for pain management secondary to left renal stone, currently with left ureteral stent in place (exchanged), resolved E. Coli UTI.  HD# 5. Now POD#2 s/p left ureteroscopy with laser lithotripsy, and stent exchange.  Subjective:  Patient reports moderate pain on her left side. Notes she did not sleep much last night.  Awaiting pain medication this morning.  Denies fevers, chills.   Review of Systems Denies contractions, leakage of fluids, vaginal bleeding, and reports good fetal movement.     Objective:   Vitals:   03/07/21 0727 03/07/21 1142 03/07/21 1621 03/07/21 2233  BP: 96/61 95/60 103/60 109/66  Pulse: 62 67 77 93  Resp: 18 18 18 18   Temp: 97.7 F (36.5 C) 97.8 F (36.6 C) 97.8 F (36.6 C) 98 F (36.7 C)  TempSrc: Oral Oral Oral Oral  SpO2: 95%   92%  Weight:      Height:        General appearance: alert and no distress Lungs: clear to auscultation bilaterally Heart: regular rate and rhythm, S1, S2 normal, no murmur, click, rub or gallop Abdomen: soft, non-tender; bowel sounds normal; no masses,  no organomegaly. Gravid. Mild swelling of left side/flank.  Pelvic: deferred Extremities: extremities normal, atraumatic, no cyanosis or edema   FHT:  not obtained yet this morning    Labs:  CBC Latest Ref Rng & Units 03/06/2021 03/05/2021 03/04/2021  WBC 4.0 - 10.5 K/uL 17.3(H) 16.2(H) 15.8(H)  Hemoglobin 12.0 - 15.0 g/dL 8.1(L) 8.6(L) 9.5(L)  Hematocrit 36.0 - 46.0 % 23.5(L) 24.9(L) 27.0(L)  Platelets 150 - 400 K/uL 363 316 291    CMP Latest Ref Rng & Units 03/07/2021 03/06/2021 03/06/2021  Glucose 70 - 99 mg/dL 05/07/2021) - 937(J)  BUN 6 - 20 mg/dL 6 - 69(C)  Creatinine <7(E - 1.00 mg/dL 9.38 - 1.01  Sodium 7.51 - 145 mmol/L 134(L) - 134(L)  Potassium 3.5 - 5.1 mmol/L 3.5 3.0(L) 2.8(L)   Chloride 98 - 111 mmol/L 102 - 101  CO2 22 - 32 mmol/L 25 - 26  Calcium 8.9 - 10.3 mg/dL 7.5(L) - 7.3(L)  Total Protein 6.5 - 8.1 g/dL - - -  Total Bilirubin 0.3 - 1.2 mg/dL - - -  Alkaline Phos 38 - 126 U/L - - -  AST 15 - 41 U/L - - -  ALT 0 - 44 U/L - - -    Assessment:   025 R Dartt is a 35 y.o. 34 at 108w0d admitted for pain management for left ureteral stone, E. Coli UTI (resolved), with left ureteral stent in place.  Now POD#2 s/p  s/p left ureteroscopy with laser lithotripsy, and stent exchange.  Also with hypokalemia (resolved), hypocalcemia, and mild nausea/vomiting of pregnancy.   Plan:  S/p removal of ureteral stone and stent exchange. Awaiting Urology rounds for stent removal. Patient stable, can f/u outpatient with them in 4 weeks with renal ultrasound. Pain should resolve after removal.  Hypokalemia - has normalized.   Hypocalcemia - continue use of Tums, until she can better tolerate PO.  Anemia of pregnancy, on BID PO iron supplementation. S/p iron infusion this admission. Will f/u levels as outpatient.  Pregnancy - continue PNV as tolerated. Assess FHT daily. Regular diet.   Will d/c home today.      A total of 15  minutes were spent face-to-face with the patient during this encounter.  Hildred Laser, MD [facility]

## 2021-03-09 ENCOUNTER — Other Ambulatory Visit: Payer: Self-pay

## 2021-03-09 ENCOUNTER — Ambulatory Visit (INDEPENDENT_AMBULATORY_CARE_PROVIDER_SITE_OTHER): Payer: Medicaid Other | Admitting: Obstetrics and Gynecology

## 2021-03-09 ENCOUNTER — Encounter: Payer: Self-pay | Admitting: Obstetrics and Gynecology

## 2021-03-09 VITALS — BP 117/79 | HR 93 | Wt 176.6 lb

## 2021-03-09 DIAGNOSIS — Z3482 Encounter for supervision of other normal pregnancy, second trimester: Secondary | ICD-10-CM

## 2021-03-09 DIAGNOSIS — Z3A23 23 weeks gestation of pregnancy: Secondary | ICD-10-CM

## 2021-03-09 LAB — POCT URINALYSIS DIPSTICK OB
Bilirubin, UA: NEGATIVE
Glucose, UA: NEGATIVE
Ketones, UA: NEGATIVE
Leukocytes, UA: NEGATIVE
Nitrite, UA: NEGATIVE
POC,PROTEIN,UA: NEGATIVE
Spec Grav, UA: <=1.005 — AB (ref 1.010–1.025)
Urobilinogen, UA: 0.2 E.U./dL
pH, UA: 7 (ref 5.0–8.0)

## 2021-03-09 NOTE — Progress Notes (Signed)
ROB: Patient slowly recovering from stent placement and pyelonephritis from kidney stone.  Stent is now out.  She continues to take antibiotics and oxycodone.  I have advised her to wean from the oxycodone as her pain improves.  Reports daily fetal movement.

## 2021-03-13 ENCOUNTER — Emergency Department: Payer: Medicaid Other

## 2021-03-13 ENCOUNTER — Ambulatory Visit: Payer: Medicaid Other

## 2021-03-13 ENCOUNTER — Encounter: Payer: Self-pay | Admitting: Urology

## 2021-03-13 ENCOUNTER — Other Ambulatory Visit: Payer: Self-pay

## 2021-03-13 ENCOUNTER — Emergency Department
Admission: EM | Admit: 2021-03-13 | Discharge: 2021-03-14 | Disposition: A | Discharge status: Home / Self Care | Payer: Medicaid Other | Attending: Emergency Medicine | Admitting: Emergency Medicine

## 2021-03-13 DIAGNOSIS — M25552 Pain in left hip: Secondary | ICD-10-CM | POA: Insufficient documentation

## 2021-03-13 DIAGNOSIS — Z3A25 25 weeks gestation of pregnancy: Secondary | ICD-10-CM | POA: Diagnosis not present

## 2021-03-13 DIAGNOSIS — R0602 Shortness of breath: Secondary | ICD-10-CM | POA: Diagnosis not present

## 2021-03-13 DIAGNOSIS — R0781 Pleurodynia: Secondary | ICD-10-CM | POA: Insufficient documentation

## 2021-03-13 DIAGNOSIS — I517 Cardiomegaly: Secondary | ICD-10-CM | POA: Diagnosis not present

## 2021-03-13 DIAGNOSIS — R079 Chest pain, unspecified: Secondary | ICD-10-CM | POA: Diagnosis not present

## 2021-03-13 DIAGNOSIS — N200 Calculus of kidney: Secondary | ICD-10-CM

## 2021-03-13 DIAGNOSIS — N3000 Acute cystitis without hematuria: Secondary | ICD-10-CM | POA: Insufficient documentation

## 2021-03-13 DIAGNOSIS — O2312 Infections of bladder in pregnancy, second trimester: Secondary | ICD-10-CM | POA: Insufficient documentation

## 2021-03-13 DIAGNOSIS — G8918 Other acute postprocedural pain: Secondary | ICD-10-CM | POA: Insufficient documentation

## 2021-03-13 DIAGNOSIS — J90 Pleural effusion, not elsewhere classified: Secondary | ICD-10-CM | POA: Diagnosis not present

## 2021-03-13 DIAGNOSIS — R0789 Other chest pain: Secondary | ICD-10-CM | POA: Diagnosis not present

## 2021-03-13 DIAGNOSIS — I313 Pericardial effusion (noninflammatory): Secondary | ICD-10-CM | POA: Diagnosis not present

## 2021-03-13 DIAGNOSIS — Z20822 Contact with and (suspected) exposure to covid-19: Secondary | ICD-10-CM | POA: Insufficient documentation

## 2021-03-13 LAB — CBC WITH DIFFERENTIAL/PLATELET
Abs Immature Granulocytes: 0.3 10*3/uL — ABNORMAL HIGH (ref 0.00–0.07)
Basophils Absolute: 0 10*3/uL (ref 0.0–0.1)
Basophils Relative: 0 %
Eosinophils Absolute: 0 10*3/uL (ref 0.0–0.5)
Eosinophils Relative: 0 %
HCT: 28.1 % — ABNORMAL LOW (ref 36.0–46.0)
Hemoglobin: 9.1 g/dL — ABNORMAL LOW (ref 12.0–15.0)
Immature Granulocytes: 2 %
Lymphocytes Relative: 11 %
Lymphs Abs: 1.7 10*3/uL (ref 0.7–4.0)
MCH: 30.1 pg (ref 26.0–34.0)
MCHC: 32.4 g/dL (ref 30.0–36.0)
MCV: 93 fL (ref 80.0–100.0)
Monocytes Absolute: 0.6 10*3/uL (ref 0.1–1.0)
Monocytes Relative: 4 %
Neutro Abs: 12.5 10*3/uL — ABNORMAL HIGH (ref 1.7–7.7)
Neutrophils Relative %: 83 %
Platelets: 470 10*3/uL — ABNORMAL HIGH (ref 150–400)
RBC: 3.02 MIL/uL — ABNORMAL LOW (ref 3.87–5.11)
RDW: 13.3 % (ref 11.5–15.5)
WBC: 15.2 10*3/uL — ABNORMAL HIGH (ref 4.0–10.5)
nRBC: 0 % (ref 0.0–0.2)

## 2021-03-13 LAB — URINALYSIS, COMPLETE (UACMP) WITH MICROSCOPIC
Bilirubin Urine: NEGATIVE
Glucose, UA: NEGATIVE mg/dL
Ketones, ur: NEGATIVE mg/dL
Nitrite: NEGATIVE
Protein, ur: 100 mg/dL — AB
Specific Gravity, Urine: 1.017 (ref 1.005–1.030)
WBC, UA: >50 WBC/hpf — ABNORMAL HIGH (ref 0–5)
pH: 8 (ref 5.0–8.0)

## 2021-03-13 LAB — RESP PANEL BY RT-PCR (FLU A&B, COVID) ARPGX2
Influenza A by PCR: NEGATIVE
Influenza B by PCR: NEGATIVE
SARS Coronavirus 2 by RT PCR: NEGATIVE

## 2021-03-13 LAB — BASIC METABOLIC PANEL
Anion gap: 8 (ref 5–15)
BUN: 7 mg/dL (ref 6–20)
CO2: 24 mmol/L (ref 22–32)
Calcium: 8.1 mg/dL — ABNORMAL LOW (ref 8.9–10.3)
Chloride: 104 mmol/L (ref 98–111)
Creatinine, Ser: 0.68 mg/dL (ref 0.44–1.00)
GFR, Estimated: >60 mL/min (ref >60)
Glucose, Bld: 73 mg/dL (ref 70–99)
Potassium: 3.2 mmol/L — ABNORMAL LOW (ref 3.5–5.1)
Sodium: 136 mmol/L (ref 135–145)

## 2021-03-13 LAB — D-DIMER, QUANTITATIVE: D-Dimer, Quant: 2.1 ug/mL-FEU — ABNORMAL HIGH (ref 0.00–0.50)

## 2021-03-13 LAB — TROPONIN I (HIGH SENSITIVITY): Troponin I (High Sensitivity): 6 ng/L (ref <18)

## 2021-03-13 MED ORDER — HYDROMORPHONE HCL 1 MG/ML IJ SOLN
1.0000 mg | Freq: Once | INTRAMUSCULAR | Status: AC
  Administered 2021-03-13: 1 mg via INTRAVENOUS
  Filled 2021-03-13: qty 1

## 2021-03-13 MED ORDER — LIDOCAINE 5 % EX PTCH
1.0000 | MEDICATED_PATCH | Freq: Once | CUTANEOUS | Status: DC
  Administered 2021-03-13: 1 via TRANSDERMAL
  Filled 2021-03-13: qty 1

## 2021-03-13 MED ORDER — CEPHALEXIN 500 MG PO CAPS: 500.0000 mg | ORAL_CAPSULE | Freq: Four times a day (QID) | ORAL | 0 refills | Status: DC

## 2021-03-13 MED ORDER — IOHEXOL 350 MG/ML SOLN
75.0000 mL | Freq: Once | INTRAVENOUS | Status: AC | PRN
  Administered 2021-03-13: 75 mL via INTRAVENOUS

## 2021-03-13 MED ORDER — SODIUM CHLORIDE 0.9 % IV SOLN
1.0000 g | Freq: Once | INTRAVENOUS | Status: AC
  Administered 2021-03-13: 1 g via INTRAVENOUS
  Filled 2021-03-13: qty 10

## 2021-03-13 MED ORDER — OXYCODONE HCL 5 MG PO TABS
10.0000 mg | ORAL_TABLET | Freq: Once | ORAL | Status: AC
  Administered 2021-03-13: 10 mg via ORAL
  Filled 2021-03-13: qty 2

## 2021-03-13 MED ORDER — OXYCODONE HCL 5 MG PO TABS
5.0000 mg | ORAL_TABLET | Freq: Once | ORAL | Status: AC
  Administered 2021-03-13: 5 mg via ORAL
  Filled 2021-03-13: qty 1

## 2021-03-13 MED ORDER — ACETAMINOPHEN 500 MG PO TABS
1000.0000 mg | ORAL_TABLET | Freq: Once | ORAL | Status: AC
  Administered 2021-03-13: 1000 mg via ORAL
  Filled 2021-03-13: qty 2

## 2021-03-13 MED ORDER — LIDOCAINE 5 % EX PTCH: 1.0000 | MEDICATED_PATCH | Freq: Two times a day (BID) | CUTANEOUS | 0 refills | Status: DC

## 2021-03-13 NOTE — Discharge Instructions (Addendum)
Please pick up the prescription for Keflex antibiotic first thing in the morning, before your urology appointment, and start taking 4 times daily for the next 10 days.  Use Tylenol for pain and fevers.  Up to 1000 mg per dose, up to 4 times per day.  Do not take more than 4000 mg of Tylenol/acetaminophen within 24 hours..  Use 10 mg oxycodone, lidocaine patches and Tylenol for pain.  Please use lidocaine patches and your site of pain.  Apply 1 patch at a time, leave on for 12 hours, then remove for 12 hours.  12 hours on, 12 hours off.  Do not apply more than 1 patch at a time.

## 2021-03-13 NOTE — Telephone Encounter (Addendum)
Called patient to inform per Dr. Apolinar Junes  to get STAT Renal ultrasound and follow up. Patient informed, ultrasound scheduled for same day, patient states will go to ER per OB. Denies any worsening pain or any other changes since this afternoon

## 2021-03-13 NOTE — ED Provider Notes (Addendum)
[facility] Emergency Department Provider Note ____________________________________________   Event Date/Time   First MD Initiated Contact with Patient 03/13/21 1919     (approximate)  I have reviewed the triage vital signs and the nursing notes.  HISTORY  Chief Complaint Post-op Problem   HPI Laura Sosa is a 35 y.o. femalewho presents to the ED for evaluation of   Chart review indicates patient is about [redacted] weeks gestation, G3, P2.  She recently had a left ureteral stent exchange due to obstructing ureteral stone on 7/11 by Dr. Apolinar Junes.  Originally had a stent placed on 7/3.   Patient presents to the ED from home for evaluation of continued and worsening left flank and hip pain, reminiscent of her previous episodes of ureterolithiasis.  She reports severe pain that is not controlled with her home oxycodone.  She reports "normal" urination without dysuria, hematuria or new incontinence.  Denies fevers, emesis or frontal abdominal pain.  She does report about 3 days of substernal chest pain and sensation of shortness of breath.  She reports pleuritic chest pains as well.  Denies any syncopal episodes or hemoptysis.  Denies any cough, productive cough.  Past Medical History:  Diagnosis Date   Bilateral kidney stones    Headache    History of kidney stones    Migraine    Pyelonephritis 04/17/2016    Patient Active Problem List   Diagnosis Date Noted   Hypocalcemia 03/05/2021   Anemia of pregnancy in second trimester 03/05/2021   Kidney stones 03/04/2021   Nausea and vomiting of pregnancy, antepartum 03/04/2021   E. 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   Infertility,  female 12/18/2017   History of pyelonephritis 12/18/2017   History of kidney stones 12/18/2017   Rubella non-immune status, antepartum 12/18/2017   FHx: cleft palate 12/18/2017    Past Surgical History:  Procedure Laterality Date   COLPOSCOPY     CYSTOSCOPY WITH STENT PLACEMENT Left 02/26/2021   Procedure: CYSTOSCOPY WITH STENT PLACEMENT;  Surgeon: Crista Elliot, MD;  Location: [facility] ORS;  Service: Urology;  Laterality: Left;   CYSTOSCOPY/RETROGRADE/URETEROSCOPY  03/15/2017   Procedure: CYSTOSCOPY/RETROGRADE/URETEROSCOPY;  Surgeon: Hildred Laser, MD;  Location: [facility] ORS;  Service: Urology;;   CYSTOSCOPY/URETEROSCOPY/HOLMIUM LASER/STENT PLACEMENT Left 03/06/2021   Procedure: CYSTOSCOPY/URETEROSCOPY/HOLMIUM LASER/STENT PLACEMENT;  Surgeon: Vanna Scotland, MD;  Location: [facility] ORS;  Service: Urology;  Laterality: Left;   LEEP      Prior to Admission medications   Medication Sig Start Date End Date Taking? Authorizing Provider  cephALEXin (KEFLEX) 500 MG capsule Take 1 capsule (500 mg total) by mouth 4 (four) times daily for 10 days. 03/13/21 03/23/21 Yes Delton Prairie, MD  lidocaine (LIDODERM) 5 % Place 1 patch onto the skin every 12 (twelve) hours. Remove & Discard patch within 12 hours or as directed by MD 03/13/21 03/13/22 Yes Delton Prairie, MD  acetaminophen (TYLENOL) 325 MG tablet Take 2 tablets (650 mg total) by mouth every 4 (four) hours as needed (for pain scale < 4  OR  temperature  >/=  100.5 F). 03/08/21   Hildred Laser, MD  docusate sodium (COLACE) 100 MG capsule Take 1 capsule (100 mg total) by mouth 2 (two) times daily as needed for mild constipation.  03/08/21   Hildred Laserherry, Anika, MD  ferrous sulfate 325 (65 FE) MG tablet Take 1 tablet (325 mg total) by mouth 2 (two) times daily with a meal. 03/08/21   Hildred Laserherry, Anika, MD  hydrOXYzine (ATARAX/VISTARIL) 25 MG tablet Take 1 tablet (25 mg total) by mouth 3 (three) times daily as needed for anxiety, nausea or vomiting (urinary spasm, sleep  difficulties). 03/08/21   Hildred Laserherry, Anika, MD  ondansetron (ZOFRAN-ODT) 4 MG disintegrating tablet Take 1 tablet (4 mg total) by mouth every 8 (eight) hours as needed for nausea or vomiting. 03/08/21   Hildred Laserherry, Anika, MD  oxyCODONE (OXY IR/ROXICODONE) 5 MG immediate release tablet Take 1 tablet (5 mg total) by mouth every 3 (three) hours as needed for moderate pain or severe pain. 03/02/21   Linzie CollinEvans, David James, MD  Prenatal Vit-Fe Fumarate-FA (MULTIVITAMIN-PRENATAL) 27-0.8 MG TABS tablet Take 1 tablet by mouth daily at 12 noon.    [provider]    Allergies Patient has no known allergies.  Family History  Problem Relation Age of Onset   Hypertension Mother    Diabetes Mother    Diabetes Father    Hypertension Father    Emphysema Maternal Grandmother    Lung cancer Maternal Grandmother    Asthma Daughter    Hypertension Paternal Grandmother    Heart defect Half-Sister        "boot shaped heart"   Kidney cancer Neg Hx    Bladder Cancer Neg Hx    Breast cancer Neg Hx    Ovarian cancer Neg Hx    Colon cancer Neg Hx     Social History Social History   Tobacco Use   Smoking status: Never   Smokeless tobacco: Never  Vaping Use   Vaping Use: Never used  Substance Use Topics   Alcohol use: No   Drug use: No    Review of Systems  Constitutional: No fever/chills Eyes: No visual changes. ENT: No sore throat. Cardiovascular: Positive for chest pain Respiratory: Positive for shortness of breath and pleurisy Gastrointestinal: No abdominal pain.  No nausea, no vomiting.  No diarrhea.  No constipation. Positive for left-sided flank pain, atraumatic Genitourinary: Negative for dysuria. Musculoskeletal: Negative for back pain. Skin: Negative for rash. Neurological: Negative for headaches, focal weakness or numbness.  ____________________________________________   PHYSICAL EXAM:  VITAL SIGNS: Vitals:   03/13/21 1923 03/13/21 2330  BP: 120/71 125/86  Pulse: 100 92   Resp: 20 (!) 28  Temp: 98.5 F (36.9 C)   SpO2: 100% 99%     Constitutional: Alert and oriented.  Appears uncomfortable, but in no acute distress. Eyes: Conjunctivae are normal. PERRL. EOMI. Head: Atraumatic. Nose: No congestion/rhinnorhea. Mouth/Throat: Mucous membranes are moist.  Oropharynx non-erythematous. Neck: No stridor. No cervical spine tenderness to palpation. Cardiovascular: Normal rate, regular rhythm. Grossly normal heart sounds.  Good peripheral circulation. Respiratory: Normal respiratory effort.  No retractions. Lungs CTAB. Gastrointestinal: Soft , nondistended, nontender to palpation. Benign frontal abdomen. Poorly localizing left flank tenderness with guarding Left-sided CVA tenderness is present, none on the right. Musculoskeletal: No lower extremity tenderness nor edema.  No joint effusions. No signs of acute trauma. Neurologic:  Normal speech and language. No gross focal neurologic deficits are appreciated. No gait instability noted. Skin:  Skin is warm, dry and intact. No rash noted. Psychiatric: Mood and affect are normal. Speech and behavior are normal. ____________________________________________   LABS (all labs ordered are listed, but only abnormal results are displayed)  Labs Reviewed  URINALYSIS, COMPLETE (  UACMP) WITH MICROSCOPIC - Abnormal; Notable for the following components:      Result Value   Color, Urine YELLOW (*)    APPearance CLOUDY (*)    Hgb urine dipstick MODERATE (*)    Protein, ur 100 (*)    Leukocytes,Ua LARGE (*)    WBC, UA >50 (*)    Bacteria, UA FEW (*)    All other components within normal limits  CBC WITH DIFFERENTIAL/PLATELET - Abnormal; Notable for the following components:   WBC 15.2 (*)    RBC 3.02 (*)    Hemoglobin 9.1 (*)    HCT 28.1 (*)    Platelets 470 (*)    Neutro Abs 12.5 (*)    Abs Immature Granulocytes 0.30 (*)    All other components within normal limits  BASIC METABOLIC PANEL - Abnormal; Notable for the  following components:   Potassium 3.2 (*)    Calcium 8.1 (*)    All other components within normal limits  D-DIMER, QUANTITATIVE - Abnormal; Notable for the following components:   D-Dimer, Quant 2.10 (*)    All other components within normal limits  RESP PANEL BY RT-PCR (FLU A&B, COVID) ARPGX2  URINE CULTURE  TROPONIN I (HIGH SENSITIVITY)  TROPONIN I (HIGH SENSITIVITY)   ____________________________________________  12 Lead EKG  Sinus rhythm, rate of 84 bpm.  Normal axis and intervals.  No evidence of acute ischemia. ____________________________________________  RADIOLOGY  ED MD interpretation:   Official radiology report(s): CT Angio Chest PE W and/or Wo Contrast  Result Date: 03/13/2021 CLINICAL DATA:  Left hip pain pregnant patient EXAM: CT ANGIOGRAPHY CHEST WITH CONTRAST TECHNIQUE: Multidetector CT imaging of the chest was performed using the standard protocol during bolus administration of intravenous contrast. Multiplanar CT image reconstructions and MIPs were obtained to evaluate the vascular anatomy. CONTRAST:  25mL OMNIPAQUE IOHEXOL 350 MG/ML SOLN COMPARISON:  None. FINDINGS: Cardiovascular: Satisfactory opacification of the pulmonary arteries to the segmental level. No evidence of pulmonary embolism. Nonaneurysmal aorta. Mild cardiomegaly. Small pericardial effusion. Mediastinum/Nodes: Midline trachea. No thyroid mass. No suspicious adenopathy. Esophagus within normal limits Lungs/Pleura: Small left-sided pleural effusion. Partial consolidation in the left lower lobe. Upper Abdomen: No acute abnormality. Musculoskeletal: No chest wall abnormality. No acute or significant osseous findings. Review of the MIP images confirms the above findings. IMPRESSION: 1. Negative for acute pulmonary embolus. 2. Mild cardiomegaly. Small left-sided pleural effusion. Partial consolidation in the left lower lobe which may reflect atelectasis or pneumonia Electronically Signed   By: Jasmine Pang  M.D.   On: 03/13/2021 23:12   US Renal  Addendum Date: 03/13/2021   ADDENDUM REPORT: 03/13/2021 23:18 ADDENDUM: These results were called by telephone at the time of interpretation on 03/13/2021 at 11:18 pm to provider [facility] , who verbally acknowledged these results. Electronically Signed   By: Tish Frederickson M.D.   On: 03/13/2021 23:18   Result Date: 03/13/2021 CLINICAL DATA:  Left ureterolithiasis with continued pain, recent stenting. Evaluate hydronephrosis and stones. EXAM: RENAL / URINARY TRACT ULTRASOUND COMPLETE COMPARISON:  Low sodium ureteral 03/04/2021, CT renal 03/19/2017 FINDINGS: Right Kidney: Renal measurements: 12.7 x 4.9 x 5.8 cm = volume: 185 mL. Echogenicity within normal limits. No mass. Mild hydronephrosis visualized. Left Kidney: Renal measurements: 11.8 x 7.6 x 6.3 cm = volume: 294 mL. Echogenicity within normal limits. Shadowing 8 mm renal calculus noted within the lower pole. No hydronephrosis visualized. Interval development of a couple of heterogeneous lesions within the superior pole of left kidney measuring: 3.5  x 3.1 x 3.6 cm and 4.2 x 3.9 x 3.8 cm. The inferior-most lesion is noted to have internal flow on color Doppler. Bladder: Appears normal for degree of bladder distention. Other: Small left pleural effusion. IMPRESSION: 1. Interval development of a couple of heterogeneous left renal lesions measuring up to 4.2 cm that could represent blood in a dilated calyx versus abscess versus hematoma versus urinoma formation. Recommend further evaluation with CT with intravenous contrast. 2. Nonobstructive 8 mm left inferior pole nephrolithiasis. Electronically Signed: By: Tish Frederickson M.D. On: 03/13/2021 23:12    ____________________________________________   PROCEDURES and INTERVENTIONS  Procedure(s) performed (including Critical Care):  Procedures  Medications  cefTRIAXone (ROCEPHIN) 1 g in sodium chloride 0.9 % 100 mL IVPB (1 g Intravenous New Bag/Given 03/13/21  2324)  lidocaine (LIDODERM) 5 % 1 patch (has no administration in time range)  oxyCODONE (Oxy IR/ROXICODONE) immediate release tablet 5 mg (has no administration in time range)  HYDROmorphone (DILAUDID) injection 1 mg (1 mg Intravenous Given 03/13/21 2144)  iohexol (OMNIPAQUE) 350 MG/ML injection 75 mL (75 mLs Intravenous Contrast Given 03/13/21 2257)  acetaminophen (TYLENOL) tablet 1,000 mg (1,000 mg Oral Given 03/13/21 2339)  oxyCODONE (Oxy IR/ROXICODONE) immediate release tablet 10 mg (10 mg Oral Given 03/13/21 2339)    ____________________________________________   MDM / ED COURSE   35 year old female at about [redacted] weeks gestation presents to the ED with continued left flank pain, with evidence of lower urinary tract infection associated with recent stent removal, amenable to trial of outpatient management with close urology and OB/GYN follow-up.  No evidence of fevers, sepsis.  Pain is controlled with single dose of IV Dilaudid, transition to p.o. analgesia.  Blood work with leukocytosis, slightly improved from previous readings last week.  Urinalysis does have infectious features and was sent for culture.  Of note her previous UAs of the past few weeks have been negative.  Started on antibiotics.  Renal ultrasound as above and without hydronephrosis.  CTA chest obtained and demonstrates no evidence of acute PE in the setting for chest pain or shortness of breath.  At this point I see no barriers to outpatient management after discussing the case is with urology and OB/GYN.  Will discharge with additional multimodal analgesia and antibiotics to follow-up closely with urology.  Clinical Course as of 04/07/21 1836  Mon Mar 13, 2021  2326 I talk with Dr. Richardo Hanks, he agrees with abx. He can see in clinic tomorrow or the following day.  [DS]  2330 Update pt of this.  She reports that she was told by her OB/GYN not to leave today.  I therefore paged Dr. Valentino Saxon [DS]  2342 I discussed the case with Dr.  Valentino Saxon, who is uncertain why patient was told that she could not leave.  From her perspective, there is no indication for admission to labor and delivery or any evidence of acute obstructive pathology, which I agree with. [DS]  2343 Reassessed the patient.  We discussed achieving adequate analgesia and outpatient management with Keflex antibiotics to follow-up with urology tomorrow.  She is in agreement. [DS]    Clinical Course User Index [DS] Delton Prairie, MD    ____________________________________________   FINAL CLINICAL IMPRESSION(S) / ED DIAGNOSES  Final diagnoses:  Acute cystitis during pregnancy in second trimester  Post-operative pain     ED Discharge Orders          Ordered    cephALEXin (KEFLEX) 500 MG capsule  4 times daily  03/13/21 2340    lidocaine (LIDODERM) 5 %  Every 12 hours        03/13/21 2341             Cora Brierley   Note:  This document was prepared using Dragon voice recognition software and may include unintentional dictation errors.    Delton Prairie, MD 03/13/21 2345    Delton Prairie, MD 04/07/21 [phone]

## 2021-03-13 NOTE — ED Triage Notes (Addendum)
Pt comes with c/o hip pain that is numb. Pt states she is [redacted] weeks pregnant. Pt denies any pregnancy complaints  Pt states she had kidney stone and stent placed March 06, 2021. Pt states since she is having the left hip pain and feels she can't walk straight up.   Pt refusing to have labs drawn in triage. Pt wishes to have this done once IV is established due to hard stick.

## 2021-03-13 NOTE — ED Notes (Signed)
Fetal Heart Tones 150. LL abd

## 2021-03-13 NOTE — Telephone Encounter (Addendum)
Called patient to discuss symptoms-patient has ongoing flank pain. Denies dysuria, fevers, body aches, nausea, or vomiting. Has been taking oxycodone as needed. Patient states "she is running low and does not want to run out." Denies difficulty urinating or any abdominal pain. Has been able to stay hydrated and eat without difficulty. Cannot get comfortable laying down.

## 2021-03-14 ENCOUNTER — Encounter: Payer: Self-pay | Admitting: Physician Assistant

## 2021-03-14 ENCOUNTER — Ambulatory Visit (INDEPENDENT_AMBULATORY_CARE_PROVIDER_SITE_OTHER): Payer: Medicaid Other | Admitting: Physician Assistant

## 2021-03-14 VITALS — BP 113/74 | HR 106 | Temp 97.7°F | Ht 63.0 in | Wt 165.0 lb

## 2021-03-14 DIAGNOSIS — N151 Renal and perinephric abscess: Secondary | ICD-10-CM

## 2021-03-14 LAB — TROPONIN I (HIGH SENSITIVITY): Troponin I (High Sensitivity): 6 ng/L (ref <18)

## 2021-03-14 MED ORDER — CEFPODOXIME PROXETIL 200 MG PO TABS: 200.0000 mg | ORAL_TABLET | Freq: Two times a day (BID) | ORAL | 0 refills | Status: DC

## 2021-03-14 NOTE — Patient Instructions (Addendum)
Stop Keflex, pick up cefpodoxime liquid from [facility]

## 2021-03-14 NOTE — Progress Notes (Signed)
03/14/2021 2:15 PM   Laura R Mennenga May 30, 1986 [id number]  CC: Chief Complaint  Patient presents with   Nephrolithiasis    HPI: Laura Sosa is a 35 y.o. pregnant female at [redacted]w[redacted]d who recently underwent staged left ureteroscopy with laser lithotripsy and stent exchange with Dr. Apolinar Junes for management of a 7 mm left renal stone associated with urinary infection who presents today for evaluation of ongoing left flank pain.  I removed her stent on POD 2, 6 days ago.   Today she reports ongoing, severe left flank pain associated with difficulty ambulating and shortness of breath.  She was seen in the emergency department last night and work-up was reassuring for PE.  She underwent renal ultrasound, which revealed physiologic right hydronephrosis of pregnancy with no evidence of left hydronephrosis.  There was also interval development of stone debris versus hematoma versus abscess within the left kidney.  She received 1 dose of IV Rocephin and was discharged with Keflex.  UA in the emergency department was grossly infected with >50 WBCs/hpf, 11-20 RBCs/hpf, and WBC clumps; urine culture pending.  She was found to have downtrending leukocytosis of 15.2.  Notably, patient has had a history of hyperemesis gravidarum with her current pregnancy and notes she has been unable to tolerate p.o. antibiotics.  She states she vomited both doses of Keflex she attempted to take today.  PMH: Past Medical History:  Diagnosis Date   Bilateral kidney stones    Headache    History of kidney stones    Migraine    Pyelonephritis 04/17/2016    Surgical History: Past Surgical History:  Procedure Laterality Date   COLPOSCOPY     CYSTOSCOPY WITH STENT PLACEMENT Left 02/26/2021   Procedure: CYSTOSCOPY WITH STENT PLACEMENT;  Surgeon: Crista Elliot, MD;  Location: [facility] ORS;  Service: Urology;  Laterality: Left;   CYSTOSCOPY/RETROGRADE/URETEROSCOPY  03/15/2017   Procedure:  CYSTOSCOPY/RETROGRADE/URETEROSCOPY;  Surgeon: Hildred Laser, MD;  Location: [facility] ORS;  Service: Urology;;   CYSTOSCOPY/URETEROSCOPY/HOLMIUM LASER/STENT PLACEMENT Left 03/06/2021   Procedure: CYSTOSCOPY/URETEROSCOPY/HOLMIUM LASER/STENT PLACEMENT;  Surgeon: Vanna Scotland, MD;  Location: [facility] ORS;  Service: Urology;  Laterality: Left;   LEEP      Home Medications:  Allergies as of 03/14/2021   No Known Allergies      Medication List        Accurate as of March 14, 2021  2:15 PM. If you have any questions, ask your nurse or doctor.          STOP taking these medications    cephALEXin 500 MG capsule Commonly known as: KEFLEX Stopped by: Carman Ching, PA-C       TAKE these medications    acetaminophen 325 MG tablet Commonly known as: TYLENOL Take 2 tablets (650 mg total) by mouth every 4 (four) hours as needed (for pain scale < 4  OR  temperature  >/=  100.5 F).   docusate sodium 100 MG capsule Commonly known as: COLACE Take 1 capsule (100 mg total) by mouth 2 (two) times daily as needed for mild constipation.   ferrous sulfate 325 (65 FE) MG tablet Take 1 tablet (325 mg total) by mouth 2 (two) times daily with a meal.   hydrOXYzine 25 MG tablet Commonly known as: ATARAX/VISTARIL Take 1 tablet (25 mg total) by mouth 3 (three) times daily as needed for anxiety, nausea or vomiting (urinary spasm, sleep difficulties).   lidocaine 5 % Commonly known as: Lidoderm Place 1 patch onto the skin every  12 (twelve) hours. Remove & Discard patch within 12 hours or as directed by MD   multivitamin-prenatal 27-0.8 MG Tabs tablet Take 1 tablet by mouth daily at 12 noon.   ondansetron 4 MG disintegrating tablet Commonly known as: ZOFRAN-ODT Take 1 tablet (4 mg total) by mouth every 8 (eight) hours as needed for nausea or vomiting.   oxyCODONE 5 MG immediate release tablet Commonly known as: Oxy IR/ROXICODONE Take 1 tablet (5 mg total) by mouth every 3 (three) hours  as needed for moderate pain or severe pain.        Allergies:  No Known Allergies  Family History: Family History  Problem Relation Age of Onset   Hypertension Mother    Diabetes Mother    Diabetes Father    Hypertension Father    Emphysema Maternal Grandmother    Lung cancer Maternal Grandmother    Asthma Daughter    Hypertension Paternal Grandmother    Heart defect Half-Sister        "boot shaped heart"   Kidney cancer Neg Hx    Bladder Cancer Neg Hx    Breast cancer Neg Hx    Ovarian cancer Neg Hx    Colon cancer Neg Hx     Social History:   reports that she has never smoked. She has never used smokeless tobacco. She reports that she does not drink alcohol and does not use drugs.  Physical Exam: BP 113/74   Pulse (!) 106   Temp 97.7 F (36.5 C) (Oral)   Ht 5\' 3"  (1.6 m)   Wt 165 lb (74.8 kg)   LMP  (LMP Unknown)   BMI 29.23 kg/m   Constitutional:  Alert and oriented, uncomfortable appearing, nontoxic appearing HEENT: [place], AT Cardiovascular: No clubbing, cyanosis, or edema Respiratory: Normal respiratory effort, no increased work of breathing Skin: No rashes, bruises or suspicious lesions Neurologic: Grossly intact, no focal deficits, moving all 4 extremities Psychiatric: Normal mood and affect  Pertinent Imaging: Results for orders placed during the hospital encounter of 03/13/21  03/15/21 Renal  Addendum 03/13/2021 11:20 PM ADDENDUM REPORT: 03/13/2021 23:18  ADDENDUM: These results were called by telephone at the time of interpretation on 03/13/2021 at 11:18 pm to provider [facility] , who verbally acknowledged these results.   Electronically Signed By: [facility] M.D. On: 03/13/2021 23:18  Narrative CLINICAL DATA:  Left ureterolithiasis with continued pain, recent stenting. Evaluate hydronephrosis and stones.  EXAM: RENAL / URINARY TRACT ULTRASOUND COMPLETE  COMPARISON:  Low sodium ureteral 03/04/2021, CT renal  03/19/2017  FINDINGS: Right Kidney:  Renal measurements: 12.7 x 4.9 x 5.8 cm = volume: 185 mL. Echogenicity within normal limits. No mass. Mild hydronephrosis visualized.  Left Kidney:  Renal measurements: 11.8 x 7.6 x 6.3 cm = volume: 294 mL. Echogenicity within normal limits. Shadowing 8 mm renal calculus noted within the lower pole. No hydronephrosis visualized. Interval development of a couple of heterogeneous lesions within the superior pole of left kidney measuring: 3.5 x 3.1 x 3.6 cm and 4.2 x 3.9 x 3.8 cm. The inferior-most lesion is noted to have internal flow on color Doppler.  Bladder:  Appears normal for degree of bladder distention.  Other:  Small left pleural effusion.  IMPRESSION: 1. Interval development of a couple of heterogeneous left renal lesions measuring up to 4.2 cm that could represent blood in a dilated calyx versus abscess versus hematoma versus urinoma formation. Recommend further evaluation with CT with intravenous contrast. 2. Nonobstructive 8 mm left  inferior pole nephrolithiasis.  Electronically Signed: By: Tish Frederickson M.D. On: 03/13/2021 23:12  I personally reviewed the images referenced above and note interval development of a possible left renal abscess.  Assessment & Plan:   1. Abscess, renal/perirenal Persistent, though downtrending, leukocytosis, left flank pain out of proportion with exam, and interval development of possible renal abscess on renal ultrasound.  I reviewed her imaging with Dr. Apolinar Junes and we are planning to treat her for a renal abscess.  Given her inability to tolerate Keflex, I was able to get cefpodoxime 200 mg twice daily x14 days compounded at [facility] in [place] and gave verbal orders for them to produce this.  Counseled patient to start the antibiotic this afternoon.  We will plan for stat CBC and renal ultrasound in 3 days.  May consider IR involvement at that time for possible  percutaneous drainage depending on size and organization of renal abscess. - US RENAL; Future - CBC; Future  Return for will call with results.  Carman Ching, PA-C  [facility] [address], [address] [place], [place] [address] [phone]

## 2021-03-14 NOTE — ED Notes (Signed)
Pt alert and oriented x 4, verbalized understanding of discharge instructions. No questions at this time

## 2021-03-15 ENCOUNTER — Ambulatory Visit: Payer: Self-pay

## 2021-03-15 ENCOUNTER — Encounter: Payer: Self-pay | Admitting: Urology

## 2021-03-15 ENCOUNTER — Telehealth: Payer: Self-pay

## 2021-03-15 DIAGNOSIS — Z20822 Contact with and (suspected) exposure to covid-19: Secondary | ICD-10-CM | POA: Diagnosis not present

## 2021-03-15 DIAGNOSIS — Z28310 Unvaccinated for covid-19: Secondary | ICD-10-CM | POA: Diagnosis not present

## 2021-03-15 DIAGNOSIS — O99891 Other specified diseases and conditions complicating pregnancy: Secondary | ICD-10-CM | POA: Diagnosis not present

## 2021-03-15 DIAGNOSIS — R109 Unspecified abdominal pain: Secondary | ICD-10-CM | POA: Diagnosis not present

## 2021-03-15 DIAGNOSIS — R93422 Abnormal radiologic findings on diagnostic imaging of left kidney: Secondary | ICD-10-CM | POA: Diagnosis not present

## 2021-03-15 DIAGNOSIS — O368120 Decreased fetal movements, second trimester, not applicable or unspecified: Secondary | ICD-10-CM | POA: Diagnosis not present

## 2021-03-15 DIAGNOSIS — O2302 Infections of kidney in pregnancy, second trimester: Secondary | ICD-10-CM | POA: Diagnosis not present

## 2021-03-15 DIAGNOSIS — Z3A25 25 weeks gestation of pregnancy: Secondary | ICD-10-CM | POA: Diagnosis not present

## 2021-03-15 DIAGNOSIS — O2342 Unspecified infection of urinary tract in pregnancy, second trimester: Secondary | ICD-10-CM | POA: Diagnosis not present

## 2021-03-15 LAB — URINE CULTURE: Culture: >=100000 — AB

## 2021-03-15 NOTE — Telephone Encounter (Signed)
Incoming call on triage line from patients mother in regards to patient being very sick over night on new ABX she was prescribed yesterday in clinic. She states patient has been throwing up throughout the night as well as severe diarrhea. Mother is concerned about patient potentially being dehydrated and needing fluids. After speaking with Maralyn Sago, advised patients mother she may take up to 2 Zofran at once, sip on Gatorade and try to eat some crackers. Advised her to go to ER if she continues to vomit and have diarrhea. Will check back in with patient after lunch to see if she has been able to tolerate the ABX.

## 2021-03-15 NOTE — Telephone Encounter (Signed)
Update: Incoming VM on triage line from patients mother who states she is taking patient to [facility] for care and will not be coming to the 3:30 appt today.

## 2021-03-15 NOTE — Telephone Encounter (Signed)
Update: called to check in with patient after lunch to see if the Zofran was working and if the patient was able to take another dose of ABX. Patients mother reports the vomiting had stopped however the diarrhea has been on going all day. Offered patient nurse visit for Rocephin injection since she is not tolerating the ABX, patient accepted 3:30 appt.

## 2021-03-16 DIAGNOSIS — O368120 Decreased fetal movements, second trimester, not applicable or unspecified: Secondary | ICD-10-CM | POA: Diagnosis not present

## 2021-03-16 DIAGNOSIS — N390 Urinary tract infection, site not specified: Secondary | ICD-10-CM | POA: Diagnosis not present

## 2021-03-16 DIAGNOSIS — Z3A25 25 weeks gestation of pregnancy: Secondary | ICD-10-CM | POA: Diagnosis not present

## 2021-03-16 DIAGNOSIS — O99891 Other specified diseases and conditions complicating pregnancy: Secondary | ICD-10-CM | POA: Diagnosis not present

## 2021-03-16 DIAGNOSIS — R109 Unspecified abdominal pain: Secondary | ICD-10-CM | POA: Diagnosis not present

## 2021-03-17 DIAGNOSIS — O2342 Unspecified infection of urinary tract in pregnancy, second trimester: Secondary | ICD-10-CM | POA: Diagnosis not present

## 2021-03-17 DIAGNOSIS — O99891 Other specified diseases and conditions complicating pregnancy: Secondary | ICD-10-CM | POA: Diagnosis not present

## 2021-03-17 DIAGNOSIS — O368120 Decreased fetal movements, second trimester, not applicable or unspecified: Secondary | ICD-10-CM | POA: Diagnosis not present

## 2021-03-17 DIAGNOSIS — O219 Vomiting of pregnancy, unspecified: Secondary | ICD-10-CM | POA: Diagnosis not present

## 2021-03-17 DIAGNOSIS — Z3A25 25 weeks gestation of pregnancy: Secondary | ICD-10-CM | POA: Diagnosis not present

## 2021-03-17 DIAGNOSIS — R109 Unspecified abdominal pain: Secondary | ICD-10-CM | POA: Diagnosis not present

## 2021-03-18 DIAGNOSIS — O219 Vomiting of pregnancy, unspecified: Secondary | ICD-10-CM | POA: Diagnosis not present

## 2021-03-18 DIAGNOSIS — O2302 Infections of kidney in pregnancy, second trimester: Secondary | ICD-10-CM | POA: Diagnosis not present

## 2021-03-18 DIAGNOSIS — N390 Urinary tract infection, site not specified: Secondary | ICD-10-CM | POA: Diagnosis not present

## 2021-03-18 DIAGNOSIS — Z3A25 25 weeks gestation of pregnancy: Secondary | ICD-10-CM | POA: Diagnosis not present

## 2021-03-19 DIAGNOSIS — O2342 Unspecified infection of urinary tract in pregnancy, second trimester: Secondary | ICD-10-CM | POA: Diagnosis not present

## 2021-03-19 DIAGNOSIS — O368120 Decreased fetal movements, second trimester, not applicable or unspecified: Secondary | ICD-10-CM | POA: Diagnosis not present

## 2021-03-19 DIAGNOSIS — O99891 Other specified diseases and conditions complicating pregnancy: Secondary | ICD-10-CM | POA: Diagnosis not present

## 2021-03-19 DIAGNOSIS — O219 Vomiting of pregnancy, unspecified: Secondary | ICD-10-CM | POA: Diagnosis not present

## 2021-03-19 DIAGNOSIS — Z3A25 25 weeks gestation of pregnancy: Secondary | ICD-10-CM | POA: Diagnosis not present

## 2021-03-19 DIAGNOSIS — R109 Unspecified abdominal pain: Secondary | ICD-10-CM | POA: Diagnosis not present

## 2021-03-20 ENCOUNTER — Telehealth: Payer: Self-pay | Admitting: Physician Assistant

## 2021-03-20 DIAGNOSIS — Z3A25 25 weeks gestation of pregnancy: Secondary | ICD-10-CM | POA: Diagnosis not present

## 2021-03-20 DIAGNOSIS — N390 Urinary tract infection, site not specified: Secondary | ICD-10-CM | POA: Diagnosis not present

## 2021-03-20 DIAGNOSIS — O219 Vomiting of pregnancy, unspecified: Secondary | ICD-10-CM | POA: Diagnosis not present

## 2021-03-20 DIAGNOSIS — O2302 Infections of kidney in pregnancy, second trimester: Secondary | ICD-10-CM | POA: Diagnosis not present

## 2021-03-20 NOTE — Telephone Encounter (Signed)
We received a call from Laura Rhine, MD, OBGYN resident from [facility] with an update on her hospitalization course at their facility. [facility] repeated a renal US upon admission which again demonstrated possible left renal hematoma vs abscess. Urology was consulted, who felt it was likely a hematoma and patient has been afebrile throughout her admission. Regardless, she was treated for complicated UTI with IV Rocephin daily with plans to transition her to Augmentin syrup, however unfortunately she failed this and was transitioned back to IV. She is scheduled for one more dose of IV Rocephin today with plans for d/c this afternoon, which would total 5 doses while admitted. [facility] would like to arrange outpatient IM Rocephin in our clinic given her intolerance of PO antibiotics. We discussed that our clinic is open M-F so I can schedule her for daily injections starting tomorrow and lasting through Friday for a total of 4 days of IM Rocephin, which would be a total of 9 days of therapy. Dr. Daivd Sosa feels this course would be of appropriate duration.  Please schedule her for daily IM Rocephin injections in our clinic this week starting tomorrow morning and lasting through Friday. Please contact patient this afternoon with appointment information. Dr. Daivd Sosa to make patient aware of our impending call.

## 2021-03-20 NOTE — Telephone Encounter (Signed)
Patient aware of 4 rocephin injection appts.

## 2021-03-21 ENCOUNTER — Encounter: Payer: Self-pay | Admitting: Physician Assistant

## 2021-03-21 ENCOUNTER — Other Ambulatory Visit: Payer: Self-pay

## 2021-03-21 ENCOUNTER — Ambulatory Visit (INDEPENDENT_AMBULATORY_CARE_PROVIDER_SITE_OTHER): Payer: Medicaid Other | Admitting: Physician Assistant

## 2021-03-21 VITALS — BP 99/68 | HR 105 | Ht 63.0 in | Wt 157.0 lb

## 2021-03-21 DIAGNOSIS — R109 Unspecified abdominal pain: Secondary | ICD-10-CM

## 2021-03-21 DIAGNOSIS — N151 Renal and perinephric abscess: Secondary | ICD-10-CM

## 2021-03-21 MED ORDER — CEFTRIAXONE SODIUM 1 G IJ SOLR
1.0000 g | Freq: Once | INTRAMUSCULAR | Status: AC
  Administered 2021-03-21: 1 g via INTRAMUSCULAR

## 2021-03-21 NOTE — Progress Notes (Signed)
03/21/2021 11:48 AM   Laura Sosa 1986-03-20 [id number]  CC: Chief Complaint  Patient presents with   Other    HPI: Laura Sosa is a 35 y.o. pregnant female at [redacted]w[redacted]d with a recent history of staged left ureteroscopy with laser lithotripsy and stent exchange with Dr. Apolinar Junes for management of a 7 mm left renal stone associated with urinary infection who developed a left renal hematoma versus abscess postoperatively.  Treatment has been complicated by hyperemesis, secondary to which she has been unable to tolerate p.o. antibiotics.  She was recently hospitalized at [facility] for this and received 5 days of IV ceftriaxone while admitted.  She was discharged yesterday after receiving 1 dose of antibiotics in the morning.    Per my previous phone note, she presents to clinic today for IM Rocephin.  She is scheduled to receive a dose daily for 4 days this week to complete a total of 9 days of empiric antibiotics.  Renal ultrasound on admission at [facility] redemonstrated 2 complex avascular hypoechoic collections within the left kidney measuring up to 4.8 cm which are felt to be more consistent with hematomas than abscess.  Renal imaging was not repeated during admission.  She is afebrile today and reports feeling slightly better, however she has had persistent left flank pain managed well on Dilaudid.  She reports her blood pressures have been running soft since her recent admission.  PMH: Past Medical History:  Diagnosis Date   Bilateral kidney stones    Headache    History of kidney stones    Migraine    Pyelonephritis 04/17/2016    Surgical History: Past Surgical History:  Procedure Laterality Date   COLPOSCOPY     CYSTOSCOPY WITH STENT PLACEMENT Left 02/26/2021   Procedure: CYSTOSCOPY WITH STENT PLACEMENT;  Surgeon: Crista Elliot, MD;  Location: [facility] ORS;  Service: Urology;  Laterality: Left;   CYSTOSCOPY/RETROGRADE/URETEROSCOPY  03/15/2017   Procedure:  CYSTOSCOPY/RETROGRADE/URETEROSCOPY;  Surgeon: Hildred Laser, MD;  Location: [facility] ORS;  Service: Urology;;   CYSTOSCOPY/URETEROSCOPY/HOLMIUM LASER/STENT PLACEMENT Left 03/06/2021   Procedure: CYSTOSCOPY/URETEROSCOPY/HOLMIUM LASER/STENT PLACEMENT;  Surgeon: Vanna Scotland, MD;  Location: [facility] ORS;  Service: Urology;  Laterality: Left;   LEEP      Home Medications:  Allergies as of 03/21/2021   No Known Allergies      Medication List        Accurate as of March 21, 2021 11:48 AM. If you have any questions, ask your nurse or doctor.          acetaminophen 325 MG tablet Commonly known as: TYLENOL Take 2 tablets (650 mg total) by mouth every 4 (four) hours as needed (for pain scale < 4  OR  temperature  >/=  100.5 F).   docusate sodium 100 MG capsule Commonly known as: COLACE Take 1 capsule (100 mg total) by mouth 2 (two) times daily as needed for mild constipation.   ferrous sulfate 325 (65 FE) MG tablet Take 1 tablet (325 mg total) by mouth 2 (two) times daily with a meal.   hydrOXYzine 25 MG tablet Commonly known as: ATARAX/VISTARIL Take 1 tablet (25 mg total) by mouth 3 (three) times daily as needed for anxiety, nausea or vomiting (urinary spasm, sleep difficulties).   lidocaine 5 % Commonly known as: Lidoderm Place 1 patch onto the skin every 12 (twelve) hours. Remove & Discard patch within 12 hours or as directed by MD   multivitamin-prenatal 27-0.8 MG Tabs tablet Take 1 tablet by  mouth daily at 12 noon.   ondansetron 4 MG disintegrating tablet Commonly known as: ZOFRAN-ODT Take 1 tablet (4 mg total) by mouth every 8 (eight) hours as needed for nausea or vomiting.   oxyCODONE 5 MG immediate release tablet Commonly known as: Oxy IR/ROXICODONE Take 1 tablet (5 mg total) by mouth every 3 (three) hours as needed for moderate pain or severe pain.        Allergies:  No Known Allergies  Family History: Family History  Problem Relation Age of Onset    Hypertension Mother    Diabetes Mother    Diabetes Father    Hypertension Father    Emphysema Maternal Grandmother    Lung cancer Maternal Grandmother    Asthma Daughter    Hypertension Paternal Grandmother    Heart defect Half-Sister        "boot shaped heart"   Kidney cancer Neg Hx    Bladder Cancer Neg Hx    Breast cancer Neg Hx    Ovarian cancer Neg Hx    Colon cancer Neg Hx     Social History:   reports that she has never smoked. She has never used smokeless tobacco. She reports that she does not drink alcohol and does not use drugs.  Physical Exam: BP 99/68   Pulse (!) 105   Ht 5\' 3"  (1.6 m)   Wt 157 lb (71.2 kg)   LMP  (LMP Unknown)   BMI 27.81 kg/m   Constitutional:  Alert and oriented, no acute distress, nontoxic appearing HEENT: [place], AT Cardiovascular: No clubbing, cyanosis, or edema Respiratory: Normal respiratory effort, no increased work of breathing Skin: No rashes, bruises or suspicious lesions Neurologic: Grossly intact, no focal deficits, moving all 4 extremities Psychiatric: Normal mood and affect  Assessment & Plan:   1. Abscess, renal/perirenal 1 g IM ceftriaxone administered in clinic today.  We will see her back every day this week for total of 4 days of consecutive doses.  We will plan to repeat renal ultrasound late next week for monitoring of likely left renal abscess.  We discussed that if she develops fever or uncontrolled pain, she should return to the emergency department.  She expressed understanding. - cefTRIAXone (ROCEPHIN) injection 1 g - RENAL; Future   Return in 1 day (on 03/22/2021) for IM Rocephin.  03/24/2021, PA-C  [facility] [address] [place], [place] [phone]

## 2021-03-21 NOTE — Progress Notes (Signed)
IM Injection  Patient is present today for an IM Injection for treatment of renal abscess. Drug: Ceftriaxone Dose:1g Location:right ventrogluteal Lot: [id number] Exp:05/2023 Patient tolerated well, no complications were noted  Performed by: Franchot Erichsen, CMA  Additional notes/ Follow up: RTC tomorrow for another ceftriaxone.

## 2021-03-22 ENCOUNTER — Ambulatory Visit (INDEPENDENT_AMBULATORY_CARE_PROVIDER_SITE_OTHER): Payer: Medicaid Other | Admitting: Physician Assistant

## 2021-03-22 ENCOUNTER — Ambulatory Visit: Payer: Self-pay | Admitting: Physician Assistant

## 2021-03-22 DIAGNOSIS — N151 Renal and perinephric abscess: Secondary | ICD-10-CM

## 2021-03-22 MED ORDER — CEFTRIAXONE SODIUM 1 G IJ SOLR
1.0000 g | INTRAMUSCULAR | Status: DC
  Administered 2021-03-22: 1 g via INTRAMUSCULAR

## 2021-03-22 NOTE — Progress Notes (Signed)
IM Injection  Patient is present today for an IM Injection for treatment of renal abscess. Drug: Ceftriaxone Dose:1g Location: left ventrogluteal Lot: [id number] Exp:05/2023 Patient tolerated well, no complications were noted  Performed by: Franchot Erichsen, CMA  Additional notes/ Follow up: RTC tomorrow for next injection.

## 2021-03-23 ENCOUNTER — Ambulatory Visit (INDEPENDENT_AMBULATORY_CARE_PROVIDER_SITE_OTHER): Payer: Medicaid Other | Admitting: Obstetrics and Gynecology

## 2021-03-23 ENCOUNTER — Ambulatory Visit (INDEPENDENT_AMBULATORY_CARE_PROVIDER_SITE_OTHER): Payer: Medicaid Other | Admitting: Physician Assistant

## 2021-03-23 ENCOUNTER — Other Ambulatory Visit: Payer: Self-pay

## 2021-03-23 ENCOUNTER — Ambulatory Visit
Admission: RE | Admit: 2021-03-23 | Discharge: 2021-03-23 | Disposition: A | Discharge status: Home / Self Care | Payer: Medicaid Other | Source: Ambulatory Visit | Attending: Obstetrics and Gynecology | Admitting: Obstetrics and Gynecology

## 2021-03-23 ENCOUNTER — Encounter: Payer: Self-pay | Admitting: Obstetrics and Gynecology

## 2021-03-23 ENCOUNTER — Ambulatory Visit: Payer: Self-pay | Admitting: Physician Assistant

## 2021-03-23 VITALS — BP 86/64 | HR 111 | Wt 153.9 lb

## 2021-03-23 DIAGNOSIS — N151 Renal and perinephric abscess: Secondary | ICD-10-CM

## 2021-03-23 DIAGNOSIS — O0993 Supervision of high risk pregnancy, unspecified, third trimester: Secondary | ICD-10-CM

## 2021-03-23 DIAGNOSIS — O219 Vomiting of pregnancy, unspecified: Secondary | ICD-10-CM

## 2021-03-23 DIAGNOSIS — N200 Calculus of kidney: Secondary | ICD-10-CM

## 2021-03-23 DIAGNOSIS — O26832 Pregnancy related renal disease, second trimester: Secondary | ICD-10-CM

## 2021-03-23 DIAGNOSIS — R109 Unspecified abdominal pain: Secondary | ICD-10-CM

## 2021-03-23 DIAGNOSIS — Z3A25 25 weeks gestation of pregnancy: Secondary | ICD-10-CM

## 2021-03-23 DIAGNOSIS — R10A2 Flank pain, left side: Secondary | ICD-10-CM

## 2021-03-23 DIAGNOSIS — O99891 Other specified diseases and conditions complicating pregnancy: Secondary | ICD-10-CM

## 2021-03-23 DIAGNOSIS — O2612 Low weight gain in pregnancy, second trimester: Secondary | ICD-10-CM

## 2021-03-23 LAB — POCT URINALYSIS DIPSTICK OB
Glucose, UA: NEGATIVE
Ketones, UA: NEGATIVE
Nitrite, UA: NEGATIVE
Spec Grav, UA: 1.02 (ref 1.010–1.025)
Urobilinogen, UA: 0.2 E.U./dL
pH, UA: 6.5 (ref 5.0–8.0)

## 2021-03-23 MED ORDER — CYCLOBENZAPRINE HCL 10 MG PO TABS: 10.0000 mg | ORAL_TABLET | Freq: Three times a day (TID) | ORAL | 2 refills | Status: DC | PRN

## 2021-03-23 MED ORDER — CEFTRIAXONE SODIUM 1 G IJ SOLR
1.0000 g | INTRAMUSCULAR | Status: DC
  Administered 2021-03-23: 1 g via INTRAMUSCULAR

## 2021-03-23 NOTE — Progress Notes (Signed)
ROB: Patient noting continued complaints of left sided flank plain. Is currently getting daily IM injections of Rocephin with Urology (last dose is tomorrow).  Was seen at [facility] last week and was admitted for 5 days due to her hyperemesis and flank pain.  Notes that she is taking Dilaudid for pain which does help some but still notes a pulling and tightness in her back.  Offered trial of Flexeril to see if this would help, also encouraged use of heating pads.  Reports that she is close to have a follow-up for her anatomy scan today however the ultrasound was canceled after she waited an hour.  We will get rescheduled.  Patient also due to have renal ultrasound next week as f/u from recent urethral lithotripsy and stent placement.   Patient concerned about weight loss.  Has lost 23 lbs this pregnancy, majority of over the past 2 -3 weeks during recent hospitalizations.  Is currently only able to tolerate fruit and cereal (using whole milk).  Will work on getting nutritional supplements ordered (Boost or Ensure). Is scheduled for 4 week visit, for 28 week labs that time.

## 2021-03-23 NOTE — Progress Notes (Signed)
ROB: She continues to have pain from the kidney stone removal. She has no concerns about today's visit.

## 2021-03-23 NOTE — Telephone Encounter (Signed)
Appointment changed to 11:30am today.  Patient notified.

## 2021-03-23 NOTE — Progress Notes (Signed)
IM Injection  Patient is present today for an IM Injection for treatment of renal abscess. Drug: Ceftriaxone  Dose:1g Location:Right ventrogluteal Lot: [id number] Exp:05/2023 Patient tolerated well, no complications were noted  Performed by: Franchot Erichsen, CMA  Additional notes/ Follow up: RTC tomorrow for next injection.

## 2021-03-24 ENCOUNTER — Telehealth: Payer: Self-pay | Admitting: Physician Assistant

## 2021-03-24 ENCOUNTER — Inpatient Hospital Stay
Admission: AD | Admit: 2021-03-24 | Discharge: 2021-04-03 | DRG: 831 | Disposition: A | Discharge status: Home Health | Payer: Medicaid Other | Source: Ambulatory Visit | Attending: Hospitalist | Admitting: Hospitalist

## 2021-03-24 ENCOUNTER — Inpatient Hospital Stay: Payer: Medicaid Other

## 2021-03-24 ENCOUNTER — Ambulatory Visit: Payer: Self-pay | Admitting: Physician Assistant

## 2021-03-24 ENCOUNTER — Ambulatory Visit (INDEPENDENT_AMBULATORY_CARE_PROVIDER_SITE_OTHER): Payer: Medicaid Other | Admitting: Physician Assistant

## 2021-03-24 ENCOUNTER — Other Ambulatory Visit: Payer: Self-pay

## 2021-03-24 ENCOUNTER — Encounter: Payer: Self-pay | Admitting: Obstetrics and Gynecology

## 2021-03-24 DIAGNOSIS — O2302 Infections of kidney in pregnancy, second trimester: Principal | ICD-10-CM | POA: Diagnosis present

## 2021-03-24 DIAGNOSIS — N289 Disorder of kidney and ureter, unspecified: Secondary | ICD-10-CM | POA: Diagnosis not present

## 2021-03-24 DIAGNOSIS — Z3490 Encounter for supervision of normal pregnancy, unspecified, unspecified trimester: Secondary | ICD-10-CM

## 2021-03-24 DIAGNOSIS — N151 Renal and perinephric abscess: Secondary | ICD-10-CM

## 2021-03-24 DIAGNOSIS — R1110 Vomiting, unspecified: Secondary | ICD-10-CM | POA: Diagnosis not present

## 2021-03-24 DIAGNOSIS — Z3A25 25 weeks gestation of pregnancy: Secondary | ICD-10-CM | POA: Diagnosis not present

## 2021-03-24 DIAGNOSIS — Z9889 Other specified postprocedural states: Secondary | ICD-10-CM

## 2021-03-24 DIAGNOSIS — N390 Urinary tract infection, site not specified: Secondary | ICD-10-CM

## 2021-03-24 DIAGNOSIS — O99282 Endocrine, nutritional and metabolic diseases complicating pregnancy, second trimester: Secondary | ICD-10-CM | POA: Diagnosis present

## 2021-03-24 DIAGNOSIS — Z87448 Personal history of other diseases of urinary system: Secondary | ICD-10-CM | POA: Diagnosis present

## 2021-03-24 DIAGNOSIS — Z792 Long term (current) use of antibiotics: Secondary | ICD-10-CM

## 2021-03-24 DIAGNOSIS — M25512 Pain in left shoulder: Secondary | ICD-10-CM | POA: Diagnosis not present

## 2021-03-24 DIAGNOSIS — J90 Pleural effusion, not elsewhere classified: Secondary | ICD-10-CM

## 2021-03-24 DIAGNOSIS — N136 Pyonephrosis: Secondary | ICD-10-CM | POA: Diagnosis present

## 2021-03-24 DIAGNOSIS — Z79899 Other long term (current) drug therapy: Secondary | ICD-10-CM

## 2021-03-24 DIAGNOSIS — F419 Anxiety disorder, unspecified: Secondary | ICD-10-CM | POA: Diagnosis present

## 2021-03-24 DIAGNOSIS — O99012 Anemia complicating pregnancy, second trimester: Secondary | ICD-10-CM | POA: Diagnosis present

## 2021-03-24 DIAGNOSIS — O99342 Other mental disorders complicating pregnancy, second trimester: Secondary | ICD-10-CM | POA: Diagnosis present

## 2021-03-24 DIAGNOSIS — O211 Hyperemesis gravidarum with metabolic disturbance: Secondary | ICD-10-CM | POA: Diagnosis present

## 2021-03-24 DIAGNOSIS — M62830 Muscle spasm of back: Secondary | ICD-10-CM | POA: Diagnosis not present

## 2021-03-24 DIAGNOSIS — O26832 Pregnancy related renal disease, second trimester: Secondary | ICD-10-CM | POA: Diagnosis not present

## 2021-03-24 DIAGNOSIS — A419 Sepsis, unspecified organism: Secondary | ICD-10-CM

## 2021-03-24 DIAGNOSIS — O99512 Diseases of the respiratory system complicating pregnancy, second trimester: Secondary | ICD-10-CM

## 2021-03-24 DIAGNOSIS — Z79891 Long term (current) use of opiate analgesic: Secondary | ICD-10-CM

## 2021-03-24 DIAGNOSIS — I509 Heart failure, unspecified: Secondary | ICD-10-CM | POA: Diagnosis present

## 2021-03-24 DIAGNOSIS — O210 Mild hyperemesis gravidarum: Secondary | ICD-10-CM | POA: Diagnosis present

## 2021-03-24 DIAGNOSIS — K769 Liver disease, unspecified: Secondary | ICD-10-CM | POA: Diagnosis not present

## 2021-03-24 DIAGNOSIS — Z28310 Unvaccinated for covid-19: Secondary | ICD-10-CM

## 2021-03-24 DIAGNOSIS — R0602 Shortness of breath: Secondary | ICD-10-CM

## 2021-03-24 DIAGNOSIS — G894 Chronic pain syndrome: Secondary | ICD-10-CM | POA: Diagnosis present

## 2021-03-24 DIAGNOSIS — O99612 Diseases of the digestive system complicating pregnancy, second trimester: Secondary | ICD-10-CM | POA: Diagnosis present

## 2021-03-24 DIAGNOSIS — B9620 Unspecified Escherichia coli [E. coli] as the cause of diseases classified elsewhere: Secondary | ICD-10-CM | POA: Diagnosis present

## 2021-03-24 DIAGNOSIS — Z3A27 27 weeks gestation of pregnancy: Secondary | ICD-10-CM

## 2021-03-24 DIAGNOSIS — K224 Dyskinesia of esophagus: Secondary | ICD-10-CM | POA: Diagnosis present

## 2021-03-24 DIAGNOSIS — I959 Hypotension, unspecified: Secondary | ICD-10-CM | POA: Diagnosis not present

## 2021-03-24 DIAGNOSIS — R0902 Hypoxemia: Secondary | ICD-10-CM | POA: Diagnosis not present

## 2021-03-24 DIAGNOSIS — Z87442 Personal history of urinary calculi: Secondary | ICD-10-CM | POA: Diagnosis not present

## 2021-03-24 DIAGNOSIS — Z20822 Contact with and (suspected) exposure to covid-19: Secondary | ICD-10-CM | POA: Diagnosis present

## 2021-03-24 LAB — MICROSCOPIC EXAMINATION: WBC, UA: >30 /hpf — AB (ref 0–5)

## 2021-03-24 LAB — URINALYSIS, COMPLETE
Bilirubin, UA: NEGATIVE
Glucose, UA: NEGATIVE
Nitrite, UA: NEGATIVE
Specific Gravity, UA: 1.02 (ref 1.005–1.030)
Urobilinogen, Ur: 2 mg/dL — ABNORMAL HIGH (ref 0.2–1.0)
pH, UA: 7 (ref 5.0–7.5)

## 2021-03-24 MED ORDER — PROMETHAZINE HCL 25 MG RE SUPP
12.5000 mg | Freq: Three times a day (TID) | RECTAL | Status: DC | PRN
  Administered 2021-03-24 – 2021-03-25 (×3): 12.5 mg via RECTAL
  Filled 2021-03-24 (×4): qty 1

## 2021-03-24 MED ORDER — MORPHINE SULFATE (PF) 2 MG/ML IV SOLN
2.0000 mg | INTRAVENOUS | Status: DC | PRN
Start: 2021-03-24 — End: 2021-03-27
  Administered 2021-03-24 – 2021-03-27 (×16): 2 mg via INTRAVENOUS
  Filled 2021-03-24 (×16): qty 1

## 2021-03-24 MED ORDER — CALCIUM CARBONATE ANTACID 500 MG PO CHEW: 2.0000 | CHEWABLE_TABLET | ORAL | Status: DC | PRN

## 2021-03-24 MED ORDER — ZOLPIDEM TARTRATE 5 MG PO TABS
5.0000 mg | ORAL_TABLET | Freq: Every evening | ORAL | Status: DC | PRN
  Administered 2021-03-29 – 2021-04-03 (×3): 5 mg via ORAL
  Filled 2021-03-24 (×3): qty 1

## 2021-03-24 MED ORDER — ACETAMINOPHEN 325 MG PO TABS: 650.0000 mg | ORAL_TABLET | ORAL | Status: DC | PRN

## 2021-03-24 MED ORDER — PRENATAL MULTIVITAMIN CH
1.0000 | ORAL_TABLET | Freq: Every day | ORAL | Status: DC
  Filled 2021-03-24 (×7): qty 1

## 2021-03-24 MED ORDER — CEFTRIAXONE SODIUM 1 G IJ SOLR
1.0000 g | INTRAMUSCULAR | Status: DC
  Administered 2021-03-24: 1 g via INTRAMUSCULAR

## 2021-03-24 MED ORDER — HYDROMORPHONE HCL 2 MG PO TABS
2.0000 mg | ORAL_TABLET | ORAL | Status: DC | PRN
  Administered 2021-03-24 – 2021-03-25 (×3): 2 mg via ORAL
  Filled 2021-03-24 (×3): qty 1

## 2021-03-24 NOTE — Addendum Note (Signed)
Addended by: Debarah Crape on: 03/24/2021 05:01 PM   Modules accepted: Level of Service

## 2021-03-24 NOTE — Progress Notes (Addendum)
I received a message from Dr. Valentino Saxon this morning explaining that her urinalysis at [facility] yesterday was notable for 2+ leukocytes concerning for ongoing infection.  We obtained a catheterized urine sample in clinic today for further testing and urine microscopy was notable for >30 WBCs/hpf, 11-30 RBCs/hpf, and moderate bacteria. Urine dip was nitrite negative.   I discussed these findings with Dr. Apolinar Junes, and we share the same concerns that she is failing outpatient antibiotics for management of a likely left renal abscess.  With persistent complicated UTI in this pregnant patient, we are concerned that she may need to be admitted for further intervention given that she has failed oral antibiotics as well.   I reached out to Dr. Valentino Saxon and Dr. Logan Bores with our concerns.  Dr. Logan Bores is on call today and agreed to direct admit the patient under OB/GYN this evening.  I spoke with Dr. Logan Bores via telephone and conveyed our urology recommendations that the patient undergo MRI abdomen and pelvis with contrast to further characterize her likely left renal abscess.  Based on these findings, we may need to consider getting interventional radiology involved for drainage of this likely left renal abscess.  I also discussed that depending on these findings, ID may need to get involved for antibiotic recommendations given her persistently infected appearing urine despite 9 days of IV and IM Rocephin and growing pansensitive E. coli on prior urine cultures.   Dr. Alvester Morin is on-call this weekend for urology and has been notified.  He will see the patient tomorrow once her MRI has resulted. MRI has been signed and pended for her impending admission.  Lastly, I spoke with the patient via telephone this afternoon and explained our concern for failure of outpatient therapy consistent with possible left renal abscess and counseled her to proceed to the admitting desk at [facility] ASAP.  She is arranging a ride with her mother and will be on  her way shortly.  Results for orders placed or performed in visit on 03/24/21  Microscopic Examination   Urine  Result Value Ref Range   WBC, UA >30 (A) 0 - 5 /hpf   RBC 11-30 (A) 0 - 2 /hpf   Epithelial Cells (non renal) 0-10 0 - 10 /hpf   Casts Present (A) None seen /lpf   Cast Type Hyaline casts N/A   Mucus, UA Present (A) Not Estab.   Bacteria, UA Moderate (A) None seen/Few  Urinalysis, Complete  Result Value Ref Range   Specific Gravity, UA 1.020 1.005 - 1.030   pH, UA 7.0 5.0 - 7.5   Color, UA Brown (A) Yellow   Appearance Ur Cloudy (A) Clear   Leukocytes,UA 3+ (A) Negative   Protein,UA 2+ (A) Negative/Trace   Glucose, UA Negative Negative   Ketones, UA Trace (A) Negative   RBC, UA 2+ (A) Negative   Bilirubin, UA Negative Negative   Urobilinogen, Ur 2.0 (H) 0.2 - 1.0 mg/dL   Nitrite, UA Negative Negative   Microscopic Examination See below:     Carman Ching, PA-C 03/24/21 4:59 PM  I spent 45 minutes on the day of the encounter to include pre-visit record review, face-to-face time with the patient, coordination of care, and post-visit ordering of tests.

## 2021-03-24 NOTE — Telephone Encounter (Addendum)
Error

## 2021-03-24 NOTE — H&P (Signed)
ADMIT NOTE  HPI:      Laura Sosa is a 35 y.o. [id number] who LMP was No LMP recorded (lmp unknown). Patient is pregnant.  Subjective: Patient seen in urology today and was noted to have continuing left flank pain, pyuria and hematuria.  She has been treated with 9 doses of Rocephin as an outpatient.  Concern is for a possible left renal abscess and thus failure of outpatient treatment.  She is being admitted for pain management, further evaluation of possible abscess (MRI) and antibiotics. Of significant note, approximately 2.5 weeks ago patient underwent ureteroscopy and laser management of ureteral calculi and subsequent stent placement by Dr. Apolinar Junes. She is currently 25 weeks and 2 days estimated gestational age.          HISTORY No Known Allergies  OB History  OB History  Gravida Para Term Preterm AB Living  3 2 2     2   SAB IAB Ectopic Multiple Live Births        0 2    # Outcome Date GA Lbr Len/2nd Weight Sex Delivery Anes PTL Lv  3 Current           2 Term 06/17/18 [redacted]w[redacted]d / 00:29 2780 g M Vag-Spont EPI  LIV  1 Term 2011   3799 g F Vag-Spont   LIV    Obstetric Comments  G1 - diagnosed with cleft palate at age 50    Past Medical History  Past Medical History:  Diagnosis Date   Bilateral kidney stones    Headache    History of kidney stones    Migraine    Pyelonephritis 04/17/2016    Past Surgical History  Past Surgical History:  Procedure Laterality Date   COLPOSCOPY     CYSTOSCOPY WITH STENT PLACEMENT Left 02/26/2021   Procedure: CYSTOSCOPY WITH STENT PLACEMENT;  Surgeon: 04/29/2021, MD;  Location: [facility] ORS;  Service: Urology;  Laterality: Left;   CYSTOSCOPY/RETROGRADE/URETEROSCOPY  03/15/2017   Procedure: CYSTOSCOPY/RETROGRADE/URETEROSCOPY;  Surgeon: 03/17/2017, MD;  Location: [facility] ORS;  Service: Urology;;   CYSTOSCOPY/URETEROSCOPY/HOLMIUM LASER/STENT PLACEMENT Left 03/06/2021   Procedure: CYSTOSCOPY/URETEROSCOPY/HOLMIUM  LASER/STENT PLACEMENT;  Surgeon: 05/07/2021, MD;  Location: [facility] ORS;  Service: Urology;  Laterality: Left;   LEEP        Past Social History:  Social History   Socioeconomic History   Marital status: Single    Spouse name: Not on file   Number of children: Not on file   Years of education: Not on file   Highest education level: Not on file  Occupational History   Not on file  Tobacco Use   Smoking status: Never   Smokeless tobacco: Never  Vaping Use   Vaping Use: Never used  Substance and Sexual Activity   Alcohol use: No   Drug use: No   Sexual activity: Yes  Other Topics Concern   Not on file  Social History Narrative   Not on file   Social Determinants of Health   Financial Resource Strain: Not on file  Food Insecurity: Not on file  Transportation Needs: Not on file  Physical Activity: Not on file  Stress: Not on file  Social Connections: Not on file    Family History  Family History  Problem Relation Age of Onset   Hypertension Mother    Diabetes Mother    Diabetes Father    Hypertension Father    Emphysema Maternal Grandmother  Lung cancer Maternal Grandmother    Asthma Daughter    Hypertension Paternal Grandmother    Heart defect Half-Sister        "boot shaped heart"   Kidney cancer Neg Hx    Bladder Cancer Neg Hx    Breast cancer Neg Hx    Ovarian cancer Neg Hx    Colon cancer Neg Hx      ROS: Constitutional: Denied constitutional symptoms, night sweats, recent illness, fatigue, fever, insomnia and weight loss.  Eyes: Denied eye symptoms, eye pain, photophobia, vision change and visual disturbance.  Ears/Nose/Throat/Neck: Denied ear, nose, throat or neck symptoms, hearing loss, nasal discharge, sinus congestion and sore throat.  Cardiovascular: Denied cardiovascular symptoms, arrhythmia, chest pain/pressure, edema, exercise intolerance, orthopnea and palpitations.  Respiratory: Denied pulmonary symptoms, asthma, pleuritic pain,  productive sputum, cough, dyspnea and wheezing.  Gastrointestinal: Denied, gastro-esophageal reflux, melena, nausea and vomiting.  Genitourinary: Denied genitourinary symptoms including symptomatic vaginal discharge, pelvic relaxation issues, and urinary complaints.  Musculoskeletal: Denied musculoskeletal symptoms, stiffness, swelling, muscle weakness and myalgia.  Dermatologic: Denied dermatology symptoms, rash and scar.  Neurologic: Denied neurology symptoms, dizziness, headache, neck pain and syncope.  Psychiatric: Denied psychiatric symptoms, anxiety and depression.  Endocrine: Denied endocrine symptoms including hot flashes and night sweats.   Medications    Current Discharge Medication List     CONTINUE these medications which have NOT CHANGED   Details  acetaminophen (TYLENOL) 325 MG tablet Take 2 tablets (650 mg total) by mouth every 4 (four) hours as needed (for pain scale < 4  OR  temperature  >/=  100.5 F). Qty: 60 tablet, Refills: 0    cyclobenzaprine (FLEXERIL) 10 MG tablet Take 1 tablet (10 mg total) by mouth 3 (three) times daily as needed for muscle spasms. Qty: 30 tablet, Refills: 2    docusate sodium (COLACE) 100 MG capsule Take 1 capsule (100 mg total) by mouth 2 (two) times daily as needed for mild constipation. Qty: 60 capsule, Refills: 1    ferrous sulfate 325 (65 FE) MG tablet Take 1 tablet (325 mg total) by mouth 2 (two) times daily with a meal. Qty: 60 tablet, Refills: 3    HYDROmorphone (DILAUDID) 4 MG tablet Take by mouth.    ondansetron (ZOFRAN-ODT) 4 MG disintegrating tablet Take 1 tablet (4 mg total) by mouth every 8 (eight) hours as needed for nausea or vomiting. Qty: 60 tablet, Refills: 0    Prenatal Vit-Fe Fumarate-FA (MULTIVITAMIN-PRENATAL) 27-0.8 MG TABS tablet Take 1 tablet by mouth daily at 12 noon.    promethazine (PHENERGAN) 12.5 MG suppository Place rectally.         Objective: Vitals:   03/24/21 1802  BP: 111/76  Pulse: 82  Resp:  18  Temp: (!) 97.3 F (36.3 C)  SpO2: 97%             ASSESSMENT:  Suspected renal abscess status post intraoperative management of ureteral calculi 2.  25-week intrauterine pregnancy  PLAN: 1.  Consult urology for management of:  Ureteral abscess -imaging and possible consultation with interventional radiology  Pain management for ureteral abscess/flank pain  Antibiotic choice as failure of outpatient Rocephin and all cultures show pansensitive E. Coli            (possibly may want to consult ID)    2.  Obstetrics will continue to monitor fetal heart rate and fetal wellbeing daily.   I spent 47 minutes involved in the care of this patient preparing to see the  patient by obtaining and reviewing her medical history (including labs, imaging tests and prior procedures), documenting clinical information in the electronic health record (EHR), counseling and coordinating care plans.  Brennan Bailey ,MD 03/24/2021,6:30 PM

## 2021-03-24 NOTE — Progress Notes (Signed)
In and Out Catheterization  Patient is present today for a I & O catheterization due to repeat UA. Patient was cleaned and prepped in a sterile fashion with betadine . A 14FR cath was inserted no complications were noted , 63ml of urine return was noted, urine was amber in color. A clean urine sample was collected for UA. Bladder was drained  And catheter was removed with out difficulty.    Performed by: Franchot Erichsen, CMA

## 2021-03-24 NOTE — Progress Notes (Signed)
IM Injection  Patient is present today for an IM Injection for treatment of renal abscess. Drug: Ceftriaxone Dose:1g Location:Right ventrogluteal Lot: [id number] Exp:05/2023 Patient tolerated well, no complications were noted  Performed by: Franchot Erichsen, CMA

## 2021-03-25 ENCOUNTER — Inpatient Hospital Stay: Payer: Medicaid Other

## 2021-03-25 DIAGNOSIS — R109 Unspecified abdominal pain: Secondary | ICD-10-CM | POA: Diagnosis not present

## 2021-03-25 DIAGNOSIS — O26832 Pregnancy related renal disease, second trimester: Secondary | ICD-10-CM | POA: Diagnosis not present

## 2021-03-25 DIAGNOSIS — J90 Pleural effusion, not elsewhere classified: Secondary | ICD-10-CM | POA: Diagnosis not present

## 2021-03-25 DIAGNOSIS — R0602 Shortness of breath: Secondary | ICD-10-CM | POA: Diagnosis present

## 2021-03-25 DIAGNOSIS — R0609 Other forms of dyspnea: Secondary | ICD-10-CM | POA: Diagnosis not present

## 2021-03-25 DIAGNOSIS — Z3A25 25 weeks gestation of pregnancy: Secondary | ICD-10-CM | POA: Diagnosis not present

## 2021-03-25 DIAGNOSIS — I517 Cardiomegaly: Secondary | ICD-10-CM | POA: Diagnosis not present

## 2021-03-25 DIAGNOSIS — N151 Renal and perinephric abscess: Secondary | ICD-10-CM | POA: Diagnosis not present

## 2021-03-25 DIAGNOSIS — N12 Tubulo-interstitial nephritis, not specified as acute or chronic: Secondary | ICD-10-CM | POA: Diagnosis not present

## 2021-03-25 DIAGNOSIS — O99512 Diseases of the respiratory system complicating pregnancy, second trimester: Secondary | ICD-10-CM | POA: Diagnosis not present

## 2021-03-25 DIAGNOSIS — J9811 Atelectasis: Secondary | ICD-10-CM | POA: Diagnosis not present

## 2021-03-25 LAB — BASIC METABOLIC PANEL
Anion gap: 10 (ref 5–15)
BUN: 7 mg/dL (ref 6–20)
CO2: 24 mmol/L (ref 22–32)
Calcium: 8.6 mg/dL — ABNORMAL LOW (ref 8.9–10.3)
Chloride: 100 mmol/L (ref 98–111)
Creatinine, Ser: 0.57 mg/dL (ref 0.44–1.00)
GFR, Estimated: >60 mL/min (ref >60)
Glucose, Bld: 77 mg/dL (ref 70–99)
Potassium: 3.2 mmol/L — ABNORMAL LOW (ref 3.5–5.1)
Sodium: 134 mmol/L — ABNORMAL LOW (ref 135–145)

## 2021-03-25 LAB — CBC
HCT: 31.2 % — ABNORMAL LOW (ref 36.0–46.0)
Hemoglobin: 10.2 g/dL — ABNORMAL LOW (ref 12.0–15.0)
MCH: 29.1 pg (ref 26.0–34.0)
MCHC: 32.7 g/dL (ref 30.0–36.0)
MCV: 89.1 fL (ref 80.0–100.0)
Platelets: 408 10*3/uL — ABNORMAL HIGH (ref 150–400)
RBC: 3.5 MIL/uL — ABNORMAL LOW (ref 3.87–5.11)
RDW: 13.1 % (ref 11.5–15.5)
WBC: 9.4 10*3/uL (ref 4.0–10.5)
nRBC: 0 % (ref 0.0–0.2)

## 2021-03-25 LAB — PROCALCITONIN: Procalcitonin: 0.16 ng/mL

## 2021-03-25 LAB — VITAMIN B12: Vitamin B-12: 237 pg/mL (ref 180–914)

## 2021-03-25 LAB — PROTIME-INR
INR: 1 (ref 0.8–1.2)
Prothrombin Time: 13.2 seconds (ref 11.4–15.2)

## 2021-03-25 LAB — C-REACTIVE PROTEIN: CRP: 5.7 mg/dL — ABNORMAL HIGH (ref <1.0)

## 2021-03-25 LAB — TROPONIN I (HIGH SENSITIVITY)
Troponin I (High Sensitivity): 4 ng/L (ref <18)
Troponin I (High Sensitivity): 4 ng/L (ref <18)

## 2021-03-25 LAB — SEDIMENTATION RATE: Sed Rate: 85 mm/hr — ABNORMAL HIGH (ref 0–20)

## 2021-03-25 LAB — TSH: TSH: 0.962 u[IU]/mL (ref 0.350–4.500)

## 2021-03-25 LAB — BRAIN NATRIURETIC PEPTIDE: B Natriuretic Peptide: 63.3 pg/mL (ref 0.0–100.0)

## 2021-03-25 LAB — SARS CORONAVIRUS 2 (TAT 6-24 HRS): SARS Coronavirus 2: NEGATIVE

## 2021-03-25 LAB — D-DIMER, QUANTITATIVE: D-Dimer, Quant: 3.43 ug/mL-FEU — ABNORMAL HIGH (ref 0.00–0.50)

## 2021-03-25 MED ORDER — ONDANSETRON HCL 4 MG/2ML IJ SOLN
4.0000 mg | Freq: Four times a day (QID) | INTRAMUSCULAR | Status: DC | PRN
  Administered 2021-03-25 – 2021-04-03 (×17): 4 mg via INTRAVENOUS
  Filled 2021-03-25 (×18): qty 2

## 2021-03-25 MED ORDER — POTASSIUM CHLORIDE 10 MEQ/100ML IV SOLN
10.0000 meq | INTRAVENOUS | Status: DC
  Administered 2021-03-25 (×4): 10 meq via INTRAVENOUS
  Filled 2021-03-25 (×4): qty 100

## 2021-03-25 MED ORDER — SODIUM CHLORIDE 0.9 % IV SOLN: INTRAVENOUS | Status: DC

## 2021-03-25 MED ORDER — POTASSIUM CHLORIDE CRYS ER 20 MEQ PO TBCR: 20.0000 meq | EXTENDED_RELEASE_TABLET | Freq: Two times a day (BID) | ORAL | Status: DC

## 2021-03-25 MED ORDER — IOHEXOL 350 MG/ML SOLN
75.0000 mL | Freq: Once | INTRAVENOUS | Status: AC | PRN
  Administered 2021-03-25: 75 mL via INTRAVENOUS

## 2021-03-25 MED ORDER — SODIUM CHLORIDE 0.9 % IV SOLN
2.0000 g | INTRAVENOUS | Status: DC
  Administered 2021-03-25 – 2021-04-03 (×10): 2 g via INTRAVENOUS
  Filled 2021-03-25 (×5): qty 2
  Filled 2021-03-25: qty 20
  Filled 2021-03-25 (×5): qty 2
  Filled 2021-03-25: qty 20

## 2021-03-25 MED ORDER — FUROSEMIDE 10 MG/ML IJ SOLN
20.0000 mg | Freq: Once | INTRAMUSCULAR | Status: AC
  Administered 2021-03-25: 20 mg via INTRAVENOUS
  Filled 2021-03-25: qty 2

## 2021-03-25 MED ORDER — VANCOMYCIN HCL 10 G IV SOLR
1500.0000 mg | Freq: Once | INTRAVENOUS | Status: AC
  Administered 2021-03-25: 1500 mg via INTRAVENOUS
  Filled 2021-03-25: qty 1500

## 2021-03-25 MED ORDER — VANCOMYCIN HCL 10 G IV SOLR
1250.0000 mg | Freq: Two times a day (BID) | INTRAVENOUS | Status: DC
  Filled 2021-03-25: qty 1250

## 2021-03-25 MED ORDER — HYDROMORPHONE HCL 1 MG/ML IJ SOLN
0.5000 mg | INTRAMUSCULAR | Status: DC | PRN
  Administered 2021-03-25 – 2021-03-27 (×13): 0.5 mg via INTRAVENOUS
  Filled 2021-03-25 (×13): qty 1

## 2021-03-25 MED ORDER — VANCOMYCIN HCL IN DEXTROSE 1-5 GM/200ML-% IV SOLN: 1000.0000 mg | Freq: Two times a day (BID) | INTRAVENOUS | Status: DC

## 2021-03-25 NOTE — Progress Notes (Signed)
Urology Inpatient Progress Report  Renal Abscess        Intv/Subj: No acute events overnight.  Patient complains about some shortness of breath especially with exertion.  She underwent MRI last night without contrast that revealed multiple left renal lesions measuring up to 4 cm similar to previous ultrasound with associated perinephric soft tissue stranding suspicious for pyelonephritis with multiple renal abscesses.  She also had a similar but smaller 2 cm lesion involving the left psoas muscle suspicious for small abscess.  There is no hydronephrosis.  She had a left pleural effusion.  She underwent a follow-up renal ultrasound in preparation for possible intervention this morning.  This revealed a 3.7 cm left upper pole intrarenal isoechoic collection compatible with evolving abscess.  There was no significant fluid component.  Previous adjacent left upper pole hypoechoic fluid collection appeared to have dispersed into the perinephric and retroperitoneal space when compared to the MRI from yesterday.  There was no hydronephrosis.  I personally spoke with Dr. Miles Costain with interventional radiology.  There is no collection that is amenable to aspiration.  Drain placement would be difficult without the use of CT imaging.  Given her stable status, recommendation was for observation with antibiotics.  I personally spoke with Dr Renold Don with infectious disease.  Recommendation is for 2 g of ceftriaxone daily.   Active Problems:   * No active hospital problems. *  Current Facility-Administered Medications  Medication Dose Route Frequency Provider Last Rate Last Admin   acetaminophen (TYLENOL) tablet 650 mg  650 mg Oral Q4H PRN Linzie Collin, MD       calcium carbonate (TUMS - dosed in mg elemental calcium) chewable tablet 400 mg of elemental calcium  2 tablet Oral Q4H PRN Linzie Collin, MD       cefTRIAXone (ROCEPHIN) 2 g in sodium chloride 0.9 % 100 mL IVPB  2 g Intravenous Q24H Ray Church  III, MD 200 mL/hr at 03/25/21 0940 2 g at 03/25/21 0940   HYDROmorphone (DILAUDID) tablet 2 mg  2 mg Oral Q3H PRN Ray Church III, MD   2 mg at 03/25/21 0944   morphine 2 MG/ML injection 2 mg  2 mg Intravenous Q3H PRN Ray Church III, MD   2 mg at 03/25/21 1100   prenatal multivitamin tablet 1 tablet  1 tablet Oral Q1200 Linzie Collin, MD       promethazine (PHENERGAN) suppository 12.5 mg  12.5 mg Rectal Q8H PRN Ray Church III, MD   12.5 mg at 03/25/21 0800   zolpidem (AMBIEN) tablet 5 mg  5 mg Oral QHS PRN Linzie Collin, MD         Objective: Vital: Vitals:   03/24/21 2335 03/25/21 0407 03/25/21 0814 03/25/21 1130  BP: 109/71 104/62 101/66 106/73  Pulse: 76 76 75 79  Resp: 18 18 18 20   Temp: 97.7 F (36.5 C) 98.4 F (36.9 C) 97.8 F (36.6 C) 98.1 F (36.7 C)  TempSrc: Oral Oral Oral Oral  SpO2: 98% 95% 96%   Weight:      Height:       I/Os: I/O last 3 completed shifts: In: 400 [P.O.:400] Out: 140 [Urine:140]  Physical Exam:  General: Patient is in no apparent distress Lungs: Normal respiratory effort, chest expands symmetrically. GI: The abdomen is soft and nontender without mass. Back: Mild left-sided flank tenderness Ext: lower extremities symmetric  Lab Results: Recent Labs    03/25/21 0906  WBC 9.4  HGB 10.2*  HCT 31.2*   Recent Labs    03/25/21 0906  NA 134*  K 3.2*  CL 100  CO2 24  GLUCOSE 77  BUN 7  CREATININE 0.57  CALCIUM 8.6*   Recent Labs    03/25/21 0906  INR 1.0   No results for input(s): LABURIN in the last 72 hours. Results for orders placed or performed during the hospital encounter of 03/24/21  SARS CORONAVIRUS 2 (TAT 6-24 HRS) Nasopharyngeal Nasopharyngeal Swab     Status: None   Collection Time: 03/24/21  6:15 PM   Specimen: Nasopharyngeal Swab  Result Value Ref Range Status   SARS Coronavirus 2 NEGATIVE NEGATIVE Final    Comment: (NOTE) SARS-CoV-2 target nucleic acids are NOT DETECTED.  The SARS-CoV-2  RNA is generally detectable in upper and lower respiratory specimens during the acute phase of infection. Negative results do not preclude SARS-CoV-2 infection, do not rule out co-infections with other pathogens, and should not be used as the sole basis for treatment or other patient management decisions. Negative results must be combined with clinical observations, patient history, and epidemiological information. The expected result is Negative.  Fact Sheet for Patients: HairSlick.no  Fact Sheet for Healthcare Providers: quierodirigir.com  This test is not yet approved or cleared by the [place] FDA and  has been authorized for detection and/or diagnosis of SARS-CoV-2 by FDA under an Emergency Use Authorization (EUA). This EUA will remain  in effect (meaning this test can be used) for the duration of the COVID-19 declaration under Se ction 564(b)(1) of the Act, 21 U.S.C. section 360bbb-3(b)(1), unless the authorization is terminated or revoked sooner.  Performed at [facility], [address], [place], [place] [address]     Studies/Results: MR ABDOMEN WO CONTRAST  Result Date: 03/24/2021 CLINICAL DATA:  Recent lithotripsy in stent placement for left renal calculus. Urinary tract infection and left renal hematoma versus abscess. Hyperemesis. Second trimester pregnancy. EXAM: MRI ABDOMEN WITHOUT CONTRAST TECHNIQUE: Multiplanar multisequence MR imaging was performed without the administration of intravenous contrast. No intravenous contrast administered due to the patient's pregnancy. COMPARISON:  Renal ultrasound on 03/13/2021 FINDINGS: Lower chest: Left pleural effusion is noted. Hepatobiliary: A 7 mm markedly T2 hyperintense lesion is seen in the medial segment of the left hepatic lobe, which cannot be definitively characterized without IV contrast, but most likely represents a benign hemangioma or cyst. No other liver  lesions identified. Gallbladder is unremarkable. No evidence of biliary ductal dilatation. Pancreas: No mass or inflammatory process visualized on this unenhanced exam. Spleen:  Within normal limits in size. Adrenals/Urinary tract: Both adrenal glands and right kidney are normal in appearance. Multiple lesions are seen involving the left kidney, largest in the upper pole measuring approximately 4 cm. These lesions show heterogeneous T2 signal intensity, with characterization limited by lack of IV contrast. Some lesions are more well-circumscribed than others. These lesions are similar in size to those shown on recent ultrasound. Left perinephric soft tissue stranding is seen, with similar lesion involving the left psoas muscle which shows central T2 hyperintensity and a T2 hypointense rim. This measures 2.2 x 1.2 cm on image 24/5. There is no evidence of hydronephrosis. Stomach/Bowel: Visualized portion unremarkable. Vascular/Lymphatic: No pathologically enlarged lymph nodes identified. No evidence of abdominal aortic aneurysm. Other:  None. Musculoskeletal:  No suspicious bone lesions identified. IMPRESSION: Multiple left renal lesions measuring up to 4 cm are similar to previous ultrasound, and associated perinephric soft tissue stranding is demonstrated. Although these  cannot be definitively characterized on this noncontrast exam, they are suspicious for pyelonephritis with multiple renal abscesses. Similar but smaller 2 cm lesion involving the left psoas muscle, also suspicious for small abscess. No evidence of hydronephrosis. Left pleural effusion. Electronically Signed   By: Danae Orleans M.D.   On: 03/24/2021 22:12   US RENAL  Result Date: 03/25/2021 CLINICAL DATA:  Left pyelonephritis complicated by renal abscess, [redacted] weeks pregnant, flank pain EXAM: RENAL / URINARY TRACT ULTRASOUND COMPLETE COMPARISON:  03/24/2021, 03/13/2021 FINDINGS: Right Kidney: Renal measurements: 12.2 x 4.7 x 5.1 cm = volume: 152  mL. Echogenicity within normal limits. No mass or hydronephrosis visualized. Left Kidney: Renal measurements: 10.7 x 6.3 x 5.4 cm = volume: 193 mL. Normal echogenicity and cortical thickness. Trace pelviectasis without significant obstruction or hydronephrosis. Upper pole intrarenal isoechoic abnormality measures 3.7 x 3.6 x 2.2 cm compatible with an evolving renal abscess without significant cystic or fluid component. Previous upper pole exophytic hypoechoic fluid collection appears to have dispersed into the perinephric and retroperitoneal space and is no longer well-circumscribed by ultrasound. Reactive left effusion noted. Bladder: Appears normal for degree of bladder distention. Other: None. IMPRESSION: 3.7 cm left upper pole intrarenal isoechoic collection is compatible with evolving renal abscess without significant fluid component. Previous adjacent left upper pole exophytic hypoechoic fluid collection appears to have dispersed into the perinephric and retroperitoneal space when compared to the MRI from yesterday. No hydronephrosis. These results were called by telephone at the time of interpretation on 03/25/2021 at 9:09 am to provider Bryar Rennie III , who verbally acknowledged these results. Electronically Signed   By: Judie Petit.  Shick M.D.   On: 03/25/2021 09:10    Assessment: Left pyelonephritis with left renal abscess  Plan: Ceftriaxone 2 g daily per infectious disease, Dr. Renold Don No interventional radiology intervention at this time per Dr. Miles Costain after review of ultrasound. Observe clinically.  Consider repeat imaging if clinical condition worsens or does not improve over the course of 3 to 5 days or so.  Fortunately she is clinically stable without systemic signs.  Main symptom of nausea, vomiting, pain are improved this morning. For her shortness of breath, I will call internal medicine to weigh in.  May be secondary to diaphragmatic irritation and the left pleural effusion. No acute urological  intervention recommended. Appreciate the collective care provided by OB/GYN, infectious disease, interventional radiology, and internal medicine.   Modena Slater, MD Urology 03/25/2021, 11:44 AM

## 2021-03-25 NOTE — Consult Note (Signed)
Pharmacy Antibiotic Note  Laura Sosa is a 35 y.o. female admitted on 03/24/2021 with  renal abscess .  Pharmacy has been consulted for vancomycin dosing. Patient is also ordered ceftriaxone.   Plan:  Vancomycin 1.5 g IV LD followed by maintenance regimen of vancomycin 1.25 g IV q12h --Calculated AUC:543 , Calculated Cmin:15.7 --Daily Scr per protocol while on IV vancomycin --Levels at steady state as clinically indicated --Follow-up culture results  Height: 5\' 3"  (160 cm) Weight: 69.4 kg (153 lb) IBW/kg (Calculated) : 52.4  Temp (24hrs), Avg:97.8 F (36.6 C), Min:97.3 F (36.3 C), Max:98.4 F (36.9 C)  Recent Labs  Lab 03/25/21 0906  WBC 9.4  CREATININE 0.57    Estimated Creatinine Clearance: 94.4 mL/min (by C-G formula based on SCr of 0.57 mg/dL).    No Known Allergies  Antimicrobials this admission: Vanco 7/30 >>  Ceftriaxone 7/30 >>  Dose adjustments this admission: N/A  Microbiology results: 7/29 UCx: pending  7/30 BCx: pending  Thank you for allowing pharmacy to be a part of this patient's care.  8/30 03/25/2021 10:24 AM

## 2021-03-25 NOTE — Progress Notes (Signed)
NST complete with Cat 1 tracing. No pt concerns. Reviewed by second verifier, [facility]

## 2021-03-25 NOTE — Consult Note (Signed)
[facility]    Date of Admission:  03/24/2021     Reason for Consult: sepsis, pyelo    Referring Provider: Alvester Morin (urology)   Lines:  Peripheral iv's  Abx: 7/29-c iv ceftriaxone  7/29 vanc        Assessment: 35 yo female week 25 pregnant, left ureteral stone s/p lithotripsy 2.5 week prior to admission, given IM ceftriaxone daily for concern GU infection, but admitted due to persistent left flank pain, hematuria, with imaging concerning for pyelo/renal abscess and left psoas myositis   I spoke with dr Alvester Morin, who after review with radiology about renal u/s no obvious abscess to drain and recommend continuing empiric abx  Recent urine cx from 7/18 polymicrobial Bcx this admission in progress from 7/30 Ucx this admission 7/29 also in progress  Despite recent ureteroscopy/lithotripsy, I suspect organism in play would be gram negative. I  have low suspicion for staph aureus  She received appropriate abx previously but I query IM administration if that was adequate for coverage  Plan: Continue ceftriaxone iv 2 gram daily Await bcx and ucx result Anticipate 3-4 weeks iv abx the chronic suppression until delivery Appreciate urology management Dr Marthann Schiller to return Monday and continue seeing patient     ------------------------------------------------ Active Problems:   * No active hospital problems. *    HPI: [place] R Joung is a 35 y.o. female 25 week pregnancy, left ureteral stone, recent urteroscopy with laser lithotripsy and left ureteral stent exchange, admitted for persistent left flank pain/hematuria found on mri imaging to have developed left renal abscess/pyelo  Patient last procedure was 7/11  She was give im ceftriaxone daily  However has persistent sx so urology team admitted her on 7/29 Had mra abdomen that suggest left renal abscesses and left psoas myositis  Dr Alvester Morin spoke with IR, and after review of  imaging/renal US no discrete fluid collection to sample  She has no fever this admission or leukocytosis. She is started on ceftriaxone iv; a dose of vancomycin was given Bcx in progress Ucx in progress  Recent ucx no distinct organism  Ob team is also following     Family History  Problem Relation Age of Onset   Hypertension Mother    Diabetes Mother    Diabetes Father    Hypertension Father    Emphysema Maternal Grandmother    Lung cancer Maternal Grandmother    Asthma Daughter    Hypertension Paternal Grandmother    Heart defect Half-Sister        "boot shaped heart"   Kidney cancer Neg Hx    Bladder Cancer Neg Hx    Breast cancer Neg Hx    Ovarian cancer Neg Hx    Colon cancer Neg Hx     Social History   Tobacco Use   Smoking status: Never   Smokeless tobacco: Never  Vaping Use   Vaping Use: Never used  Substance Use Topics   Alcohol use: No   Drug use: No    No Known Allergies  Review of Systems: ROS All Other ROS was negative, except mentioned above   Past Medical History:  Diagnosis Date   Bilateral kidney stones    Headache    History of kidney stones    Migraine    Pyelonephritis 04/17/2016       Scheduled Meds:  prenatal multivitamin  1 tablet Oral Q1200   Continuous Infusions:  cefTRIAXone (ROCEPHIN)  IV 2  g (03/25/21 0940)   PRN Meds:.acetaminophen, calcium carbonate, HYDROmorphone, morphine injection, promethazine, zolpidem   OBJECTIVE: Blood pressure 106/73, pulse 79, temperature 98.1 F (36.7 C), temperature source Oral, resp. rate 20, height 5\' 3"  (1.6 m), weight 69.4 kg, SpO2 96 %.  Physical Exam  Virtual consult note  Lab Results Lab Results  Component Value Date   WBC 9.4 03/25/2021   HGB 10.2 (L) 03/25/2021   HCT 31.2 (L) 03/25/2021   MCV 89.1 03/25/2021   PLT 408 (H) 03/25/2021    Lab Results  Component Value Date   CREATININE 0.57 03/25/2021   BUN 7 03/25/2021   NA 134 (L) 03/25/2021   K 3.2 (L)  03/25/2021   CL 100 03/25/2021   CO2 24 03/25/2021    Lab Results  Component Value Date   ALT 33 11/17/2020   AST 17 11/17/2020   ALKPHOS 81 11/17/2020   BILITOT 1.0 11/17/2020      Microbiology: Recent Results (from the past 240 hour(s))  Microscopic Examination     Status: Abnormal   Collection Time: 03/24/21 12:59 PM   Urine  Result Value Ref Range Status   WBC, UA >30 (A) 0 - 5 /hpf Final   RBC 11-30 (A) 0 - 2 /hpf Final   Epithelial Cells (non renal) 0-10 0 - 10 /hpf Final   Casts Present (A) None seen /lpf Final   Cast Type Hyaline casts N/A Final   Mucus, UA Present (A) Not Estab. Final   Bacteria, UA Moderate (A) None seen/Few Final  SARS CORONAVIRUS 2 (TAT 6-24 HRS) Nasopharyngeal Nasopharyngeal Swab     Status: None   Collection Time: 03/24/21  6:15 PM   Specimen: Nasopharyngeal Swab  Result Value Ref Range Status   SARS Coronavirus 2 NEGATIVE NEGATIVE Final    Comment: (NOTE) SARS-CoV-2 target nucleic acids are NOT DETECTED.  The SARS-CoV-2 RNA is generally detectable in upper and lower respiratory specimens during the acute phase of infection. Negative results do not preclude SARS-CoV-2 infection, do not rule out co-infections with other pathogens, and should not be used as the sole basis for treatment or other patient management decisions. Negative results must be combined with clinical observations, patient history, and epidemiological information. The expected result is Negative.  Fact Sheet for Patients: 03/26/21  Fact Sheet for Healthcare Providers: HairSlick.no  This test is not yet approved or cleared by the [place] FDA and  has been authorized for detection and/or diagnosis of SARS-CoV-2 by FDA under an Emergency Use Authorization (EUA). This EUA will remain  in effect (meaning this test can be used) for the duration of the COVID-19 declaration under Se ction 564(b)(1) of the  Act, 21 U.S.C. section 360bbb-3(b)(1), unless the authorization is terminated or revoked sooner.  Performed at [facility], [address] [place], [place]      Serology:    Imaging: If present, new imagings (plain films, ct scans, and mri) have been personally visualized and interpreted; radiology reports have been reviewed. Decision making incorporated into the Impression / Recommendations.  7/30 renal u/s 3.7 cm left upper pole intrarenal isoechoic collection is compatible with evolving renal abscess without significant fluid component.   Previous adjacent left upper pole exophytic hypoechoic fluid collection appears to have dispersed into the perinephric and retroperitoneal space when compared to the MRI from yesterday.   No hydronephrosis.   These results were called by telephone at the time of interpretation on 03/25/2021 at 9:09 am to  provider EUGENE BELL III , who verbally acknowledged these results  7/29 mri abdomen Multiple left renal lesions measuring up to 4 cm are similar to previous ultrasound, and associated perinephric soft tissue stranding is demonstrated. Although these cannot be definitively characterized on this noncontrast exam, they are suspicious for pyelonephritis with multiple renal abscesses.   Similar but smaller 2 cm lesion involving the left psoas muscle, also suspicious for small abscess.   No evidence of hydronephrosis.   Left pleural effusion.    Raymondo Band, MD [facility] [phone] pager    03/25/2021, 12:09 PM

## 2021-03-25 NOTE — Progress Notes (Signed)
Patient ID: Laura Sosa, female   DOB: [birth year], 35 y.o.   MRN: [id number]    Subjective:    Patient generally comfortable but continues to complain of left flank-upper posterior chest pain. We have discussed her current condition, change antibiotics, most recent ultrasound and MRI findings.  I have answered all of her questions to the best of my ability.  Objective:    Patient Vitals for the past 2 hrs:  BP Temp Temp src Pulse Resp SpO2  03/25/21 0814 101/66 97.8 F (36.6 C) Oral 75 18 96 %   Total I/O In: -  Out: 200 [Urine:200]  Labs: Results for orders placed or performed during the hospital encounter of 03/24/21 (from the past 24 hour(s))  SARS CORONAVIRUS 2 (TAT 6-24 HRS) Nasopharyngeal Nasopharyngeal Swab     Status: None   Collection Time: 03/24/21  6:15 PM   Specimen: Nasopharyngeal Swab  Result Value Ref Range   SARS Coronavirus 2 NEGATIVE NEGATIVE  CBC     Status: Abnormal   Collection Time: 03/25/21  9:06 AM  Result Value Ref Range   WBC 9.4 4.0 - 10.5 K/uL   RBC 3.50 (L) 3.87 - 5.11 MIL/uL   Hemoglobin 10.2 (L) 12.0 - 15.0 g/dL   HCT 70.1 (L) 77.9 - 39.0 %   MCV 89.1 80.0 - 100.0 fL   MCH 29.1 26.0 - 34.0 pg   MCHC 32.7 30.0 - 36.0 g/dL   RDW 30.0 92.3 - 30.0 %   Platelets 408 (H) 150 - 400 K/uL   nRBC 0.0 0.0 - 0.2 %  Basic metabolic panel     Status: Abnormal   Collection Time: 03/25/21  9:06 AM  Result Value Ref Range   Sodium 134 (L) 135 - 145 mmol/L   Potassium 3.2 (L) 3.5 - 5.1 mmol/L   Chloride 100 98 - 111 mmol/L   CO2 24 22 - 32 mmol/L   Glucose, Bld 77 70 - 99 mg/dL   BUN 7 6 - 20 mg/dL   Creatinine, Ser 7.62 0.44 - 1.00 mg/dL   Calcium 8.6 (L) 8.9 - 10.3 mg/dL   GFR, Estimated >26 >33 mL/min   Anion gap 10 5 - 15    Medications    Current Discharge Medication List     CONTINUE these medications which have NOT CHANGED   Details  acetaminophen (TYLENOL) 325 MG tablet Take 2 tablets (650 mg total) by mouth every 4 (four) hours  as needed (for pain scale < 4  OR  temperature  >/=  100.5 F). Qty: 60 tablet, Refills: 0    cyclobenzaprine (FLEXERIL) 10 MG tablet Take 1 tablet (10 mg total) by mouth 3 (three) times daily as needed for muscle spasms. Qty: 30 tablet, Refills: 2    docusate sodium (COLACE) 100 MG capsule Take 1 capsule (100 mg total) by mouth 2 (two) times daily as needed for mild constipation. Qty: 60 capsule, Refills: 1    ferrous sulfate 325 (65 FE) MG tablet Take 1 tablet (325 mg total) by mouth 2 (two) times daily with a meal. Qty: 60 tablet, Refills: 3    HYDROmorphone (DILAUDID) 4 MG tablet Take by mouth.    ondansetron (ZOFRAN-ODT) 4 MG disintegrating tablet Take 1 tablet (4 mg total) by mouth every 8 (eight) hours as needed for nausea or vomiting. Qty: 60 tablet, Refills: 0    Prenatal Vit-Fe Fumarate-FA (MULTIVITAMIN-PRENATAL) 27-0.8 MG TABS tablet Take 1 tablet by mouth daily at 12 noon.  promethazine (PHENERGAN) 12.5 MG suppository Place rectally.         Normal appropriate fetal heart tones for 25 weeks estimated gestational age  Assessment:    Urology managing suspected renal abscess.  Unable to drain using IR.  Antibiotics changed by ID.  Plan:    Continue antibiotics as directed by urology and ID  Pain management as appropriate  Follow for resolution of pyuria/hematuria  Fetal anatomy ultrasound (Monday)   I spent 35 minutes involved in the care of this patient preparing to see the patient by obtaining and reviewing her medical history (including labs, imaging tests and prior procedures), documenting clinical information in the electronic health record (EHR), counseling and coordinating care plans, writing and sending prescriptions, ordering tests or procedures and directly communicating with the patient by discussing pertinent items from her history and physical exam as well as detailing my assessment and plan as noted above so that she has an informed understanding.  All of  her questions were answered.   Elonda Husky, M.D. 03/25/2021 10:00 AM

## 2021-03-25 NOTE — Progress Notes (Signed)
Logan Bores MD request BID FHT with Doppler. RN used doppler to obtain fht of 130

## 2021-03-25 NOTE — Progress Notes (Signed)
Spoke to [employer] MD in regards to patient requesting something else for nausea, MD ordered Zofran iv q 6 hrs prn N/V and also spoke about pt not being able to tolerate PO right now and would like to be able to finish out IV K+ instead of doing po. MD stated ok to finish iv K+

## 2021-03-25 NOTE — Consult Note (Signed)
Hospitalist Consult Note  Laura R Casso OIZ:[id number] DOB: 06-Feb-1986 DOA: 03/24/2021  PCP: [facility]  Outpatient Specialists: Dr. Vanna Scotland, urology  I have personally briefly reviewed patient's old medical records in [facility] EMR.  Reason for consult: Shortness of breath  HPI: Laura Sosa is a 35 y.o. female with medical history significant for history of bilateral kidney stones, migraine headaches, pyelonephritis, [id number], who was admitted to the hospital under obstetrics and gynecologic service for left flank pain, pyuria, hematuria, pain management and IV antibiotics.  She was recently treated for urinary tract infection and left renal abscess failing outpatient therapy with 9 doses of Rocephin outpatient.  She is status post left ureteral stent exchange on 03/06/2021 and subsequent stent removal on 03/08/2021.  Hospitalist service was consulted for shortness of breath, worse with exertion.  At bedside patient was able to tell me her name, her age, current location, and current calendar year.  She notes that the shortness of breath has been ongoing for the last 2 and half weeks along with left-sided back and anterior rib/chest pain beneath her breasts.  She states that the pain is intermittent, sharp, occasionally 10 out of 10, worse with movement and exertion.  She states that the shortness of breath and pain has worsened over the last several days.  She states that the pain is controlled with IV pain medication.    She endorses baseline nausea and vomiting secondary to hyperemesis gravidarum.  She denies fever, trauma to her person, cough, dysuria, hematuria, diarrhea at this time.  She denies abdominal pain.  She states she lost approximately 25 pounds since being pregnant.   Social history: She denies tobacco, etoh, recreational drug use. Prior to pregnancy, she worked as a Ecologist and bar tender.   Vaccination history: She is not vaccinated for  covid-19  ROS: Constitutional: + weight change, no fever ENT/Mouth: no sore throat, no rhinorrhea Eyes: no eye pain, no vision changes Cardiovascular: + chest pain, + dyspnea,  no edema, no palpitations Respiratory: no cough, no sputum, no wheezing Gastrointestinal: + nausea, + vomiting, no diarrhea, no constipation Genitourinary: no urinary incontinence, no dysuria, no hematuria Musculoskeletal: no arthralgias, no myalgias Skin: no skin lesions, no pruritus, Neuro: + weakness, no loss of consciousness, no syncope Psych: no anxiety, no depression, + decrease appetite Heme/Lymph: no bruising, no bleeding  Hospital course: Patient was admitted under obstetric service as she is greater than [redacted] weeks pregnant.  She was seen by urology on 03/24/2021 for left flank pain, pyuria, hematuria.  There was concern of possible left renal abscess and thus failure of outpatient treatment.  She was admitted under obstetrics service for pain management, further evaluation of possible abscess, and add IV antibiotics.  Note: She was recently treated outpatient with 9 doses of Rocephin  Infectious disease and urology is following.  Blood cultures, urine cultures have been collected.  Patient was started on vancomycin and ceftriaxone per recommendations of infectious disease service.  Hospitalist service was consulted for shortness of breath.  Assessment/Plan  Principal Problem:   Shortness of breath Active Problems:   History of pyelonephritis   Hyperemesis gravidarum   Hyperemesis affecting pregnancy, antepartum   Pregnancy   Anemia of pregnancy in second trimester   # Shortness of breath with exertion and movement-etiology is likely multifactorial including current pregnancy, dilutional anemia in setting of pregnancy, and left pleural effusion.   - I will check troponin, BNP, procalcitonin, D-dimer, sed rate, CRP, TSH, B12 -  D-dimer was elevated greater than prior value - Recommend CTA of the  chest to assess for possible pulmonary embolism given increased risk for thrombosis in setting of pregnancy and to assess the size and or changes of the left pleural effusion - CTA resulted and was read as no central pulmonary embolism, technically limited.  Moderate to large left pleural effusion significantly increased from prior exam, tracking into the fissure.  Adjacent compressive atelectasis and subtotal atelectasis and or airspace disease of the left lower lobe.  Mild cardiomegaly.  Trace pericardial effusion. - Incentive spirometry, flutter valve, reps: of 5-10 every 2 hours while awake, RT consulted for incentive spirometry and flutter valve education - Recommend primary team to consult radiology for thoracentesis ultrasound-guided in the a.m. - Recommend Lasix 20 mg IV once,  - Continue strict I's and O's - Hospitalist team will continue to follow  Chart reviewed.   Thank you for the opportunity to be involved in Ms. Gugliotta's care.  DVT prophylaxis: Per primary  Past Medical History:  Diagnosis Date   Bilateral kidney stones    Headache    History of kidney stones    Migraine    Pyelonephritis 04/17/2016   Past Surgical History:  Procedure Laterality Date   COLPOSCOPY     CYSTOSCOPY WITH STENT PLACEMENT Left 02/26/2021   Procedure: CYSTOSCOPY WITH STENT PLACEMENT;  Surgeon: Crista Elliot, MD;  Location: [facility] ORS;  Service: Urology;  Laterality: Left;   CYSTOSCOPY/RETROGRADE/URETEROSCOPY  03/15/2017   Procedure: CYSTOSCOPY/RETROGRADE/URETEROSCOPY;  Surgeon: Hildred Laser, MD;  Location: [facility] ORS;  Service: Urology;;   CYSTOSCOPY/URETEROSCOPY/HOLMIUM LASER/STENT PLACEMENT Left 03/06/2021   Procedure: CYSTOSCOPY/URETEROSCOPY/HOLMIUM LASER/STENT PLACEMENT;  Surgeon: Vanna Scotland, MD;  Location: [facility] ORS;  Service: Urology;  Laterality: Left;   LEEP     Social History:  reports that she has never smoked. She has never used smokeless tobacco. She reports that she  does not drink alcohol and does not use drugs.  No Known Allergies Family History  Problem Relation Age of Onset   Hypertension Mother    Diabetes Mother    Diabetes Father    Hypertension Father    Emphysema Maternal Grandmother    Lung cancer Maternal Grandmother    Asthma Daughter    Hypertension Paternal Grandmother    Heart defect Half-Sister        "boot shaped heart"   Kidney cancer Neg Hx    Bladder Cancer Neg Hx    Breast cancer Neg Hx    Ovarian cancer Neg Hx    Colon cancer Neg Hx    Family history: Family history reviewed and not pertinent  Prior to Admission medications   Medication Sig Start Date End Date Taking? Authorizing Provider  acetaminophen (TYLENOL) 325 MG tablet Take 2 tablets (650 mg total) by mouth every 4 (four) hours as needed (for pain scale < 4  OR  temperature  >/=  100.5 F). 03/08/21   Hildred Laser, MD  cyclobenzaprine (FLEXERIL) 10 MG tablet Take 1 tablet (10 mg total) by mouth 3 (three) times daily as needed for muscle spasms. 03/23/21   Hildred Laser, MD  docusate sodium (COLACE) 100 MG capsule Take 1 capsule (100 mg total) by mouth 2 (two) times daily as needed for mild constipation. 03/08/21   Hildred Laser, MD  ferrous sulfate 325 (65 FE) MG tablet Take 1 tablet (325 mg total) by mouth 2 (two) times daily with a meal. 03/08/21   Hildred Laser, MD  HYDROmorphone (DILAUDID) 4  MG tablet Take by mouth. 03/20/21 03/25/21  [provider]  ondansetron (ZOFRAN-ODT) 4 MG disintegrating tablet Take 1 tablet (4 mg total) by mouth every 8 (eight) hours as needed for nausea or vomiting. 03/08/21   Hildred Laserherry, Anika, MD  Prenatal Vit-Fe Fumarate-FA (MULTIVITAMIN-PRENATAL) 27-0.8 MG TABS tablet Take 1 tablet by mouth daily at 12 noon.    [provider]  promethazine (PHENERGAN) 12.5 MG suppository Place rectally. 03/20/21 03/27/21  [provider]   Physical Exam: Vitals:   03/24/21 [id number] 03/25/21 0407 03/25/21 0814 03/25/21 1130  BP: 109/71  104/62 101/66 106/73  Pulse: 76 76 75 79  Resp: 18 18 18 20   Temp: 97.7 F (36.5 C) 98.4 F (36.9 C) 97.8 F (36.6 C) 98.1 F (36.7 C)  TempSrc: Oral Oral Oral Oral  SpO2: 98% 95% 96%   Weight:      Height:       Constitutional: appears age-appropriate, NAD, calm, comfortable Eyes: PERRL, lids and conjunctivae normal ENMT: Mucous membranes are moist. Posterior pharynx clear of any exudate or lesions. Age-appropriate dentition. Hearing appropriate Neck: normal, supple, no masses, no thyromegaly Respiratory: clear to auscultation bilaterally, no wheezing, no crackles. Normal respiratory effort. No accessory muscle use.  Cardiovascular: Regular rate and rhythm, no murmurs / rubs / gallops. No extremity edema. 2+ pedal pulses. No carotid bruits.  Abdomen: Gravid abdomen, no tenderness, no masses palpated, no hepatosplenomegaly. Bowel sounds positive.  Musculoskeletal: no clubbing / cyanosis. No joint deformity upper and lower extremities. Good ROM, no contractures, no atrophy. Normal muscle tone.  Skin: no rashes, lesions, ulcers. No induration.  Multiple skin tattoos in all extremities and trunk and shoulders all appear well-healed. Neurologic: Sensation intact. Strength 5/5 in all 4.  Psychiatric: Normal judgment and insight. Alert and oriented x 3. Normal mood.   EKG: independently reviewed, showing sinus rhythm with rate of 77, QTc 459  Chest x-ray on Admission: I personally reviewed and I agree with radiologist reading as below.  MR ABDOMEN WO CONTRAST  Result Date: 03/24/2021 CLINICAL DATA:  Recent lithotripsy in stent placement for left renal calculus. Urinary tract infection and left renal hematoma versus abscess. Hyperemesis. Second trimester pregnancy. EXAM: MRI ABDOMEN WITHOUT CONTRAST TECHNIQUE: Multiplanar multisequence MR imaging was performed without the administration of intravenous contrast. No intravenous contrast administered due to the patient's pregnancy. COMPARISON:   Renal ultrasound on 03/13/2021 FINDINGS: Lower chest: Left pleural effusion is noted. Hepatobiliary: A 7 mm markedly T2 hyperintense lesion is seen in the medial segment of the left hepatic lobe, which cannot be definitively characterized without IV contrast, but most likely represents a benign hemangioma or cyst. No other liver lesions identified. Gallbladder is unremarkable. No evidence of biliary ductal dilatation. Pancreas: No mass or inflammatory process visualized on this unenhanced exam. Spleen:  Within normal limits in size. Adrenals/Urinary tract: Both adrenal glands and right kidney are normal in appearance. Multiple lesions are seen involving the left kidney, largest in the upper pole measuring approximately 4 cm. These lesions show heterogeneous T2 signal intensity, with characterization limited by lack of IV contrast. Some lesions are more well-circumscribed than others. These lesions are similar in size to those shown on recent ultrasound. Left perinephric soft tissue stranding is seen, with similar lesion involving the left psoas muscle which shows central T2 hyperintensity and a T2 hypointense rim. This measures 2.2 x 1.2 cm on image 24/5. There is no evidence of hydronephrosis. Stomach/Bowel: Visualized portion unremarkable. Vascular/Lymphatic: No pathologically enlarged lymph nodes identified. No evidence  of abdominal aortic aneurysm. Other:  None. Musculoskeletal:  No suspicious bone lesions identified. IMPRESSION: Multiple left renal lesions measuring up to 4 cm are similar to previous ultrasound, and associated perinephric soft tissue stranding is demonstrated. Although these cannot be definitively characterized on this noncontrast exam, they are suspicious for pyelonephritis with multiple renal abscesses. Similar but smaller 2 cm lesion involving the left psoas muscle, also suspicious for small abscess. No evidence of hydronephrosis. Left pleural effusion. Electronically Signed   By: Danae Orleans M.D.   On: 03/24/2021 22:12   US RENAL  Result Date: 03/25/2021 CLINICAL DATA:  Left pyelonephritis complicated by renal abscess, [redacted] weeks pregnant, flank pain EXAM: RENAL / URINARY TRACT ULTRASOUND COMPLETE COMPARISON:  03/24/2021, 03/13/2021 FINDINGS: Right Kidney: Renal measurements: 12.2 x 4.7 x 5.1 cm = volume: 152 mL. Echogenicity within normal limits. No mass or hydronephrosis visualized. Left Kidney: Renal measurements: 10.7 x 6.3 x 5.4 cm = volume: 193 mL. Normal echogenicity and cortical thickness. Trace pelviectasis without significant obstruction or hydronephrosis. Upper pole intrarenal isoechoic abnormality measures 3.7 x 3.6 x 2.2 cm compatible with an evolving renal abscess without significant cystic or fluid component. Previous upper pole exophytic hypoechoic fluid collection appears to have dispersed into the perinephric and retroperitoneal space and is no longer well-circumscribed by ultrasound. Reactive left effusion noted. Bladder: Appears normal for degree of bladder distention. Other: None. IMPRESSION: 3.7 cm left upper pole intrarenal isoechoic collection is compatible with evolving renal abscess without significant fluid component. Previous adjacent left upper pole exophytic hypoechoic fluid collection appears to have dispersed into the perinephric and retroperitoneal space when compared to the MRI from yesterday. No hydronephrosis. These results were called by telephone at the time of interpretation on 03/25/2021 at 9:09 am to provider EUGENE BELL III , who verbally acknowledged these results. Electronically Signed   By: Judie Petit.  Shick M.D.   On: 03/25/2021 09:10    Labs on Admission: I have personally reviewed following labs  CBC: Recent Labs  Lab 03/25/21 0906  WBC 9.4  HGB 10.2*  HCT 31.2*  MCV 89.1  PLT 408*   Basic Metabolic Panel: Recent Labs  Lab 03/25/21 0906  NA 134*  K 3.2*  CL 100  CO2 24  GLUCOSE 77  BUN 7  CREATININE 0.57  CALCIUM 8.6*    GFR: Estimated Creatinine Clearance: 94.4 mL/min (by C-G formula based on SCr of 0.57 mg/dL).  Coagulation Profile: Recent Labs  Lab 03/25/21 0906  INR 1.0   Urine analysis:    Component Value Date/Time   COLORURINE YELLOW (A) 03/13/2021 1755   APPEARANCEUR Cloudy (A) 03/24/2021 1259   LABSPEC 1.017 03/13/2021 1755   PHURINE 8.0 03/13/2021 1755   GLUCOSEU Negative 03/24/2021 1259   HGBUR MODERATE (A) 03/13/2021 1755   BILIRUBINUR Negative 03/24/2021 1259   KETONESUR NEGATIVE 03/13/2021 1755   PROTEINUR 2+ (A) 03/24/2021 1259   PROTEINUR 100 (A) 03/13/2021 1755   UROBILINOGEN 0.2 03/23/2021 1456   NITRITE Negative 03/24/2021 1259   NITRITE NEGATIVE 03/13/2021 1755   LEUKOCYTESUR 3+ (A) 03/24/2021 1259   LEUKOCYTESUR LARGE (A) 03/13/2021 1755   Dr. Sedalia Muta [facility]  If 7PM-7AM, please contact overnight-coverage provider If 7AM-7PM, please contact day coverage provider [email or web address]  03/25/2021, 1:20 PM

## 2021-03-26 ENCOUNTER — Inpatient Hospital Stay: Payer: Medicaid Other

## 2021-03-26 ENCOUNTER — Encounter: Payer: Self-pay | Admitting: Obstetrics and Gynecology

## 2021-03-26 DIAGNOSIS — O211 Hyperemesis gravidarum with metabolic disturbance: Secondary | ICD-10-CM | POA: Diagnosis not present

## 2021-03-26 DIAGNOSIS — N136 Pyonephrosis: Secondary | ICD-10-CM | POA: Diagnosis not present

## 2021-03-26 DIAGNOSIS — Z3A27 27 weeks gestation of pregnancy: Secondary | ICD-10-CM | POA: Diagnosis not present

## 2021-03-26 DIAGNOSIS — J90 Pleural effusion, not elsewhere classified: Secondary | ICD-10-CM | POA: Diagnosis not present

## 2021-03-26 DIAGNOSIS — N12 Tubulo-interstitial nephritis, not specified as acute or chronic: Secondary | ICD-10-CM | POA: Diagnosis not present

## 2021-03-26 DIAGNOSIS — K224 Dyskinesia of esophagus: Secondary | ICD-10-CM | POA: Diagnosis present

## 2021-03-26 DIAGNOSIS — O99891 Other specified diseases and conditions complicating pregnancy: Secondary | ICD-10-CM | POA: Diagnosis not present

## 2021-03-26 DIAGNOSIS — R0902 Hypoxemia: Secondary | ICD-10-CM | POA: Diagnosis not present

## 2021-03-26 DIAGNOSIS — M25512 Pain in left shoulder: Secondary | ICD-10-CM | POA: Diagnosis not present

## 2021-03-26 DIAGNOSIS — Z28310 Unvaccinated for covid-19: Secondary | ICD-10-CM | POA: Diagnosis not present

## 2021-03-26 DIAGNOSIS — O99612 Diseases of the digestive system complicating pregnancy, second trimester: Secondary | ICD-10-CM | POA: Diagnosis present

## 2021-03-26 DIAGNOSIS — Z20822 Contact with and (suspected) exposure to covid-19: Secondary | ICD-10-CM | POA: Diagnosis not present

## 2021-03-26 DIAGNOSIS — O2302 Infections of kidney in pregnancy, second trimester: Secondary | ICD-10-CM | POA: Diagnosis not present

## 2021-03-26 DIAGNOSIS — F419 Anxiety disorder, unspecified: Secondary | ICD-10-CM | POA: Diagnosis present

## 2021-03-26 DIAGNOSIS — M62830 Muscle spasm of back: Secondary | ICD-10-CM | POA: Diagnosis not present

## 2021-03-26 DIAGNOSIS — R0602 Shortness of breath: Secondary | ICD-10-CM | POA: Diagnosis not present

## 2021-03-26 DIAGNOSIS — O99342 Other mental disorders complicating pregnancy, second trimester: Secondary | ICD-10-CM | POA: Diagnosis present

## 2021-03-26 DIAGNOSIS — O26833 Pregnancy related renal disease, third trimester: Secondary | ICD-10-CM | POA: Diagnosis not present

## 2021-03-26 DIAGNOSIS — I959 Hypotension, unspecified: Secondary | ICD-10-CM | POA: Diagnosis not present

## 2021-03-26 DIAGNOSIS — O26832 Pregnancy related renal disease, second trimester: Secondary | ICD-10-CM | POA: Diagnosis not present

## 2021-03-26 DIAGNOSIS — Z79891 Long term (current) use of opiate analgesic: Secondary | ICD-10-CM | POA: Diagnosis not present

## 2021-03-26 DIAGNOSIS — O99012 Anemia complicating pregnancy, second trimester: Secondary | ICD-10-CM | POA: Diagnosis not present

## 2021-03-26 DIAGNOSIS — B9620 Unspecified Escherichia coli [E. coli] as the cause of diseases classified elsewhere: Secondary | ICD-10-CM | POA: Diagnosis not present

## 2021-03-26 DIAGNOSIS — N151 Renal and perinephric abscess: Secondary | ICD-10-CM | POA: Diagnosis not present

## 2021-03-26 DIAGNOSIS — G894 Chronic pain syndrome: Secondary | ICD-10-CM | POA: Diagnosis not present

## 2021-03-26 DIAGNOSIS — I509 Heart failure, unspecified: Secondary | ICD-10-CM | POA: Diagnosis not present

## 2021-03-26 DIAGNOSIS — Z3A25 25 weeks gestation of pregnancy: Secondary | ICD-10-CM | POA: Diagnosis not present

## 2021-03-26 DIAGNOSIS — R0603 Acute respiratory distress: Secondary | ICD-10-CM | POA: Diagnosis not present

## 2021-03-26 DIAGNOSIS — O99282 Endocrine, nutritional and metabolic diseases complicating pregnancy, second trimester: Secondary | ICD-10-CM | POA: Diagnosis present

## 2021-03-26 DIAGNOSIS — Z79899 Other long term (current) drug therapy: Secondary | ICD-10-CM | POA: Diagnosis not present

## 2021-03-26 DIAGNOSIS — O99512 Diseases of the respiratory system complicating pregnancy, second trimester: Secondary | ICD-10-CM | POA: Diagnosis not present

## 2021-03-26 LAB — BODY FLUID CELL COUNT WITH DIFFERENTIAL
Eos, Fluid: 0 %
Lymphs, Fluid: 10 %
Monocyte-Macrophage-Serous Fluid: 7 %
Neutrophil Count, Fluid: 83 %
Total Nucleated Cell Count, Fluid: 8143 cu mm

## 2021-03-26 LAB — AMYLASE, PLEURAL OR PERITONEAL FLUID: Amylase, Fluid: 26 U/L

## 2021-03-26 LAB — MRSA NEXT GEN BY PCR, NASAL: MRSA by PCR Next Gen: NOT DETECTED

## 2021-03-26 LAB — LACTATE DEHYDROGENASE: LDH: 132 U/L (ref 98–192)

## 2021-03-26 LAB — LACTATE DEHYDROGENASE, PLEURAL OR PERITONEAL FLUID: LD, Fluid: 162 U/L — ABNORMAL HIGH (ref 3–23)

## 2021-03-26 LAB — BLOOD GAS, ARTERIAL
Acid-base deficit: 0 mmol/L (ref 0.0–2.0)
Bicarbonate: 23.6 mmol/L (ref 20.0–28.0)
FIO2: 0.28
O2 Saturation: 97.5 %
Patient temperature: 37
pCO2 arterial: 34 mmHg (ref 32.0–48.0)
pH, Arterial: 7.45 (ref 7.350–7.450)
pO2, Arterial: 92 mmHg (ref 83.0–108.0)

## 2021-03-26 LAB — GLUCOSE, PLEURAL OR PERITONEAL FLUID: Glucose, Fluid: 44 mg/dL

## 2021-03-26 LAB — URINE CULTURE: Culture: 20000 — AB

## 2021-03-26 LAB — CREATININE, SERUM
Creatinine, Ser: 0.6 mg/dL (ref 0.44–1.00)
GFR, Estimated: >60 mL/min (ref >60)

## 2021-03-26 MED ORDER — HYDROXYZINE HCL 25 MG PO TABS
25.0000 mg | ORAL_TABLET | Freq: Three times a day (TID) | ORAL | Status: DC | PRN
  Administered 2021-03-26 – 2021-04-03 (×10): 25 mg via ORAL
  Filled 2021-03-26 (×12): qty 1

## 2021-03-26 MED ORDER — SODIUM CHLORIDE 0.9 % IV SOLN: INTRAVENOUS | Status: DC | PRN

## 2021-03-26 MED ORDER — FUROSEMIDE 10 MG/ML IJ SOLN
40.0000 mg | Freq: Once | INTRAMUSCULAR | Status: AC
  Administered 2021-03-26: 40 mg via INTRAVENOUS
  Filled 2021-03-26: qty 4

## 2021-03-26 MED ORDER — LORAZEPAM 2 MG/ML IJ SOLN: 1.0000 mg | INTRAMUSCULAR | Status: DC | PRN

## 2021-03-26 MED ORDER — KETOROLAC TROMETHAMINE 15 MG/ML IJ SOLN
15.0000 mg | Freq: Three times a day (TID) | INTRAMUSCULAR | Status: DC
  Administered 2021-03-26: 15 mg via INTRAVENOUS
  Filled 2021-03-26: qty 1

## 2021-03-26 MED ORDER — LORAZEPAM 2 MG/ML IJ SOLN: 0.5000 mg | INTRAMUSCULAR | Status: DC | PRN

## 2021-03-26 MED ORDER — ACETAMINOPHEN 10 MG/ML IV SOLN
1000.0000 mg | Freq: Four times a day (QID) | INTRAVENOUS | Status: DC
  Administered 2021-03-27 (×2): 1000 mg via INTRAVENOUS
  Filled 2021-03-26 (×3): qty 100

## 2021-03-26 MED ORDER — CHLORHEXIDINE GLUCONATE CLOTH 2 % EX PADS
6.0000 | MEDICATED_PAD | Freq: Every day | CUTANEOUS | Status: DC
  Administered 2021-03-27 – 2021-03-30 (×4): 6 via TOPICAL

## 2021-03-26 MED ORDER — ACETAMINOPHEN 10 MG/ML IV SOLN
1000.0000 mg | Freq: Four times a day (QID) | INTRAVENOUS | Status: DC
Start: 2021-03-26 — End: 2021-03-26
  Administered 2021-03-26: 1000 mg via INTRAVENOUS
  Filled 2021-03-26 (×5): qty 100

## 2021-03-26 MED ORDER — METHOCARBAMOL 1000 MG/10ML IJ SOLN
500.0000 mg | Freq: Three times a day (TID) | INTRAVENOUS | Status: AC
  Administered 2021-03-26 – 2021-03-27 (×4): 500 mg via INTRAVENOUS
  Filled 2021-03-26 (×5): qty 5

## 2021-03-26 NOTE — Procedures (Signed)
Interventional Radiology Procedure Note  Procedure: [place] thora left  Complications: None  Estimated Blood Loss:  0  Findings: 575cc removed, labs sent    Sharen Counter, MD

## 2021-03-26 NOTE — Progress Notes (Signed)
Spoke to [employer] MD in regards to the patient fluids that were running for concern for fluid status. Pt was unable to tolerate potassium IV at the rate ordered when it was began in the previous day shift. Day shift nurse hung pts IV potassium at 45mL/hr instead of 151ml/hr with an extra 163ml/hr of NS running in hopes to dilute. MD ordered 109mL/hr of NS to dilute supp.

## 2021-03-26 NOTE — Progress Notes (Signed)
Pt c/o 9/10 despite having morphine and dilaudid. Pt states she cannot take a deep breath and cannot move her shoulder because of the pain. Patient visibly distraught from pain. Pt tachypneic and 02 dropping into low 90's, did drop down to 89% on room air. Patient placed on 2L 02 with improvement in 02 sats. MD notified, awaiting orders. Will continue to monitor. Florencia Reasons, RN

## 2021-03-26 NOTE — Progress Notes (Signed)
Pt with continued 8/10 pain despite pain medication, SOB, tachypnea, and feeling like she can not take a deep breath or move her left shoulder. Pt on 2L 02 with RR's fluctuating between high 20's to low 30's. MD notified, stated would speak to pulmonology. Will continue to monitor and await new orders. Florencia Reasons, RN

## 2021-03-26 NOTE — Progress Notes (Signed)
PROGRESS NOTE  Consult progress note.  Laura Sosa  PYY:[id number] DOB: 11/26/1985 DOA: 03/24/2021 PCP: [place]   Brief Narrative: Taken from consult note. Laura Sosa is a 35 y.o. female with medical history significant for history of bilateral kidney stones, migraine headaches, pyelonephritis, G3P2002, [redacted] weeks pregnant, who was admitted to the hospital under obstetrics and gynecologic service for left flank pain, pyuria, hematuria, pain management and IV antibiotics.   She was recently treated for urinary tract infection and left renal abscess failing outpatient therapy with 9 doses of Rocephin outpatient.  She is status post left ureteral stent exchange on 03/06/2021 and subsequent stent removal on 03/08/2021.  Per ID she will need 3 to 4 weeks of antibiotics for this persistent infection.  Imaging without any new abscess.   Hospitalist service was consulted for shortness of breath, worse with exertion.  Found to have large left pleural effusion-thoracentesis was ordered and done today, pending labs  Subjective: Patient was complaining of left flank pain and persistent exertional dyspnea.  No hypoxia.  Assessment & Plan:   Principal Problem:   Shortness of breath Active Problems:   History of pyelonephritis   Hyperemesis gravidarum   Hyperemesis affecting pregnancy, antepartum   Pregnancy   Anemia of pregnancy in second trimester  Exertional dyspnea.  Multifactorial with current pregnancy, dilutional anemia and left pleural effusion.  Troponin, BNP within normal limit.  Procalcitonin at 0.16, CRP of 5.7 and ESR of 85 with history of recent left renal abscess and urologic procedures.  D-dimer was mildly elevated but CTA was negative for PE. Thoracentesis was ordered yesterday-done later today with removal of more than 500 cc of fluid-pending labs. -Follow-up thoracentesis labs -Continue with incentive spirometry and flutter valve. -Continue with pain  management  History of recent left renal abscess/pyelonephritis/nephrolithiasis.  Patient had recent lithotripsy done, also has a stent placement followed by removal due to worsening pain.  ID and urology is also following. -Further management per ID and urology.  Objective: Vitals:   03/26/21 1040 03/26/21 1100 03/26/21 1118 03/26/21 1330  BP: 109/68  (!) 92/52   Pulse:  99 88 82  Resp:  (!) 31 16 (!) 27  Temp:   97.9 F (36.6 C)   TempSrc:   Oral   SpO2:  94%  93%  Weight:      Height:        Intake/Output Summary (Last 24 hours) at 03/26/2021 1331 Last data filed at 03/26/2021 0500 Gross per 24 hour  Intake 667.56 ml  Output 2050 ml  Net -1382.44 ml   Filed Weights   03/24/21 2100  Weight: 69.4 kg    Examination:  General exam: Appears calm and comfortable  Respiratory system: Somewhat decreased breath sounds at left base. Respiratory effort normal. Cardiovascular system: S1 & S2 heard, RRR.  Gastrointestinal system: Soft, nontender, gravid uterus, bowel sounds positive. Central nervous system: Alert and oriented. No focal neurological deficits.Symmetric 5 x 5 power. Extremities: No edema, no cyanosis, pulses intact and symmetrical. Skin: Multiple tattoos involving all of her body.   Psychiatry: Judgement and insight appear normal.     All the records are reviewed and case discussed with Care Management/Social Worker. Management plans discussed with the patient, nursing and they are in agreement.   Procedures:  Thoracentesis  Antimicrobials:  Ceftriaxone  Data Reviewed: I have personally reviewed following labs and imaging studies  CBC: Recent Labs  Lab 03/25/21 0906  WBC 9.4  HGB 10.2*  HCT  31.2*  MCV 89.1  PLT 027*   Basic Metabolic Panel: Recent Labs  Lab 03/25/21 0906 03/26/21 0651  NA 134*  --   K 3.2*  --   CL 100  --   CO2 24  --   GLUCOSE 77  --   BUN 7  --   CREATININE 0.57 0.60  CALCIUM 8.6*  --    GFR: Estimated Creatinine  Clearance: 94.4 mL/min (by C-G formula based on SCr of 0.6 mg/dL). Liver Function Tests: No results for input(s): AST, ALT, ALKPHOS, BILITOT, PROT, ALBUMIN in the last 168 hours. No results for input(s): LIPASE, AMYLASE in the last 168 hours. No results for input(s): AMMONIA in the last 168 hours. Coagulation Profile: Recent Labs  Lab 03/25/21 0906  INR 1.0   Cardiac Enzymes: No results for input(s): CKTOTAL, CKMB, CKMBINDEX, TROPONINI in the last 168 hours. BNP (last 3 results) No results for input(s): PROBNP in the last 8760 hours. HbA1C: No results for input(s): HGBA1C in the last 72 hours. CBG: No results for input(s): GLUCAP in the last 168 hours. Lipid Profile: No results for input(s): CHOL, HDL, LDLCALC, TRIG, CHOLHDL, LDLDIRECT in the last 72 hours. Thyroid Function Tests: Recent Labs    03/25/21 1332  TSH 0.962   Anemia Panel: Recent Labs    03/25/21 1332  VITAMINB12 237   Sepsis Labs: Recent Labs  Lab 03/25/21 1332  PROCALCITON 0.16    Recent Results (from the past 240 hour(s))  Microscopic Examination     Status: Abnormal   Collection Time: 03/24/21 12:59 PM   Urine  Result Value Ref Range Status   WBC, UA >30 (A) 0 - 5 /hpf Final   RBC 11-30 (A) 0 - 2 /hpf Final   Epithelial Cells (non renal) 0-10 0 - 10 /hpf Final   Casts Present (A) None seen /lpf Final   Cast Type Hyaline casts N/A Final   Mucus, UA Present (A) Not Estab. Final   Bacteria, UA Moderate (A) None seen/Few Final  CULTURE, URINE COMPREHENSIVE     Status: None (Preliminary result)   Collection Time: 03/24/21  1:50 PM   Specimen: Urine   UR  Result Value Ref Range Status   Urine Culture, Comprehensive Preliminary report  Preliminary   Organism ID, Bacteria Comment  Preliminary    Comment: No growth after 18-24 hours.  SARS CORONAVIRUS 2 (TAT 6-24 HRS) Nasopharyngeal Nasopharyngeal Swab     Status: None   Collection Time: 03/24/21  6:15 PM   Specimen: Nasopharyngeal Swab  Result  Value Ref Range Status   SARS Coronavirus 2 NEGATIVE NEGATIVE Final    Comment: (NOTE) SARS-CoV-2 target nucleic acids are NOT DETECTED.  The SARS-CoV-2 RNA is generally detectable in upper and lower respiratory specimens during the acute phase of infection. Negative results do not preclude SARS-CoV-2 infection, do not rule out co-infections with other pathogens, and should not be used as the sole basis for treatment or other patient management decisions. Negative results must be combined with clinical observations, patient history, and epidemiological information. The expected result is Negative.  Fact Sheet for Patients: SugarRoll.be  Fact Sheet for Healthcare Providers: https://www.woods-mathews.com/  This test is not yet approved or cleared by the [place] FDA and  has been authorized for detection and/or diagnosis of SARS-CoV-2 by FDA under an Emergency Use Authorization (EUA). This EUA will remain  in effect (meaning this test can be used) for the duration of the COVID-19 declaration under Se ction 564(b)(1)  of the Act, 21 U.S.C. section 360bbb-3(b)(1), unless the authorization is terminated or revoked sooner.  Performed at [facility], [place] [address], [place], [place] [address]   Urine Culture     Status: Abnormal   Collection Time: 03/24/21 10:49 PM   Specimen: Urine, Clean Catch  Result Value Ref Range Status   Specimen Description   Final    URINE, CLEAN CATCH Performed at [facility], [address], [place], [place] [address]    Special Requests   Final    NONE Performed at [facility], [place], [place] [address]    Culture 20,000 COLONIES/mL YEAST (A)  Final   Report Status 03/26/2021 FINAL  Final  Culture, blood (routine x 2)     Status: None (Preliminary result)   Collection Time: 03/25/21  9:06 AM   Specimen: BLOOD RIGHT HAND  Result Value Ref Range Status    Specimen Description BLOOD RIGHT HAND  Final   Special Requests   Final    BOTTLES DRAWN AEROBIC AND ANAEROBIC Blood Culture adequate volume   Culture   Final    NO GROWTH < 24 HOURS Performed at [facility], [address] [place], [place] [address]    Report Status PENDING  Incomplete  Culture, blood (routine x 2)     Status: None (Preliminary result)   Collection Time: 03/25/21  9:20 AM   Specimen: BLOOD LEFT HAND  Result Value Ref Range Status   Specimen Description BLOOD LEFT HAND  Final   Special Requests   Final    BOTTLES DRAWN AEROBIC AND ANAEROBIC Blood Culture adequate volume   Culture   Final    NO GROWTH < 24 HOURS Performed at [facility], [address], [place], [place] [address]    Report Status PENDING  Incomplete     Radiology Studies: CT Angio Chest Pulmonary Embolism (PE) W or WO Contrast  Result Date: 03/25/2021 CLINICAL DATA:  Chest pain or SOB, pleurisy or effusion suspected PE suspected, low/intermediate prob, positive D-dimer Pregnant patient at [redacted] weeks gestation EXAM: CT ANGIOGRAPHY CHEST WITH CONTRAST TECHNIQUE: Multidetector CT imaging of the chest was performed using the standard protocol during bolus administration of intravenous contrast. Multiplanar CT image reconstructions and MIPs were obtained to evaluate the vascular anatomy. CONTRAST:  3m OMNIPAQUE IOHEXOL 350 MG/ML SOLN COMPARISON:  CT angio 03/13/2021 FINDINGS: Cardiovascular: Technically limited exam due to contrast bolus timing and patient motion artifact. Majority of the contrast is within the venous structures in the aorta. Exam is diagnostic to the lobar level for pulmonary embolus. No intraluminal filling defects are seen. Mild cardiomegaly is again seen. Trace pericardial effusion. Mediastinum/Nodes: Scattered small mediastinal lymph nodes not enlarged by size criteria. No convincing hilar adenopathy, left hilar evaluation is limited by adjacent airspace disease.  Esophagus is mildly tortuous, minimal intraluminal fluid in the distal esophagus. Lungs/Pleura: Left pleural effusion is significantly increased from prior exam, now moderate to large. There is fluid tracking into the fissure. No convincing pleural enhancement or nodularity. There is adjacent compressive atelectasis in the left upper lobe. Near complete compressive atelectasis and or airspace disease in the left lower lobe. Other than minor dependent atelectasis, the right lung is clear. No findings of pulmonary edema. No right pleural effusion. Trace right pleural thickening/effusion. Upper Abdomen: No acute findings. Musculoskeletal: There are no acute or suspicious osseous abnormalities. Review of the MIP images confirms the above findings. IMPRESSION: 1. No central pulmonary embolus.  Technically limited exam due to contrast bolus timing and patient motion artifact. 2. Moderate to large left pleural effusion, significantly increased from prior exam, now moderate to large in tracking into the fissure. No convincing pleural enhancement or nodularity. Adjacent compressive atelectasis. There is also subtotal atelectasis and/or airspace disease of the left lower lobe. 3. Mild cardiomegaly. Trace pericardial effusion. Electronically Signed   By: Keith Rake M.D.   On: 03/25/2021 18:41   MR ABDOMEN WO CONTRAST  Result Date: 03/24/2021 CLINICAL DATA:  Recent lithotripsy in stent placement for left renal calculus. Urinary tract infection and left renal hematoma versus abscess. Hyperemesis. Second trimester pregnancy. EXAM: MRI ABDOMEN WITHOUT CONTRAST TECHNIQUE: Multiplanar multisequence MR imaging was performed without the administration of intravenous contrast. No intravenous contrast administered due to the patient's pregnancy. COMPARISON:  Renal ultrasound on 03/13/2021 FINDINGS: Lower chest: Left pleural effusion is noted. Hepatobiliary: A 7 mm markedly T2 hyperintense lesion is seen in the medial segment of  the left hepatic lobe, which cannot be definitively characterized without IV contrast, but most likely represents a benign hemangioma or cyst. No other liver lesions identified. Gallbladder is unremarkable. No evidence of biliary ductal dilatation. Pancreas: No mass or inflammatory process visualized on this unenhanced exam. Spleen:  Within normal limits in size. Adrenals/Urinary tract: Both adrenal glands and right kidney are normal in appearance. Multiple lesions are seen involving the left kidney, largest in the upper pole measuring approximately 4 cm. These lesions show heterogeneous T2 signal intensity, with characterization limited by lack of IV contrast. Some lesions are more well-circumscribed than others. These lesions are similar in size to those shown on recent ultrasound. Left perinephric soft tissue stranding is seen, with similar lesion involving the left psoas muscle which shows central T2 hyperintensity and a T2 hypointense rim. This measures 2.2 x 1.2 cm on image 24/5. There is no evidence of hydronephrosis. Stomach/Bowel: Visualized portion unremarkable. Vascular/Lymphatic: No pathologically enlarged lymph nodes identified. No evidence of abdominal aortic aneurysm. Other:  None. Musculoskeletal:  No suspicious bone lesions identified. IMPRESSION: Multiple left renal lesions measuring up to 4 cm are similar to previous ultrasound, and associated perinephric soft tissue stranding is demonstrated. Although these cannot be definitively characterized on this noncontrast exam, they are suspicious for pyelonephritis with multiple renal abscesses. Similar but smaller 2 cm lesion involving the left psoas muscle, also suspicious for small abscess. No evidence of hydronephrosis. Left pleural effusion. Electronically Signed   By: Marlaine Hind M.D.   On: 03/24/2021 22:12   US RENAL  Result Date: 03/25/2021 CLINICAL DATA:  Left pyelonephritis complicated by renal abscess, [redacted] weeks pregnant, flank pain EXAM:  RENAL / URINARY TRACT ULTRASOUND COMPLETE COMPARISON:  03/24/2021, 03/13/2021 FINDINGS: Right Kidney: Renal measurements: 12.2 x 4.7 x 5.1 cm = volume: 152 mL. Echogenicity within normal limits. No mass or hydronephrosis visualized. Left Kidney: Renal measurements: 10.7 x 6.3 x 5.4 cm = volume: 193 mL. Normal echogenicity and cortical thickness. Trace pelviectasis without significant obstruction or hydronephrosis. Upper pole intrarenal isoechoic abnormality measures 3.7 x 3.6 x 2.2 cm compatible with an evolving renal abscess without significant cystic or fluid component. Previous upper pole exophytic hypoechoic fluid collection appears to have dispersed into the perinephric and retroperitoneal space and is no longer well-circumscribed by ultrasound. Reactive left effusion noted. Bladder: Appears normal for degree of bladder distention. Other: None. IMPRESSION: 3.7 cm left upper pole intrarenal isoechoic collection is compatible with evolving renal abscess without significant fluid component. Previous adjacent left upper pole exophytic  hypoechoic fluid collection appears to have dispersed into the perinephric and retroperitoneal space when compared to the MRI from yesterday. No hydronephrosis. These results were called by telephone at the time of interpretation on 03/25/2021 at 9:09 am to provider EUGENE BELL III , who verbally acknowledged these results. Electronically Signed   By: Jerilynn Mages.  Shick M.D.   On: 03/25/2021 09:10   DG Chest Port 1 View  Result Date: 03/26/2021 CLINICAL DATA:  Status post left thoracentesis EXAM: PORTABLE CHEST 1 VIEW COMPARISON:  03/25/2021 FINDINGS: Low lung volumes with central vascular congestion and basilar atelectasis. Persistent lingula and left lower lobe collapse/consolidation. Small residual left effusion following thoracentesis. No pneumothorax. Trachea midline. Cardiac silhouette is obscured. IMPRESSION: Negative for pneumothorax following left thoracentesis. Electronically  Signed   By: Jerilynn Mages.  Shick M.D.   On: 03/26/2021 10:50   US THORACENTESIS ASP PLEURAL SPACE W/IMG GUIDE  Result Date: 03/26/2021 INDICATION: Shortness of breath, chest pain, left pleural effusion EXAM: ULTRASOUND GUIDED LEFT THORACENTESIS MEDICATIONS: 1% lidocaine local COMPLICATIONS: None immediate. PROCEDURE: An ultrasound guided thoracentesis was thoroughly discussed with the patient and questions answered. The benefits, risks, alternatives and complications were also discussed. The patient understands and wishes to proceed with the procedure. Written consent was obtained. Ultrasound was performed to localize and mark an adequate pocket of fluid in the left chest. The area was then prepped and draped in the normal sterile fashion. 1% Lidocaine was used for local anesthesia. Under ultrasound guidance a 6 Fr Safe-T-Centesis catheter was introduced. Thoracentesis was performed. The catheter was removed and a dressing applied. FINDINGS: A total of approximately 575 cc of serosanguineous pleural fluid was removed. Samples were sent to the laboratory as requested by the clinical team. IMPRESSION: Successful ultrasound guided left thoracentesis yielding 575 cc of pleural fluid. Electronically Signed   By: Jerilynn Mages.  Shick M.D.   On: 03/26/2021 10:48    Scheduled Meds:  prenatal multivitamin  1 tablet Oral Q1200   Continuous Infusions:  sodium chloride Stopped (03/26/21 1118)   cefTRIAXone (ROCEPHIN)  IV 2 g (03/26/21 0932)     LOS: 1 day   Time spent: 36 minutes. More than 50% of the time was spent in counseling/coordination of care  Lorella Nimrod, MD [facility]  If 7PM-7AM, please contact night-coverage [email or web address]  03/26/2021, 1:31 PM   This record has been created using Systems analyst. Errors have been sought and corrected,but may not always be located. Such creation errors do not reflect on the standard of care.

## 2021-03-26 NOTE — Progress Notes (Signed)
Patient ID: Laura Sosa, female   DOB: [birth year], 35 y.o.   MRN: [id number]    Subjective:    Had thoracentesis this morning.  Now having some residual chest pain and back pain. (Expected after procedure)  Objective:    Patient Vitals for the past 2 hrs:  BP  03/26/21 1040 109/68  03/26/21 0959 (!) 98/58   No intake/output data recorded.  Labs: Results for orders placed or performed during the hospital encounter of 03/24/21 (from the past 24 hour(s))  Troponin I (High Sensitivity)     Status: None   Collection Time: 03/25/21  1:32 PM  Result Value Ref Range   Troponin I (High Sensitivity) 4 <18 ng/L  Brain natriuretic peptide     Status: None   Collection Time: 03/25/21  1:32 PM  Result Value Ref Range   B Natriuretic Peptide 63.3 0.0 - 100.0 pg/mL  Procalcitonin - Baseline     Status: None   Collection Time: 03/25/21  1:32 PM  Result Value Ref Range   Procalcitonin 0.16 ng/mL  Sedimentation rate     Status: Abnormal   Collection Time: 03/25/21  1:32 PM  Result Value Ref Range   Sed Rate 85 (H) 0 - 20 mm/hr  C-reactive protein     Status: Abnormal   Collection Time: 03/25/21  1:32 PM  Result Value Ref Range   CRP 5.7 (H) <1.0 mg/dL  TSH     Status: None   Collection Time: 03/25/21  1:32 PM  Result Value Ref Range   TSH 0.962 0.350 - 4.500 uIU/mL  Vitamin B12     Status: None   Collection Time: 03/25/21  1:32 PM  Result Value Ref Range   Vitamin B-12 237 180 - 914 pg/mL  D-dimer, quantitative     Status: Abnormal   Collection Time: 03/25/21  1:32 PM  Result Value Ref Range   D-Dimer, Quant 3.43 (H) 0.00 - 0.50 ug/mL-FEU  Troponin I (High Sensitivity)     Status: None   Collection Time: 03/25/21  3:18 PM  Result Value Ref Range   Troponin I (High Sensitivity) 4 <18 ng/L  Creatinine, serum     Status: None   Collection Time: 03/26/21  6:51 AM  Result Value Ref Range   Creatinine, Ser 0.60 0.44 - 1.00 mg/dL   GFR, Estimated >79 >44 mL/min    Medications     Current Discharge Medication List     CONTINUE these medications which have NOT CHANGED   Details  acetaminophen (TYLENOL) 325 MG tablet Take 2 tablets (650 mg total) by mouth every 4 (four) hours as needed (for pain scale < 4  OR  temperature  >/=  100.5 F). Qty: 60 tablet, Refills: 0    cyclobenzaprine (FLEXERIL) 10 MG tablet Take 1 tablet (10 mg total) by mouth 3 (three) times daily as needed for muscle spasms. Qty: 30 tablet, Refills: 2    docusate sodium (COLACE) 100 MG capsule Take 1 capsule (100 mg total) by mouth 2 (two) times daily as needed for mild constipation. Qty: 60 capsule, Refills: 1    ferrous sulfate 325 (65 FE) MG tablet Take 1 tablet (325 mg total) by mouth 2 (two) times daily with a meal. Qty: 60 tablet, Refills: 3    ondansetron (ZOFRAN-ODT) 4 MG disintegrating tablet Take 1 tablet (4 mg total) by mouth every 8 (eight) hours as needed for nausea or vomiting. Qty: 60 tablet, Refills: 0    Prenatal Vit-Fe Fumarate-FA (  MULTIVITAMIN-PRENATAL) 27-0.8 MG TABS tablet Take 1 tablet by mouth daily at 12 noon.    promethazine (PHENERGAN) 12.5 MG suppository Place rectally.       STOP taking these medications     HYDROmorphone (DILAUDID) 4 MG tablet Comments:  Reason for Stopping:         removed at thoracentesis.   Assessment/Plan:    Renal abscess s/p ureteral calculi - cont ABX  Pleural effusion - removed by thoracentesis.  Follow SOB  25wk IUP - doing well. - Anatomy scan tomorrow.   I spent 28 minutes involved in the care of this patient preparing to see the patient by obtaining and reviewing her medical history (including labs, imaging tests and prior procedures),documenting clinical information in the electronic health record (EHR), counseling and coordinating care plans, writing and sending prescriptions, ordering tests or procedures and directly communicating with the patient by discussing pertinent items from her history and physical exam as  well as detailing my assessment and plan as noted above so that she has an informed understanding.  All of her questions were answered.  Elonda Husky, M.D. 03/26/2021 10:54 AM

## 2021-03-26 NOTE — Consult Note (Signed)
NAME:  Laura Sosa, MRN:  [id number], DOB:  27-Sep-1985, LOS: 1 ADMISSION DATE:  03/24/2021, CONSULTATION DATE:  03/26/2021 REFERRING MD:  Dr. Toniann Fail, CHIEF COMPLAINT:   LEFT flank pain  History of Present Illness:  35 yo F, who is [redacted] weeks pregnant, presented to [facility] for admission on 03/24/21 by OB-gyn at urology's request to assist with pain management and further evaluation of possible abscess & antibiotic coverage in the setting of continued LEFT flank pain/ pyuria/ hematuria. The patient underwent ureteroscopy/cystoscopy & laser management of ureteral calculi with subsequent stent placement by Dr. Apolinar Junes on 03/06/21. Patient received 9 doses of ceftriaxone outpatient without symptom relief.  Hospital course: Urology is consulted for management of ureteral abscess with OB-gyn monitoring fetal heart rate & fetal well being.  MRI without contrast 03/24/2021 >>revealed multiple left renal lesions measuring up to 4 cm similar to previous ultrasound with associated perinephric soft tissue stranding suspicious for pyelonephritis and multiple renal abscesses. Left pleural effusion. Renal ultrasound 03/25/2021>> revealed a 3.7 cm left upper pole intrarenal isoechoic collection compatible with evolving abscess.  Per urology's assessment the ultrasound also showed that previous adjacent left upper pole hypoechoic fluid collection appears to have dispersed into the perinephric and retroperitoneal space when compared to the MRI.  Negative for hydronephrosis. Dr. Denny Levy with interventional radiology was consulted and there is no collection that is amenable to aspiration.  Dr. Renold Don with infectious disease was consulted and recommended to continue ceftriaxone IV 2 g daily.    TRH service was consulted on 03/25/2021 due to complaints of shortness of breath with exertion and movement.  Patient was worked up for pulmonary embolus.   CT angio chest 03/25/2021 >> negative for PE, but does show moderate to large left  pleural effusion significantly increased from prior exam with adjacent compressive atelectasis.  Mild cardiomegaly and trace pericardial effusion.  On 03/26/2021 Dr. Denny Levy with IR performed a left thoracentesis and removed 575 mL of pleural fluid.   Postthoracentesis patient reported continued shortness of breath with tachypnea as well as pain in her left shoulder refractory to IV Dilaudid and morphine. CXR 03/26/21>> (POST- thoracentesis) persistent moderate left pleural effusion with possible underlying consolidation versus atelectasis.  No pneumothorax. Due to the patient's ongoing tachypnea, uncontrolled pain and concerns for respiratory distress patient was transferred to stepdown unit with PCCM consulted for further management and monitoring.  Pertinent  Medical History  Bilateral kidney stones Pyelonephritis  Significant Hospital Events: Including procedures, antibiotic start and stop dates in addition to other pertinent events   03/24/21- admit to Mother/baby by OB-gyn for pain control and IV antibiotics in the setting of suspected pyelonephritis with possible abscess 03/25/21- Urology & ID consulted.  Recommendations for Rocephin coverage, IR consulted but no collection amenable to aspiration.  TRH consulted for shortness of breath, work-up for PE negative but did reveal large pleural effusion. 03/26/21-IR consulted and left thoracentesis performed removing 575 mL of pleural fluid.  Later in evening transferred to ICU with concerns of respiratory distress in the setting of poor pain control and pleural effusion.  PCCM consulted.  Interim History / Subjective: Labs/ Imaging personally reviewed > see descriptions of CT/ultrasound/CT angio/chest x-ray above in HPI.  Postthoracentesis patient reports increased muscular pain on left side.  She reports coughing and feeling as though her "entire back locked up", she is unable to raise her arm over her head due to shoulder pain, and cannot lay on her  left side.  This pain is reproducible &  10/10, being poorly controlled with dilaudid & morphine. Patient states rest helps somewhat, but does feels sharp spasms intermittently and with exertion. Patient is tachypneic but able to speak in complete sentences. SpO2 has remained stable on 2 L [facility].  - Pleural fluid analysis indicative of an exudative effusion, possibly para pneumonic. No pleural protein or pH to review, cytology and gram stain pending.  I, Cheryll Cockayne Rust-Chester, AGACNP-BC, personally viewed and interpreted this ECG. EKG Interpretation Date: 03/25/21 EKG Time: 14:39 Rate: 77 Rhythm: NSR QRS Axis:  normal Intervals: Normal ST/T Wave abnormalities: None Narrative Interpretation: NSR  Net: +500 mL (-800 mL since admit) Na+/ K+: 134/3.2 BUN/Cr.:  7/0.57 Serum CO2/ AG: 24/10  Hgb: 10.2 Troponin: 4 > 4 BNP: 63.3 WBC/ TMAX: 9.4/afebrile PCT: 0.16  CRP: 5.7 Sed rate: 85 D-dimer: 3.43  LDH: 132  Pleural fluid: LDH-162, cloudy, amylase-26, glucose-44, TNC-8,143 ABG: 7.45/34/92/23.6 Objective   Blood pressure 120/74, pulse 97, temperature 97.9 F (36.6 C), temperature source Oral, resp. rate (!) 36, height 5\' 3"  (1.6 m), weight 69.4 kg, SpO2 94 %.        Intake/Output Summary (Last 24 hours) at 03/26/2021 2127 Last data filed at 03/26/2021 2014 Gross per 24 hour  Intake 1192.18 ml  Output 2050 ml  Net -857.82 ml   Filed Weights   03/24/21 2100  Weight: 69.4 kg    Examination: General: Adult female, acutely ill, lying in bed, appearing anxious and in discomfort but NAD HEENT: MM pink/moist, anicteric, atraumatic, neck supple Neuro: A&O x 4, follows commands, PERRL +3, MAE CV: s1s2 RRR, NSR on monitor, no r/m/g Pulm: Regular, tachypneic, mild dyspnea on 2 L [place], breath sounds clear-BUL & diminished-BLL  GI: soft, rounded-[redacted] weeks pregnant, non tender, bs x 4 Skin: no rashes/lesions noted Extremities: warm/dry, pulses + 2 R/P, no edema noted  Resolved Hospital  Problem list     Assessment & Plan:  Moderate LEFT pleural effusion secondary to suspected underlying pneumonia s/p thoracentesis Pleural fluid analysis missing protein & pH but per Light's Criteria is indicative of an exudative process. TNC elevated > 8,000 with CXR revealing potential underlying consolidation is suspicious for para pneumonic effusion. Currently receiving ceftriaxone for pyelonephritis - f/u Gram stain & cultures - f/u cytology - pulmonary toileting as tolerated - supplemental O2 PRN to maintain SpO2 >90%  Muscle spasms post Thoracentesis  Query nerve irritation post procedure? Patient describes this pain as different than earlier LEFT flank pain on admission. She describes pain as sharp/sore, unable to lift Left arm higher than shoulder level or lay on LEFT side. She reported coughing and feeling her "back lock up".  She reports dilaudid helps "for a little while" & morphine seems ineffective.  Discussed medication options with Pharmacist & Dr. [id number], on call for OB-gyn. Patient unable to tolerate PO medications without nausea/vomiting. Received approval from Dr. Logan Bores for short-term use of muscle relaxer and NSAID (NSAID safe sparingly up to 30 weeks of gestation per Dr. Logan Bores) - Methocarbamol 500 mg Q 8 for 4 doses - Ketorolac 15 mg Q 8 x 3 doses - continue morphine & dilaudid PRN - continue K-pad for comfort  Left pyelonephritis with left renal abscess Managed by urology & ID - continue ceftriaxone 2 g daily  Best Practice (right click and "Reselect all SmartList Selections" daily)  Diet/type: Regular consistency (see orders) DVT prophylaxis: SCD GI prophylaxis: N/A Lines: N/A Foley:  N/A Code Status:  full code Last date of multidisciplinary goals of care discussion [03/26/21]  Labs   CBC: Recent Labs  Lab 03/25/21 0906  WBC 9.4  HGB 10.2*  HCT 31.2*  MCV 89.1  PLT 408*    Basic Metabolic Panel: Recent Labs  Lab 03/25/21 0906 03/26/21 0651  NA  134*  --   K 3.2*  --   CL 100  --   CO2 24  --   GLUCOSE 77  --   BUN 7  --   CREATININE 0.57 0.60  CALCIUM 8.6*  --    GFR: Estimated Creatinine Clearance: 94.4 mL/min (by C-G formula based on SCr of 0.6 mg/dL). Recent Labs  Lab 03/25/21 0906 03/25/21 1332  PROCALCITON  --  0.16  WBC 9.4  --     Liver Function Tests: No results for input(s): AST, ALT, ALKPHOS, BILITOT, PROT, ALBUMIN in the last 168 hours. No results for input(s): LIPASE, AMYLASE in the last 168 hours. No results for input(s): AMMONIA in the last 168 hours.  ABG    Component Value Date/Time   PHART 7.45 03/26/2021 1800   PCO2ART 34 03/26/2021 1800   PO2ART 92 03/26/2021 1800   HCO3 23.6 03/26/2021 1800   ACIDBASEDEF 0.0 03/26/2021 1800   O2SAT 97.5 03/26/2021 1800     Coagulation Profile: Recent Labs  Lab 03/25/21 0906  INR 1.0    Cardiac Enzymes: No results for input(s): CKTOTAL, CKMB, CKMBINDEX, TROPONINI in the last 168 hours.  HbA1C: No results found for: HGBA1C  CBG: No results for input(s): GLUCAP in the last 168 hours.  Review of Systems: Positives in BOLD  Gen: Denies fever, chills, weight change, fatigue, night sweats HEENT: Denies blurred vision, double vision, hearing loss, tinnitus, sinus congestion, rhinorrhea, sore throat, neck stiffness, dysphagia PULM: Denies shortness of breath, cough, sputum production, hemoptysis, wheezing CV: Denies chest pain, edema, orthopnea, paroxysmal nocturnal dyspnea, palpitations GI: Denies abdominal pain, nausea, vomiting, diarrhea, hematochezia, melena, constipation, change in bowel habits GU: Denies dysuria, hematuria, polyuria, oliguria, urethral discharge Endocrine: Denies hot or cold intolerance, polyuria, polyphagia or appetite change Derm: Denies rash, dry skin, scaling or peeling skin change Heme: Denies easy bruising, bleeding, bleeding gums Neuro: Denies headache, numbness, weakness, slurred speech, loss of memory or  consciousness MUSK: pain & tenderness in LEFT shoulder, back & down LEFT side around thoracentesis site Past Medical History:  She,  has a past medical history of Bilateral kidney stones, Headache, History of kidney stones, Migraine, and Pyelonephritis (04/17/2016).   Surgical History:   Past Surgical History:  Procedure Laterality Date   COLPOSCOPY     CYSTOSCOPY WITH STENT PLACEMENT Left 02/26/2021   Procedure: CYSTOSCOPY WITH STENT PLACEMENT;  Surgeon: Crista ElliotBell, Eugene D III, MD;  Location: [facility] ORS;  Service: Urology;  Laterality: Left;   CYSTOSCOPY/RETROGRADE/URETEROSCOPY  03/15/2017   Procedure: CYSTOSCOPY/RETROGRADE/URETEROSCOPY;  Surgeon: Hildred LaserBudzyn, Brian James, MD;  Location: [facility] ORS;  Service: Urology;;   CYSTOSCOPY/URETEROSCOPY/HOLMIUM LASER/STENT PLACEMENT Left 03/06/2021   Procedure: CYSTOSCOPY/URETEROSCOPY/HOLMIUM LASER/STENT PLACEMENT;  Surgeon: Vanna ScotlandBrandon, Ashley, MD;  Location: [facility] ORS;  Service: Urology;  Laterality: Left;   LEEP       Social History:   reports that she has never smoked. She has never used smokeless tobacco. She reports that she does not drink alcohol and does not use drugs.   Family History:  Her family history includes Asthma in her daughter; Diabetes in her father and mother; Emphysema in her maternal grandmother; Heart defect in her half-sister; Hypertension in her father, mother, and paternal grandmother; Lung cancer in her maternal grandmother. There is  no history of Kidney cancer, Bladder Cancer, Breast cancer, Ovarian cancer, or Colon cancer.   Allergies No Known Allergies   Home Medications  Prior to Admission medications   Medication Sig Start Date End Date Taking? Authorizing Provider  acetaminophen (TYLENOL) 325 MG tablet Take 2 tablets (650 mg total) by mouth every 4 (four) hours as needed (for pain scale < 4  OR  temperature  >/=  100.5 F). 03/08/21   Hildred Laser, MD  cyclobenzaprine (FLEXERIL) 10 MG tablet Take 1 tablet (10 mg total) by mouth 3  (three) times daily as needed for muscle spasms. 03/23/21   Hildred Laser, MD  docusate sodium (COLACE) 100 MG capsule Take 1 capsule (100 mg total) by mouth 2 (two) times daily as needed for mild constipation. 03/08/21   Hildred Laser, MD  ferrous sulfate 325 (65 FE) MG tablet Take 1 tablet (325 mg total) by mouth 2 (two) times daily with a meal. 03/08/21   Hildred Laser, MD  ondansetron (ZOFRAN-ODT) 4 MG disintegrating tablet Take 1 tablet (4 mg total) by mouth every 8 (eight) hours as needed for nausea or vomiting. 03/08/21   Hildred Laser, MD  Prenatal Vit-Fe Fumarate-FA (MULTIVITAMIN-PRENATAL) 27-0.8 MG TABS tablet Take 1 tablet by mouth daily at 12 noon.    [provider]  promethazine (PHENERGAN) 12.5 MG suppository Place rectally. 03/20/21 03/27/21  [provider]     Critical care time: 40 minutes       Betsey Holiday, AGACNP-BC Acute Care Nurse Practitioner [facility]   [phone] / [phone] Please see Amion for pager details.

## 2021-03-27 ENCOUNTER — Telehealth: Payer: Self-pay | Admitting: Obstetrics and Gynecology

## 2021-03-27 ENCOUNTER — Inpatient Hospital Stay: Payer: Medicaid Other

## 2021-03-27 ENCOUNTER — Inpatient Hospital Stay
Admission: AD | Admit: 2021-03-27 | Discharge: 2021-03-27 | Disposition: A | Discharge status: Home / Self Care | Payer: Medicaid Other | Source: Ambulatory Visit | Attending: Critical Care Medicine | Admitting: Critical Care Medicine

## 2021-03-27 DIAGNOSIS — N151 Renal and perinephric abscess: Secondary | ICD-10-CM | POA: Diagnosis not present

## 2021-03-27 DIAGNOSIS — J90 Pleural effusion, not elsewhere classified: Secondary | ICD-10-CM | POA: Diagnosis not present

## 2021-03-27 DIAGNOSIS — Z3A25 25 weeks gestation of pregnancy: Secondary | ICD-10-CM | POA: Diagnosis not present

## 2021-03-27 DIAGNOSIS — O26832 Pregnancy related renal disease, second trimester: Secondary | ICD-10-CM

## 2021-03-27 DIAGNOSIS — R0602 Shortness of breath: Secondary | ICD-10-CM | POA: Diagnosis not present

## 2021-03-27 DIAGNOSIS — R0603 Acute respiratory distress: Secondary | ICD-10-CM

## 2021-03-27 DIAGNOSIS — N12 Tubulo-interstitial nephritis, not specified as acute or chronic: Secondary | ICD-10-CM | POA: Diagnosis not present

## 2021-03-27 DIAGNOSIS — O99891 Other specified diseases and conditions complicating pregnancy: Secondary | ICD-10-CM

## 2021-03-27 DIAGNOSIS — O99512 Diseases of the respiratory system complicating pregnancy, second trimester: Secondary | ICD-10-CM | POA: Diagnosis not present

## 2021-03-27 LAB — URINE DRUG SCREEN, QUALITATIVE (ARMC ONLY)
Amphetamines, Ur Screen: NOT DETECTED
Barbiturates, Ur Screen: NOT DETECTED
Benzodiazepine, Ur Scrn: NOT DETECTED
Cannabinoid 50 Ng, Ur ~~LOC~~: NOT DETECTED
Cocaine Metabolite,Ur ~~LOC~~: NOT DETECTED
MDMA (Ecstasy)Ur Screen: NOT DETECTED
Methadone Scn, Ur: NOT DETECTED
Opiate, Ur Screen: POSITIVE — AB
Phencyclidine (PCP) Ur S: NOT DETECTED
Tricyclic, Ur Screen: NOT DETECTED

## 2021-03-27 LAB — ECHOCARDIOGRAM COMPLETE
AR max vel: 1.82 cm2
AV Area VTI: 1.97 cm2
AV Area mean vel: 1.85 cm2
AV Mean grad: 3.5 mmHg
AV Peak grad: 6.4 mmHg
Ao pk vel: 1.27 m/s
Area-P 1/2: 3.79 cm2
Height: 63 in
S' Lateral: 3.16 cm
Weight: 2448 oz

## 2021-03-27 LAB — BRAIN NATRIURETIC PEPTIDE: B Natriuretic Peptide: 52.2 pg/mL (ref 0.0–100.0)

## 2021-03-27 LAB — MAGNESIUM: Magnesium: 1.7 mg/dL (ref 1.7–2.4)

## 2021-03-27 LAB — HEPATIC FUNCTION PANEL
ALT: 147 U/L — ABNORMAL HIGH (ref 0–44)
AST: 92 U/L — ABNORMAL HIGH (ref 15–41)
Albumin: 2.1 g/dL — ABNORMAL LOW (ref 3.5–5.0)
Alkaline Phosphatase: 106 U/L (ref 38–126)
Bilirubin, Direct: 0.6 mg/dL — ABNORMAL HIGH (ref 0.0–0.2)
Indirect Bilirubin: 0.6 mg/dL (ref 0.3–0.9)
Total Bilirubin: 1.2 mg/dL (ref 0.3–1.2)
Total Protein: 6.5 g/dL (ref 6.5–8.1)

## 2021-03-27 LAB — GLUCOSE, CAPILLARY: Glucose-Capillary: 94 mg/dL (ref 70–99)

## 2021-03-27 LAB — PROCALCITONIN
Procalcitonin: 0.29 ng/mL
Procalcitonin: 0.3 ng/mL

## 2021-03-27 LAB — CULTURE, URINE COMPREHENSIVE

## 2021-03-27 LAB — PHOSPHORUS: Phosphorus: 3.3 mg/dL (ref 2.5–4.6)

## 2021-03-27 MED ORDER — HYDROMORPHONE HCL 1 MG/ML IJ SOLN
0.5000 mg | Freq: Once | INTRAMUSCULAR | Status: AC
Start: 2021-03-27 — End: 2021-03-27
  Administered 2021-03-27: 0.5 mg via INTRAVENOUS
  Filled 2021-03-27: qty 1

## 2021-03-27 MED ORDER — OXYCODONE-ACETAMINOPHEN 5-325 MG PO TABS: 1.0000 | ORAL_TABLET | Freq: Four times a day (QID) | ORAL | Status: DC | PRN

## 2021-03-27 MED ORDER — DIAZEPAM 5 MG PO TABS: 5.0000 mg | ORAL_TABLET | Freq: Once | ORAL | Status: DC

## 2021-03-27 MED ORDER — KETOROLAC TROMETHAMINE 15 MG/ML IJ SOLN
15.0000 mg | Freq: Two times a day (BID) | INTRAMUSCULAR | Status: AC
  Administered 2021-03-27 (×2): 15 mg via INTRAVENOUS
  Filled 2021-03-27 (×2): qty 1

## 2021-03-27 MED ORDER — OXYCODONE HCL 5 MG PO TABS
5.0000 mg | ORAL_TABLET | Freq: Four times a day (QID) | ORAL | Status: DC | PRN
  Administered 2021-03-27 – 2021-03-28 (×4): 5 mg via ORAL
  Filled 2021-03-27 (×4): qty 1

## 2021-03-27 MED ORDER — MORPHINE SULFATE (PF) 2 MG/ML IV SOLN
2.0000 mg | Freq: Four times a day (QID) | INTRAVENOUS | Status: DC | PRN
  Administered 2021-03-27 – 2021-03-29 (×8): 2 mg via INTRAVENOUS
  Filled 2021-03-27 (×8): qty 1

## 2021-03-27 MED ORDER — DIAZEPAM 2 MG PO TABS
2.0000 mg | ORAL_TABLET | Freq: Once | ORAL | Status: AC
  Administered 2021-03-28: 2 mg via ORAL
  Filled 2021-03-27: qty 1

## 2021-03-27 NOTE — Progress Notes (Signed)
Discussed pain management regimen with OBGYN Dr. Logan Bores and transitioning the pt off of iv dilaudid.  Discussed transitioning pt to po's pain medication. Dr. Logan Bores stated po narcotics were safe to administer in pts [redacted] weeks pregnant.  Therefore, will place orders for po 5 mg oxycodone q6hrs; discontinue iv dilaudid; discontinue scheduled tylenol given elevated LFT's.  Will attempt to decrease frequency of iv morphine to 2 mg q6hrs prn.  Will continue to monitor and assess pt.   Camie Patience, AGNP  Pulmonary/Critical Care Pager [phone] (please enter 7 digits) PCCM Consult Pager [phone] (please enter 7 digits)

## 2021-03-27 NOTE — Progress Notes (Signed)
FHT doppler 150. Patient reports +FM, denies contractions, vaginal bleeding, and LOF.

## 2021-03-27 NOTE — Progress Notes (Signed)
Subjective:    Continues to have left side and left shoulder pain but "better than yesterday".  Still complains of shortness of breath when not on nasal cannula oxygen.  Objective:    Patient Vitals for the past 2 hrs:  BP Pulse Resp SpO2  03/27/21 0800 98/64 77 19 97 %   No intake/output data recorded.  Labs: Results for orders placed or performed during the hospital encounter of 03/24/21 (from the past 24 hour(s))  Lactate dehydrogenase (pleural or peritoneal fluid)     Status: Abnormal   Collection Time: 03/26/21 10:30 AM  Result Value Ref Range   LD, Fluid 162 (H) 3 - 23 U/L   Fluid Type-FLDH CYTO PLEU   Body fluid cell count with differential     Status: Abnormal   Collection Time: 03/26/21 10:30 AM  Result Value Ref Range   Fluid Type-FCT CYTO PLEU    Color, Fluid YELLOW YELLOW   Appearance, Fluid CLOUDY (A) CLEAR   Total Nucleated Cell Count, Fluid 8,143 cu mm   Neutrophil Count, Fluid 83 %   Lymphs, Fluid 10 %   Monocyte-Macrophage-Serous Fluid 7 %   Eos, Fluid 0 %  Amylase, pleural or peritoneal fluid        Status: None   Collection Time: 03/26/21 10:30 AM  Result Value Ref Range   Amylase, Fluid 26 U/L   Fluid Type-FAMY CYTO PLEU   Glucose, pleural or peritoneal fluid     Status: None   Collection Time: 03/26/21 10:30 AM  Result Value Ref Range   Glucose, Fluid 44 mg/dL   Fluid Type-FGLU CYTO PLEU   Lactate dehydrogenase     Status: None   Collection Time: 03/26/21  2:14 PM  Result Value Ref Range   LDH 132 98 - 192 U/L  Blood gas, arterial     Status: None   Collection Time: 03/26/21  6:00 PM  Result Value Ref Range   FIO2 0.28    pH, Arterial 7.45 7.350 - 7.450   pCO2 arterial 34 32.0 - 48.0 mmHg   pO2, Arterial 92 83.0 - 108.0 mmHg   Bicarbonate 23.6 20.0 - 28.0 mmol/L   Acid-base deficit 0.0 0.0 - 2.0 mmol/L   O2 Saturation 97.5 %   Patient temperature 37.0    Collection site RIGHT RADIAL    Sample type ARTERIAL DRAW    Allens test  (pass/fail) PASS PASS  MRSA Next Gen by PCR, Nasal     Status: None   Collection Time: 03/26/21 10:06 PM   Specimen: Nasal Mucosa; Nasal Swab  Result Value Ref Range   MRSA by PCR Next Gen NOT DETECTED NOT DETECTED    Medications    Current Discharge Medication List     CONTINUE these medications which have NOT CHANGED   Details  acetaminophen (TYLENOL) 325 MG tablet Take 2 tablets (650 mg total) by mouth every 4 (four) hours as needed (for pain scale < 4  OR  temperature  >/=  100.5 F). Qty: 60 tablet, Refills: 0    cyclobenzaprine (FLEXERIL) 10 MG tablet Take 1 tablet (10 mg total) by mouth 3 (three) times daily as needed for muscle spasms. Qty: 30 tablet, Refills: 2    docusate sodium (COLACE) 100 MG capsule Take 1 capsule (100 mg total) by mouth 2 (two) times daily as needed for mild constipation. Qty: 60 capsule, Refills: 1    ferrous sulfate 325 (65 FE) MG tablet Take 1 tablet (325 mg total)  by mouth 2 (two) times daily with a meal. Qty: 60 tablet, Refills: 3    ondansetron (ZOFRAN-ODT) 4 MG disintegrating tablet Take 1 tablet (4 mg total) by mouth every 8 (eight) hours as needed for nausea or vomiting. Qty: 60 tablet, Refills: 0    Prenatal Vit-Fe Fumarate-FA (MULTIVITAMIN-PRENATAL) 27-0.8 MG TABS tablet Take 1 tablet by mouth daily at 12 noon.    promethazine (PHENERGAN) 12.5 MG suppository Place rectally.       STOP taking these medications     HYDROmorphone (DILAUDID) 4 MG tablet Comments:  Reason for Stopping:            Assessment/Plan:    1.  25-week intrauterine pregnancy (complete fetal anatomy ultrasound as ordered)  2.  Renal abscess status post lithotripsy   Patient receiving intravenous antibiotics.  Reevaluate for possible IR drainage. 3.  Pleural effusion -status post thoracentesis -fluid has not reaccumulated. (? reactive from renal abscess?) 4.  Pain control has been quite difficult with her renal/flank pain and now with significant pain from  thoracentesis. 5.  Await pulmonary recommendation, await urology recommendation, await IR evaluation  (re-ultrasound today)   I spent 85 minutes involved in the care of this patient preparing to see the patient by obtaining and reviewing her medical history (including labs, imaging tests and prior procedures), documenting clinical information in the electronic health record (EHR), counseling and coordinating care plans, writing and sending prescriptions, ordering tests or procedures and in direct communicating with the patient and medical staff discussing pertinent items from her history and physical exam.  Elonda Husky, M.D. 03/27/2021 9:13 AM

## 2021-03-27 NOTE — Progress Notes (Signed)
PROGRESS NOTE  Consult progress note.  Laura Sosa  YQM:[id number] DOB: March 26, 1986 DOA: 03/24/2021 PCP: [place]   Brief Narrative: Taken from consult note. Laura R [place] is a 35 y.o. female with medical history significant for history of bilateral kidney stones, migraine headaches, pyelonephritis, G3P2002, [redacted] weeks pregnant, who was admitted to the hospital under obstetrics and gynecologic service for left flank pain, pyuria, hematuria, pain management and IV antibiotics.   She was recently treated for urinary tract infection and left renal abscess failing outpatient therapy with 9 doses of Rocephin outpatient.  She is status post left ureteral stent exchange on 03/06/2021 and subsequent stent removal on 03/08/2021.  Per ID she will need 3 to 4 weeks of antibiotics for this persistent infection.  Imaging without any new abscess.   Hospitalist service was consulted for shortness of breath, worse with exertion.  Found to have large left pleural effusion-thoracentesis with removal of 500 cc of exudative fluid, cultures pending.  Developed postthoracentesis left-sided pain and spasms along with some shortness of breath-transfer to stepdown overnight due to worsening pain and dyspnea.  Repeat chest x-ray with moderate left-sided pleural effusion, looks pretty much the same as before thoracentesis.  No pneumothorax.  Subjective: Patient was feeling little improved when seen today.  Discussed with patient about getting another thoracentesis or she might need a chest tube.  Patient would like to avoid if possible.  Assessment & Plan:   Principal Problem:   Shortness of breath Active Problems:   History of pyelonephritis   Hyperemesis gravidarum   Hyperemesis affecting pregnancy, antepartum   Pregnancy   Anemia of pregnancy in second trimester   Renal abscess   Pleural effusion  Exertional dyspnea.  Multifactorial with current pregnancy, dilutional anemia and left pleural  effusion.  Troponin, BNP within normal limit.  Procalcitonin at 0.16, CRP of 5.7 and ESR of 85 with history of recent left renal abscess and urologic procedures.  D-dimer was mildly elevated but CTA was negative for PE. Thoracentesis was done yesterday, initial labs with exudative fluid.  Cultures pending. Developed severe left-sided chest pain/spasms and dyspnea postprocedural requiring multiple doses of Dilaudid, morphine and muscle relaxant.  Overnight transferred to stepdown for worsening dyspnea.  Currently seems improving, remained on 2 L of oxygen. Echocardiogram with normal EF and grade 2 diastolic dysfunction. -Pulmonary on board now-appreciate their help -Follow-up thoracentesis cultures -Continue with incentive spirometry and flutter valve. -Continue with pain management  History of recent left renal abscess/pyelonephritis/nephrolithiasis.  Patient had recent lithotripsy done, also has a stent placement followed by removal due to worsening pain.  ID and urology is also following. -Further management per ID and urology. -Continue with ceftriaxone.  Fetal monitoring. -Per OB/GYN.  Objective: Vitals:   03/27/21 1100 03/27/21 1200 03/27/21 1300 03/27/21 1400  BP: (!) 96/57 94/64 (!) 99/59 102/68  Pulse: 84 75 84 84  Resp: (!) 23 (!) 21 (!) 22 19  Temp:      TempSrc:      SpO2: 96% 98% 97% 95%  Weight:      Height:        Intake/Output Summary (Last 24 hours) at 03/27/2021 1536 Last data filed at 03/27/2021 0601 Gross per 24 hour  Intake 257.58 ml  Output 1410 ml  Net -1152.42 ml    Filed Weights   03/24/21 2100  Weight: 69.4 kg    Examination:  General.  Well-developed lady, in no acute distress. Pulmonary.  Decreased breath sound at right base,  normal respiratory effort. CV.  Regular rate and rhythm, no JVD, rub or murmur. Abdomen.  Soft, nontender, nondistended, BS positive. CNS.  Alert and oriented x3.  No focal neurologic deficit. Extremities.  No edema, no  cyanosis, pulses intact and symmetrical. Psychiatry.  Judgment and insight appears normal.    All the records are reviewed and case discussed with Care Management/Social Worker. Management plans discussed with the patient, nursing and they are in agreement.   Procedures:  Thoracentesis  Antimicrobials:  Ceftriaxone  Data Reviewed: I have personally reviewed following labs and imaging studies  CBC: Recent Labs  Lab 03/25/21 0906  WBC 9.4  HGB 10.2*  HCT 31.2*  MCV 89.1  PLT 408*    Basic Metabolic Panel: Recent Labs  Lab 03/25/21 0906 03/26/21 0651 03/27/21 0953  NA 134*  --   --   K 3.2*  --   --   CL 100  --   --   CO2 24  --   --   GLUCOSE 77  --   --   BUN 7  --   --   CREATININE 0.57 0.60  --   CALCIUM 8.6*  --   --   MG  --   --  1.7  PHOS  --   --  3.3    GFR: Estimated Creatinine Clearance: 94.4 mL/min (by C-G formula based on SCr of 0.6 mg/dL). Liver Function Tests: Recent Labs  Lab 03/27/21 0953  AST 92*  ALT 147*  ALKPHOS 106  BILITOT 1.2  PROT 6.5  ALBUMIN 2.1*   No results for input(s): LIPASE, AMYLASE in the last 168 hours. No results for input(s): AMMONIA in the last 168 hours. Coagulation Profile: Recent Labs  Lab 03/25/21 0906  INR 1.0    Cardiac Enzymes: No results for input(s): CKTOTAL, CKMB, CKMBINDEX, TROPONINI in the last 168 hours. BNP (last 3 results) No results for input(s): PROBNP in the last 8760 hours. HbA1C: No results for input(s): HGBA1C in the last 72 hours. CBG: Recent Labs  Lab 03/26/21 2117  GLUCAP 94   Lipid Profile: No results for input(s): CHOL, HDL, LDLCALC, TRIG, CHOLHDL, LDLDIRECT in the last 72 hours. Thyroid Function Tests: Recent Labs    03/25/21 1332  TSH 0.962    Anemia Panel: Recent Labs    03/25/21 1332  VITAMINB12 237    Sepsis Labs: Recent Labs  Lab 03/25/21 1332 03/27/21 0953  PROCALCITON 0.16 0.29  0.30     Recent Results (from the past 240 hour(s))  Microscopic  Examination     Status: Abnormal   Collection Time: 03/24/21 12:59 PM   Urine  Result Value Ref Range Status   WBC, UA >30 (A) 0 - 5 /hpf Final   RBC 11-30 (A) 0 - 2 /hpf Final   Epithelial Cells (non renal) 0-10 0 - 10 /hpf Final   Casts Present (A) None seen /lpf Final   Cast Type Hyaline casts N/A Final   Mucus, UA Present (A) Not Estab. Final   Bacteria, UA Moderate (A) None seen/Few Final  CULTURE, URINE COMPREHENSIVE     Status: None (Preliminary result)   Collection Time: 03/24/21  1:50 PM   Specimen: Urine   UR  Result Value Ref Range Status   Urine Culture, Comprehensive Preliminary report  Preliminary   Organism ID, Bacteria Comment  Preliminary    Comment: No growth after 18-24 hours.  SARS CORONAVIRUS 2 (TAT 6-24 HRS) Nasopharyngeal Nasopharyngeal Swab  Status: None   Collection Time: 03/24/21  6:15 PM   Specimen: Nasopharyngeal Swab  Result Value Ref Range Status   SARS Coronavirus 2 NEGATIVE NEGATIVE Final    Comment: (NOTE) SARS-CoV-2 target nucleic acids are NOT DETECTED.  The SARS-CoV-2 RNA is generally detectable in upper and lower respiratory specimens during the acute phase of infection. Negative results do not preclude SARS-CoV-2 infection, do not rule out co-infections with other pathogens, and should not be used as the sole basis for treatment or other patient management decisions. Negative results must be combined with clinical observations, patient history, and epidemiological information. The expected result is Negative.  Fact Sheet for Patients: SugarRoll.be  Fact Sheet for Healthcare Providers: https://www.woods-mathews.com/  This test is not yet approved or cleared by the [place] FDA and  has been authorized for detection and/or diagnosis of SARS-CoV-2 by FDA under an Emergency Use Authorization (EUA). This EUA will remain  in effect (meaning this test can be used) for the duration of  the COVID-19 declaration under Se ction 564(b)(1) of the Act, 21 U.S.C. section 360bbb-3(b)(1), unless the authorization is terminated or revoked sooner.  Performed at [facility], [place] [address], [place], [place] [address]   Urine Culture     Status: Abnormal   Collection Time: 03/24/21 10:49 PM   Specimen: Urine, Clean Catch  Result Value Ref Range Status   Specimen Description   Final    URINE, CLEAN CATCH Performed at [facility], [address], [place], [place] [address]    Special Requests   Final    NONE Performed at [facility], [place], [place] [address]    Culture 20,000 COLONIES/mL YEAST (A)  Final   Report Status 03/26/2021 FINAL  Final  Culture, blood (routine x 2)     Status: None (Preliminary result)   Collection Time: 03/25/21  9:06 AM   Specimen: BLOOD RIGHT HAND  Result Value Ref Range Status   Specimen Description BLOOD RIGHT HAND  Final   Special Requests   Final    BOTTLES DRAWN AEROBIC AND ANAEROBIC Blood Culture adequate volume   Culture   Final    NO GROWTH 2 DAYS Performed at [facility], [address], [place], [place] [address]    Report Status PENDING  Incomplete  Culture, blood (routine x 2)     Status: None (Preliminary result)   Collection Time: 03/25/21  9:20 AM   Specimen: BLOOD LEFT HAND  Result Value Ref Range Status   Specimen Description BLOOD LEFT HAND  Final   Special Requests   Final    BOTTLES DRAWN AEROBIC AND ANAEROBIC Blood Culture adequate volume   Culture   Final    NO GROWTH 2 DAYS Performed at [facility], [address] [place], [place] [address]    Report Status PENDING  Incomplete  Body fluid culture w Gram Stain     Status: None (Preliminary result)   Collection Time: 03/26/21 10:30 AM   Specimen: PATH Cytology Pleural fluid  Result Value Ref Range Status   Specimen Description   Final    PLEURAL Performed at [facility], [address], [place], [place] [address]    Special Requests   Final    PLEURAL Performed at [facility], [address] [place], [place] [address]    Gram Stain PENDING  Incomplete   Culture   Final    NO GROWTH < 24 HOURS  Performed at [facility], [place] [address] [place], [place] [address]    Report Status PENDING  Incomplete  MRSA Next Gen by PCR, Nasal     Status: None   Collection Time: 03/26/21 10:06 PM   Specimen: Nasal Mucosa; Nasal Swab  Result Value Ref Range Status   MRSA by PCR Next Gen NOT DETECTED NOT DETECTED Final    Comment: (NOTE) The GeneXpert MRSA Assay (FDA approved for NASAL specimens only), is one component of a comprehensive MRSA colonization surveillance program. It is not intended to diagnose MRSA infection nor to guide or monitor treatment for MRSA infections. Test performance is not FDA approved in patients less than 89 years old. Performed at [facility], [address] [place], [place] [address]       Radiology Studies: CT Angio Chest Pulmonary Embolism (PE) W or WO Contrast  Result Date: 03/25/2021 CLINICAL DATA:  Chest pain or SOB, pleurisy or effusion suspected PE suspected, low/intermediate prob, positive D-dimer Pregnant patient at [redacted] weeks gestation EXAM: CT ANGIOGRAPHY CHEST WITH CONTRAST TECHNIQUE: Multidetector CT imaging of the chest was performed using the standard protocol during bolus administration of intravenous contrast. Multiplanar CT image reconstructions and MIPs were obtained to evaluate the vascular anatomy. CONTRAST:  49m OMNIPAQUE IOHEXOL 350 MG/ML SOLN COMPARISON:  CT angio 03/13/2021 FINDINGS: Cardiovascular: Technically limited exam due to contrast bolus timing and patient motion artifact. Majority of the contrast is within the venous structures in the aorta. Exam is diagnostic to the lobar level for pulmonary embolus. No intraluminal filling defects are seen. Mild cardiomegaly is again seen. Trace  pericardial effusion. Mediastinum/Nodes: Scattered small mediastinal lymph nodes not enlarged by size criteria. No convincing hilar adenopathy, left hilar evaluation is limited by adjacent airspace disease. Esophagus is mildly tortuous, minimal intraluminal fluid in the distal esophagus. Lungs/Pleura: Left pleural effusion is significantly increased from prior exam, now moderate to large. There is fluid tracking into the fissure. No convincing pleural enhancement or nodularity. There is adjacent compressive atelectasis in the left upper lobe. Near complete compressive atelectasis and or airspace disease in the left lower lobe. Other than minor dependent atelectasis, the right lung is clear. No findings of pulmonary edema. No right pleural effusion. Trace right pleural thickening/effusion. Upper Abdomen: No acute findings. Musculoskeletal: There are no acute or suspicious osseous abnormalities. Review of the MIP images confirms the above findings. IMPRESSION: 1. No central pulmonary embolus. Technically limited exam due to contrast bolus timing and patient motion artifact. 2. Moderate to large left pleural effusion, significantly increased from prior exam, now moderate to large in tracking into the fissure. No convincing pleural enhancement or nodularity. Adjacent compressive atelectasis. There is also subtotal atelectasis and/or airspace disease of the left lower lobe. 3. Mild cardiomegaly. Trace pericardial effusion. Electronically Signed   By: MKeith RakeM.D.   On: 03/25/2021 18:41   DG Chest Port 1 View  Result Date: 03/26/2021 CLINICAL DATA:  Left flank pain. EXAM: PORTABLE CHEST 1 VIEW COMPARISON:  Chest radiograph March 26, 2021. FINDINGS: Low lung volumes. Stable cardiac and mediastinal contours. Persistent moderate left pleural effusion. Underlying consolidation. No definite pneumothorax. IMPRESSION: Persistent moderate left pleural effusion with underlying consolidation. No pneumothorax.  Electronically Signed   By: DLovey NewcomerM.D.   On: 03/26/2021 18:10   DG Chest Port 1 View  Result Date: 03/26/2021 CLINICAL DATA:  Status post left thoracentesis EXAM: PORTABLE CHEST 1 VIEW COMPARISON:  03/25/2021 FINDINGS: Low lung volumes with central vascular congestion  and basilar atelectasis. Persistent lingula and left lower lobe collapse/consolidation. Small residual left effusion following thoracentesis. No pneumothorax. Trachea midline. Cardiac silhouette is obscured. IMPRESSION: Negative for pneumothorax following left thoracentesis. Electronically Signed   By: Jerilynn Mages.  Shick M.D.   On: 03/26/2021 10:50   ECHOCARDIOGRAM COMPLETE  Result Date: 03/27/2021    ECHOCARDIOGRAM REPORT   Patient Name:   Laura R [facility] Date of Exam: 03/27/2021 Medical Rec #:  [id number]          Height:       63.0 in Accession #:    [id number]         Weight:       153.0 lb Date of Birth:  1986-04-02          BSA:          1.726 m Patient Age:    34 years           BP:           94/64 mmHg Patient Gender: F                  HR:           75 bpm. Exam Location:  [facility] Procedure: 2D Echo, Cardiac Doppler and Color Doppler Indications:     Acute respiratory distress R06.03  History:         Patient has no prior history of Echocardiogram examinations.                  Migraines.  Sonographer:     Sherrie Sport RDCS (AE) Referring Phys:  [id number] [place] Diagnosing Phys: Neoma Laming MD  Sonographer Comments: Suboptimal apical window. IMPRESSIONS  1. Left ventricular ejection fraction, by estimation, is 50 to 55%. The left ventricle has low normal function. The left ventricle demonstrates regional wall motion abnormalities (see scoring diagram/findings for description). There is mild concentric left ventricular hypertrophy. Left ventricular diastolic parameters are consistent with Grade II diastolic dysfunction (pseudonormalization).  2. Right ventricular systolic function is normal. The right ventricular size is normal.  3. Left  atrial size was moderately dilated.  4. Right atrial size was moderately dilated.  5. The mitral valve is normal in structure. Mild mitral valve regurgitation. No evidence of mitral stenosis.  6. The aortic valve is normal in structure. Aortic valve regurgitation is not visualized. No aortic stenosis is present.  7. The inferior vena cava is normal in size with greater than 50% respiratory variability, suggesting right atrial pressure of 3 mmHg. FINDINGS  Left Ventricle: Left ventricular ejection fraction, by estimation, is 50 to 55%. The left ventricle has low normal function. The left ventricle demonstrates regional wall motion abnormalities. The left ventricular internal cavity size was normal in size. There is mild concentric left ventricular hypertrophy. Left ventricular diastolic parameters are consistent with Grade II diastolic dysfunction (pseudonormalization). Right Ventricle: The right ventricular size is normal. No increase in right ventricular wall thickness. Right ventricular systolic function is normal. Left Atrium: Left atrial size was moderately dilated. Right Atrium: Right atrial size was moderately dilated. Pericardium: There is no evidence of pericardial effusion. Mitral Valve: The mitral valve is normal in structure. Mild mitral valve regurgitation. No evidence of mitral valve stenosis. Tricuspid Valve: The tricuspid valve is normal in structure. Tricuspid valve regurgitation is mild . No evidence of tricuspid stenosis. Aortic Valve: The aortic valve is normal in structure. Aortic valve regurgitation is not visualized. No aortic stenosis is present. Aortic valve mean  gradient measures 3.5 mmHg. Aortic valve peak gradient measures 6.4 mmHg. Aortic valve area, by VTI measures 1.97 cm. Pulmonic Valve: The pulmonic valve was normal in structure. Pulmonic valve regurgitation is not visualized. No evidence of pulmonic stenosis. Aorta: The aortic root is normal in size and structure. Venous: The  inferior vena cava is normal in size with greater than 50% respiratory variability, suggesting right atrial pressure of 3 mmHg. IAS/Shunts: No atrial level shunt detected by color flow Doppler.  LEFT VENTRICLE PLAX 2D LVIDd:         4.34 cm  Diastology LVIDs:         3.16 cm  LV e' medial:    10.70 cm/s LV PW:         1.17 cm  LV E/e' medial:  10.1 LV IVS:        0.83 cm  LV e' lateral:   13.30 cm/s LVOT diam:     2.00 cm  LV E/e' lateral: 8.1 LV SV:         48 LV SV Index:   28 LVOT Area:     3.14 cm  RIGHT VENTRICLE RV Basal diam:  3.98 cm RV S prime:     14.40 cm/s TAPSE (M-mode): 3.6 cm LEFT ATRIUM             Index       RIGHT ATRIUM           Index LA diam:        2.60 cm 1.51 cm/m  RA Area:     27.30 cm LA Vol (A2C):   79.3 ml 45.96 ml/m RA Volume:   101.00 ml 58.53 ml/m LA Vol (A4C):   75.8 ml 43.93 ml/m LA Biplane Vol: 82.7 ml 47.93 ml/m  AORTIC VALVE                   PULMONIC VALVE AV Area (Vmax):    1.82 cm    PV Vmax:        0.85 m/s AV Area (Vmean):   1.85 cm    PV Peak grad:   2.9 mmHg AV Area (VTI):     1.97 cm    RVOT Peak grad: 4 mmHg AV Vmax:           126.50 cm/s AV Vmean:          90.400 cm/s AV VTI:            0.245 m AV Peak Grad:      6.4 mmHg AV Mean Grad:      3.5 mmHg LVOT Vmax:         73.10 cm/s LVOT Vmean:        53.100 cm/s LVOT VTI:          0.154 m LVOT/AV VTI ratio: 0.63  AORTA Ao Root diam: 2.70 cm MITRAL VALVE                TRICUSPID VALVE MV Area (PHT): 3.79 cm     TR Peak grad:   23.8 mmHg MV Decel Time: 200 msec     TR Vmax:        244.00 cm/s MV E velocity: 108.00 cm/s MV A velocity: 60.40 cm/s   SHUNTS MV E/A ratio:  1.79         Systemic VTI:  0.15 m  Systemic Diam: 2.00 cm Neoma Laming MD Electronically signed by Neoma Laming MD Signature Date/Time: 03/27/2021/3:33:00 PM    Final    US THORACENTESIS ASP PLEURAL SPACE W/IMG GUIDE  Result Date: 03/26/2021 INDICATION: Shortness of breath, chest pain, left pleural effusion EXAM:  ULTRASOUND GUIDED LEFT THORACENTESIS MEDICATIONS: 1% lidocaine local COMPLICATIONS: None immediate. PROCEDURE: An ultrasound guided thoracentesis was thoroughly discussed with the patient and questions answered. The benefits, risks, alternatives and complications were also discussed. The patient understands and wishes to proceed with the procedure. Written consent was obtained. Ultrasound was performed to localize and mark an adequate pocket of fluid in the left chest. The area was then prepped and draped in the normal sterile fashion. 1% Lidocaine was used for local anesthesia. Under ultrasound guidance a 6 Fr Safe-T-Centesis catheter was introduced. Thoracentesis was performed. The catheter was removed and a dressing applied. FINDINGS: A total of approximately 575 cc of serosanguineous pleural fluid was removed. Samples were sent to the laboratory as requested by the clinical team. IMPRESSION: Successful ultrasound guided left thoracentesis yielding 575 cc of pleural fluid. Electronically Signed   By: Jerilynn Mages.  Shick M.D.   On: 03/26/2021 10:48    Scheduled Meds:  Chlorhexidine Gluconate Cloth  6 each Topical Daily   ketorolac  15 mg Intravenous BID   prenatal multivitamin  1 tablet Oral Q1200   Continuous Infusions:  sodium chloride Stopped (03/26/21 2101)   cefTRIAXone (ROCEPHIN)  IV 2 g (03/27/21 0933)   methocarbamol (ROBAXIN) IV 500 mg (03/27/21 1444)     LOS: 2 days   Time spent: 36 minutes. More than 50% of the time was spent in counseling/coordination of care  Lorella Nimrod, MD [facility]  If 7PM-7AM, please contact night-coverage [email or web address]  03/27/2021, 3:36 PM   This record has been created using Systems analyst. Errors have been sought and corrected,but may not always be located. Such creation errors do not reflect on the standard of care.

## 2021-03-27 NOTE — Progress Notes (Signed)
Urology Consult Follow Up  Subjective: Patient still experiencing left shoulder and diaphoretic pain.  VSS afebrile.  UOP Good.  No complaints of dysuria, gross heme or suprapubic pain.   WBC count normal.  Serum creatinine normal. Pleural fluid analysis indicative of exudative effusion.  Anti-infectives: Anti-infectives (From admission, onward)    Start     Dose/Rate Route Frequency Ordered Stop   03/25/21 2200  Vancomycin (VANCOCIN) 1,250 mg in sodium chloride 0.9 % 250 mL IVPB  Status:  Discontinued       Note to Pharmacy: Please adjust dose accordingly. [redacted] weeks pregnant. Labs are ordered. Thank you   1,250 mg 166.7 mL/hr over 90 Minutes Intravenous Every 12 hours 03/25/21 0828 03/25/21 1139   03/25/21 1000  Vancomycin (VANCOCIN) 1,500 mg in sodium chloride 0.9 % 500 mL IVPB       Note to Pharmacy: Please adjust dose accordingly. [redacted] weeks pregnant. Labs are ordered. Thank you   1,500 mg 250 mL/hr over 120 Minutes Intravenous  Once 03/25/21 0825 03/25/21 1118   03/25/21 0900  cefTRIAXone (ROCEPHIN) 2 g in sodium chloride 0.9 % 100 mL IVPB        2 g 200 mL/hr over 30 Minutes Intravenous Every 24 hours 03/25/21 0805     03/25/21 0900  vancomycin (VANCOCIN) IVPB 1000 mg/200 mL premix  Status:  Discontinued       Note to Pharmacy: Please adjust dose accordingly. [redacted] weeks pregnant. Labs are ordered. Thank you   1,000 mg 200 mL/hr over 60 Minutes Intravenous Every 12 hours 03/25/21 0805 03/25/21 0825       Current Facility-Administered Medications  Medication Dose Route Frequency Provider Last Rate Last Admin   0.9 %  sodium chloride infusion   Intravenous Continuous Arnetha CourserAmin, Sumayya, MD 75 mL/hr at 03/26/21 2300 New Bag at 03/26/21 2300   0.9 %  sodium chloride infusion   Intravenous PRN Linzie CollinEvans, David James, MD   Stopped at 03/26/21 2101   acetaminophen (OFIRMEV) IV 1,000 mg  1,000 mg Intravenous Q6H Otelia SergeantBelue, Nathan S, RPH 400 mL/hr at 03/27/21 0739 1,000 mg at 03/27/21 0739   calcium  carbonate (TUMS - dosed in mg elemental calcium) chewable tablet 400 mg of elemental calcium  2 tablet Oral Q4H PRN Arnetha CourserAmin, Sumayya, MD       cefTRIAXone (ROCEPHIN) 2 g in sodium chloride 0.9 % 100 mL IVPB  2 g Intravenous Q24H Arnetha CourserAmin, Sumayya, MD 200 mL/hr at 03/26/21 0932 2 g at 03/26/21 0932   Chlorhexidine Gluconate Cloth 2 % PADS 6 each  6 each Topical Daily Linzie CollinEvans, David James, MD       HYDROmorphone (DILAUDID) injection 0.5 mg  0.5 mg Intravenous Q3H PRN Arnetha CourserAmin, Sumayya, MD   0.5 mg at 03/27/21 16100626   hydrOXYzine (ATARAX/VISTARIL) tablet 25 mg  25 mg Oral TID PRN Arnetha CourserAmin, Sumayya, MD   25 mg at 03/26/21 1829   ketorolac (TORADOL) 15 MG/ML injection 15 mg  15 mg Intravenous BID Rust-Chester, Cecelia ByarsBritton L, NP       methocarbamol (ROBAXIN) 500 mg in dextrose 5 % 50 mL IVPB  500 mg Intravenous Q8H Rust-Chester, Britton L, NP 100 mL/hr at 03/27/21 0604 500 mg at 03/27/21 0604   morphine 2 MG/ML injection 2 mg  2 mg Intravenous Q3H PRN Arnetha CourserAmin, Sumayya, MD   2 mg at 03/27/21 0510   ondansetron (ZOFRAN) injection 4 mg  4 mg Intravenous Q6H PRN Arnetha CourserAmin, Sumayya, MD   4 mg at 03/26/21 0708   prenatal  multivitamin tablet 1 tablet  1 tablet Oral Q1200 Arnetha Courser, MD       promethazine (PHENERGAN) suppository 12.5 mg  12.5 mg Rectal Q8H PRN Arnetha Courser, MD   12.5 mg at 03/25/21 1618   zolpidem (AMBIEN) tablet 5 mg  5 mg Oral QHS PRN Arnetha Courser, MD         Objective: Vital signs in last 24 hours: Temp:  [97.3 F (36.3 C)-98.3 F (36.8 C)] 98.3 F (36.8 C) (08/01 0400) Pulse Rate:  [65-102] 78 (08/01 0700) Resp:  [14-41] 23 (08/01 0700) BP: (91-120)/(46-74) 91/46 (08/01 0700) SpO2:  [90 %-99 %] 96 % (08/01 0700)  Intake/Output from previous day: 07/31 0701 - 08/01 0700 In: 782.2 [I.V.:432.2; IV Piggyback:350] Out: 1410 [Urine:1410] Intake/Output this shift: No intake/output data recorded.   Physical Exam Constitutional:  Well nourished. Alert and oriented, No acute distress. HEENT: [place] AT, moist  mucus membranes.  Trachea midline, no masses. Cardiovascular: No clubbing, cyanosis, or edema. Respiratory: Normal respiratory effort, increased of breathing.  Nasal canula in place.  Neurologic: Grossly intact, no focal deficits, moving all 4 extremities. Psychiatric: Normal mood and affect.    Lab Results:  Recent Labs    03/25/21 0906  WBC 9.4  HGB 10.2*  HCT 31.2*  PLT 408*   BMET Recent Labs    03/25/21 0906 03/26/21 0651  NA 134*  --   K 3.2*  --   CL 100  --   CO2 24  --   GLUCOSE 77  --   BUN 7  --   CREATININE 0.57 0.60  CALCIUM 8.6*  --    PT/INR Recent Labs    03/25/21 0906  LABPROT 13.2  INR 1.0   ABG Recent Labs    03/26/21 1800  PHART 7.45  HCO3 23.6    Studies/Results: CT Angio Chest Pulmonary Embolism (PE) W or WO Contrast  Result Date: 03/25/2021 CLINICAL DATA:  Chest pain or SOB, pleurisy or effusion suspected PE suspected, low/intermediate prob, positive D-dimer Pregnant patient at [redacted] weeks gestation EXAM: CT ANGIOGRAPHY CHEST WITH CONTRAST TECHNIQUE: Multidetector CT imaging of the chest was performed using the standard protocol during bolus administration of intravenous contrast. Multiplanar CT image reconstructions and MIPs were obtained to evaluate the vascular anatomy. CONTRAST:  48mL OMNIPAQUE IOHEXOL 350 MG/ML SOLN COMPARISON:  CT angio 03/13/2021 FINDINGS: Cardiovascular: Technically limited exam due to contrast bolus timing and patient motion artifact. Majority of the contrast is within the venous structures in the aorta. Exam is diagnostic to the lobar level for pulmonary embolus. No intraluminal filling defects are seen. Mild cardiomegaly is again seen. Trace pericardial effusion. Mediastinum/Nodes: Scattered small mediastinal lymph nodes not enlarged by size criteria. No convincing hilar adenopathy, left hilar evaluation is limited by adjacent airspace disease. Esophagus is mildly tortuous, minimal intraluminal fluid in the distal  esophagus. Lungs/Pleura: Left pleural effusion is significantly increased from prior exam, now moderate to large. There is fluid tracking into the fissure. No convincing pleural enhancement or nodularity. There is adjacent compressive atelectasis in the left upper lobe. Near complete compressive atelectasis and or airspace disease in the left lower lobe. Other than minor dependent atelectasis, the right lung is clear. No findings of pulmonary edema. No right pleural effusion. Trace right pleural thickening/effusion. Upper Abdomen: No acute findings. Musculoskeletal: There are no acute or suspicious osseous abnormalities. Review of the MIP images confirms the above findings. IMPRESSION: 1. No central pulmonary embolus. Technically limited exam due to contrast bolus timing  and patient motion artifact. 2. Moderate to large left pleural effusion, significantly increased from prior exam, now moderate to large in tracking into the fissure. No convincing pleural enhancement or nodularity. Adjacent compressive atelectasis. There is also subtotal atelectasis and/or airspace disease of the left lower lobe. 3. Mild cardiomegaly. Trace pericardial effusion. Electronically Signed   By: Narda Rutherford M.D.   On: 03/25/2021 18:41   US RENAL  Result Date: 03/25/2021 CLINICAL DATA:  Left pyelonephritis complicated by renal abscess, [redacted] weeks pregnant, flank pain EXAM: RENAL / URINARY TRACT ULTRASOUND COMPLETE COMPARISON:  03/24/2021, 03/13/2021 FINDINGS: Right Kidney: Renal measurements: 12.2 x 4.7 x 5.1 cm = volume: 152 mL. Echogenicity within normal limits. No mass or hydronephrosis visualized. Left Kidney: Renal measurements: 10.7 x 6.3 x 5.4 cm = volume: 193 mL. Normal echogenicity and cortical thickness. Trace pelviectasis without significant obstruction or hydronephrosis. Upper pole intrarenal isoechoic abnormality measures 3.7 x 3.6 x 2.2 cm compatible with an evolving renal abscess without significant cystic or fluid  component. Previous upper pole exophytic hypoechoic fluid collection appears to have dispersed into the perinephric and retroperitoneal space and is no longer well-circumscribed by ultrasound. Reactive left effusion noted. Bladder: Appears normal for degree of bladder distention. Other: None. IMPRESSION: 3.7 cm left upper pole intrarenal isoechoic collection is compatible with evolving renal abscess without significant fluid component. Previous adjacent left upper pole exophytic hypoechoic fluid collection appears to have dispersed into the perinephric and retroperitoneal space when compared to the MRI from yesterday. No hydronephrosis. These results were called by telephone at the time of interpretation on 03/25/2021 at 9:09 am to provider EUGENE BELL III , who verbally acknowledged these results. Electronically Signed   By: Judie Petit.  Shick M.D.   On: 03/25/2021 09:10   DG Chest Port 1 View  Result Date: 03/26/2021 CLINICAL DATA:  Left flank pain. EXAM: PORTABLE CHEST 1 VIEW COMPARISON:  Chest radiograph March 26, 2021. FINDINGS: Low lung volumes. Stable cardiac and mediastinal contours. Persistent moderate left pleural effusion. Underlying consolidation. No definite pneumothorax. IMPRESSION: Persistent moderate left pleural effusion with underlying consolidation. No pneumothorax. Electronically Signed   By: Annia Belt M.D.   On: 03/26/2021 18:10   DG Chest Port 1 View  Result Date: 03/26/2021 CLINICAL DATA:  Status post left thoracentesis EXAM: PORTABLE CHEST 1 VIEW COMPARISON:  03/25/2021 FINDINGS: Low lung volumes with central vascular congestion and basilar atelectasis. Persistent lingula and left lower lobe collapse/consolidation. Small residual left effusion following thoracentesis. No pneumothorax. Trachea midline. Cardiac silhouette is obscured. IMPRESSION: Negative for pneumothorax following left thoracentesis. Electronically Signed   By: Judie Petit.  Shick M.D.   On: 03/26/2021 10:50   US THORACENTESIS ASP  PLEURAL SPACE W/IMG GUIDE  Result Date: 03/26/2021 INDICATION: Shortness of breath, chest pain, left pleural effusion EXAM: ULTRASOUND GUIDED LEFT THORACENTESIS MEDICATIONS: 1% lidocaine local COMPLICATIONS: None immediate. PROCEDURE: An ultrasound guided thoracentesis was thoroughly discussed with the patient and questions answered. The benefits, risks, alternatives and complications were also discussed. The patient understands and wishes to proceed with the procedure. Written consent was obtained. Ultrasound was performed to localize and mark an adequate pocket of fluid in the left chest. The area was then prepped and draped in the normal sterile fashion. 1% Lidocaine was used for local anesthesia. Under ultrasound guidance a 6 Fr Safe-T-Centesis catheter was introduced. Thoracentesis was performed. The catheter was removed and a dressing applied. FINDINGS: A total of approximately 575 cc of serosanguineous pleural fluid was removed. Samples were sent to the laboratory  as requested by the clinical team. IMPRESSION: Successful ultrasound guided left thoracentesis yielding 575 cc of pleural fluid. Electronically Signed   By: Judie Petit.  Shick M.D.   On: 03/26/2021 10:48     Assessment and Plan: 35 year old female who is [redacted] weeks pregnant who underwent left ureteroscopy, laser lithotripsy and left ureteral stent exchange on March 06, 2021 with stent being removed by tether on 03/08/2021 at bedside who was admitted on March 24, 2021 for continuing left flank pain, pyuria and hematuria after treating with 9 doses of IM Rocephin on an outpatient basis.  MR suspicious for pyelonephritis with multiple renal abscesses.   Folow up RUS noted a 3.7 cm left upper pole intrarenal isoechoic collection compatible with evolving abscess.  IR was consulted and it was felt that drain placement would be difficult without the use of CT imaging and it was decided given her stable status to continue observation with antibiotics.  CTA of the  chest performed to rule out PE noted a moderate to large left pleural effusion for which she underwent thoracentesis yesterday and 575 cc of fluid was drained from her chest.  Transferred to ICU yesterday for concerns of respiratory distress in the setting of poor pain control and pleural effusion.    Recommendations: -continue ceftriaxone 2 gram IV - will need 3 to 4 weeks of antibiotic for persistent left renal abscesses -will likely need to be sent home with IV antibiotics as she is intolerable of oral medications due to her hyperemesis gravidarum -Continue to trend serum creatinine -Continue to trend CBC -Continue to follow blood and pleural fluid cultures -Continue to observe clinically, but consider repeat imaging if clinical condition worsens or does not improve over the course of the next 3 to 5 days    LOS: 2 days    [facility] 03/27/2021

## 2021-03-27 NOTE — Telephone Encounter (Signed)
WIC office called stating that the RX that was sent in for boost can not be completed as she is not on [facility] currently- they are asking you to contact pt to make let her know she needs to call the [facility] office to get certified.

## 2021-03-27 NOTE — Progress Notes (Signed)
*  PRELIMINARY RESULTS* Echocardiogram 2D Echocardiogram has been performed.  Cristela Blue 03/27/2021, 1:45 PM

## 2021-03-27 NOTE — Progress Notes (Signed)
ID PROGRESS NOTE  ID:34yo F [redacted]wk gestation, with recent L renal abscess/pyelonpehritis/nephrolithiasis c/b L pleural effusion s/p thoracentesis  Afebrile Day 3 of ceftriaxone 2gm IV daily  A/P: plan for 3-4 wk of iv ceftriaxone given refractory nausea and vomiting from pregnancy Discuss with urology utility of repeat imaging to see if any renal abscess needs to be drain. IR did not feel that fluid collection could be drained at this time per previous note.  Dr Rivka Safer to see patient tomorrow  Aram Beecham B. Drue Second MD MPH [facility] [phone]

## 2021-03-27 NOTE — Progress Notes (Signed)
NAME:  Laura Sosa, MRN:  [id number], DOB:  07/15/1986, LOS: 2 ADMISSION DATE:  03/24/2021, CONSULTATION DATE:  03/26/2021 REFERRING MD:  Dr. Toniann FailKakrakandy, CHIEF COMPLAINT:   LEFT flank pain  History of Present Illness:  35 yo F, who is [redacted] weeks pregnant, presented to [facility] for admission on 03/24/21 by OB-gyn at urology's request to assist with pain management and further evaluation of possible abscess & antibiotic coverage in the setting of continued LEFT flank pain/ pyuria/ hematuria. The patient underwent ureteroscopy/cystoscopy & laser management of ureteral calculi with subsequent stent placement by Dr. Apolinar JunesBrandon on 03/06/21. Patient received 9 doses of ceftriaxone outpatient without symptom relief.  Hospital course: Urology is consulted for management of ureteral abscess with OB-gyn monitoring fetal heart rate & fetal well being.  MRI without contrast 03/24/2021 >>revealed multiple left renal lesions measuring up to 4 cm similar to previous ultrasound with associated perinephric soft tissue stranding suspicious for pyelonephritis and multiple renal abscesses. Left pleural effusion. Renal ultrasound 03/25/2021>> revealed a 3.7 cm left upper pole intrarenal isoechoic collection compatible with evolving abscess.  Per urology's assessment the ultrasound also showed that previous adjacent left upper pole hypoechoic fluid collection appears to have dispersed into the perinephric and retroperitoneal space when compared to the MRI.  Negative for hydronephrosis. Dr. Denny LevySchick with interventional radiology was consulted and there is no collection that is amenable to aspiration.  Dr. Renold DonVu with infectious disease was consulted and recommended to continue ceftriaxone IV 2 g daily.    TRH service was consulted on 03/25/2021 due to complaints of shortness of breath with exertion and movement.  Patient was worked up for pulmonary embolus.   CT angio chest 03/25/2021 >> negative for PE, but does show moderate to large left  pleural effusion significantly increased from prior exam with adjacent compressive atelectasis.  Mild cardiomegaly and trace pericardial effusion.  On 03/26/2021 Dr. Denny LevySchick with IR performed a left thoracentesis and removed 575 mL of pleural fluid.   Postthoracentesis patient reported continued shortness of breath with tachypnea as well as pain in her left shoulder refractory to IV Dilaudid and morphine. CXR 03/26/21>> (POST- thoracentesis) persistent moderate left pleural effusion with possible underlying consolidation versus atelectasis.  No pneumothorax. Due to the patient's ongoing tachypnea, uncontrolled pain and concerns for respiratory distress patient was transferred to stepdown unit with PCCM consulted for further management and monitoring.  Pertinent  Medical History  Bilateral kidney stones Pyelonephritis  Significant Hospital Events: Including procedures, antibiotic start and stop dates in addition to other pertinent events   03/24/21- admit to Mother/baby by OB-gyn for pain control and IV antibiotics in the setting of suspected pyelonephritis with possible abscess 03/25/21- Urology & ID consulted.  Recommendations for Rocephin coverage, IR consulted but no collection amenable to aspiration.  TRH consulted for shortness of breath, work-up for PE negative but did reveal large pleural effusion. 03/26/21-IR consulted and left thoracentesis performed removing 575 mL of pleural fluid.  Later in evening transferred to ICU with concerns of respiratory distress in the setting of poor pain control and pleural effusion.  PCCM consulted. 03/27/21- patient improved clinically smiling during evaluation. Complete US abdomen today.  Patient requesting frequent IV narcotics  Interim History / Subjective: Labs/ Imaging personally reviewed > see descriptions of CT/ultrasound/CT angio/chest x-ray above in HPI.  Postthoracentesis patient reports increased muscular pain on left side.  She reports coughing and  feeling as though her "entire back locked up", she is unable to raise her arm over her head  due to shoulder pain, and cannot lay on her left side.  This pain is reproducible & 10/10, being poorly controlled with dilaudid & morphine. Patient states rest helps somewhat, but does feels sharp spasms intermittently and with exertion. Patient is tachypneic but able to speak in complete sentences. SpO2 has remained stable on 2 L [place].  - Pleural fluid analysis indicative of an exudative effusion, possibly para pneumonic. No pleural protein or pH to review, cytology and gram stain pending.  Objective   Blood pressure 98/64, pulse 77, temperature 98.3 F (36.8 C), resp. rate 19, height 5\' 3"  (1.6 m), weight 69.4 kg, SpO2 97 %.        Intake/Output Summary (Last 24 hours) at 03/27/2021 0915 Last data filed at 03/27/2021 0601 Gross per 24 hour  Intake 782.2 ml  Output 1410 ml  Net -627.8 ml    Filed Weights   03/24/21 2100  Weight: 69.4 kg    Examination: General: Adult female, acutely ill, lying in bed, NAD HEENT: MM pink/moist, anicteric, atraumatic, neck supple Neuro: A&O x 4, follows commands, PERRL +3, MAE CV: s1s2 RRR, NSR on monitor, no r/m/g Pulm: Regular, tachypneic, mild dyspnea on 2 L [place], breath sounds clear-BUL & diminished-BLL  GI: soft, rounded-[redacted] weeks pregnant, non tender, bs x 4 Skin: no rashes/lesions noted Extremities: warm/dry, pulses + 2 R/P, no edema noted  Resolved Hospital Problem list     Assessment & Plan:  Moderate LEFT pleural effusion secondary to suspected underlying pneumonia s/p thoracentesis Pleural fluid analysis missing protein & pH but per Light's Criteria is indicative of an exudative process. TNC elevated > 8,000 with CXR revealing potential underlying consolidation is suspicious for para pneumonic effusion. Currently receiving ceftriaxone for pyelonephritis - f/u Gram stain & cultures - f/u cytology - pulmonary toileting as tolerated - supplemental O2  PRN to maintain SpO2 >90%  Muscle spasms post Thoracentesis  Query nerve irritation post procedure? Patient describes this pain as different than earlier LEFT flank pain on admission. She describes pain as sharp/sore, unable to lift Left arm higher than shoulder level or lay on LEFT side. She reported coughing and feeling her "back lock up".  She reports dilaudid helps "for a little while" & morphine seems ineffective.  -reduce narcotics  -2101 abd -empiric abx - TTE -BNP -trial of lasix to conisder- patient had hypotensive episodes from renal infection   Left pyelonephritis with left renal abscess Managed by urology & ID - continue ceftriaxone 2 g daily  Best Practice (right click and "Reselect all SmartList Selections" daily)  Diet/type: Regular consistency (see orders) DVT prophylaxis: SCD GI prophylaxis: N/A Lines: N/A Foley:  N/A Code Status:  full code Last date of multidisciplinary goals of care discussion [03/26/21]  Labs   CBC: Recent Labs  Lab 03/25/21 0906  WBC 9.4  HGB 10.2*  HCT 31.2*  MCV 89.1  PLT 408*     Basic Metabolic Panel: Recent Labs  Lab 03/25/21 0906 03/26/21 0651  NA 134*  --   K 3.2*  --   CL 100  --   CO2 24  --   GLUCOSE 77  --   BUN 7  --   CREATININE 0.57 0.60  CALCIUM 8.6*  --     GFR: Estimated Creatinine Clearance: 94.4 mL/min (by C-G formula based on SCr of 0.6 mg/dL). Recent Labs  Lab 03/25/21 0906 03/25/21 1332  PROCALCITON  --  0.16  WBC 9.4  --      Liver Function  Tests: No results for input(s): AST, ALT, ALKPHOS, BILITOT, PROT, ALBUMIN in the last 168 hours. No results for input(s): LIPASE, AMYLASE in the last 168 hours. No results for input(s): AMMONIA in the last 168 hours.  ABG    Component Value Date/Time   PHART 7.45 03/26/2021 1800   PCO2ART 34 03/26/2021 1800   PO2ART 92 03/26/2021 1800   HCO3 23.6 03/26/2021 1800   ACIDBASEDEF 0.0 03/26/2021 1800   O2SAT 97.5 03/26/2021 1800      Coagulation  Profile: Recent Labs  Lab 03/25/21 0906  INR 1.0     Cardiac Enzymes: No results for input(s): CKTOTAL, CKMB, CKMBINDEX, TROPONINI in the last 168 hours.  HbA1C: No results found for: HGBA1C  CBG: No results for input(s): GLUCAP in the last 168 hours.  Review of Systems: Positives in BOLD  Gen: Denies fever, chills, weight change, fatigue, night sweats HEENT: Denies blurred vision, double vision, hearing loss, tinnitus, sinus congestion, rhinorrhea, sore throat, neck stiffness, dysphagia PULM: Denies shortness of breath, cough, sputum production, hemoptysis, wheezing CV: Denies chest pain, edema, orthopnea, paroxysmal nocturnal dyspnea, palpitations GI: Denies abdominal pain, nausea, vomiting, diarrhea, hematochezia, melena, constipation, change in bowel habits GU: Denies dysuria, hematuria, polyuria, oliguria, urethral discharge Endocrine: Denies hot or cold intolerance, polyuria, polyphagia or appetite change Derm: Denies rash, dry skin, scaling or peeling skin change Heme: Denies easy bruising, bleeding, bleeding gums Neuro: Denies headache, numbness, weakness, slurred speech, loss of memory or consciousness MUSK: pain & tenderness in LEFT shoulder, back & down LEFT side around thoracentesis site Past Medical History:  She,  has a past medical history of Bilateral kidney stones, Headache, History of kidney stones, Migraine, and Pyelonephritis (04/17/2016).   Surgical History:   Past Surgical History:  Procedure Laterality Date   COLPOSCOPY     CYSTOSCOPY WITH STENT PLACEMENT Left 02/26/2021   Procedure: CYSTOSCOPY WITH STENT PLACEMENT;  Surgeon: Crista Elliot, MD;  Location: [facility] ORS;  Service: Urology;  Laterality: Left;   CYSTOSCOPY/RETROGRADE/URETEROSCOPY  03/15/2017   Procedure: CYSTOSCOPY/RETROGRADE/URETEROSCOPY;  Surgeon: Hildred Laser, MD;  Location: [facility] ORS;  Service: Urology;;   CYSTOSCOPY/URETEROSCOPY/HOLMIUM LASER/STENT PLACEMENT Left 03/06/2021    Procedure: CYSTOSCOPY/URETEROSCOPY/HOLMIUM LASER/STENT PLACEMENT;  Surgeon: Vanna Scotland, MD;  Location: [facility] ORS;  Service: Urology;  Laterality: Left;   LEEP       Social History:   reports that she has never smoked. She has never used smokeless tobacco. She reports that she does not drink alcohol and does not use drugs.   Family History:  Her family history includes Asthma in her daughter; Diabetes in her father and mother; Emphysema in her maternal grandmother; Heart defect in her half-sister; Hypertension in her father, mother, and paternal grandmother; Lung cancer in her maternal grandmother. There is no history of Kidney cancer, Bladder Cancer, Breast cancer, Ovarian cancer, or Colon cancer.   Allergies No Known Allergies   Home Medications  Prior to Admission medications   Medication Sig Start Date End Date Taking? Authorizing Provider  acetaminophen (TYLENOL) 325 MG tablet Take 2 tablets (650 mg total) by mouth every 4 (four) hours as needed (for pain scale < 4  OR  temperature  >/=  100.5 F). 03/08/21   Hildred Laser, MD  cyclobenzaprine (FLEXERIL) 10 MG tablet Take 1 tablet (10 mg total) by mouth 3 (three) times daily as needed for muscle spasms. 03/23/21   Hildred Laser, MD  docusate sodium (COLACE) 100 MG capsule Take 1 capsule (100 mg total) by mouth 2 (  two) times daily as needed for mild constipation. 03/08/21   Hildred Laser, MD  ferrous sulfate 325 (65 FE) MG tablet Take 1 tablet (325 mg total) by mouth 2 (two) times daily with a meal. 03/08/21   Hildred Laser, MD  ondansetron (ZOFRAN-ODT) 4 MG disintegrating tablet Take 1 tablet (4 mg total) by mouth every 8 (eight) hours as needed for nausea or vomiting. 03/08/21   Hildred Laser, MD  Prenatal Vit-Fe Fumarate-FA (MULTIVITAMIN-PRENATAL) 27-0.8 MG TABS tablet Take 1 tablet by mouth daily at 12 noon.    [provider]  promethazine (PHENERGAN) 12.5 MG suppository Place rectally. 03/20/21 03/27/21  [provider]      Critical care provider statement:   Total critical care time: 33 minutes   Performed by: Karna Christmas MD   Critical care time was exclusive of separately billable procedures and treating other patients.   Critical care was necessary to treat or prevent imminent or life-threatening deterioration.   Critical care was time spent personally by me on the following activities: development of treatment plan with patient and/or surrogate as well as nursing, discussions with consultants, evaluation of patient's response to treatment, examination of patient, obtaining history from patient or surrogate, ordering and performing treatments and interventions, ordering and review of laboratory studies, ordering and review of radiographic studies, pulse oximetry and re-evaluation of patient's condition.    Vida Rigger, M.D.  Pulmonary & Critical Care Medicine

## 2021-03-28 ENCOUNTER — Inpatient Hospital Stay: Payer: Medicaid Other

## 2021-03-28 DIAGNOSIS — J90 Pleural effusion, not elsewhere classified: Secondary | ICD-10-CM | POA: Diagnosis not present

## 2021-03-28 DIAGNOSIS — E878 Other disorders of electrolyte and fluid balance, not elsewhere classified: Secondary | ICD-10-CM | POA: Diagnosis not present

## 2021-03-28 DIAGNOSIS — R0602 Shortness of breath: Secondary | ICD-10-CM | POA: Diagnosis not present

## 2021-03-28 DIAGNOSIS — O99012 Anemia complicating pregnancy, second trimester: Secondary | ICD-10-CM | POA: Diagnosis not present

## 2021-03-28 DIAGNOSIS — O26832 Pregnancy related renal disease, second trimester: Secondary | ICD-10-CM | POA: Diagnosis not present

## 2021-03-28 DIAGNOSIS — R0603 Acute respiratory distress: Secondary | ICD-10-CM | POA: Diagnosis not present

## 2021-03-28 DIAGNOSIS — Q620 Congenital hydronephrosis: Secondary | ICD-10-CM | POA: Diagnosis not present

## 2021-03-28 DIAGNOSIS — O99512 Diseases of the respiratory system complicating pregnancy, second trimester: Secondary | ICD-10-CM | POA: Diagnosis not present

## 2021-03-28 DIAGNOSIS — O99891 Other specified diseases and conditions complicating pregnancy: Secondary | ICD-10-CM | POA: Diagnosis not present

## 2021-03-28 DIAGNOSIS — N151 Renal and perinephric abscess: Secondary | ICD-10-CM | POA: Diagnosis not present

## 2021-03-28 DIAGNOSIS — Z3A25 25 weeks gestation of pregnancy: Secondary | ICD-10-CM | POA: Diagnosis not present

## 2021-03-28 DIAGNOSIS — N12 Tubulo-interstitial nephritis, not specified as acute or chronic: Secondary | ICD-10-CM | POA: Diagnosis not present

## 2021-03-28 DIAGNOSIS — O26833 Pregnancy related renal disease, third trimester: Secondary | ICD-10-CM

## 2021-03-28 DIAGNOSIS — O358XX0 Maternal care for other (suspected) fetal abnormality and damage, not applicable or unspecified: Secondary | ICD-10-CM | POA: Diagnosis not present

## 2021-03-28 LAB — BASIC METABOLIC PANEL
Anion gap: 8 (ref 5–15)
BUN: 7 mg/dL (ref 6–20)
CO2: 23 mmol/L (ref 22–32)
Calcium: 8 mg/dL — ABNORMAL LOW (ref 8.9–10.3)
Chloride: 101 mmol/L (ref 98–111)
Creatinine, Ser: 0.76 mg/dL (ref 0.44–1.00)
GFR, Estimated: >60 mL/min (ref >60)
Glucose, Bld: 76 mg/dL (ref 70–99)
Potassium: 3.3 mmol/L — ABNORMAL LOW (ref 3.5–5.1)
Sodium: 132 mmol/L — ABNORMAL LOW (ref 135–145)

## 2021-03-28 LAB — CBC WITH DIFFERENTIAL/PLATELET
Abs Immature Granulocytes: 0.08 10*3/uL — ABNORMAL HIGH (ref 0.00–0.07)
Basophils Absolute: 0 10*3/uL (ref 0.0–0.1)
Basophils Relative: 0 %
Eosinophils Absolute: 0.1 10*3/uL (ref 0.0–0.5)
Eosinophils Relative: 1 %
HCT: 28.8 % — ABNORMAL LOW (ref 36.0–46.0)
Hemoglobin: 9.3 g/dL — ABNORMAL LOW (ref 12.0–15.0)
Immature Granulocytes: 1 %
Lymphocytes Relative: 15 %
Lymphs Abs: 1.4 10*3/uL (ref 0.7–4.0)
MCH: 29.5 pg (ref 26.0–34.0)
MCHC: 32.3 g/dL (ref 30.0–36.0)
MCV: 91.4 fL (ref 80.0–100.0)
Monocytes Absolute: 0.5 10*3/uL (ref 0.1–1.0)
Monocytes Relative: 6 %
Neutro Abs: 7.1 10*3/uL (ref 1.7–7.7)
Neutrophils Relative %: 77 %
Platelets: 419 10*3/uL — ABNORMAL HIGH (ref 150–400)
RBC: 3.15 MIL/uL — ABNORMAL LOW (ref 3.87–5.11)
RDW: 13.3 % (ref 11.5–15.5)
WBC: 9.2 10*3/uL (ref 4.0–10.5)
nRBC: 0 % (ref 0.0–0.2)

## 2021-03-28 LAB — PROCALCITONIN: Procalcitonin: 0.5 ng/mL

## 2021-03-28 LAB — ACID FAST SMEAR (AFB, MYCOBACTERIA): Acid Fast Smear: NEGATIVE

## 2021-03-28 LAB — PHOSPHORUS: Phosphorus: 2.9 mg/dL (ref 2.5–4.6)

## 2021-03-28 LAB — MAGNESIUM: Magnesium: 1.6 mg/dL — ABNORMAL LOW (ref 1.7–2.4)

## 2021-03-28 LAB — CYTOLOGY - NON PAP

## 2021-03-28 MED ORDER — MAGNESIUM SULFATE 4 GM/100ML IV SOLN
4.0000 g | Freq: Once | INTRAVENOUS | Status: AC
  Administered 2021-03-28: 4 g via INTRAVENOUS
  Filled 2021-03-28: qty 100

## 2021-03-28 MED ORDER — ENOXAPARIN SODIUM 40 MG/0.4ML IJ SOSY
40.0000 mg | PREFILLED_SYRINGE | INTRAMUSCULAR | Status: DC
  Administered 2021-03-28: 40 mg via SUBCUTANEOUS
  Filled 2021-03-28: qty 0.4

## 2021-03-28 MED ORDER — POTASSIUM CHLORIDE 10 MEQ/100ML IV SOLN
10.0000 meq | INTRAVENOUS | Status: AC
Start: 2021-03-28 — End: 2021-03-29
  Administered 2021-03-28 – 2021-03-29 (×4): 10 meq via INTRAVENOUS
  Filled 2021-03-28 (×4): qty 100

## 2021-03-28 MED ORDER — POTASSIUM CHLORIDE CRYS ER 20 MEQ PO TBCR
40.0000 meq | EXTENDED_RELEASE_TABLET | Freq: Once | ORAL | Status: DC
  Filled 2021-03-28: qty 2

## 2021-03-28 MED ORDER — FENTANYL CITRATE (PF) 100 MCG/2ML IJ SOLN
50.0000 ug | Freq: Once | INTRAMUSCULAR | Status: AC
  Administered 2021-03-28: 50 ug via INTRAVENOUS
  Filled 2021-03-28: qty 2

## 2021-03-28 MED ORDER — METHOCARBAMOL 1000 MG/10ML IJ SOLN
500.0000 mg | Freq: Three times a day (TID) | INTRAVENOUS | Status: AC | PRN
  Administered 2021-03-28 – 2021-03-29 (×4): 500 mg via INTRAVENOUS
  Filled 2021-03-28: qty 5
  Filled 2021-03-28: qty 500
  Filled 2021-03-28 (×3): qty 5

## 2021-03-28 MED ORDER — HYDROMORPHONE HCL 1 MG/ML IJ SOLN
1.0000 mg | Freq: Once | INTRAMUSCULAR | Status: AC
  Administered 2021-03-28: 1 mg via INTRAVENOUS
  Filled 2021-03-28 (×2): qty 1

## 2021-03-28 MED ORDER — BOOST / RESOURCE BREEZE PO LIQD CUSTOM
1.0000 | Freq: Three times a day (TID) | ORAL | Status: DC
  Administered 2021-03-28 – 2021-03-29 (×3): 1 via ORAL

## 2021-03-28 NOTE — Progress Notes (Signed)
Antepartum Progress Note  Subjective:     Patient ID: Laura Sosa is a 35 y.o. female [redacted]w[redacted]d, Estimated Date of Delivery: 07/05/21 consistent with first trimester sono who was admitted for possible left renal abscess, unresponsive to IM antibiotic therapy.  HD# 3.  Hospital course complicated by large left pleural effusion, POD#2 s/p thoracentesis.   Subjective:  Patient reports that yesterday was not a good day for her.  Is having esophageal spasms with swallowing pills (converted from IV pain meds to PO meds yesterday), which causes her flank pain and shoulder pain to worsen.  Notes that she has been in pain most of the day yesterday as she could not tolerate the pills. Did receive a dose of IV Fentanyl at ~ 4 this morning which helped. Notes that muscle relaxers do help when she receives them.   Per partner who is present in room, patient was able to come off nasal canula O2 yesterday and maintained saturations, however requested O2 to be put back on as she began to feel "panicky" and not taking deep breaths. Was told that she could possibly receive some Valium to help her spasms and her anxiety but it was in tablet form.   Review of Systems Denies contractions, leakage of fluids, vaginal bleeding, and reports good fetal movement.     Objective:   Vitals:   03/28/21 0200 03/28/21 0300 03/28/21 0400 03/28/21 0500  BP: 93/63 99/64 106/65 (!) 100/59  Pulse: 79 87 99 81  Resp: (!) 21 (!) 28 (!) 23 19  Temp:   98.3 F (36.8 C)   TempSrc:      SpO2: 96% 95% 95% 98%  Weight:      Height:       I/O last 3 completed shifts: In: 257.6 [I.V.:7.6; IV Piggyback:250] Out: 1410 [Urine:1410] No intake/output data recorded.   General appearance: alert and no distress Lungs:  mildly decreased breath sounds at  Heart: regular rate and rhythm, S1, S2 normal, no murmur, click, rub or gallop Abdomen: soft, non-tender; bowel sounds normal; no masses,  no organomegaly Pelvic:  deferred Extremities: extremities normal, atraumatic, no cyanosis or edema   FHT: 150 bpm (overnight)     Meds:  Scheduled Meds:  Chlorhexidine Gluconate Cloth  6 each Topical Daily   potassium chloride  40 mEq Oral Once   prenatal multivitamin  1 tablet Oral Q1200   Continuous Infusions:  sodium chloride Stopped (03/26/21 2101)   cefTRIAXone (ROCEPHIN)  IV Stopped (03/27/21 1924)   magnesium sulfate bolus IVPB     PRN Meds:.sodium chloride, calcium carbonate, hydrOXYzine, morphine injection, ondansetron (ZOFRAN) IV, oxyCODONE, promethazine, zolpidem   Labs:  CBC Latest Ref Rng & Units 03/28/2021 03/25/2021 03/13/2021  WBC 4.0 - 10.5 K/uL 9.2 9.4 15.2(H)  Hemoglobin 12.0 - 15.0 g/dL 2.9(J) 10.2(L) 9.1(L)  Hematocrit 36.0 - 46.0 % 28.8(L) 31.2(L) 28.1(L)  Platelets 150 - 400 K/uL 419(H) 408(H) 470(H)    CMP Latest Ref Rng & Units 03/28/2021 03/27/2021 03/26/2021  Glucose 70 - 99 mg/dL 76 - -  BUN 6 - 20 mg/dL 7 - -  Creatinine 2.42 - 1.00 mg/dL 6.83 - 4.19  Sodium 622 - 145 mmol/L 132(L) - -  Potassium 3.5 - 5.1 mmol/L 3.3(L) - -  Chloride 98 - 111 mmol/L 101 - -  CO2 22 - 32 mmol/L 23 - -  Calcium 8.9 - 10.3 mg/dL 8.0(L) - -  Total Protein 6.5 - 8.1 g/dL - 6.5 -  Total Bilirubin 0.3 -  1.2 mg/dL - 1.2 -  Alkaline Phos 38 - 126 U/L - 106 -  AST 15 - 41 U/L - 92(H) -  ALT 0 - 44 U/L - 147(H) -    Results for orders placed or performed during the hospital encounter of 03/24/21  Urine Culture   Specimen: Urine, Clean Catch  Result Value Ref Range   Specimen Description      URINE, CLEAN CATCH Performed at [facility], [address] [place], [place] [address]    Special Requests      NONE Performed at [facility], [address] [place], [place] [address]    Culture 20,000 COLONIES/mL YEAST (A)    Report Status 03/26/2021 FINAL   Culture, blood (routine x 2)   Specimen: BLOOD RIGHT HAND  Result Value Ref Range   Specimen Description BLOOD RIGHT  HAND    Special Requests      BOTTLES DRAWN AEROBIC AND ANAEROBIC Blood Culture adequate volume   Culture      NO GROWTH 3 DAYS Performed at [facility], [address], [place], [place] [address]    Report Status PENDING   Culture, blood (routine x 2)   Specimen: BLOOD LEFT HAND  Result Value Ref Range   Specimen Description BLOOD LEFT HAND    Special Requests      BOTTLES DRAWN AEROBIC AND ANAEROBIC Blood Culture adequate volume   Culture      NO GROWTH 3 DAYS Performed at [facility], [address], [place], [place] [address]    Report Status PENDING   Body fluid culture w Gram Stain   Specimen: PATH Cytology Pleural fluid  Result Value Ref Range   Specimen Description      PLEURAL Performed at [facility], [address] [place], [place] [address]    Special Requests      PLEURAL Performed at [facility], [address] [place], [place] [address]    Gram Stain      RARE WBC PRESENT,BOTH PMN AND MONONUCLEAR NO ORGANISMS SEEN    Culture      NO GROWTH < 24 HOURS Performed at [facility], [address] [place], [place] [address]    Report Status PENDING   MRSA Next Gen by PCR, Nasal   Specimen: Nasal Mucosa; Nasal Swab  Result Value Ref Range   MRSA by PCR Next Gen NOT DETECTED NOT DETECTED  Lactate dehydrogenase (pleural or peritoneal fluid)  Result Value Ref Range   LD, Fluid 162 (H) 3 - 23 U/L   Fluid Type-FLDH CYTO PLEU   Body fluid cell count with differential  Result Value Ref Range   Fluid Type-FCT CYTO PLEU    Color, Fluid YELLOW YELLOW   Appearance, Fluid CLOUDY (A) CLEAR   Total Nucleated Cell Count, Fluid 8,143 cu mm   Neutrophil Count, Fluid 83 %   Lymphs, Fluid 10 %   Monocyte-Macrophage-Serous Fluid 7 %   Eos, Fluid 0 %  Amylase, pleural or peritoneal fluid     Result Value Ref Range   Amylase, Fluid 26 U/L   Fluid Type-FAMY CYTO PLEU   Glucose, pleural or peritoneal fluid  Result  Value Ref Range   Glucose, Fluid 44 mg/dL   Fluid Type-FGLU CYTO PLEU   Lactate dehydrogenase  Result Value Ref Range   LDH 132 98 - 192 U/L  Blood gas, arterial  Result Value Ref Range   FIO2 0.28  pH, Arterial 7.45 7.350 - 7.450   pCO2 arterial 34 32.0 - 48.0 mmHg   pO2, Arterial 92 83.0 - 108.0 mmHg   Bicarbonate 23.6 20.0 - 28.0 mmol/L   Acid-base deficit 0.0 0.0 - 2.0 mmol/L   O2 Saturation 97.5 %   Patient temperature 37.0    Collection site RIGHT RADIAL    Sample type ARTERIAL DRAW    Allens test (pass/fail) PASS PASS  Brain natriuretic peptide  Result Value Ref Range   B Natriuretic Peptide 52.2 0.0 - 100.0 pg/mL  Hepatic function panel  Result Value Ref Range   Total Protein 6.5 6.5 - 8.1 g/dL   Albumin 2.1 (L) 3.5 - 5.0 g/dL   AST 92 (H) 15 - 41 U/L   ALT 147 (H) 0 - 44 U/L   Alkaline Phosphatase 106 38 - 126 U/L   Total Bilirubin 1.2 0.3 - 1.2 mg/dL   Bilirubin, Direct 0.6 (H) 0.0 - 0.2 mg/dL   Indirect Bilirubin 0.6 0.3 - 0.9 mg/dL  Procalcitonin - Baseline  Result Value Ref Range   Procalcitonin 0.29 ng/mL  Procalcitonin  Result Value Ref Range   Procalcitonin 0.30 ng/mL  Urine Drug Screen, Qualitative ([facility] only)  Result Value Ref Range     Imaging:  US Abdomen Complete CLINICAL DATA:  Sepsis.  EXAM: ABDOMEN ULTRASOUND COMPLETE  COMPARISON:  CT angiography chest 03/25/2021, ultrasound renal 03/25/2021, MRI abdomen 03/24/2021  FINDINGS: Gallbladder: No gallstones or wall thickening visualized. No sonographic Murphy sign noted by sonographer.  Common bile duct: Diameter: 3 mm.  Liver: No focal lesion identified. Within normal limits in parenchymal echogenicity. Portal vein is patent on color Doppler imaging with normal direction of blood flow towards the liver.  IVC: No abnormality visualized.  Pancreas: Visualized portion unremarkable.  Spleen: Size and appearance within normal limits.  Right Kidney: Length: 11.7 cm. Echogenicity  within normal limits. No mass or hydronephrosis visualized.  Left Kidney: Length: 11.6 cm. Echogenicity within normal limits. Redemonstration of a 3.2 x 2.5 x 2.9 cm (from 3.7 x 3.6 x 2.2 cm) isoechoic slightly heterogeneous lesion within the superior pole the left kidney. No posterior acoustic enhancement to suggest cystic lesion. No hydronephrosis visualized.  Abdominal aorta: No aneurysm visualized.  Other findings: None.  IMPRESSION: Slightly decreased in size 3.2 x 2.5 x 2.9 cm superior left renal pole lesion that likely represents an abscess. Please note incomplete characterization on MRI abdomen 03/24/2021 due to noncontrast study.  Electronically Signed   By: Tish Frederickson M.D.   On: 03/27/2021 19:13 ECHOCARDIOGRAM COMPLETE    ECHOCARDIOGRAM REPORT       Patient Name:   Laura R [facility] Date of Exam: 03/27/2021 Medical Rec #:  [id number]          Height:       63.0 in Accession #:    [id number]         Weight:       153.0 lb Date of Birth:  June 08, 1986          BSA:          1.726 m Patient Age:    34 years           BP:           94/64 mmHg Patient Gender: F                  HR:           75 bpm. Exam Location:  [facility]  Procedure: 2D Echo, Cardiac Doppler and Color Doppler  Indications:     Acute respiratory distress R06.03   History:         Patient has no prior history of Echocardiogram examinations.                  Migraines.   Sonographer:     Cristela Blue RDCS (AE) Referring Phys:  [id number] Rosalva Ferron GRAVES Diagnosing Phys: Adrian Blackwater MD    Sonographer Comments: Suboptimal apical window. IMPRESSIONS   1. Left ventricular ejection fraction, by estimation, is 50 to 55%. The left ventricle has low normal function. The left ventricle demonstrates regional wall motion abnormalities (see scoring diagram/findings for description). There is mild concentric  left ventricular hypertrophy. Left ventricular diastolic parameters are consistent with Grade II diastolic  dysfunction (pseudonormalization).  2. Right ventricular systolic function is normal. The right ventricular size is normal.  3. Left atrial size was moderately dilated.  4. Right atrial size was moderately dilated.  5. The mitral valve is normal in structure. Mild mitral valve regurgitation. No evidence of mitral stenosis.  6. The aortic valve is normal in structure. Aortic valve regurgitation is not visualized. No aortic stenosis is present.  7. The inferior vena cava is normal in size with greater than 50% respiratory variability, suggesting right atrial pressure of 3 mmHg.  FINDINGS  Left Ventricle: Left ventricular ejection fraction, by estimation, is 50 to 55%. The left ventricle has low normal function. The left ventricle demonstrates regional wall motion abnormalities. The left ventricular internal cavity size was normal in  size. There is mild concentric left ventricular hypertrophy. Left ventricular diastolic parameters are consistent with Grade II diastolic dysfunction (pseudonormalization).  Right Ventricle: The right ventricular size is normal. No increase in right ventricular wall thickness. Right ventricular systolic function is normal.  Left Atrium: Left atrial size was moderately dilated.  Right Atrium: Right atrial size was moderately dilated.  Pericardium: There is no evidence of pericardial effusion.  Mitral Valve: The mitral valve is normal in structure. Mild mitral valve regurgitation. No evidence of mitral valve stenosis.  Tricuspid Valve: The tricuspid valve is normal in structure. Tricuspid valve regurgitation is mild . No evidence of tricuspid stenosis.  Aortic Valve: The aortic valve is normal in structure. Aortic valve regurgitation is not visualized. No aortic stenosis is present. Aortic valve mean gradient measures 3.5 mmHg. Aortic valve peak gradient measures 6.4 mmHg. Aortic valve area, by VTI  measures 1.97 cm.  Pulmonic Valve: The pulmonic valve was normal  in structure. Pulmonic valve regurgitation is not visualized. No evidence of pulmonic stenosis.  Aorta: The aortic root is normal in size and structure.  Venous: The inferior vena cava is normal in size with greater than 50% respiratory variability, suggesting right atrial pressure of 3 mmHg.  IAS/Shunts: No atrial level shunt detected by color flow Doppler.    LEFT VENTRICLE PLAX 2D LVIDd:         4.34 cm  Diastology LVIDs:         3.16 cm  LV e' medial:    10.70 cm/s LV PW:         1.17 cm  LV E/e' medial:  10.1 LV IVS:        0.83 cm  LV e' lateral:   13.30 cm/s LVOT diam:     2.00 cm  LV E/e' lateral: 8.1 LV SV:         48 LV SV Index:   28  LVOT Area:     3.14 cm    RIGHT VENTRICLE RV Basal diam:  3.98 cm RV S prime:     14.40 cm/s TAPSE (M-mode): 3.6 cm  LEFT ATRIUM             Index       RIGHT ATRIUM           Index LA diam:        2.60 cm 1.51 cm/m  RA Area:     27.30 cm LA Vol (A2C):   79.3 ml 45.96 ml/m RA Volume:   101.00 ml 58.53 ml/m LA Vol (A4C):   75.8 ml 43.93 ml/m LA Biplane Vol: 82.7 ml 47.93 ml/m  AORTIC VALVE                   PULMONIC VALVE AV Area (Vmax):    1.82 cm    PV Vmax:        0.85 m/s AV Area (Vmean):   1.85 cm    PV Peak grad:   2.9 mmHg AV Area (VTI):     1.97 cm    RVOT Peak grad: 4 mmHg AV Vmax:           126.50 cm/s AV Vmean:          90.400 cm/s AV VTI:            0.245 m AV Peak Grad:      6.4 mmHg AV Mean Grad:      3.5 mmHg LVOT Vmax:         73.10 cm/s LVOT Vmean:        53.100 cm/s LVOT VTI:          0.154 m LVOT/AV VTI ratio: 0.63   AORTA Ao Root diam: 2.70 cm  MITRAL VALVE                TRICUSPID VALVE MV Area (PHT): 3.79 cm     TR Peak grad:   23.8 mmHg MV Decel Time: 200 msec     TR Vmax:        244.00 cm/s MV E velocity: 108.00 cm/s MV A velocity: 60.40 cm/s   SHUNTS MV E/A ratio:  1.79         Systemic VTI:  0.15 m                             Systemic Diam: 2.00 cm  Adrian BlackwaterShaukat Khan MD Electronically  signed by Adrian BlackwaterShaukat Khan MD Signature Date/Time: 03/27/2021/3:33:00 PM      Final      Assessment:  35 y.o. female 8665w6d, with:    1. Renal abscess   2. Pregnancy   3. Pleural effusion   4. Status post thoracentesis   5. SOB (shortness of breath)   6. Sepsis (HCC)         Plan:   Renal abscess (left) To continue management per Urology and ID recommendations. Patient with slight decrease in size of abscess on most recent ultrasound.  Receiving daily injections of IV Ceftriaxone.  Plan is to continue IV antibiotics for at least 2-3 weeks. Remains afebrile. WBC count stable.  Urine culture showing small amount of  yeast. Blood cultures negative. Can treat with dose of Diflucan.  Pregnancy at [redacted] weeks gestation Continue to monitor fetal heart tones q shift. Fetal status overall reassuring.  Pleural effusion  S/p thoracentesis 2 days ago with  recollection of fluid.  Is likely reactive secondary to renal abscess. Pleural fluid appears non-infectious based on cultures.  Patient was able to maintain saturations off oxygen yesterday, likely indicating improvement in her effusion. However replaced due to anxiety.  Discussed management of anxiety and weaning again from O2. Can use Valium if needed in pregnancy.  Can use Lasix as needed for management of residual respiratory symptoms is some fluid collection still present.  Pain management  Patient complaining of spasms with swallowing pills, leading to worsening pain. Receives muscle relaxers which help with pain. Is requesting that her medications be liquid form or rectal instead of pills to decrease her frequency of esophageal spasms.   Will continue to follow.    A total of 15 minutes were spent face-to-face with the patient during this encounter with counseling and face-to-face interaction.    Hildred Laser, MD Obstetrics and Gynecology [facility]

## 2021-03-28 NOTE — Progress Notes (Signed)
Urology Inpatient Progress Note  Subjective: No acute events overnight.  She is afebrile, VSS. WBC count stable today, 9.2.  Creatinine slightly up today, 0.76. Spoke with patient's partner at the bedside today, he reports she had a difficult night overnight and continues to complain of spasming pain into the chest and left shoulder.  She has been off nasal cannula and maintaining saturations appropriately.  Patient is sleeping and unable to contribute to HPI today.  Anti-infectives: Anti-infectives (From admission, onward)    Start     Dose/Rate Route Frequency Ordered Stop   03/25/21 2200  Vancomycin (VANCOCIN) 1,250 mg in sodium chloride 0.9 % 250 mL IVPB  Status:  Discontinued       Note to Pharmacy: Please adjust dose accordingly. [redacted] weeks pregnant. Labs are ordered. Thank you   1,250 mg 166.7 mL/hr over 90 Minutes Intravenous Every 12 hours 03/25/21 0828 03/25/21 1139   03/25/21 1000  Vancomycin (VANCOCIN) 1,500 mg in sodium chloride 0.9 % 500 mL IVPB       Note to Pharmacy: Please adjust dose accordingly. [redacted] weeks pregnant. Labs are ordered. Thank you   1,500 mg 250 mL/hr over 120 Minutes Intravenous  Once 03/25/21 0825 03/25/21 1118   03/25/21 0900  cefTRIAXone (ROCEPHIN) 2 g in sodium chloride 0.9 % 100 mL IVPB        2 g 200 mL/hr over 30 Minutes Intravenous Every 24 hours 03/25/21 0805     03/25/21 0900  vancomycin (VANCOCIN) IVPB 1000 mg/200 mL premix  Status:  Discontinued       Note to Pharmacy: Please adjust dose accordingly. [redacted] weeks pregnant. Labs are ordered. Thank you   1,000 mg 200 mL/hr over 60 Minutes Intravenous Every 12 hours 03/25/21 0805 03/25/21 0825       Current Facility-Administered Medications  Medication Dose Route Frequency Provider Last Rate Last Admin   0.9 %  sodium chloride infusion   Intravenous PRN Linzie Collin, MD   Stopped at 03/26/21 2101   calcium carbonate (TUMS - dosed in mg elemental calcium) chewable tablet 400 mg of elemental  calcium  2 tablet Oral Q4H PRN Arnetha Courser, MD       cefTRIAXone (ROCEPHIN) 2 g in sodium chloride 0.9 % 100 mL IVPB  2 g Intravenous Q24H Arnetha Courser, MD 200 mL/hr at 03/28/21 0843 2 g at 03/28/21 0843   Chlorhexidine Gluconate Cloth 2 % PADS 6 each  6 each Topical Daily Linzie Collin, MD   6 each at 03/27/21 0935   hydrOXYzine (ATARAX/VISTARIL) tablet 25 mg  25 mg Oral TID PRN Arnetha Courser, MD   25 mg at 03/26/21 1829   magnesium sulfate IVPB 4 g 100 mL  4 g Intravenous Once Arnetha Courser, MD       morphine 2 MG/ML injection 2 mg  2 mg Intravenous Q6H PRN Lianne Cure, NP   2 mg at 03/28/21 0340   ondansetron (ZOFRAN) injection 4 mg  4 mg Intravenous Q6H PRN Arnetha Courser, MD   4 mg at 03/28/21 0346   oxyCODONE (Oxy IR/ROXICODONE) immediate release tablet 5 mg  5 mg Oral Q6H PRN Lianne Cure, NP   5 mg at 03/28/21 0251   potassium chloride SA (KLOR-CON) CR tablet 40 mEq  40 mEq Oral Once Arnetha Courser, MD       prenatal multivitamin tablet 1 tablet  1 tablet Oral Q1200 Arnetha Courser, MD       promethazine (PHENERGAN) suppository 12.5 mg  12.5 mg Rectal Q8H PRN Arnetha Courser, MD   12.5 mg at 03/25/21 1618   zolpidem (AMBIEN) tablet 5 mg  5 mg Oral QHS PRN Arnetha Courser, MD       Objective: Vital signs in last 24 hours: Temp:  [98 F (36.7 C)-98.6 F (37 C)] 98 F (36.7 C) (08/02 0800) Pulse Rate:  [75-99] 79 (08/02 0800) Resp:  [17-33] 19 (08/02 0800) BP: (92-106)/(53-73) 106/62 (08/02 0800) SpO2:  [95 %-100 %] 98 % (08/02 0800)  Intake/Output from previous day: No intake/output data recorded. Intake/Output this shift: Total I/O In: 120 [P.O.:120] Out: -   Physical Exam Vitals and nursing note reviewed.  Constitutional:      General: She is sleeping.  HENT:     Head: Normocephalic and atraumatic.  Pulmonary:     Effort: Pulmonary effort is normal. No respiratory distress.  Skin:    General: Skin is warm and dry.   Lab Results:  Recent Labs    03/28/21 0415   WBC 9.2  HGB 9.3*  HCT 28.8*  PLT 419*   BMET Recent Labs    03/26/21 0651 03/28/21 0415  NA  --  132*  K  --  3.3*  CL  --  101  CO2  --  23  GLUCOSE  --  76  BUN  --  7  CREATININE 0.60 0.76  CALCIUM  --  8.0*   ABG Recent Labs    03/26/21 1800  PHART 7.45  HCO3 23.6   Assessment & Plan: 35 year old pregnant female at [redacted]W[redacted]D admitted with multiple left renal abscesses as well as concern for possible psoas abscess.  Will defer to primary team regarding further thoracentesis versus chest tube.  Agree with continued IV antibiotics per ID recs.  Consider repeat imaging in the next 2 to 4 days if no significant improvement or in the case of clinical decline.  Carman Ching, PA-C 03/28/2021

## 2021-03-28 NOTE — Progress Notes (Signed)
Initial Nutrition Assessment  DOCUMENTATION CODES:   Not applicable  INTERVENTION:   Boost Breeze po TID, each supplement provides 250 kcal and 9 grams of protein  Vital Cuisine TID, each supplement provides 520kcal and 22g of protein.   Magic cup TID with meals, each supplement provides 290 kcal and 9 grams of protein  Prenatal multivitamin po daily   Pt at high refeed risk; recommend monitor potassium, magnesium and phosphorus labs daily until stable  NUTRITION DIAGNOSIS:   Inadequate oral intake related to nausea (hyperemesis gravidarum) as evidenced by meal completion < 50%, percent weight loss.  GOAL:   Patient will meet greater than or equal to 90% of their needs  MONITOR:   PO intake, Supplement acceptance, Labs, Weight trends, Skin, I & O's  REASON FOR ASSESSMENT:   Other (Comment) (poor po, rounds)    ASSESSMENT:   35 y.o. female with medical history significant for bilateral kidney stones (s/p left ureteroscopy, laser lithotripsy and left ureteral stent exchange on March 06, 2021 with stent being removed on 03/08/2021), migraine headaches, pyelonephritis, [redacted] weeks gestation ([id number]) and hyperemesis gravidarum who was admitted to the hospital under obstetrics and gynecologic service for left flank pain, pyuria, hematuria, pain management and IV antibiotics. Pt found to have suspected PNA and pleural effusion s/p thoracentesis with 530m output 7/31  Met with pt in room today. Pt reports decreased appetite and oral intake pta r/t hyperemesis gravidarum. Pt diagnosed with hyperemesis in March; pt also reports hyperemesis throughout her entire previous pregnancy. Pt reports her UBW is ~170-175lbs. Per chart, pt weighed 176lbs in January; pt weighed 172lbs around 8 weeks of pregnancy. Pt has lost 23lbs(13%) over the past 4 months; this is significant. Pt reports continued nausea today but no vomiting. Pt reports that she has been eating mainly fruit and cereal. Pt reports  eating half of a BLT yesterday. Pt ate some fruit for breakfast this morning. Pt reports that she has been unable to tolerate Ensure supplements. Pt has not yet tried any of the clear supplements but reports that she is willing to try peach Boost Breeze. Pt reports that she is unable to tolerate the prenatal vitamins; pt has been taking Flinstones vitamins at home. Spoke with pt today regarding the importance of adequate nutrition. RD will order supplements to help pt meet her estimated needs. Plan is for family member at bedside to bring in pt's Flintstones vitamins. Family member at bedside will also bring in a milkshake to see if pt can tolerate. Pt is likely at refeed risk. If patients oral intake does not improve, may need to consider nasogastric tube and nutrition support.   Medications reviewed and include: lovenox, hydromorphone, MVI, ceftriaxone, KCl  Labs reviewed: Na 132(L), K 3.3(L), P 2.9 wnl, Mg 1.6(L) Hgb 9.3(L), Hct 28.8(L)  NUTRITION - FOCUSED PHYSICAL EXAM:  Flowsheet Row Most Recent Value  Orbital Region No depletion  Upper Arm Region No depletion  Thoracic and Lumbar Region No depletion  Buccal Region No depletion  Temple Region No depletion  Clavicle Bone Region No depletion  Clavicle and Acromion Bone Region No depletion  Scapular Bone Region No depletion  Dorsal Hand No depletion  Patellar Region No depletion  Anterior Thigh Region No depletion  Posterior Calf Region No depletion  Edema (RD Assessment) None  Hair Reviewed  Eyes Reviewed  Mouth Reviewed  Skin Reviewed  Nails Reviewed   Diet Order:   Diet Order  Diet regular Room service appropriate? Yes; Fluid consistency: Thin  Diet effective now                  EDUCATION NEEDS:   Education needs have been addressed  Skin:  Skin Assessment: Reviewed RN Assessment  Last BM:  pta  Height:   Ht Readings from Last 1 Encounters:  03/24/21 _0  (1.6 m)    Weight:   Wt Readings  from Last 1 Encounters:  03/24/21 69.4 kg    Ideal Body Weight:  52.2 kg  BMI:  Body mass index is 27.1 kg/m.  Estimated Nutritional Needs:   Kcal:  2000-2300kcal/day  Protein:  100-115g/day  Fluid:  2.0-2.3L/day  Koleen Distance MS, RD, LDN Please refer to [facility] for RD and/or RD on-call/weekend/after hours pager

## 2021-03-28 NOTE — Progress Notes (Signed)
PROGRESS NOTE  Consult progress note.  Laura Sosa  ZOX:[id number] DOB: 28-Nov-1985 DOA: 03/24/2021 PCP: [place]   Brief Narrative: Taken from consult note. Laura Sosa is a 35 y.o. female with medical history significant for history of bilateral kidney stones, migraine headaches, pyelonephritis, G3P2002, [redacted] weeks pregnant, who was admitted to the hospital under obstetrics and gynecologic service for left flank pain, pyuria, hematuria, pain management and IV antibiotics.   She was recently treated for urinary tract infection and left renal abscess failing outpatient therapy with 9 doses of Rocephin outpatient.  She is status post left ureteral stent exchange on 03/06/2021 and subsequent stent removal on 03/08/2021.  Per ID she will need 3 to 4 weeks of antibiotics for this persistent infection.  Imaging without any new abscess.   Hospitalist service was consulted for shortness of breath, worse with exertion.  Found to have large left pleural effusion-thoracentesis with removal of 500 cc of exudative fluid, cultures pending.  Developed postthoracentesis left-sided pain and spasms along with some shortness of breath-transfer to stepdown overnight due to worsening pain and dyspnea.  Repeat chest x-ray with moderate left-sided pleural effusion, looks pretty much the same as before thoracentesis.  No pneumothorax.  Subjective: Patient continues to feel intermittent left-sided spasms and asking for more pain medications. She was resting comfortably when seen today.  Boyfriend at bedside.  Assessment & Plan:   Principal Problem:   Shortness of breath Active Problems:   History of pyelonephritis   Hyperemesis gravidarum   Hyperemesis affecting pregnancy, antepartum   Pregnancy   Anemia of pregnancy in second trimester   Renal abscess   Pleural effusion  Exertional dyspnea.  Multifactorial with current pregnancy, dilutional anemia and left pleural effusion.  Troponin,  BNP within normal limit.  Procalcitonin at 0.16, CRP of 5.7 and ESR of 85 with history of recent left renal abscess and urologic procedures.  D-dimer was mildly elevated but CTA was negative for PE. Thoracentesis was done, initial labs with exudative fluid.  Cultures negative so far. Developed severe left-sided chest pain/spasms and dyspnea postprocedural requiring multiple doses of Dilaudid, morphine and muscle relaxant.  Overnight transferred to stepdown for worsening dyspnea.  Currently seems improving, weaned off from oxygen.  Movement. Echocardiogram with normal EF and grade 2 diastolic dysfunction. -Pulmonary on board now-appreciate their help -Continue with incentive spirometry and flutter valve. -Continue with pain management  Hypomagnesemia/hypokalemia.  Might be responsible for this spasms. -Replete electrolytes and monitor.  History of recent left renal abscess/pyelonephritis/nephrolithiasis.  Patient had recent lithotripsy done, also has a stent placement followed by removal due to worsening pain.  ID and urology is also following. -Further management per ID and urology. -Continue with ceftriaxone.  Fetal monitoring. -Per OB/GYN.  Objective: Vitals:   03/28/21 1200 03/28/21 1353 03/28/21 1400 03/28/21 1500  BP:  (!) 102/59 93/63 (!) 101/59  Pulse: 88 89 84 80  Resp: (!) 28 (!) 21 18 (!) 21  Temp:  98 F (36.7 C)    TempSrc:      SpO2: 96% 98% 95% 97%  Weight:      Height:        Intake/Output Summary (Last 24 hours) at 03/28/2021 1544 Last data filed at 03/28/2021 1500 Gross per 24 hour  Intake 481.18 ml  Output 200 ml  Net 281.18 ml    Filed Weights   03/24/21 2100  Weight: 69.4 kg    Examination:  General.  Well-developed lady, in no acute distress. Pulmonary.  Decreased breath sound at left base, normal respiratory effort. CV.  Regular rate and rhythm, no JVD, rub or murmur. Abdomen.  Soft, nontender, nondistended, BS positive. CNS.  Alert and oriented  x3.  No focal neurologic deficit. Extremities.  No edema, no cyanosis, pulses intact and symmetrical. Psychiatry.  Judgment and insight appears normal.    All the records are reviewed and case discussed with Care Management/Social Worker. Management plans discussed with the patient, nursing and they are in agreement.   Procedures:  Thoracentesis  Antimicrobials:  Ceftriaxone  Data Reviewed: I have personally reviewed following labs and imaging studies  CBC: Recent Labs  Lab 03/25/21 0906 03/28/21 0415  WBC 9.4 9.2  NEUTROABS  --  7.1  HGB 10.2* 9.3*  HCT 31.2* 28.8*  MCV 89.1 91.4  PLT 408* 419*    Basic Metabolic Panel: Recent Labs  Lab 03/25/21 0906 03/26/21 0651 03/27/21 0953 03/28/21 0415  NA 134*  --   --  132*  K 3.2*  --   --  3.3*  CL 100  --   --  101  CO2 24  --   --  23  GLUCOSE 77  --   --  76  BUN 7  --   --  7  CREATININE 0.57 0.60  --  0.76  CALCIUM 8.6*  --   --  8.0*  MG  --   --  1.7 1.6*  PHOS  --   --  3.3 2.9    GFR: Estimated Creatinine Clearance: 94.4 mL/min (by C-G formula based on SCr of 0.76 mg/dL). Liver Function Tests: Recent Labs  Lab 03/27/21 0953  AST 92*  ALT 147*  ALKPHOS 106  BILITOT 1.2  PROT 6.5  ALBUMIN 2.1*    No results for input(s): LIPASE, AMYLASE in the last 168 hours. No results for input(s): AMMONIA in the last 168 hours. Coagulation Profile: Recent Labs  Lab 03/25/21 0906  INR 1.0    Cardiac Enzymes: No results for input(s): CKTOTAL, CKMB, CKMBINDEX, TROPONINI in the last 168 hours. BNP (last 3 results) No results for input(s): PROBNP in the last 8760 hours. HbA1C: No results for input(s): HGBA1C in the last 72 hours. CBG: Recent Labs  Lab 03/26/21 2117  GLUCAP 94    Lipid Profile: No results for input(s): CHOL, HDL, LDLCALC, TRIG, CHOLHDL, LDLDIRECT in the last 72 hours. Thyroid Function Tests: No results for input(s): TSH, T4TOTAL, FREET4, T3FREE, THYROIDAB in the last 72  hours.  Anemia Panel: No results for input(s): VITAMINB12, FOLATE, FERRITIN, TIBC, IRON, RETICCTPCT in the last 72 hours.  Sepsis Labs: Recent Labs  Lab 03/25/21 1332 03/27/21 0953 03/28/21 0415  PROCALCITON 0.16 0.29  0.30 0.50     Recent Results (from the past 240 hour(s))  Microscopic Examination     Status: Abnormal   Collection Time: 03/24/21 12:59 PM   Urine  Result Value Ref Range Status   WBC, UA >30 (A) 0 - 5 /hpf Final   RBC 11-30 (A) 0 - 2 /hpf Final   Epithelial Cells (non renal) 0-10 0 - 10 /hpf Final   Casts Present (A) None seen /lpf Final   Cast Type Hyaline casts N/A Final   Mucus, UA Present (A) Not Estab. Final   Bacteria, UA Moderate (A) None seen/Few Final  CULTURE, URINE COMPREHENSIVE     Status: None   Collection Time: 03/24/21  1:50 PM   Specimen: Urine   UR  Result Value Ref Range Status  Urine Culture, Comprehensive Final report  Final   Organism ID, Bacteria Comment  Final    Comment: No growth in 36 - 48 hours.  SARS CORONAVIRUS 2 (TAT 6-24 HRS) Nasopharyngeal Nasopharyngeal Swab     Status: None   Collection Time: 03/24/21  6:15 PM   Specimen: Nasopharyngeal Swab  Result Value Ref Range Status   SARS Coronavirus 2 NEGATIVE NEGATIVE Final    Comment: (NOTE) SARS-CoV-2 target nucleic acids are NOT DETECTED.  The SARS-CoV-2 RNA is generally detectable in upper and lower respiratory specimens during the acute phase of infection. Negative results do not preclude SARS-CoV-2 infection, do not rule out co-infections with other pathogens, and should not be used as the sole basis for treatment or other patient management decisions. Negative results must be combined with clinical observations, patient history, and epidemiological information. The expected result is Negative.  Fact Sheet for Patients: SugarRoll.be  Fact Sheet for Healthcare Providers: https://www.woods-mathews.com/  This test is not  yet approved or cleared by the [place] FDA and  has been authorized for detection and/or diagnosis of SARS-CoV-2 by FDA under an Emergency Use Authorization (EUA). This EUA will remain  in effect (meaning this test can be used) for the duration of the COVID-19 declaration under Se ction 564(b)(1) of the Act, 21 U.S.C. section 360bbb-3(b)(1), unless the authorization is terminated or revoked sooner.  Performed at [facility], [place] [address] [place], [place] [address]   Urine Culture     Status: Abnormal   Collection Time: 03/24/21 10:49 PM   Specimen: Urine, Clean Catch  Result Value Ref Range Status   Specimen Description   Final    URINE, CLEAN CATCH Performed at [facility], [address] [place], [place] [address]    Special Requests   Final    NONE Performed at [facility], [place], [place] [address]    Culture 20,000 COLONIES/mL YEAST (A)  Final   Report Status 03/26/2021 FINAL  Final  Culture, blood (routine x 2)     Status: None (Preliminary result)   Collection Time: 03/25/21  9:06 AM   Specimen: BLOOD RIGHT HAND  Result Value Ref Range Status   Specimen Description BLOOD RIGHT HAND  Final   Special Requests   Final    BOTTLES DRAWN AEROBIC AND ANAEROBIC Blood Culture adequate volume   Culture   Final    NO GROWTH 3 DAYS Performed at [facility], [address], [place], [place] [address]    Report Status PENDING  Incomplete  Culture, blood (routine x 2)     Status: None (Preliminary result)   Collection Time: 03/25/21  9:20 AM   Specimen: BLOOD LEFT HAND  Result Value Ref Range Status   Specimen Description BLOOD LEFT HAND  Final   Special Requests   Final    BOTTLES DRAWN AEROBIC AND ANAEROBIC Blood Culture adequate volume   Culture   Final    NO GROWTH 3 DAYS Performed at [facility], [address] [place], [place] [address]    Report Status PENDING  Incomplete  Body fluid culture  w Gram Stain     Status: None (Preliminary result)   Collection Time: 03/26/21 10:30 AM   Specimen: PATH Cytology Pleural fluid  Result Value Ref Range Status   Specimen Description   Final    PLEURAL Performed at [facility], [address], [place], [place] [address]    Special Requests  Final    PLEURAL Performed at [facility], [place], [place] [address]    Gram Stain   Final    RARE WBC PRESENT,BOTH PMN AND MONONUCLEAR NO ORGANISMS SEEN    Culture   Final    NO GROWTH 2 DAYS Performed at [facility], [place] [address], [place], [place] [address]    Report Status PENDING  Incomplete  MRSA Next Gen by PCR, Nasal     Status: None   Collection Time: 03/26/21 10:06 PM   Specimen: Nasal Mucosa; Nasal Swab  Result Value Ref Range Status   MRSA by PCR Next Gen NOT DETECTED NOT DETECTED Final    Comment: (NOTE) The GeneXpert MRSA Assay (FDA approved for NASAL specimens only), is one component of a comprehensive MRSA colonization surveillance program. It is not intended to diagnose MRSA infection nor to guide or monitor treatment for MRSA infections. Test performance is not FDA approved in patients less than 25 years old. Performed at [facility], [address], [place], [place] [address]       Radiology Studies: US Abdomen Complete  Result Date: 03/27/2021 CLINICAL DATA:  Sepsis. EXAM: ABDOMEN ULTRASOUND COMPLETE COMPARISON:  CT angiography chest 03/25/2021, ultrasound renal 03/25/2021, MRI abdomen 03/24/2021 FINDINGS: Gallbladder: No gallstones or wall thickening visualized. No sonographic Murphy sign noted by sonographer. Common bile duct: Diameter: 3 mm. Liver: No focal lesion identified. Within normal limits in parenchymal echogenicity. Portal vein is patent on color Doppler imaging with normal direction of blood flow towards the liver. IVC: No abnormality visualized. Pancreas: Visualized portion unremarkable. Spleen: Size  and appearance within normal limits. Right Kidney: Length: 11.7 cm. Echogenicity within normal limits. No mass or hydronephrosis visualized. Left Kidney: Length: 11.6 cm. Echogenicity within normal limits. Redemonstration of a 3.2 x 2.5 x 2.9 cm (from 3.7 x 3.6 x 2.2 cm) isoechoic slightly heterogeneous lesion within the superior pole the left kidney. No posterior acoustic enhancement to suggest cystic lesion. No hydronephrosis visualized. Abdominal aorta: No aneurysm visualized. Other findings: None. IMPRESSION: Slightly decreased in size 3.2 x 2.5 x 2.9 cm superior left renal pole lesion that likely represents an abscess. Please note incomplete characterization on MRI abdomen 03/24/2021 due to noncontrast study. Electronically Signed   By: Iven Finn M.D.   On: 03/27/2021 19:13   US OB Follow Up  Result Date: 03/28/2021 CLINICAL DATA:  Follow-up fetal anatomy EXAM: OBSTETRIC 14+ WK ULTRASOUND FOLLOW-UP FINDINGS: Number of Fetuses: 1 Heart Rate:  166 bpm Movement: Yes Presentation: Cephalic Previa: No Placental Location: Posterior Amniotic Fluid (Subjective): Normal Amniotic Fluid (Objective): Vertical pocket 5.3cm AFI: 15.4 cm. FETAL BIOMETRY BPD:  6.8cm 27w 3d HC:    25.5cm 27w 5d AC:    20.1cm 24w 5d FL:    4.8cm 25w 6d Current Mean GA: 26w 0d [place] EDC: 07/04/2021 Assigned GA: 25w 6d Assigned EDC: 07/05/2021 Estimated Fetal Weight:  824g 27%ile FETAL ANATOMY Lateral Ventricles: Previously seen Thalami/CSP: Previously seen Posterior Fossa: Previously seen Nuchal Region: Previously seen Upper Lip: Previously seen Spine: Appears normal 4 Chamber Heart on Left: Previously seen LVOT: Previously seen RVOT: Appears normal Stomach on Left: Previously seen 3 Vessel Cord: Previously seen Cord Insertion site: Previously seen Kidneys: Previously seen. Right renal pelvis measures 5.8 mm in diameter. Mild calyceal dilation. Left renal pelvis measures 2.9 mm in diameter. Bladder: Previously seen Extremities: Previously  seen Sex: Previously Seen Technical Limitations: Fetal positioning Maternal Findings: Cervix:  Appears closed. IMPRESSION: 1. Single  live intrauterine gestation in cephalic presentation. 2. Assigned gestational age of [redacted] weeks 6 days. Adequate interval growth. 3. Adequate visualization of the fetal spine and RVOT. 4. Mild-moderate right fetal hydronephrosis with right renal pelvis diameter of 5.8 mm and mild caliceal dilation. Unremarkable appearance of the left kidney and bladder. 5. Amniotic fluid index of 15.4 cm, within normal limits. Electronically Signed   By: Davina Poke D.O.   On: 03/28/2021 13:14   DG Chest Port 1 View  Result Date: 03/26/2021 CLINICAL DATA:  Left flank pain. EXAM: PORTABLE CHEST 1 VIEW COMPARISON:  Chest radiograph March 26, 2021. FINDINGS: Low lung volumes. Stable cardiac and mediastinal contours. Persistent moderate left pleural effusion. Underlying consolidation. No definite pneumothorax. IMPRESSION: Persistent moderate left pleural effusion with underlying consolidation. No pneumothorax. Electronically Signed   By: Lovey Newcomer M.D.   On: 03/26/2021 18:10   ECHOCARDIOGRAM COMPLETE  Result Date: 03/27/2021    ECHOCARDIOGRAM REPORT   Patient Name:   Laura Sosa Date of Exam: 03/27/2021 Medical Rec #:  [id number]          Height:       63.0 in Accession #:    [id number]         Weight:       153.0 lb Date of Birth:  Feb 06, 1986          BSA:          1.726 m Patient Age:    34 years           BP:           94/64 mmHg Patient Gender: F                  HR:           75 bpm. Exam Location:  [facility] Procedure: 2D Echo, Cardiac Doppler and Color Doppler Indications:     Acute respiratory distress R06.03  History:         Patient has no prior history of Echocardiogram examinations.                  Migraines.  Sonographer:     Sherrie Sport RDCS (AE) Referring Phys:  [id number] [place] Diagnosing Phys: Neoma Laming MD  Sonographer Comments: Suboptimal apical window. IMPRESSIONS  1.  Left ventricular ejection fraction, by estimation, is 50 to 55%. The left ventricle has low normal function. The left ventricle demonstrates regional wall motion abnormalities (see scoring diagram/findings for description). There is mild concentric left ventricular hypertrophy. Left ventricular diastolic parameters are consistent with Grade II diastolic dysfunction (pseudonormalization).  2. Right ventricular systolic function is normal. The right ventricular size is normal.  3. Left atrial size was moderately dilated.  4. Right atrial size was moderately dilated.  5. The mitral valve is normal in structure. Mild mitral valve regurgitation. No evidence of mitral stenosis.  6. The aortic valve is normal in structure. Aortic valve regurgitation is not visualized. No aortic stenosis is present.  7. The inferior vena cava is normal in size with greater than 50% respiratory variability, suggesting right atrial pressure of 3 mmHg. FINDINGS  Left Ventricle: Left ventricular ejection fraction, by estimation, is 50 to 55%. The left ventricle has low normal function. The left ventricle demonstrates regional wall motion abnormalities. The left ventricular internal cavity size was normal in size. There is mild concentric left ventricular hypertrophy. Left ventricular diastolic parameters are consistent with Grade II diastolic dysfunction (pseudonormalization). Right Ventricle: The  right ventricular size is normal. No increase in right ventricular wall thickness. Right ventricular systolic function is normal. Left Atrium: Left atrial size was moderately dilated. Right Atrium: Right atrial size was moderately dilated. Pericardium: There is no evidence of pericardial effusion. Mitral Valve: The mitral valve is normal in structure. Mild mitral valve regurgitation. No evidence of mitral valve stenosis. Tricuspid Valve: The tricuspid valve is normal in structure. Tricuspid valve regurgitation is mild . No evidence of tricuspid  stenosis. Aortic Valve: The aortic valve is normal in structure. Aortic valve regurgitation is not visualized. No aortic stenosis is present. Aortic valve mean gradient measures 3.5 mmHg. Aortic valve peak gradient measures 6.4 mmHg. Aortic valve area, by VTI measures 1.97 cm. Pulmonic Valve: The pulmonic valve was normal in structure. Pulmonic valve regurgitation is not visualized. No evidence of pulmonic stenosis. Aorta: The aortic root is normal in size and structure. Venous: The inferior vena cava is normal in size with greater than 50% respiratory variability, suggesting right atrial pressure of 3 mmHg. IAS/Shunts: No atrial level shunt detected by color flow Doppler.  LEFT VENTRICLE PLAX 2D LVIDd:         4.34 cm  Diastology LVIDs:         3.16 cm  LV e' medial:    10.70 cm/s LV PW:         1.17 cm  LV E/e' medial:  10.1 LV IVS:        0.83 cm  LV e' lateral:   13.30 cm/s LVOT diam:     2.00 cm  LV E/e' lateral: 8.1 LV SV:         48 LV SV Index:   28 LVOT Area:     3.14 cm  RIGHT VENTRICLE RV Basal diam:  3.98 cm RV S prime:     14.40 cm/s TAPSE (M-mode): 3.6 cm LEFT ATRIUM             Index       RIGHT ATRIUM           Index LA diam:        2.60 cm 1.51 cm/m  RA Area:     27.30 cm LA Vol (A2C):   79.3 ml 45.96 ml/m RA Volume:   101.00 ml 58.53 ml/m LA Vol (A4C):   75.8 ml 43.93 ml/m LA Biplane Vol: 82.7 ml 47.93 ml/m  AORTIC VALVE                   PULMONIC VALVE AV Area (Vmax):    1.82 cm    PV Vmax:        0.85 m/s AV Area (Vmean):   1.85 cm    PV Peak grad:   2.9 mmHg AV Area (VTI):     1.97 cm    RVOT Peak grad: 4 mmHg AV Vmax:           126.50 cm/s AV Vmean:          90.400 cm/s AV VTI:            0.245 m AV Peak Grad:      6.4 mmHg AV Mean Grad:      3.5 mmHg LVOT Vmax:         73.10 cm/s LVOT Vmean:        53.100 cm/s LVOT VTI:          0.154 m LVOT/AV VTI ratio: 0.63  AORTA Ao Root diam: 2.70 cm MITRAL VALVE  TRICUSPID VALVE MV Area (PHT): 3.79 cm     TR Peak grad:   23.8  mmHg MV Decel Time: 200 msec     TR Vmax:        244.00 cm/s MV E velocity: 108.00 cm/s MV A velocity: 60.40 cm/s   SHUNTS MV E/A ratio:  1.79         Systemic VTI:  0.15 m                             Systemic Diam: 2.00 cm Neoma Laming MD Electronically signed by Neoma Laming MD Signature Date/Time: 03/27/2021/3:33:00 PM    Final     Scheduled Meds:  Chlorhexidine Gluconate Cloth  6 each Topical Daily   enoxaparin (LOVENOX) injection  40 mg Subcutaneous Q24H   feeding supplement  1 Container Oral TID BM    HYDROmorphone (DILAUDID) injection  1 mg Intravenous Once   prenatal multivitamin  1 tablet Oral Q1200   Continuous Infusions:  sodium chloride Stopped (03/26/21 2101)   cefTRIAXone (ROCEPHIN)  IV Stopped (03/28/21 0913)   methocarbamol (ROBAXIN) IV 500 mg (03/28/21 1403)     LOS: 3 days   Time spent: 30 minutes. More than 50% of the time was spent in counseling/coordination of care  Lorella Nimrod, MD [facility]  If 7PM-7AM, please contact night-coverage [email or web address]  03/28/2021, 3:44 PM   This record has been created using Systems analyst. Errors have been sought and corrected,but may not always be located. Such creation errors do not reflect on the standard of care.

## 2021-03-28 NOTE — Progress Notes (Signed)
L&D nurse arrived to assess fetal heart tones. HR 135.   Will continue to monitor and assess patient.  Carmel Sacramento, RN

## 2021-03-28 NOTE — Progress Notes (Signed)
NAME:  Laura Sosa, MRN:  [id number], DOB:  [birth year], LOS: 3 ADMISSION DATE:  03/24/2021, CONSULTATION DATE:  03/26/2021 REFERRING MD:  Dr. Toniann Fail, CHIEF COMPLAINT:   LEFT flank pain  History of Present Illness:  35 yo F, who is [redacted] weeks pregnant, presented to [facility] for admission on 03/24/21 by OB-gyn at urology's request to assist with pain management and further evaluation of possible abscess & antibiotic coverage in the setting of continued LEFT flank pain/ pyuria/ hematuria. The patient underwent ureteroscopy/cystoscopy & laser management of ureteral calculi with subsequent stent placement by Dr. Apolinar Junes on 03/06/21. Patient received 9 doses of ceftriaxone outpatient without symptom relief.  Hospital course: Urology is consulted for management of ureteral abscess with OB-gyn monitoring fetal heart rate & fetal well being.  MRI without contrast 03/24/2021 >>revealed multiple left renal lesions measuring up to 4 cm similar to previous ultrasound with associated perinephric soft tissue stranding suspicious for pyelonephritis and multiple renal abscesses. Left pleural effusion. Renal ultrasound 03/25/2021>> revealed a 3.7 cm left upper pole intrarenal isoechoic collection compatible with evolving abscess.  Per urology's assessment the ultrasound also showed that previous adjacent left upper pole hypoechoic fluid collection appears to have dispersed into the perinephric and retroperitoneal space when compared to the MRI.  Negative for hydronephrosis. Dr. Denny Levy with interventional radiology was consulted and there is no collection that is amenable to aspiration.  Dr. Renold Don with infectious disease was consulted and recommended to continue ceftriaxone IV 2 g daily.    TRH service was consulted on 03/25/2021 due to complaints of shortness of breath with exertion and movement.  Patient was worked up for pulmonary embolus.   CT angio chest 03/25/2021 >> negative for PE, but does show moderate to large left  pleural effusion significantly increased from prior exam with adjacent compressive atelectasis.  Mild cardiomegaly and trace pericardial effusion.  On 03/26/2021 Dr. Denny Levy with IR performed a left thoracentesis and removed 575 mL of pleural fluid.   Postthoracentesis patient reported continued shortness of breath with tachypnea as well as pain in her left shoulder refractory to IV Dilaudid and morphine. CXR 03/26/21>> (POST- thoracentesis) persistent moderate left pleural effusion with possible underlying consolidation versus atelectasis.  No pneumothorax. Due to the patient's ongoing tachypnea, uncontrolled pain and concerns for respiratory distress patient was transferred to stepdown unit with PCCM consulted for further management and monitoring.  Pertinent  Medical History  Bilateral kidney stones Pyelonephritis  Significant Hospital Events: Including procedures, antibiotic start and stop dates in addition to other pertinent events   03/24/21- admit to Mother/baby by OB-gyn for pain control and IV antibiotics in the setting of suspected pyelonephritis with possible abscess 03/25/21- Urology & ID consulted.  Recommendations for Rocephin coverage, IR consulted but no collection amenable to aspiration.  TRH consulted for shortness of breath, work-up for PE negative but did reveal large pleural effusion. 03/26/21-IR consulted and left thoracentesis performed removing 575 mL of pleural fluid.  Later in evening transferred to ICU with concerns of respiratory distress in the setting of poor pain control and pleural effusion.  PCCM consulted. 03/27/21- patient improved clinically smiling during evaluation. Complete US abdomen today.  Patient requesting frequent IV narcotics 03/28/21- patient had abnormal TTE , cardio consult in progress, ID team to evaluate patient today.  Overnight patient requested more IV narcotics for breakthrough pain and received fentanyl.  Repeat US improved abcess.   Interim History /  Subjective: Labs/ Imaging personally reviewed > see descriptions of CT/ultrasound/CT angio/chest x-ray above  in HPI.  Postthoracentesis patient reports increased muscular pain on left side.  She reports coughing and feeling as though her "entire back locked up", she is unable to raise her arm over her head due to shoulder pain, and cannot lay on her left side.  This pain is reproducible & 10/10, being poorly controlled with dilaudid & morphine. Patient states rest helps somewhat, but does feels sharp spasms intermittently and with exertion. Patient is tachypneic but able to speak in complete sentences. SpO2 has remained stable on 2 L [place].  - Pleural fluid analysis indicative of an exudative effusion, possibly para pneumonic. No pleural protein or pH to review, cytology and gram stain pending.  Objective   Blood pressure (!) 95/56, pulse 81, temperature 98 F (36.7 C), temperature source Oral, resp. rate 18, height 5\' 3"  (1.6 m), weight 69.4 kg, SpO2 96 %.        Intake/Output Summary (Last 24 hours) at 03/28/2021 1002 Last data filed at 03/28/2021 0900 Gross per 24 hour  Intake 173.33 ml  Output --  Net 173.33 ml    Filed Weights   03/24/21 2100  Weight: 69.4 kg    Examination: General: Adult female, acutely ill, lying in bed, NAD HEENT: MM pink/moist, anicteric, atraumatic, neck supple Neuro: A&O x 4, follows commands, PERRL +3, MAE CV: s1s2 RRR, NSR on monitor, no r/m/g Pulm: Regular, tachypneic, mild dyspnea on 2 L [place], breath sounds clear-BUL & diminished-BLL  GI: soft, rounded-[redacted] weeks pregnant, non tender, bs x 4 Skin: no rashes/lesions noted Extremities: warm/dry, pulses + 2 R/P, no edema noted  Resolved Hospital Problem list     Assessment & Plan:  Moderate LEFT pleural effusion secondary to suspected cardiac dysfunction in context of active pregnancy vs parapneumonic effusion Pleural fluid analysis missing protein & pH but per Light's Criteria is indicative of an exudative  process. TNC elevated > 8,000 with CXR revealing potential underlying consolidation is suspicious for para pneumonic effusion. Currently receiving ceftriaxone for pyelonephritis - f/u Gram stain & cultures - f/u cytology - pulmonary toileting as tolerated - supplemental O2 PRN to maintain SpO2 >90%  Muscle spasms post Thoracentesis  Query nerve irritation post procedure? Patient describes this pain as different than earlier LEFT flank pain on admission. She describes pain as sharp/sore, unable to lift Left arm higher than shoulder level or lay on LEFT side. She reported coughing and feeling her "back lock up".  She reports dilaudid helps "for a little while" & morphine seems ineffective.  -reduce narcotics  -2101 abd -empiric abx - TTE -BNP -trial of lasix to conisder- patient had hypotensive episodes from renal infection   Left pyelonephritis with left renal abscess Managed by urology & ID - continue ceftriaxone 2 g daily  Best Practice (right click and "Reselect all SmartList Selections" daily)  Diet/type: Regular consistency (see orders) DVT prophylaxis: SCD GI prophylaxis: N/A Lines: N/A Foley:  N/A Code Status:  full code Last date of multidisciplinary goals of care discussion [03/26/21]  Labs   CBC: Recent Labs  Lab 03/25/21 0906 03/28/21 0415  WBC 9.4 9.2  NEUTROABS  --  7.1  HGB 10.2* 9.3*  HCT 31.2* 28.8*  MCV 89.1 91.4  PLT 408* 419*     Basic Metabolic Panel: Recent Labs  Lab 03/25/21 0906 03/26/21 0651 03/27/21 0953 03/28/21 0415  NA 134*  --   --  132*  K 3.2*  --   --  3.3*  CL 100  --   --  101  CO2 24  --   --  23  GLUCOSE 77  --   --  76  BUN 7  --   --  7  CREATININE 0.57 0.60  --  0.76  CALCIUM 8.6*  --   --  8.0*  MG  --   --  1.7 1.6*  PHOS  --   --  3.3 2.9    GFR: Estimated Creatinine Clearance: 94.4 mL/min (by C-G formula based on SCr of 0.76 mg/dL). Recent Labs  Lab 03/25/21 0906 03/25/21 1332 03/27/21 0953 03/28/21 0415   PROCALCITON  --  0.16 0.29  0.30 0.50  WBC 9.4  --   --  9.2     Liver Function Tests: Recent Labs  Lab 03/27/21 0953  AST 92*  ALT 147*  ALKPHOS 106  BILITOT 1.2  PROT 6.5  ALBUMIN 2.1*   No results for input(s): LIPASE, AMYLASE in the last 168 hours. No results for input(s): AMMONIA in the last 168 hours.  ABG    Component Value Date/Time   PHART 7.45 03/26/2021 1800   PCO2ART 34 03/26/2021 1800   PO2ART 92 03/26/2021 1800   HCO3 23.6 03/26/2021 1800   ACIDBASEDEF 0.0 03/26/2021 1800   O2SAT 97.5 03/26/2021 1800      Coagulation Profile: Recent Labs  Lab 03/25/21 0906  INR 1.0     Cardiac Enzymes: No results for input(s): CKTOTAL, CKMB, CKMBINDEX, TROPONINI in the last 168 hours.  HbA1C: No results found for: HGBA1C  CBG: Recent Labs  Lab 03/26/21 2117  GLUCAP 94    Review of Systems: Positives in BOLD  Gen: Denies fever, chills, weight change, fatigue, night sweats HEENT: Denies blurred vision, double vision, hearing loss, tinnitus, sinus congestion, rhinorrhea, sore throat, neck stiffness, dysphagia PULM: Denies shortness of breath, cough, sputum production, hemoptysis, wheezing CV: Denies chest pain, edema, orthopnea, paroxysmal nocturnal dyspnea, palpitations GI: Denies abdominal pain, nausea, vomiting, diarrhea, hematochezia, melena, constipation, change in bowel habits GU: Denies dysuria, hematuria, polyuria, oliguria, urethral discharge Endocrine: Denies hot or cold intolerance, polyuria, polyphagia or appetite change Derm: Denies rash, dry skin, scaling or peeling skin change Heme: Denies easy bruising, bleeding, bleeding gums Neuro: Denies headache, numbness, weakness, slurred speech, loss of memory or consciousness MUSK: pain & tenderness in LEFT shoulder, back & down LEFT side around thoracentesis site Past Medical History:  She,  has a past medical history of Bilateral kidney stones, Headache, History of kidney stones, Migraine, and  Pyelonephritis (04/17/2016).   Surgical History:   Past Surgical History:  Procedure Laterality Date   COLPOSCOPY     CYSTOSCOPY WITH STENT PLACEMENT Left 02/26/2021   Procedure: CYSTOSCOPY WITH STENT PLACEMENT;  Surgeon: Crista ElliotBell, Eugene D III, MD;  Location: [facility] ORS;  Service: Urology;  Laterality: Left;   CYSTOSCOPY/RETROGRADE/URETEROSCOPY  03/15/2017   Procedure: CYSTOSCOPY/RETROGRADE/URETEROSCOPY;  Surgeon: Hildred LaserBudzyn, Brian James, MD;  Location: [facility];  Service: Urology;;   CYSTOSCOPY/URETEROSCOPY/HOLMIUM LASER/STENT PLACEMENT Left 03/06/2021   Procedure: CYSTOSCOPY/URETEROSCOPY/HOLMIUM LASER/STENT PLACEMENT;  Surgeon: Vanna ScotlandBrandon, Ashley, MD;  Location: [facility];  Service: Urology;  Laterality: Left;   LEEP       Social History:   reports that she has never smoked. She has never used smokeless tobacco. She reports that she does not drink alcohol and does not use drugs.   Family History:  Her family history includes Asthma in her daughter; Diabetes in her father and mother; Emphysema in her maternal grandmother; Heart defect in her half-sister; Hypertension in her father,  mother, and paternal grandmother; Lung cancer in her maternal grandmother. There is no history of Kidney cancer, Bladder Cancer, Breast cancer, Ovarian cancer, or Colon cancer.   Allergies No Known Allergies   Home Medications  Prior to Admission medications   Medication Sig Start Date End Date Taking? Authorizing Provider  acetaminophen (TYLENOL) 325 MG tablet Take 2 tablets (650 mg total) by mouth every 4 (four) hours as needed (for pain scale < 4  OR  temperature  >/=  100.5 F). 03/08/21   Hildred Laser, MD  cyclobenzaprine (FLEXERIL) 10 MG tablet Take 1 tablet (10 mg total) by mouth 3 (three) times daily as needed for muscle spasms. 03/23/21   Hildred Laser, MD  docusate sodium (COLACE) 100 MG capsule Take 1 capsule (100 mg total) by mouth 2 (two) times daily as needed for mild constipation. 03/08/21   Hildred Laser, MD   ferrous sulfate 325 (65 FE) MG tablet Take 1 tablet (325 mg total) by mouth 2 (two) times daily with a meal. 03/08/21   Hildred Laser, MD  ondansetron (ZOFRAN-ODT) 4 MG disintegrating tablet Take 1 tablet (4 mg total) by mouth every 8 (eight) hours as needed for nausea or vomiting. 03/08/21   Hildred Laser, MD  Prenatal Vit-Fe Fumarate-FA (MULTIVITAMIN-PRENATAL) 27-0.8 MG TABS tablet Take 1 tablet by mouth daily at 12 noon.    [provider]  promethazine (PHENERGAN) 12.5 MG suppository Place rectally. 03/20/21 03/27/21  [provider]     Critical care provider statement:   Total critical care time: 33 minutes   Performed by: Karna Christmas MD   Critical care time was exclusive of separately billable procedures and treating other patients.   Critical care was necessary to treat or prevent imminent or life-threatening deterioration.   Critical care was time spent personally by me on the following activities: development of treatment plan with patient and/or surrogate as well as nursing, discussions with consultants, evaluation of patient's response to treatment, examination of patient, obtaining history from patient or surrogate, ordering and performing treatments and interventions, ordering and review of laboratory studies, ordering and review of radiographic studies, pulse oximetry and re-evaluation of patient's condition.    Vida Rigger, M.D.  Pulmonary & Critical Care Medicine

## 2021-03-28 NOTE — Consult Note (Signed)
NAME: Laura Sosa  DOB: 1986-02-18  MRN: [id number]  Date/Time: 03/28/2021 4:40 PM  REQUESTING PROVIDER: Dr. Kipp Laurence Subjective:  REASON FOR CONSULT: E. coli renal abscess ? Laura Sosa is a 35 y.o. female who is [redacted] weeks pregnant, E.coli UTI during this pregnancy Presented on 03/24/21 with ongoing left flank pain   Pt has Complicated renal infection history. Admitted 02/25/21 left sided flank pain, left PUJ calculus and hydronephrosis. Had stent placement 02/26/21. Had pan sensitive E.coli in UC. After Iv ceftriaxone  in the hospital- she was discharged home on Macrobid on 03/02/21 Back to the ED on 03/04/21 with pain . Underwent left ureterosocpy and laser lithotripsy , and stent exchange on 03/06/21. She received ceftriaxone from 03/04/21-03/06/21 Presented to [facility] on 03/13/21 with left hip pain. Had imaging and was discharged from ED on keflex. She went to [facility] on 03/15/21 and stayed until 03/20/21- got IV ceftriaxone . UC was mixed flora Ultrasound renal showed Two complex avascular hypoechoic collections within the left kidney, measuring up to 4.8 cm. In the setting of prior lithotripsy, these may represent hematomas or postsurgical sequelae. Renal abscesses remain in the differential. She  was discharged on IM ceftriaxone to get it at urology office. She got Im from 7/26-7/29/22 On 03/24/21 she was continuing to have left flank pain, pyuria and hematuria and got admitted to [facility]. Multiple left renal lesions measuring up to 4 cm are similar to previous ultrasound, and associated perinephric soft tissue stranding is demonstrated. Although these cannot be definitively characterized on this noncontrast exam, they are suspicious for pyelonephritis with multiple renal abscesses.  Similar but smaller 2 cm lesion involving the left psoas muscle,also suspicious for small abscess. The films were reviewed by urology with IR and it was thought that aspiration was not feasible ID on call recommended 2  grams of IV ceftriaxone On 03/25/21 pt was sob and underwent CTA which ruled out PE but showed moderate to large left pleural effusion .Underwent pleurocentesis on 03/26/21  and 575 cc of fluid taken from left side it had 8146 WBC -83% neutrophils. LD was 162. Serum LDH was 132  Past Medical History:  Diagnosis Date   Bilateral kidney stones    Headache    History of kidney stones    Migraine    Pyelonephritis 04/17/2016    Past Surgical History:  Procedure Laterality Date   COLPOSCOPY     CYSTOSCOPY WITH STENT PLACEMENT Left 02/26/2021   Procedure: CYSTOSCOPY WITH STENT PLACEMENT;  Surgeon: Crista Elliot, MD;  Location: [facility] ORS;  Service: Urology;  Laterality: Left;   CYSTOSCOPY/RETROGRADE/URETEROSCOPY  03/15/2017   Procedure: CYSTOSCOPY/RETROGRADE/URETEROSCOPY;  Surgeon: Hildred Laser, MD;  Location: [facility] ORS;  Service: Urology;;   CYSTOSCOPY/URETEROSCOPY/HOLMIUM LASER/STENT PLACEMENT Left 03/06/2021   Procedure: CYSTOSCOPY/URETEROSCOPY/HOLMIUM LASER/STENT PLACEMENT;  Surgeon: Vanna Scotland, MD;  Location: [facility] ORS;  Service: Urology;  Laterality: Left;   LEEP      Social History   Socioeconomic History   Marital status: Single    Spouse name: Not on file   Number of children: Not on file   Years of education: Not on file   Highest education level: Not on file  Occupational History   Not on file  Tobacco Use   Smoking status: Never   Smokeless tobacco: Never  Vaping Use   Vaping Use: Never used  Substance and Sexual Activity   Alcohol use: No   Drug use: No   Sexual activity: Yes  Other Topics Concern  Not on file  Social History Narrative   Not on file   Social Determinants of Health   Financial Resource Strain: Not on file  Food Insecurity: Not on file  Transportation Needs: Not on file  Physical Activity: Not on file  Stress: Not on file  Social Connections: Not on file  Intimate Partner Violence: Not on file    Family History  Problem Relation  Age of Onset   Hypertension Mother    Diabetes Mother    Diabetes Father    Hypertension Father    Emphysema Maternal Grandmother    Lung cancer Maternal Grandmother    Asthma Daughter    Hypertension Paternal Grandmother    Heart defect Half-Sister        "boot shaped heart"   Kidney cancer Neg Hx    Bladder Cancer Neg Hx    Breast cancer Neg Hx    Ovarian cancer Neg Hx    Colon cancer Neg Hx    No Known Allergies I? Current Facility-Administered Medications  Medication Dose Route Frequency Provider Last Rate Last Admin   0.9 %  sodium chloride infusion   Intravenous PRN Linzie Collin, MD   Stopped at 03/26/21 2101   calcium carbonate (TUMS - dosed in mg elemental calcium) chewable tablet 400 mg of elemental calcium  2 tablet Oral Q4H PRN Arnetha Courser, MD       cefTRIAXone (ROCEPHIN) 2 g in sodium chloride 0.9 % 100 mL IVPB  2 g Intravenous Q24H Arnetha Courser, MD   Stopped at 03/28/21 0913   Chlorhexidine Gluconate Cloth 2 % PADS 6 each  6 each Topical Daily Linzie Collin, MD   6 each at 03/28/21 0918   enoxaparin (LOVENOX) injection 40 mg  40 mg Subcutaneous Q24H Arnetha Courser, MD       feeding supplement (BOOST / RESOURCE BREEZE) liquid 1 Container  1 Container Oral TID BM Arnetha Courser, MD   1 Container at 03/28/21 1526   HYDROmorphone (DILAUDID) injection 1 mg  1 mg Intravenous Once Hildred Laser, MD       hydrOXYzine (ATARAX/VISTARIL) tablet 25 mg  25 mg Oral TID PRN Arnetha Courser, MD   25 mg at 03/26/21 1829   methocarbamol (ROBAXIN) 500 mg in dextrose 5 % 50 mL IVPB  500 mg Intravenous Q8H PRN Arnetha Courser, MD 100 mL/hr at 03/28/21 1403 500 mg at 03/28/21 1403   morphine 2 MG/ML injection 2 mg  2 mg Intravenous Q6H PRN Lianne Cure, NP   2 mg at 03/28/21 1536   ondansetron (ZOFRAN) injection 4 mg  4 mg Intravenous Q6H PRN Arnetha Courser, MD   4 mg at 03/28/21 1011   oxyCODONE (Oxy IR/ROXICODONE) immediate release tablet 5 mg  5 mg Oral Q6H PRN Lianne Cure, NP    5 mg at 03/28/21 0867   prenatal multivitamin tablet 1 tablet  1 tablet Oral Q1200 Arnetha Courser, MD       promethazine (PHENERGAN) suppository 12.5 mg  12.5 mg Rectal Q8H PRN Arnetha Courser, MD   12.5 mg at 03/25/21 1618   zolpidem (AMBIEN) tablet 5 mg  5 mg Oral QHS PRN Arnetha Courser, MD         Abtx:  Anti-infectives (From admission, onward)    Start     Dose/Rate Route Frequency Ordered Stop   03/25/21 2200  Vancomycin (VANCOCIN) 1,250 mg in sodium chloride 0.9 % 250 mL IVPB  Status:  Discontinued  Note to Pharmacy: Please adjust dose accordingly. [redacted] weeks pregnant. Labs are ordered. Thank you   1,250 mg 166.7 mL/hr over 90 Minutes Intravenous Every 12 hours 03/25/21 0828 03/25/21 1139   03/25/21 1000  Vancomycin (VANCOCIN) 1,500 mg in sodium chloride 0.9 % 500 mL IVPB       Note to Pharmacy: Please adjust dose accordingly. [redacted] weeks pregnant. Labs are ordered. Thank you   1,500 mg 250 mL/hr over 120 Minutes Intravenous  Once 03/25/21 0825 03/25/21 1118   03/25/21 0900  cefTRIAXone (ROCEPHIN) 2 g in sodium chloride 0.9 % 100 mL IVPB        2 g 200 mL/hr over 30 Minutes Intravenous Every 24 hours 03/25/21 0805     03/25/21 0900  vancomycin (VANCOCIN) IVPB 1000 mg/200 mL premix  Status:  Discontinued       Note to Pharmacy: Please adjust dose accordingly. [redacted] weeks pregnant. Labs are ordered. Thank you   1,000 mg 200 mL/hr over 60 Minutes Intravenous Every 12 hours 03/25/21 0805 03/25/21 0825       REVIEW OF SYSTEMS:  Const: negative fever, negative chills, negative weight loss Eyes: negative diplopia or visual changes, negative eye pain ENT: negative coryza, negative sore throat Resp: negative cough, hemoptysis, has dyspnea Cards: Has left-sided chest pain, no palpitations, lower extremity edema GU: negative for frequency, dysuria and hematuria GI: Negative for abdominal pain, diarrhea, bleeding, constipation Skin: negative for rash and pruritus Heme: negative for easy  bruising and gum/nose bleeding MS: Back pain and generalized weakness Neurolo:negative for headaches, dizziness, vertigo, memory problems  Psych:  Depressed Endocrine: negative for thyroid, diabetes Allergy/Immunology- negative for any medication or food allergies  Objective:  VITALS:  BP 107/67   Pulse 95   Temp 98 F (36.7 C)   Resp (!) 29   Ht  (1.6 m)   Wt 69.4 kg   LMP  (LMP Unknown)   SpO2 96%   BMI 27.10 kg/m  PHYSICAL EXAM:  General: Alert, cooperative, some respiratory distress, appears stated age.  Head: Normocephalic, without obvious abnormality, atraumatic. Eyes: Conjunctivae clear, anicteric sclerae. Pupils are equal ENT Nares normal. No drainage or sinus tenderness. Lips, mucosa, and tongue normal. No Thrush Neck: Supple, symmetrical, no adenopathy, thyroid: non tender no carotid bruit and no JVD. Back: No CVA tenderness. Lungs: Decreased air entry on the left side Heart: Regular rate and rhythm, no murmur, rub or gallop. Abdomen: Soft, non-tender,not distended. Bowel sounds normal. No masses Extremities: atraumatic, no cyanosis. No edema. No clubbing Skin: No rashes or lesions. Or bruising.  Has extensive tattoos Lymph: Cervical, supraclavicular normal. Neurologic: Grossly non-focal Pertinent Labs Lab Results CBC    Component Value Date/Time   WBC 9.2 03/28/2021 0415   RBC 3.15 (L) 03/28/2021 0415   HGB 9.3 (L) 03/28/2021 0415   HGB 12.4 12/22/2020 1535   HCT 28.8 (L) 03/28/2021 0415   HCT 35.8 12/22/2020 1535   PLT 419 (H) 03/28/2021 0415   PLT 237 12/22/2020 1535   MCV 91.4 03/28/2021 0415   MCV 87 12/22/2020 1535   MCH 29.5 03/28/2021 0415   MCHC 32.3 03/28/2021 0415   RDW 13.3 03/28/2021 0415   RDW 13.2 12/22/2020 1535   LYMPHSABS 1.4 03/28/2021 0415   LYMPHSABS 1.2 12/22/2020 1535   MONOABS 0.5 03/28/2021 0415   EOSABS 0.1 03/28/2021 0415   EOSABS 0.1 12/22/2020 1535   BASOSABS 0.0 03/28/2021 0415   BASOSABS 0.0 12/22/2020 1535     CMP Latest Ref  Rng & Units 03/28/2021 03/27/2021 03/26/2021  Glucose 70 - 99 mg/dL 76 - -  BUN 6 - 20 mg/dL 7 - -  Creatinine 1.610.44 - 1.00 mg/dL 0.960.76 - 0.450.60  Sodium 409135 - 145 mmol/L 132(L) - -  Potassium 3.5 - 5.1 mmol/L 3.3(L) - -  Chloride 98 - 111 mmol/L 101 - -  CO2 22 - 32 mmol/L 23 - -  Calcium 8.9 - 10.3 mg/dL 8.0(L) - -  Total Protein 6.5 - 8.1 g/dL - 6.5 -  Total Bilirubin 0.3 - 1.2 mg/dL - 1.2 -  Alkaline Phos 38 - 126 U/L - 106 -  AST 15 - 41 U/L - 92(H) -  ALT 0 - 44 U/L - 147(H) -      Microbiology: Recent Results (from the past 240 hour(s))  Microscopic Examination     Status: Abnormal   Collection Time: 03/24/21 12:59 PM   Urine  Result Value Ref Range Status   WBC, UA >30 (A) 0 - 5 /hpf Final   RBC 11-30 (A) 0 - 2 /hpf Final   Epithelial Cells (non renal) 0-10 0 - 10 /hpf Final   Casts Present (A) None seen /lpf Final   Cast Type Hyaline casts N/A Final   Mucus, UA Present (A) Not Estab. Final   Bacteria, UA Moderate (A) None seen/Few Final  CULTURE, URINE COMPREHENSIVE     Status: None   Collection Time: 03/24/21  1:50 PM   Specimen: Urine   UR  Result Value Ref Range Status   Urine Culture, Comprehensive Final report  Final   Organism ID, Bacteria Comment  Final    Comment: No growth in 36 - 48 hours.  SARS CORONAVIRUS 2 (TAT 6-24 HRS) Nasopharyngeal Nasopharyngeal Swab     Status: None   Collection Time: 03/24/21  6:15 PM   Specimen: Nasopharyngeal Swab  Result Value Ref Range Status   SARS Coronavirus 2 NEGATIVE NEGATIVE Final    Comment: (NOTE) SARS-CoV-2 target nucleic acids are NOT DETECTED.  The SARS-CoV-2 RNA is generally detectable in upper and lower respiratory specimens during the acute phase of infection. Negative results do not preclude SARS-CoV-2 infection, do not rule out co-infections with other pathogens, and should not be used as the sole basis for treatment or other patient management decisions. Negative results must be combined  with clinical observations, patient history, and epidemiological information. The expected result is Negative.  Fact Sheet for Patients: HairSlick.nohttps://www.fda.gov/media/138098/download  Fact Sheet for Healthcare Providers: quierodirigir.comhttps://www.fda.gov/media/138095/download  This test is not yet approved or cleared by the [place] FDA and  has been authorized for detection and/or diagnosis of SARS-CoV-2 by FDA under an Emergency Use Authorization (EUA). This EUA will remain  in effect (meaning this test can be used) for the duration of the COVID-19 declaration under Se ction 564(b)(1) of the Act, 21 U.S.C. section 360bbb-3(b)(1), unless the authorization is terminated or revoked sooner.  Performed at [facility], [address] [place], [place] [address]   Urine Culture     Status: Abnormal   Collection Time: 03/24/21 10:49 PM   Specimen: Urine, Clean Catch  Result Value Ref Range Status   Specimen Description   Final    URINE, CLEAN CATCH Performed at [facility], [address] [place], [place] [address]    Special Requests   Final    NONE Performed at [facility], [address] [place], [place] [address]    Culture 20,000 COLONIES/mL YEAST (A)  Final   Report Status 03/26/2021 FINAL  Final  Culture, blood (routine x 2)     Status: None (Preliminary result)   Collection Time: 03/25/21  9:06 AM   Specimen: BLOOD RIGHT HAND  Result Value Ref Range Status   Specimen Description BLOOD RIGHT HAND  Final   Special Requests   Final    BOTTLES DRAWN AEROBIC AND ANAEROBIC Blood Culture adequate volume   Culture   Final    NO GROWTH 3 DAYS Performed at [facility], [address], [place], [place] [address]    Report Status PENDING  Incomplete  Culture, blood (routine x 2)     Status: None (Preliminary result)   Collection Time: 03/25/21  9:20 AM   Specimen: BLOOD LEFT HAND  Result Value Ref Range Status   Specimen Description BLOOD LEFT  HAND  Final   Special Requests   Final    BOTTLES DRAWN AEROBIC AND ANAEROBIC Blood Culture adequate volume   Culture   Final    NO GROWTH 3 DAYS Performed at [facility], [address] [place], [place] [address]    Report Status PENDING  Incomplete  Body fluid culture w Gram Stain     Status: None (Preliminary result)   Collection Time: 03/26/21 10:30 AM   Specimen: PATH Cytology Pleural fluid  Result Value Ref Range Status   Specimen Description   Final    PLEURAL Performed at [facility], [address], [place], [place] [address]    Special Requests   Final    PLEURAL Performed at [facility], [address] [place], [place] [address]    Gram Stain   Final    RARE WBC PRESENT,BOTH PMN AND MONONUCLEAR NO ORGANISMS SEEN    Culture   Final    NO GROWTH 2 DAYS Performed at [facility], [address] [place], [place] [address]    Report Status PENDING  Incomplete  MRSA Next Gen by PCR, Nasal     Status: None   Collection Time: 03/26/21 10:06 PM   Specimen: Nasal Mucosa; Nasal Swab  Result Value Ref Range Status   MRSA by PCR Next Gen NOT DETECTED NOT DETECTED Final    Comment: (NOTE) The GeneXpert MRSA Assay (FDA approved for NASAL specimens only), is one component of a comprehensive MRSA colonization surveillance program. It is not intended to diagnose MRSA infection nor to guide or monitor treatment for MRSA infections. Test performance is not FDA approved in patients less than 33 years old. Performed at [facility], [address] [place], [place] [address]     IMAGING RESULTS:  I have personally reviewed the films ? Left pleural effusion  Impression/Recommendation ? Left renal pyelonephritis with multiple abscesses secondary to renal stone which has been lithotripsied and she had a stent in the past which has been removed. This has been going on for the last 5 to 6 weeks. She has not been adequately  treated. Will give her IV ceftriaxone 2 g for 4 weeks till resolution of the renal abscess.  Will discuss with IR to see whether some of them could be aspirated  Left pleural effusion with hypoxia Looks like an exudate.  Culture negative so far   25-week pregnant. ? Anemia  Abnormal LFTs ___________________________________________________ Discussed with patient, requesting provider.  We will follow her closely Note:  This document was prepared using Dragon voice recognition software and may include unintentional dictation errors.

## 2021-03-29 DIAGNOSIS — Z3A25 25 weeks gestation of pregnancy: Secondary | ICD-10-CM | POA: Diagnosis not present

## 2021-03-29 DIAGNOSIS — N151 Renal and perinephric abscess: Secondary | ICD-10-CM | POA: Diagnosis not present

## 2021-03-29 DIAGNOSIS — O99512 Diseases of the respiratory system complicating pregnancy, second trimester: Secondary | ICD-10-CM | POA: Diagnosis not present

## 2021-03-29 DIAGNOSIS — N12 Tubulo-interstitial nephritis, not specified as acute or chronic: Secondary | ICD-10-CM | POA: Diagnosis not present

## 2021-03-29 DIAGNOSIS — O26832 Pregnancy related renal disease, second trimester: Secondary | ICD-10-CM | POA: Diagnosis not present

## 2021-03-29 DIAGNOSIS — J90 Pleural effusion, not elsewhere classified: Secondary | ICD-10-CM

## 2021-03-29 DIAGNOSIS — O26833 Pregnancy related renal disease, third trimester: Secondary | ICD-10-CM | POA: Diagnosis not present

## 2021-03-29 DIAGNOSIS — Z9889 Other specified postprocedural states: Secondary | ICD-10-CM

## 2021-03-29 DIAGNOSIS — O99012 Anemia complicating pregnancy, second trimester: Secondary | ICD-10-CM | POA: Diagnosis not present

## 2021-03-29 DIAGNOSIS — R0602 Shortness of breath: Secondary | ICD-10-CM | POA: Diagnosis not present

## 2021-03-29 DIAGNOSIS — O99891 Other specified diseases and conditions complicating pregnancy: Secondary | ICD-10-CM | POA: Diagnosis not present

## 2021-03-29 DIAGNOSIS — E878 Other disorders of electrolyte and fluid balance, not elsewhere classified: Secondary | ICD-10-CM | POA: Diagnosis not present

## 2021-03-29 LAB — COMP PANEL: LEUKEMIA/LYMPHOMA

## 2021-03-29 MED ORDER — ZOLPIDEM TARTRATE 5 MG PO TABS: 5.0000 mg | ORAL_TABLET | Freq: Every day | ORAL | Status: DC

## 2021-03-29 MED ORDER — HYDROMORPHONE HCL 2 MG PO TABS
2.0000 mg | ORAL_TABLET | ORAL | Status: DC | PRN
Start: 2021-03-29 — End: 2021-03-29

## 2021-03-29 MED ORDER — POTASSIUM CHLORIDE CRYS ER 20 MEQ PO TBCR
40.0000 meq | EXTENDED_RELEASE_TABLET | Freq: Once | ORAL | Status: DC
Start: 2021-03-29 — End: 2021-03-31

## 2021-03-29 MED ORDER — MAGNESIUM SULFATE 2 GM/50ML IV SOLN
2.0000 g | Freq: Once | INTRAVENOUS | Status: AC
  Administered 2021-03-29: 2 g via INTRAVENOUS
  Filled 2021-03-29: qty 50

## 2021-03-29 MED ORDER — GABAPENTIN 250 MG/5ML PO SOLN
300.0000 mg | Freq: Three times a day (TID) | ORAL | Status: DC
  Administered 2021-03-29 – 2021-03-31 (×5): 300 mg via ORAL
  Filled 2021-03-29 (×11): qty 6

## 2021-03-29 MED ORDER — GABAPENTIN 300 MG PO CAPS: 300.0000 mg | ORAL_CAPSULE | Freq: Three times a day (TID) | ORAL | Status: DC

## 2021-03-29 MED ORDER — FLUCONAZOLE IN SODIUM CHLORIDE 200-0.9 MG/100ML-% IV SOLN
200.0000 mg | Freq: Once | INTRAVENOUS | Status: AC
  Administered 2021-03-29: 200 mg via INTRAVENOUS
  Filled 2021-03-29: qty 100

## 2021-03-29 MED ORDER — HYDROMORPHONE HCL 1 MG/ML PO LIQD
2.0000 mg | ORAL | Status: DC | PRN
  Administered 2021-03-29 – 2021-03-30 (×6): 2 mg via ORAL
  Filled 2021-03-29 (×14): qty 2

## 2021-03-29 NOTE — Progress Notes (Signed)
Antepartum Progress Note  Subjective:     Patient ID: Laura Sosa is a 35 y.o. G60P2002 female [redacted]w[redacted]d, Estimated Date of Delivery: 07/05/21 consistent with first trimester sono who was admitted for possible left renal abscess, unresponsive to IM antibiotic therapy.  HD# 4.  Hospital course complicated by large left pleural effusion, POD#3 s/p thoracentesis.   Subjective:  Patient reports difficulty sleeping. Did not go to sleep until 5:30 this morning.  No longer experiencing shortness of breath, is off oxygen. Notes her left-sided weakness has started to improve.  Now just "comes and goes". Still is having "esophageal spasms" with swallowing pills.   Had ultrasound yesterday and she and her partner were happy to see the baby.   Review of Systems Denies contractions, leakage of fluids, vaginal bleeding, and reports good fetal movement.     Objective:   Vitals:   03/29/21 0300 03/29/21 0400 03/29/21 0700 03/29/21 0800  BP: (!) 89/56 102/66 (!) 88/55 91/62  Pulse: 83 84 80 72  Resp: (!) 27 (!) 26 18 (!) 21  Temp:  98.2 F (36.8 C)    TempSrc:  Oral    SpO2: 96% 92% 95% 94%  Weight:      Height:       I/O last 3 completed shifts: In: 701.2 [P.O.:360; IV Piggyback:341.2] Out: 550 [Urine:550] No intake/output data recorded.   General appearance: alert and no distress Lungs:  mildly decreased breath sounds at left lower lung base, improved since yesterday.   Heart: regular rate and rhythm, S1, S2 normal, no murmur, click, rub or gallop Abdomen: soft, non-tender; bowel sounds normal; no masses,  no organomegaly Pelvic: deferred Extremities: extremities normal, atraumatic, no cyanosis or edema   FHT: 135 bpm (overnight)     Meds:  Scheduled Meds:  Chlorhexidine Gluconate Cloth  6 each Topical Daily   enoxaparin (LOVENOX) injection  40 mg Subcutaneous Q24H   feeding supplement  1 Container Oral TID BM   prenatal multivitamin  1 tablet Oral Q1200   Continuous  Infusions:  sodium chloride Stopped (03/26/21 2101)   cefTRIAXone (ROCEPHIN)  IV Stopped (03/28/21 0913)   methocarbamol (ROBAXIN) IV 500 mg (03/29/21 0530)   PRN Meds:.sodium chloride, calcium carbonate, hydrOXYzine, methocarbamol (ROBAXIN) IV, morphine injection, ondansetron (ZOFRAN) IV, oxyCODONE, promethazine, zolpidem   Labs:  CBC Latest Ref Rng & Units 03/28/2021 03/25/2021 03/13/2021  WBC 4.0 - 10.5 K/uL 9.2 9.4 15.2(H)  Hemoglobin 12.0 - 15.0 g/dL 1.4(E) 10.2(L) 9.1(L)  Hematocrit 36.0 - 46.0 % 28.8(L) 31.2(L) 28.1(L)  Platelets 150 - 400 K/uL 419(H) 408(H) 470(H)    CMP Latest Ref Rng & Units 03/28/2021 03/27/2021 03/26/2021  Glucose 70 - 99 mg/dL 76 - -  BUN 6 - 20 mg/dL 7 - -  Creatinine 3.15 - 1.00 mg/dL 4.00 - 8.67  Sodium 619 - 145 mmol/L 132(L) - -  Potassium 3.5 - 5.1 mmol/L 3.3(L) - -  Chloride 98 - 111 mmol/L 101 - -  CO2 22 - 32 mmol/L 23 - -  Calcium 8.9 - 10.3 mg/dL 8.0(L) - -  Total Protein 6.5 - 8.1 g/dL - 6.5 -  Total Bilirubin 0.3 - 1.2 mg/dL - 1.2 -  Alkaline Phos 38 - 126 U/L - 106 -  AST 15 - 41 U/L - 92(H) -  ALT 0 - 44 U/L - 147(H) -     Imaging:  US OB Follow Up CLINICAL DATA:  Follow-up fetal anatomy  EXAM: OBSTETRIC 14+ WK ULTRASOUND FOLLOW-UP  FINDINGS: Number  of Fetuses: 1  Heart Rate:  166 bpm  Movement: Yes  Presentation: Cephalic  Previa: No  Placental Location: Posterior  Amniotic Fluid (Subjective): Normal  Amniotic Fluid (Objective):  Vertical pocket 5.3cm  AFI: 15.4 cm.  FETAL BIOMETRY  BPD:  6.8cm 27w 3d  HC:    25.5cm 27w 5d  AC:    20.1cm 24w 5d  FL:    4.8cm 25w 6d  Current Mean GA: 26w 0d [place] EDC: 07/04/2021  Assigned GA: 25w 6d Assigned EDC: 07/05/2021  Estimated Fetal Weight:  824g 27%ile  FETAL ANATOMY  Lateral Ventricles: Previously seen  Thalami/CSP: Previously seen  Posterior Fossa: Previously seen  Nuchal Region: Previously seen  Upper Lip: Previously seen  Spine: Appears normal  4  Chamber Heart on Left: Previously seen  LVOT: Previously seen  RVOT: Appears normal  Stomach on Left: Previously seen  3 Vessel Cord: Previously seen  Cord Insertion site: Previously seen  Kidneys: Previously seen. Right renal pelvis measures 5.8 mm in diameter. Mild calyceal dilation. Left renal pelvis measures 2.9 mm in diameter.  Bladder: Previously seen  Extremities: Previously seen  Sex: Previously Seen  Technical Limitations: Fetal positioning  Maternal Findings:  Cervix:  Appears closed.  IMPRESSION: 1. Single live intrauterine gestation in cephalic presentation. 2. Assigned gestational age of [redacted] weeks 6 days. Adequate interval growth. 3. Adequate visualization of the fetal spine and RVOT. 4. Mild-moderate right fetal hydronephrosis with right renal pelvis diameter of 5.8 mm and mild caliceal dilation. Unremarkable appearance of the left kidney and bladder. 5. Amniotic fluid index of 15.4 cm, within normal limits.  Electronically Signed   By: Duanne Guess D.O.   On: 03/28/2021 13:14    Assessment:  35 y.o. female [redacted]w[redacted]d, with:    1. Renal abscess   2. Pregnancy   3. Pleural effusion   4. Status post thoracentesis   5. SOB (shortness of breath)   6. Sepsis (HCC)         Plan:   Renal abscess (left) To continue management per ID recommendations. Patient with slight decrease in size of abscess on most recent ultrasound yesterday.  Receiving daily injections of IV Ceftriaxone.  Plan is to continue IV antibiotics for at least 3 weeks. Mentioned possibility of another attempt at IR drainage but unclear if this will occur. Remains afebrile. WBC count stable. As it is slowly improving, could just continue to treat with IV antibiotics.  Can also consider PICC line as outpatient to continue antibiotics once stable.  Urine culture showing small amount of  yeast. Blood cultures negative. Treated with dose of IV Diflucan.  Pregnancy at [redacted] weeks  gestation Continue to monitor fetal heart tones q shift. Fetal status overall reassuring.  S/p f/u of anatomy scan yesterday, normal. Started on Lovenox yesterday for DVT prophylaxis Pleural effusion  S/p thoracentesis 3 days ago with recollection of fluid.  Is likely reactive secondary to renal abscess (exudate). Pleural fluid appears non-infectious based on cultures.  Patient now able to maintain sats on room air.  Pain management  Patient still noting spasms with swallowing pills, leading to worsening pain. Continue liquid form or rectal form of medications as much as possible instead of pills to decrease her frequency of esophageal spasms and increase compliance.  Left-sided weakness  Weakness improving.  Patient ambulating to bedside commode. Likely temporal weakness after procedure (possible nerve injury). Will likely continue to improve and resolve time.  Continue to encourage ambulation as much as possible.  Will continue to follow.    A total of 15 minutes were spent face-to-face with the patient during this encounter with counseling and face-to-face interaction.    Hildred Laser, MD Obstetrics and Gynecology [facility]

## 2021-03-29 NOTE — Progress Notes (Signed)
Urology Consult Follow Up  Subjective: This morning's update was obtained by nursing staff and chart review as patient was sleeping and asked her pain is been very difficult to get under control, I decided not to wake her.  She is afebrile.  Tachypneic.  Creatinine remained stable at 0.76.  WBC count normal at 9.2.    Fluid culture from thoracentesis still in preliminary status, but with no growth in 2 days and cytology negative for malignancy.  Nursing staff states there are still issues with adequate pain control.  The patient continues to complain of left shoulder pain radiating into the left upper back.  Anti-infectives: Anti-infectives (From admission, onward)    Start     Dose/Rate Route Frequency Ordered Stop   03/25/21 2200  Vancomycin (VANCOCIN) 1,250 mg in sodium chloride 0.9 % 250 mL IVPB  Status:  Discontinued       Note to Pharmacy: Please adjust dose accordingly. [redacted] weeks pregnant. Labs are ordered. Thank you   1,250 mg 166.7 mL/hr over 90 Minutes Intravenous Every 12 hours 03/25/21 0828 03/25/21 1139   03/25/21 1000  Vancomycin (VANCOCIN) 1,500 mg in sodium chloride 0.9 % 500 mL IVPB       Note to Pharmacy: Please adjust dose accordingly. [redacted] weeks pregnant. Labs are ordered. Thank you   1,500 mg 250 mL/hr over 120 Minutes Intravenous  Once 03/25/21 0825 03/25/21 1118   03/25/21 0900  cefTRIAXone (ROCEPHIN) 2 g in sodium chloride 0.9 % 100 mL IVPB        2 g 200 mL/hr over 30 Minutes Intravenous Every 24 hours 03/25/21 0805     03/25/21 0900  vancomycin (VANCOCIN) IVPB 1000 mg/200 mL premix  Status:  Discontinued       Note to Pharmacy: Please adjust dose accordingly. [redacted] weeks pregnant. Labs are ordered. Thank you   1,000 mg 200 mL/hr over 60 Minutes Intravenous Every 12 hours 03/25/21 0805 03/25/21 0825       Current Facility-Administered Medications  Medication Dose Route Frequency Provider Last Rate Last Admin   0.9 %  sodium chloride infusion   Intravenous PRN  Linzie Collin, MD   Stopped at 03/26/21 2101   calcium carbonate (TUMS - dosed in mg elemental calcium) chewable tablet 400 mg of elemental calcium  2 tablet Oral Q4H PRN Arnetha Courser, MD       cefTRIAXone (ROCEPHIN) 2 g in sodium chloride 0.9 % 100 mL IVPB  2 g Intravenous Q24H Arnetha Courser, MD   Stopped at 03/28/21 0913   Chlorhexidine Gluconate Cloth 2 % PADS 6 each  6 each Topical Daily Linzie Collin, MD   6 each at 03/28/21 0918   enoxaparin (LOVENOX) injection 40 mg  40 mg Subcutaneous Q24H Arnetha Courser, MD   40 mg at 03/28/21 2101   feeding supplement (BOOST / RESOURCE BREEZE) liquid 1 Container  1 Container Oral TID BM Arnetha Courser, MD   1 Container at 03/28/21 1526   hydrOXYzine (ATARAX/VISTARIL) tablet 25 mg  25 mg Oral TID PRN Arnetha Courser, MD   25 mg at 03/26/21 1829   methocarbamol (ROBAXIN) 500 mg in dextrose 5 % 50 mL IVPB  500 mg Intravenous Q8H PRN Arnetha Courser, MD 100 mL/hr at 03/29/21 0530 500 mg at 03/29/21 0530   morphine 2 MG/ML injection 2 mg  2 mg Intravenous Q6H PRN Lianne Cure, NP   2 mg at 03/29/21 0341   ondansetron (ZOFRAN) injection 4 mg  4 mg Intravenous  Q6H PRN Arnetha Courser, MD   4 mg at 03/29/21 0346   oxyCODONE (Oxy IR/ROXICODONE) immediate release tablet 5 mg  5 mg Oral Q6H PRN Lianne Cure, NP   5 mg at 03/28/21 9741   prenatal multivitamin tablet 1 tablet  1 tablet Oral Q1200 Arnetha Courser, MD       promethazine (PHENERGAN) suppository 12.5 mg  12.5 mg Rectal Q8H PRN Arnetha Courser, MD   12.5 mg at 03/25/21 1618   zolpidem (AMBIEN) tablet 5 mg  5 mg Oral QHS PRN Arnetha Courser, MD         Objective: Vital signs in last 24 hours: Temp:  [97.7 F (36.5 C)-98.2 F (36.8 C)] 98.2 F (36.8 C) (08/03 0400) Pulse Rate:  [76-101] 84 (08/03 0400) Resp:  [18-30] 26 (08/03 0400) BP: (89-109)/(54-80) 102/66 (08/03 0400) SpO2:  [82 %-98 %] 92 % (08/03 0400)  Intake/Output from previous day: 08/02 0701 - 08/03 0700 In: 701.2 [P.O.:360; IV  Piggyback:341.2] Out: 550 [Urine:550] Intake/Output this shift: No intake/output data recorded.   Physical Exam Constitutional:  Well nourished. Alert and oriented, No acute distress. HEENT: [place] AT, mask in place.  Trachea midline Cardiovascular: No clubbing, cyanosis, or edema. Respiratory: Normal respiratory effort, no increased work of breathing.  Lab Results:  Recent Labs    03/28/21 0415  WBC 9.2  HGB 9.3*  HCT 28.8*  PLT 419*   BMET Recent Labs    03/28/21 0415  NA 132*  K 3.3*  CL 101  CO2 23  GLUCOSE 76  BUN 7  CREATININE 0.76  CALCIUM 8.0*   PT/INR No results for input(s): LABPROT, INR in the last 72 hours. ABG Recent Labs    03/26/21 1800  PHART 7.45  HCO3 23.6    Studies/Results: US Abdomen Complete  Result Date: 03/27/2021 CLINICAL DATA:  Sepsis. EXAM: ABDOMEN ULTRASOUND COMPLETE COMPARISON:  CT angiography chest 03/25/2021, ultrasound renal 03/25/2021, MRI abdomen 03/24/2021 FINDINGS: Gallbladder: No gallstones or wall thickening visualized. No sonographic Murphy sign noted by sonographer. Common bile duct: Diameter: 3 mm. Liver: No focal lesion identified. Within normal limits in parenchymal echogenicity. Portal vein is patent on color Doppler imaging with normal direction of blood flow towards the liver. IVC: No abnormality visualized. Pancreas: Visualized portion unremarkable. Spleen: Size and appearance within normal limits. Right Kidney: Length: 11.7 cm. Echogenicity within normal limits. No mass or hydronephrosis visualized. Left Kidney: Length: 11.6 cm. Echogenicity within normal limits. Redemonstration of a 3.2 x 2.5 x 2.9 cm (from 3.7 x 3.6 x 2.2 cm) isoechoic slightly heterogeneous lesion within the superior pole the left kidney. No posterior acoustic enhancement to suggest cystic lesion. No hydronephrosis visualized. Abdominal aorta: No aneurysm visualized. Other findings: None. IMPRESSION: Slightly decreased in size 3.2 x 2.5 x 2.9 cm superior  left renal pole lesion that likely represents an abscess. Please note incomplete characterization on MRI abdomen 03/24/2021 due to noncontrast study. Electronically Signed   By: Tish Frederickson M.D.   On: 03/27/2021 19:13   US OB Follow Up  Result Date: 03/28/2021 CLINICAL DATA:  Follow-up fetal anatomy EXAM: OBSTETRIC 14+ WK ULTRASOUND FOLLOW-UP FINDINGS: Number of Fetuses: 1 Heart Rate:  166 bpm Movement: Yes Presentation: Cephalic Previa: No Placental Location: Posterior Amniotic Fluid (Subjective): Normal Amniotic Fluid (Objective): Vertical pocket 5.3cm AFI: 15.4 cm. FETAL BIOMETRY BPD:  6.8cm 27w 3d HC:    25.5cm 27w 5d AC:    20.1cm 24w 5d FL:    4.8cm 25w 6d Current Mean GA:  26w 0d [place] EDC: 07/04/2021 Assigned GA: 25w 6d Assigned EDC: 07/05/2021 Estimated Fetal Weight:  824g 27%ile FETAL ANATOMY Lateral Ventricles: Previously seen Thalami/CSP: Previously seen Posterior Fossa: Previously seen Nuchal Region: Previously seen Upper Lip: Previously seen Spine: Appears normal 4 Chamber Heart on Left: Previously seen LVOT: Previously seen RVOT: Appears normal Stomach on Left: Previously seen 3 Vessel Cord: Previously seen Cord Insertion site: Previously seen Kidneys: Previously seen. Right renal pelvis measures 5.8 mm in diameter. Mild calyceal dilation. Left renal pelvis measures 2.9 mm in diameter. Bladder: Previously seen Extremities: Previously seen Sex: Previously Seen Technical Limitations: Fetal positioning Maternal Findings: Cervix:  Appears closed. IMPRESSION: 1. Single live intrauterine gestation in cephalic presentation. 2. Assigned gestational age of [redacted] weeks 6 days. Adequate interval growth. 3. Adequate visualization of the fetal spine and RVOT. 4. Mild-moderate right fetal hydronephrosis with right renal pelvis diameter of 5.8 mm and mild caliceal dilation. Unremarkable appearance of the left kidney and bladder. 5. Amniotic fluid index of 15.4 cm, within normal limits. Electronically Signed   By:  Duanne Guess D.O.   On: 03/28/2021 13:14   ECHOCARDIOGRAM COMPLETE  Result Date: 03/27/2021    ECHOCARDIOGRAM REPORT   Patient Name:   [place] R [facility] Date of Exam: 03/27/2021 Medical Rec #:  [id number]          Height:       63.0 in Accession #:    [id number]         Weight:       153.0 lb Date of Birth:  11/07/1985          BSA:          1.726 m Patient Age:    34 years           BP:           94/64 mmHg Patient Gender: F                  HR:           75 bpm. Exam Location:  [facility] Procedure: 2D Echo, Cardiac Doppler and Color Doppler Indications:     Acute respiratory distress R06.03  History:         Patient has no prior history of Echocardiogram examinations.                  Migraines.  Sonographer:     Cristela Blue RDCS (AE) Referring Phys:  [id number] Rosalva Ferron GRAVES Diagnosing Phys: Adrian Blackwater MD  Sonographer Comments: Suboptimal apical window. IMPRESSIONS  1. Left ventricular ejection fraction, by estimation, is 50 to 55%. The left ventricle has low normal function. The left ventricle demonstrates regional wall motion abnormalities (see scoring diagram/findings for description). There is mild concentric left ventricular hypertrophy. Left ventricular diastolic parameters are consistent with Grade II diastolic dysfunction (pseudonormalization).  2. Right ventricular systolic function is normal. The right ventricular size is normal.  3. Left atrial size was moderately dilated.  4. Right atrial size was moderately dilated.  5. The mitral valve is normal in structure. Mild mitral valve regurgitation. No evidence of mitral stenosis.  6. The aortic valve is normal in structure. Aortic valve regurgitation is not visualized. No aortic stenosis is present.  7. The inferior vena cava is normal in size with greater than 50% respiratory variability, suggesting right atrial pressure of 3 mmHg. FINDINGS  Left Ventricle: Left ventricular ejection fraction, by estimation, is 50 to 55%. The left ventricle has low normal  function. The left ventricle demonstrates regional wall motion abnormalities. The left ventricular internal cavity size was normal in size. There is mild concentric left ventricular hypertrophy. Left ventricular diastolic parameters are consistent with Grade II diastolic dysfunction (pseudonormalization). Right Ventricle: The right ventricular size is normal. No increase in right ventricular wall thickness. Right ventricular systolic function is normal. Left Atrium: Left atrial size was moderately dilated. Right Atrium: Right atrial size was moderately dilated. Pericardium: There is no evidence of pericardial effusion. Mitral Valve: The mitral valve is normal in structure. Mild mitral valve regurgitation. No evidence of mitral valve stenosis. Tricuspid Valve: The tricuspid valve is normal in structure. Tricuspid valve regurgitation is mild . No evidence of tricuspid stenosis. Aortic Valve: The aortic valve is normal in structure. Aortic valve regurgitation is not visualized. No aortic stenosis is present. Aortic valve mean gradient measures 3.5 mmHg. Aortic valve peak gradient measures 6.4 mmHg. Aortic valve area, by VTI measures 1.97 cm. Pulmonic Valve: The pulmonic valve was normal in structure. Pulmonic valve regurgitation is not visualized. No evidence of pulmonic stenosis. Aorta: The aortic root is normal in size and structure. Venous: The inferior vena cava is normal in size with greater than 50% respiratory variability, suggesting right atrial pressure of 3 mmHg. IAS/Shunts: No atrial level shunt detected by color flow Doppler.  LEFT VENTRICLE PLAX 2D LVIDd:         4.34 cm  Diastology LVIDs:         3.16 cm  LV e' medial:    10.70 cm/s LV PW:         1.17 cm  LV E/e' medial:  10.1 LV IVS:        0.83 cm  LV e' lateral:   13.30 cm/s LVOT diam:     2.00 cm  LV E/e' lateral: 8.1 LV SV:         48 LV SV Index:   28 LVOT Area:     3.14 cm  RIGHT VENTRICLE RV Basal diam:  3.98 cm RV S prime:     14.40 cm/s TAPSE  (M-mode): 3.6 cm LEFT ATRIUM             Index       RIGHT ATRIUM           Index LA diam:        2.60 cm 1.51 cm/m  RA Area:     27.30 cm LA Vol (A2C):   79.3 ml 45.96 ml/m RA Volume:   101.00 ml 58.53 ml/m LA Vol (A4C):   75.8 ml 43.93 ml/m LA Biplane Vol: 82.7 ml 47.93 ml/m  AORTIC VALVE                   PULMONIC VALVE AV Area (Vmax):    1.82 cm    PV Vmax:        0.85 m/s AV Area (Vmean):   1.85 cm    PV Peak grad:   2.9 mmHg AV Area (VTI):     1.97 cm    RVOT Peak grad: 4 mmHg AV Vmax:           126.50 cm/s AV Vmean:          90.400 cm/s AV VTI:            0.245 m AV Peak Grad:      6.4 mmHg AV Mean Grad:      3.5 mmHg LVOT Vmax:  73.10 cm/s LVOT Vmean:        53.100 cm/s LVOT VTI:          0.154 m LVOT/AV VTI ratio: 0.63  AORTA Ao Root diam: 2.70 cm MITRAL VALVE                TRICUSPID VALVE MV Area (PHT): 3.79 cm     TR Peak grad:   23.8 mmHg MV Decel Time: 200 msec     TR Vmax:        244.00 cm/s MV E velocity: 108.00 cm/s MV A velocity: 60.40 cm/s   SHUNTS MV E/A ratio:  1.79         Systemic VTI:  0.15 m                             Systemic Diam: 2.00 cm Adrian BlackwaterShaukat Khan MD Electronically signed by Adrian BlackwaterShaukat Khan MD Signature Date/Time: 03/27/2021/3:33:00 PM    Final      Assessment and Plan: 35 year old gravid female at [redacted]W[redacted]D admitted with multiple left renal abscesses as well as concern for psoas abscess is also developed a pleural effusion during this admission and underwent thoracentesis 3 days ago.  Recommendations: -Continue IV ceftriaxone 2 g daily to total 4 weeks of therapy -ID and discussion with IR to see if any of the renal abscesses are amenable to aspiration -Continue to follow pleural fluid cultures -Continue to trend serum creatinine and CBC -Continues with clinically, but if renal abscesses are not amenable to aspiration consider repeat imaging if her clinical condition does not improve or worsens -Continue pain control as per OB/GYN recommendations     LOS: 4  days    [place] Day[facility]ANNON [facility] 03/29/2021

## 2021-03-29 NOTE — Progress Notes (Signed)
FHT 150 with doppler. Patient denies any ctx, bleeding or LOF.

## 2021-03-29 NOTE — Progress Notes (Signed)
Date of Admission:  03/24/2021     ID: Laura Sosa is a 35 y.o. female  Principal Problem:   Shortness of breath Active Problems:   History of pyelonephritis   Hyperemesis gravidarum   Hyperemesis affecting pregnancy, antepartum   Pregnancy   Anemia of pregnancy in second trimester   Renal abscess   Pleural effusion   Status post thoracentesis    Subjective: Complains of left shoulder pain Some difficulty in breathing Some chest pain No flank pain No fever   Medications:   Chlorhexidine Gluconate Cloth  6 each Topical Daily   enoxaparin (LOVENOX) injection  40 mg Subcutaneous Q24H   feeding supplement  1 Container Oral TID BM   potassium chloride  40 mEq Oral Once   prenatal multivitamin  1 tablet Oral Q1200    Objective: Vital signs in last 24 hours: Temp:  [97.7 F (36.5 C)-98.2 F (36.8 C)] 97.9 F (36.6 C) (08/03 1500) Pulse Rate:  [65-101] 76 (08/03 1500) Resp:  [18-30] 21 (08/03 1500) BP: (88-102)/(54-80) 96/59 (08/03 1500) SpO2:  [82 %-97 %] 93 % (08/03 1500)  PHYSICAL EXAM:  General: Alert, cooperative, minimal distress at rest, appears stated age.  Head: Normocephalic, without obvious abnormality, atraumatic. Eyes: Conjunctivae clear, anicteric sclerae. Pupils are equal ENT Nares normal. No drainage or sinus tenderness. Lips, mucosa, and tongue normal. No Thrush Neck: Supple, symmetrical, no adenopathy, thyroid: non tender no carotid bruit and no JVD. Back: No CVA tenderness. Lungs: Bilateral air entry. Decreased in the left base. Heart: Regular rate and rhythm, no murmur, rub or gallop. Abdomen: Soft, non-tender,not distended. Bowel sounds normal. No masses.  No flank tenderness  Uterus palpable midway between the umbilicus and suprapubic area Extremities: atraumatic, no cyanosis. No edema. No clubbing Skin: No rashes or lesions. Or bruising.  Covered with tattoos Lymph: Cervical, supraclavicular normal. Neurologic: Grossly  non-focal  Lab Results Recent Labs    03/28/21 0415  WBC 9.2  HGB 9.3*  HCT 28.8*  NA 132*  K 3.3*  CL 101  CO2 23  BUN 7  CREATININE 0.76   Liver Panel Recent Labs    03/27/21 0953  PROT 6.5  ALBUMIN 2.1*  AST 92*  ALT 147*  ALKPHOS 106  BILITOT 1.2  BILIDIR 0.6*  IBILI 0.6    Microbiology:  Studies/Results: US Abdomen Complete  Result Date: 03/27/2021 CLINICAL DATA:  Sepsis. EXAM: ABDOMEN ULTRASOUND COMPLETE COMPARISON:  CT angiography chest 03/25/2021, ultrasound renal 03/25/2021, MRI abdomen 03/24/2021 FINDINGS: Gallbladder: No gallstones or wall thickening visualized. No sonographic Murphy sign noted by sonographer. Common bile duct: Diameter: 3 mm. Liver: No focal lesion identified. Within normal limits in parenchymal echogenicity. Portal vein is patent on color Doppler imaging with normal direction of blood flow towards the liver. IVC: No abnormality visualized. Pancreas: Visualized portion unremarkable. Spleen: Size and appearance within normal limits. Right Kidney: Length: 11.7 cm. Echogenicity within normal limits. No mass or hydronephrosis visualized. Left Kidney: Length: 11.6 cm. Echogenicity within normal limits. Redemonstration of a 3.2 x 2.5 x 2.9 cm (from 3.7 x 3.6 x 2.2 cm) isoechoic slightly heterogeneous lesion within the superior pole the left kidney. No posterior acoustic enhancement to suggest cystic lesion. No hydronephrosis visualized. Abdominal aorta: No aneurysm visualized. Other findings: None. IMPRESSION: Slightly decreased in size 3.2 x 2.5 x 2.9 cm superior left renal pole lesion that likely represents an abscess. Please note incomplete characterization on MRI abdomen 03/24/2021 due to noncontrast study. Electronically Signed   By: Blanchie Serve  Tessie Fass M.D.   On: 03/27/2021 19:13   US OB Follow Up  Result Date: 03/28/2021 CLINICAL DATA:  Follow-up fetal anatomy EXAM: OBSTETRIC 14+ WK ULTRASOUND FOLLOW-UP FINDINGS: Number of Fetuses: 1 Heart Rate:  166 bpm  Movement: Yes Presentation: Cephalic Previa: No Placental Location: Posterior Amniotic Fluid (Subjective): Normal Amniotic Fluid (Objective): Vertical pocket 5.3cm AFI: 15.4 cm. FETAL BIOMETRY BPD:  6.8cm 27w 3d HC:    25.5cm 27w 5d AC:    20.1cm 24w 5d FL:    4.8cm 25w 6d Current Mean GA: 26w 0d [place] EDC: 07/04/2021 Assigned GA: 25w 6d Assigned EDC: 07/05/2021 Estimated Fetal Weight:  824g 27%ile FETAL ANATOMY Lateral Ventricles: Previously seen Thalami/CSP: Previously seen Posterior Fossa: Previously seen Nuchal Region: Previously seen Upper Lip: Previously seen Spine: Appears normal 4 Chamber Heart on Left: Previously seen LVOT: Previously seen RVOT: Appears normal Stomach on Left: Previously seen 3 Vessel Cord: Previously seen Cord Insertion site: Previously seen Kidneys: Previously seen. Right renal pelvis measures 5.8 mm in diameter. Mild calyceal dilation. Left renal pelvis measures 2.9 mm in diameter. Bladder: Previously seen Extremities: Previously seen Sex: Previously Seen Technical Limitations: Fetal positioning Maternal Findings: Cervix:  Appears closed. IMPRESSION: 1. Single live intrauterine gestation in cephalic presentation. 2. Assigned gestational age of [redacted] weeks 6 days. Adequate interval growth. 3. Adequate visualization of the fetal spine and RVOT. 4. Mild-moderate right fetal hydronephrosis with right renal pelvis diameter of 5.8 mm and mild caliceal dilation. Unremarkable appearance of the left kidney and bladder. 5. Amniotic fluid index of 15.4 cm, within normal limits. Electronically Signed   By: Duanne Guess D.O.   On: 03/28/2021 13:14     Assessment/Plan: Left renal pyelonephritis with multiple abscesses secondary to renal stone which has been lithotripsied. E. coli in the last urine culture pansensitive We will treat her with 2 g of IV ceftriaxone for nearly 4 weeks until resolution of the renal abscesses. Discussed with interventional radiologist to see whether the abscess could  be aspirated.  It is just phlegmon and will not be amenable to aspiration. We will repeat an ultrasound early next week to see for improvement  Left pleural effusion with hypoxia Underwent pleurocentesis.  Exudative.  Culture negative so far  Anemia  Abnormal LFTs  26-week pregnant  Discussed the management with the patient and the hospitalist.

## 2021-03-29 NOTE — Progress Notes (Signed)
NAME:  Laura Sosa, MRN:  [id number], DOB:  [birth year], LOS: 4 ADMISSION DATE:  03/24/2021, CONSULTATION DATE:  03/26/2021 REFERRING MD:  Dr. Hal Hope, CHIEF COMPLAINT:   LEFT flank pain  History of Present Illness:  35 yo F, who is [redacted] weeks pregnant, presented to [facility] for admission on 03/24/21 by OB-gyn at urology's request to assist with pain management and further evaluation of possible abscess & antibiotic coverage in the setting of continued LEFT flank pain/ pyuria/ hematuria. The patient underwent ureteroscopy/cystoscopy & laser management of ureteral calculi with subsequent stent placement by Dr. Erlene Quan on 03/06/21. Patient received 9 doses of ceftriaxone outpatient without symptom relief.  Hospital course: Urology is consulted for management of ureteral abscess with OB-gyn monitoring fetal heart rate & fetal well being.  MRI without contrast 03/24/2021 >>revealed multiple left renal lesions measuring up to 4 cm similar to previous ultrasound with associated perinephric soft tissue stranding suspicious for pyelonephritis and multiple renal abscesses. Left pleural effusion. Renal ultrasound 03/25/2021>> revealed a 3.7 cm left upper pole intrarenal isoechoic collection compatible with evolving abscess.  Per urology's assessment the ultrasound also showed that previous adjacent left upper pole hypoechoic fluid collection appears to have dispersed into the perinephric and retroperitoneal space when compared to the MRI.  Negative for hydronephrosis. Dr. Reesa Chew with interventional radiology was consulted and there is no collection that is amenable to aspiration.  Dr. Gale Journey with infectious disease was consulted and recommended to continue ceftriaxone IV 2 g daily.    TRH service was consulted on 03/25/2021 due to complaints of shortness of breath with exertion and movement.  Patient was worked up for pulmonary embolus.   CT angio chest 03/25/2021 >> negative for PE, but does show moderate to large left  pleural effusion significantly increased from prior exam with adjacent compressive atelectasis.  Mild cardiomegaly and trace pericardial effusion.  On 03/26/2021 Dr. Reesa Chew with IR performed a left thoracentesis and removed 575 mL of pleural fluid.   Postthoracentesis patient reported continued shortness of breath with tachypnea as well as pain in her left shoulder refractory to IV Dilaudid and morphine. CXR 03/26/21>> (POST- thoracentesis) persistent moderate left pleural effusion with possible underlying consolidation versus atelectasis.  No pneumothorax. Due to the patient's ongoing tachypnea, uncontrolled pain and concerns for respiratory distress patient was transferred to stepdown unit with PCCM consulted for further management and monitoring.  Pertinent  Medical History  Bilateral kidney stones Pyelonephritis  Significant Hospital Events: Including procedures, antibiotic start and stop dates in addition to other pertinent events   03/24/21- admit to Mother/baby by OB-gyn for pain control and IV antibiotics in the setting of suspected pyelonephritis with possible abscess 03/25/21- Urology & ID consulted.  Recommendations for Rocephin coverage, IR consulted but no collection amenable to aspiration.  TRH consulted for shortness of breath, work-up for PE negative but did reveal large pleural effusion. 03/26/21-IR consulted and left thoracentesis performed removing 575 mL of pleural fluid.  Later in evening transferred to ICU with concerns of respiratory distress in the setting of poor pain control and pleural effusion.  PCCM consulted. 03/27/21- patient improved clinically smiling during evaluation. Complete US abdomen today.  Patient requesting frequent IV narcotics 03/28/21- patient had abnormal TTE , cardio consult in progress, ID team to evaluate patient today.  Overnight patient requested more IV narcotics for breakthrough pain and received fentanyl.  Repeat US improved abcess.  03/29/21- patient had  her analgesia increased to diladid today.  I met with OB doc this am  briefly.  Patient was in no distress.  S/p ID evaluation .  PCCM will sign off as patient is stable on room air now  Interim History / Subjective: Labs/ Imaging personally reviewed > see descriptions of CT/ultrasound/CT angio/chest x-ray above in HPI.  Postthoracentesis patient reports increased muscular pain on left side.  She reports coughing and feeling as though her "entire back locked up", she is unable to raise her arm over her head due to shoulder pain, and cannot lay on her left side.  This pain is reproducible & 10/10, being poorly controlled with dilaudid & morphine. Patient states rest helps somewhat, but does feels sharp spasms intermittently and with exertion. Patient is tachypneic but able to speak in complete sentences. SpO2 has remained stable on 2 L [place].  - Pleural fluid analysis indicative of an exudative effusion, possibly para pneumonic. No pleural protein or pH to review, cytology and gram stain pending.  Objective   Blood pressure 102/61, pulse 75, temperature 98 F (36.7 C), temperature source Axillary, resp. rate 19, height $RemoveBe'5\' 3"'jpbyjJkok$  (1.6 m), weight 69.4 kg, SpO2 94 %.        Intake/Output Summary (Last 24 hours) at 03/29/2021 0944 Last data filed at 03/29/2021 0800 Gross per 24 hour  Intake 578.24 ml  Output 550 ml  Net 28.24 ml    Filed Weights   03/24/21 2100  Weight: 69.4 kg    Examination: General: Adult female, acutely ill, lying in bed, NAD HEENT: MM pink/moist, anicteric, atraumatic, neck supple Neuro: A&O x 4, follows commands, PERRL +3, MAE CV: s1s2 RRR, NSR on monitor, no r/m/g Pulm: Regular, tachypneic, mild dyspnea on 2 L [place], breath sounds clear-BUL & diminished-BLL  GI: soft, rounded-[redacted] weeks pregnant, non tender, bs x 4 Skin: no rashes/lesions noted Extremities: warm/dry, pulses + 2 R/P, no edema noted  Resolved Hospital Problem list     Assessment & Plan:  Moderate LEFT pleural  effusion secondary to suspected cardiac dysfunction in context of active pregnancy vs parapneumonic effusion Pleural fluid analysis missing protein & pH but per Light's Criteria is indicative of an exudative process. TNC elevated > 8,000 with CXR revealing potential underlying consolidation is suspicious for para pneumonic effusion. Currently receiving ceftriaxone for pyelonephritis - f/u Gram stain & cultures - f/u cytology - pulmonary toileting as tolerated - supplemental O2 PRN to maintain SpO2 >90%  Muscle spasms post Thoracentesis  Query nerve irritation post procedure? Patient describes this pain as different than earlier LEFT flank pain on admission. She describes pain as sharp/sore, unable to lift Left arm higher than shoulder level or lay on LEFT side. She reported coughing and feeling her "back lock up".  She reports dilaudid helps "for a little while" & morphine seems ineffective.  -reduce narcotics  -[place] abd -empiric abx - TTE -BNP -trial of lasix to conisder- patient had hypotensive episodes from renal infection   Left pyelonephritis with left renal abscess Managed by urology & ID - continue ceftriaxone 2 g daily  Best Practice (right click and "Reselect all SmartList Selections" daily)  Diet/type: Regular consistency (see orders) DVT prophylaxis: SCD GI prophylaxis: N/A Lines: N/A Foley:  N/A Code Status:  full code Last date of multidisciplinary goals of care discussion [03/26/21]  Labs   CBC: Recent Labs  Lab 03/25/21 0906 03/28/21 0415  WBC 9.4 9.2  NEUTROABS  --  7.1  HGB 10.2* 9.3*  HCT 31.2* 28.8*  MCV 89.1 91.4  PLT 408* 419*     Basic Metabolic  Panel: Recent Labs  Lab 03/25/21 0906 03/26/21 0651 03/27/21 0953 03/28/21 0415  NA 134*  --   --  132*  K 3.2*  --   --  3.3*  CL 100  --   --  101  CO2 24  --   --  23  GLUCOSE 77  --   --  76  BUN 7  --   --  7  CREATININE 0.57 0.60  --  0.76  CALCIUM 8.6*  --   --  8.0*  MG  --   --  1.7 1.6*   PHOS  --   --  3.3 2.9    GFR: Estimated Creatinine Clearance: 94.4 mL/min (by C-G formula based on SCr of 0.76 mg/dL). Recent Labs  Lab 03/25/21 0906 03/25/21 1332 03/27/21 0953 03/28/21 0415  PROCALCITON  --  0.16 0.29  0.30 0.50  WBC 9.4  --   --  9.2     Liver Function Tests: Recent Labs  Lab 03/27/21 0953  AST 92*  ALT 147*  ALKPHOS 106  BILITOT 1.2  PROT 6.5  ALBUMIN 2.1*    No results for input(s): LIPASE, AMYLASE in the last 168 hours. No results for input(s): AMMONIA in the last 168 hours.  ABG    Component Value Date/Time   PHART 7.45 03/26/2021 1800   PCO2ART 34 03/26/2021 1800   PO2ART 92 03/26/2021 1800   HCO3 23.6 03/26/2021 1800   ACIDBASEDEF 0.0 03/26/2021 1800   O2SAT 97.5 03/26/2021 1800      Coagulation Profile: Recent Labs  Lab 03/25/21 0906  INR 1.0     Cardiac Enzymes: No results for input(s): CKTOTAL, CKMB, CKMBINDEX, TROPONINI in the last 168 hours.  HbA1C: No results found for: HGBA1C  CBG: Recent Labs  Lab 03/26/21 2117  GLUCAP 94     Review of Systems: Positives in BOLD  Gen: Denies fever, chills, weight change, fatigue, night sweats HEENT: Denies blurred vision, double vision, hearing loss, tinnitus, sinus congestion, rhinorrhea, sore throat, neck stiffness, dysphagia PULM: Denies shortness of breath, cough, sputum production, hemoptysis, wheezing CV: Denies chest pain, edema, orthopnea, paroxysmal nocturnal dyspnea, palpitations GI: Denies abdominal pain, nausea, vomiting, diarrhea, hematochezia, melena, constipation, change in bowel habits GU: Denies dysuria, hematuria, polyuria, oliguria, urethral discharge Endocrine: Denies hot or cold intolerance, polyuria, polyphagia or appetite change Derm: Denies rash, dry skin, scaling or peeling skin change Heme: Denies easy bruising, bleeding, bleeding gums Neuro: Denies headache, numbness, weakness, slurred speech, loss of memory or consciousness MUSK: pain &  tenderness in LEFT shoulder, back & down LEFT side around thoracentesis site Past Medical History:  She,  has a past medical history of Bilateral kidney stones, Headache, History of kidney stones, Migraine, and Pyelonephritis (04/17/2016).   Surgical History:   Past Surgical History:  Procedure Laterality Date   COLPOSCOPY     CYSTOSCOPY WITH STENT PLACEMENT Left 02/26/2021   Procedure: CYSTOSCOPY WITH STENT PLACEMENT;  Surgeon: Lucas Mallow, MD;  Location: [facility] ORS;  Service: Urology;  Laterality: Left;   CYSTOSCOPY/RETROGRADE/URETEROSCOPY  03/15/2017   Procedure: CYSTOSCOPY/RETROGRADE/URETEROSCOPY;  Surgeon: Nickie Retort, MD;  Location: [facility] OR[facility];  Service: Urology;;   CYSTOSCOPY/URETEROSCOPY/HOLMIUM LASER/STENT PLACEMENT Left 03/06/2021   Procedure: CYSTOSCOPY/URETEROSCOPY/HOLMIUM LASER/STENT PLACEMENT;  Surgeon: Hollice Espy, MD;  Location: [facility] ORS;  Service: Urology;  Laterality: Left;   LEEP       Social History:   reports that she has never smoked. She has never used smokeless tobacco. She reports  that she does not drink alcohol and does not use drugs.   Family History:  Her family history includes Asthma in her daughter; Diabetes in her father and mother; Emphysema in her maternal grandmother; Heart defect in her half-sister; Hypertension in her father, mother, and paternal grandmother; Lung cancer in her maternal grandmother. There is no history of Kidney cancer, Bladder Cancer, Breast cancer, Ovarian cancer, or Colon cancer.   Allergies No Known Allergies   Home Medications  Prior to Admission medications   Medication Sig Start Date End Date Taking? Authorizing Provider  acetaminophen (TYLENOL) 325 MG tablet Take 2 tablets (650 mg total) by mouth every 4 (four) hours as needed (for pain scale < 4  OR  temperature  >/=  100.5 F). 03/08/21   Rubie Maid, MD  cyclobenzaprine (FLEXERIL) 10 MG tablet Take 1 tablet (10 mg total) by mouth 3 (three) times daily as  needed for muscle spasms. 03/23/21   Rubie Maid, MD  docusate sodium (COLACE) 100 MG capsule Take 1 capsule (100 mg total) by mouth 2 (two) times daily as needed for mild constipation. 03/08/21   Rubie Maid, MD  ferrous sulfate 325 (65 FE) MG tablet Take 1 tablet (325 mg total) by mouth 2 (two) times daily with a meal. 03/08/21   Rubie Maid, MD  ondansetron (ZOFRAN-ODT) 4 MG disintegrating tablet Take 1 tablet (4 mg total) by mouth every 8 (eight) hours as needed for nausea or vomiting. 03/08/21   Rubie Maid, MD  Prenatal Vit-Fe Fumarate-FA (MULTIVITAMIN-PRENATAL) 27-0.8 MG TABS tablet Take 1 tablet by mouth daily at 12 noon.    [provider]  promethazine (PHENERGAN) 12.5 MG suppository Place rectally. 03/20/21 03/27/21  [provider]     Critical care provider statement:   Total critical care time: 33 minutes   Performed by: Lanney Gins MD   Critical care time was exclusive of separately billable procedures and treating other patients.   Critical care was necessary to treat or prevent imminent or life-threatening deterioration.   Critical care was time spent personally by me on the following activities: development of treatment plan with patient and/or surrogate as well as nursing, discussions with consultants, evaluation of patient's response to treatment, examination of patient, obtaining history from patient or surrogate, ordering and performing treatments and interventions, ordering and review of laboratory studies, ordering and review of radiographic studies, pulse oximetry and re-evaluation of patient's condition.    Ottie Glazier, M.D.  Pulmonary & Critical Care Medicine

## 2021-03-29 NOTE — Progress Notes (Signed)
PROGRESS NOTE  Consult progress note.  Laura R Remus  JJO:[id number] DOB: 1986-03-02 DOA: 03/24/2021 PCP: [place] [place]   Brief Narrative: Taken from consult note. Laura Sosa is a 35 y.o. female with medical history significant for history of bilateral kidney stones, migraine headaches, pyelonephritis, G3P2002, [redacted] weeks pregnant, who was admitted to the hospital under obstetrics and gynecologic service for left flank pain, pyuria, hematuria, pain management and IV antibiotics.   She was recently treated for urinary tract infection and left renal abscess failing outpatient therapy with 9 doses of Rocephin outpatient.  She is status post left ureteral stent exchange on 03/06/2021 and subsequent stent removal on 03/08/2021.  Per ID she will need 3 to 4 weeks of antibiotics for this persistent infection.  Imaging without any new abscess.   Hospitalist service was consulted for shortness of breath, worse with exertion.  Found to have large left pleural effusion-thoracentesis with removal of 500 cc of exudative fluid, cultures pending.  Developed postthoracentesis left-sided pain and spasms along with some shortness of breath-transfer to stepdown overnight due to worsening pain and dyspnea.  Repeat chest x-ray with moderate left-sided pleural effusion, looks pretty much the same as before thoracentesis.  No pneumothorax.  Subjective: Pt complained of pain in her left shoulder.  Also unable to take pills even when crushed mixed in sauce.  Able to eat and drink a little.   Assessment & Plan:   Principal Problem:   Shortness of breath Active Problems:   History of pyelonephritis   Hyperemesis gravidarum   Hyperemesis affecting pregnancy, antepartum   Pregnancy   Anemia of pregnancy in second trimester   Renal abscess   Pleural effusion   Status post thoracentesis  Exertional dyspnea.   Multifactorial with current pregnancy, dilutional anemia and left pleural effusion.   Troponin, BNP within normal limit.  Procalcitonin at 0.16, CRP of 5.7 and ESR of 85 with history of recent left renal abscess and urologic procedures.  D-dimer was mildly elevated but CTA was negative for PE. --transferred to stepdown for worsening dyspnea.  Currently seems improving, weaned off from oxygen.    Exudative pleural effusion on the left Thoracentesis was done, initial labs with exudative fluid.  Cultures negative so far. --likely reactive due to pyelo --avoid further thoracentesis  severe left-sided chest pain/spasms  Left shoulder pain --developed after thoracentesis, maybe hit a nerve. Plan: --avoid IV pain meds --oral liquid dilaudid --start liquid gabapentin 300 mg TID  Hypomagnesemia/hypokalemia.   Might be responsible for this spasms. Monitor and replete PRN  History of recent left renal abscess/pyelonephritis/nephrolithiasis.  Patient had recent lithotripsy done, also has a stent placement followed by removal due to worsening pain.  ID and urology is also following. --no drainable collection, per IR Plan: --ceftriaxone 2g for 4 weeks, per ID, till resolution of the renal abscess.  Fetal monitoring. -Per OB/GYN.   DVT prophylaxis: Lovenox SQ Code Status: Full code  Family Communication:  Status is: inpatient Dispo:   The patient is from: home Anticipated d/c is to: home Anticipated d/c date is: 2-3 days Patient currently is not medically stable to d/c due to: unable to swallow normally due to spasm   Objective: Vitals:   03/29/21 1000 03/29/21 1106 03/29/21 1200 03/29/21 1500  BP: (!) 90/57 95/68 (!) 91/58 (!) 96/59  Pulse: 78 84 65 76  Resp: (!) 21 (!) 23 19 (!) 21  Temp:    97.9 F (36.6 C)  TempSrc:    Oral  SpO2: 95% 95% 95% 93%  Weight:      Height:        Intake/Output Summary (Last 24 hours) at 03/29/2021 1641 Last data filed at 03/29/2021 1555 Gross per 24 hour  Intake 1821.72 ml  Output 650 ml  Net 1171.72 ml   Filed Weights    03/24/21 2100  Weight: 69.4 kg    Examination:  Constitutional: NAD, AAOx3 HEENT: conjunctivae and lids normal, EOMI CV: No cyanosis.   RESP: normal respiratory effort, on RA Extremities: No effusions, edema in BLE SKIN: warm, dry Neuro: II - XII grossly intact.   Psych: depressed mood and affect.      All the records are reviewed and case discussed with Care Management/Social Worker. Management plans discussed with the patient, nursing and they are in agreement.   Procedures:  Thoracentesis  Antimicrobials:  Ceftriaxone  Data Reviewed: I have personally reviewed following labs and imaging studies  CBC: Recent Labs  Lab 03/25/21 0906 03/28/21 0415  WBC 9.4 9.2  NEUTROABS  --  7.1  HGB 10.2* 9.3*  HCT 31.2* 28.8*  MCV 89.1 91.4  PLT 408* 564*   Basic Metabolic Panel: Recent Labs  Lab 03/25/21 0906 03/26/21 0651 03/27/21 0953 03/28/21 0415  NA 134*  --   --  132*  K 3.2*  --   --  3.3*  CL 100  --   --  101  CO2 24  --   --  23  GLUCOSE 77  --   --  76  BUN 7  --   --  7  CREATININE 0.57 0.60  --  0.76  CALCIUM 8.6*  --   --  8.0*  MG  --   --  1.7 1.6*  PHOS  --   --  3.3 2.9   GFR: Estimated Creatinine Clearance: 94.4 mL/min (by C-G formula based on SCr of 0.76 mg/dL). Liver Function Tests: Recent Labs  Lab 03/27/21 0953  AST 92*  ALT 147*  ALKPHOS 106  BILITOT 1.2  PROT 6.5  ALBUMIN 2.1*   No results for input(s): LIPASE, AMYLASE in the last 168 hours. No results for input(s): AMMONIA in the last 168 hours. Coagulation Profile: Recent Labs  Lab 03/25/21 0906  INR 1.0   Cardiac Enzymes: No results for input(s): CKTOTAL, CKMB, CKMBINDEX, TROPONINI in the last 168 hours. BNP (last 3 results) No results for input(s): PROBNP in the last 8760 hours. HbA1C: No results for input(s): HGBA1C in the last 72 hours. CBG: Recent Labs  Lab 03/26/21 2117  GLUCAP 94   Lipid Profile: No results for input(s): CHOL, HDL, LDLCALC, TRIG, CHOLHDL,  LDLDIRECT in the last 72 hours. Thyroid Function Tests: No results for input(s): TSH, T4TOTAL, FREET4, T3FREE, THYROIDAB in the last 72 hours.  Anemia Panel: No results for input(s): VITAMINB12, FOLATE, FERRITIN, TIBC, IRON, RETICCTPCT in the last 72 hours.  Sepsis Labs: Recent Labs  Lab 03/25/21 1332 03/27/21 0953 03/28/21 0415  PROCALCITON 0.16 0.29  0.30 0.50    Recent Results (from the past 240 hour(s))  Microscopic Examination     Status: Abnormal   Collection Time: 03/24/21 12:59 PM   Urine  Result Value Ref Range Status   WBC, UA >30 (A) 0 - 5 /hpf Final   RBC 11-30 (A) 0 - 2 /hpf Final   Epithelial Cells (non renal) 0-10 0 - 10 /hpf Final   Casts Present (A) None seen /lpf Final   Cast Type Hyaline casts N/A  Final   Mucus, UA Present (A) Not Estab. Final   Bacteria, UA Moderate (A) None seen/Few Final  CULTURE, URINE COMPREHENSIVE     Status: None   Collection Time: 03/24/21  1:50 PM   Specimen: Urine   UR  Result Value Ref Range Status   Urine Culture, Comprehensive Final report  Final   Organism ID, Bacteria Comment  Final    Comment: No growth in 36 - 48 hours.  SARS CORONAVIRUS 2 (TAT 6-24 HRS) Nasopharyngeal Nasopharyngeal Swab     Status: None   Collection Time: 03/24/21  6:15 PM   Specimen: Nasopharyngeal Swab  Result Value Ref Range Status   SARS Coronavirus 2 NEGATIVE NEGATIVE Final    Comment: (NOTE) SARS-CoV-2 target nucleic acids are NOT DETECTED.  The SARS-CoV-2 RNA is generally detectable in upper and lower respiratory specimens during the acute phase of infection. Negative results do not preclude SARS-CoV-2 infection, do not rule out co-infections with other pathogens, and should not be used as the sole basis for treatment or other patient management decisions. Negative results must be combined with clinical observations, patient history, and epidemiological information. The expected result is Negative.  Fact Sheet for  Patients: SugarRoll.be  Fact Sheet for Healthcare Providers: https://www.woods-mathews.com/  This test is not yet approved or cleared by the [place] FDA and  has been authorized for detection and/or diagnosis of SARS-CoV-2 by FDA under an Emergency Use Authorization (EUA). This EUA will remain  in effect (meaning this test can be used) for the duration of the COVID-19 declaration under Se ction 564(b)(1) of the Act, 21 U.S.C. section 360bbb-3(b)(1), unless the authorization is terminated or revoked sooner.  Performed at [facility], [place] [address], [place], [place] [address]   Urine Culture     Status: Abnormal   Collection Time: 03/24/21 10:49 PM   Specimen: Urine, Clean Catch  Result Value Ref Range Status   Specimen Description   Final    URINE, CLEAN CATCH Performed at [facility], [address] [place], [place] [address]    Special Requests   Final    NONE Performed at [facility], [place], [place] [address]    Culture 20,000 COLONIES/mL YEAST (A)  Final   Report Status 03/26/2021 FINAL  Final  Culture, blood (routine x 2)     Status: None (Preliminary result)   Collection Time: 03/25/21  9:06 AM   Specimen: BLOOD RIGHT HAND  Result Value Ref Range Status   Specimen Description BLOOD RIGHT HAND  Final   Special Requests   Final    BOTTLES DRAWN AEROBIC AND ANAEROBIC Blood Culture adequate volume   Culture   Final    NO GROWTH 4 DAYS Performed at [facility], [address] [place], [place] [address]    Report Status PENDING  Incomplete  Culture, blood (routine x 2)     Status: None (Preliminary result)   Collection Time: 03/25/21  9:20 AM   Specimen: BLOOD LEFT HAND  Result Value Ref Range Status   Specimen Description BLOOD LEFT HAND  Final   Special Requests   Final    BOTTLES DRAWN AEROBIC AND ANAEROBIC Blood Culture adequate volume   Culture   Final     NO GROWTH 4 DAYS Performed at [facility], [place], [place] [address]    Report Status PENDING  Incomplete  Acid Fast Smear (AFB)     Status: None  Collection Time: 03/26/21 10:30 AM   Specimen: PATH Cytology Pleural fluid  Result Value Ref Range Status   AFB Specimen Processing Concentration  Final   Acid Fast Smear Negative  Final    Comment: (NOTE) Performed At: [facility], [place] [id number] Rush Farmer MD UE:[id number]    Source (AFB) PLEURAL  Final    Comment: Performed at [facility], [address] [place], [place] [address]  Body fluid culture w Gram Stain     Status: None (Preliminary result)   Collection Time: 03/26/21 10:30 AM   Specimen: PATH Cytology Pleural fluid  Result Value Ref Range Status   Specimen Description   Final    PLEURAL Performed at [facility], [address], [place], [place] [address]    Special Requests   Final    PLEURAL Performed at [facility], [address] [place], [place] [address]    Gram Stain   Final    RARE WBC PRESENT,BOTH PMN AND MONONUCLEAR NO ORGANISMS SEEN    Culture   Final    NO GROWTH 3 DAYS Performed at [facility], [place] [address] [place], [place] [address]    Report Status PENDING  Incomplete  MRSA Next Gen by PCR, Nasal     Status: None   Collection Time: 03/26/21 10:06 PM   Specimen: Nasal Mucosa; Nasal Swab  Result Value Ref Range Status   MRSA by PCR Next Gen NOT DETECTED NOT DETECTED Final    Comment: (NOTE) The GeneXpert MRSA Assay (FDA approved for NASAL specimens only), is one component of a comprehensive MRSA colonization surveillance program. It is not intended to diagnose MRSA infection nor to guide or monitor treatment for MRSA infections. Test performance is not FDA approved in patients less than 48 years old. Performed at [facility], [address] [place], [place] [address]        Radiology Studies: US Abdomen Complete  Result Date: 03/27/2021 CLINICAL DATA:  Sepsis. EXAM: ABDOMEN ULTRASOUND COMPLETE COMPARISON:  CT angiography chest 03/25/2021, ultrasound renal 03/25/2021, MRI abdomen 03/24/2021 FINDINGS: Gallbladder: No gallstones or wall thickening visualized. No sonographic Murphy sign noted by sonographer. Common bile duct: Diameter: 3 mm. Liver: No focal lesion identified. Within normal limits in parenchymal echogenicity. Portal vein is patent on color Doppler imaging with normal direction of blood flow towards the liver. IVC: No abnormality visualized. Pancreas: Visualized portion unremarkable. Spleen: Size and appearance within normal limits. Right Kidney: Length: 11.7 cm. Echogenicity within normal limits. No mass or hydronephrosis visualized. Left Kidney: Length: 11.6 cm. Echogenicity within normal limits. Redemonstration of a 3.2 x 2.5 x 2.9 cm (from 3.7 x 3.6 x 2.2 cm) isoechoic slightly heterogeneous lesion within the superior pole the left kidney. No posterior acoustic enhancement to suggest cystic lesion. No hydronephrosis visualized. Abdominal aorta: No aneurysm visualized. Other findings: None. IMPRESSION: Slightly decreased in size 3.2 x 2.5 x 2.9 cm superior left renal pole lesion that likely represents an abscess. Please note incomplete characterization on MRI abdomen 03/24/2021 due to noncontrast study. Electronically Signed   By: Iven Finn M.D.   On: 03/27/2021 19:13   US OB Follow Up  Result Date: 03/28/2021 CLINICAL DATA:  Follow-up fetal anatomy EXAM: OBSTETRIC 14+ WK ULTRASOUND FOLLOW-UP FINDINGS: Number of Fetuses: 1 Heart Rate:  166 bpm Movement: Yes Presentation: Cephalic Previa: No Placental Location: Posterior Amniotic Fluid (Subjective): Normal Amniotic Fluid (Objective): Vertical pocket 5.3cm AFI: 15.4 cm. FETAL BIOMETRY BPD:  6.8cm 27w 3d HC:    25.5cm 27w 5d AC:    20.1cm 24w 5d FL:    4.8cm 25w 6d Current Mean GA: 26w 0d [place] EDC: 07/04/2021  Assigned GA: 25w 6d Assigned EDC: 07/05/2021 Estimated Fetal Weight:  824g 27%ile FETAL ANATOMY Lateral Ventricles: Previously seen Thalami/CSP: Previously seen Posterior Fossa: Previously seen Nuchal Region: Previously seen Upper Lip: Previously seen Spine: Appears normal 4 Chamber Heart on Left: Previously seen LVOT: Previously seen RVOT: Appears normal Stomach on Left: Previously seen 3 Vessel Cord: Previously seen Cord Insertion site: Previously seen Kidneys: Previously seen. Right renal pelvis measures 5.8 mm in diameter. Mild calyceal dilation. Left renal pelvis measures 2.9 mm in diameter. Bladder: Previously seen Extremities: Previously seen Sex: Previously Seen Technical Limitations: Fetal positioning Maternal Findings: Cervix:  Appears closed. IMPRESSION: 1. Single live intrauterine gestation in cephalic presentation. 2. Assigned gestational age of [redacted] weeks 6 days. Adequate interval growth. 3. Adequate visualization of the fetal spine and RVOT. 4. Mild-moderate right fetal hydronephrosis with right renal pelvis diameter of 5.8 mm and mild caliceal dilation. Unremarkable appearance of the left kidney and bladder. 5. Amniotic fluid index of 15.4 cm, within normal limits. Electronically Signed   By: Davina Poke D.O.   On: 03/28/2021 13:14    Scheduled Meds:  Chlorhexidine Gluconate Cloth  6 each Topical Daily   enoxaparin (LOVENOX) injection  40 mg Subcutaneous Q24H   feeding supplement  1 Container Oral TID BM   potassium chloride  40 mEq Oral Once   prenatal multivitamin  1 tablet Oral Q1200   Continuous Infusions:  sodium chloride Stopped (03/26/21 2101)   cefTRIAXone (ROCEPHIN)  IV Stopped (03/29/21 0925)     LOS: 4 days    Enzo Bi, MD [facility]  If 7PM-7AM, please contact night-coverage [email or web address]  03/29/2021, 4:41 PM

## 2021-03-29 NOTE — Progress Notes (Signed)
FHT 152. Pt declined vaginal discharge and bleeding. Pt confirmed positive fetal movement.

## 2021-03-30 ENCOUNTER — Inpatient Hospital Stay: Payer: Medicaid Other

## 2021-03-30 DIAGNOSIS — J90 Pleural effusion, not elsewhere classified: Secondary | ICD-10-CM | POA: Diagnosis not present

## 2021-03-30 DIAGNOSIS — R0602 Shortness of breath: Secondary | ICD-10-CM | POA: Diagnosis not present

## 2021-03-30 DIAGNOSIS — N12 Tubulo-interstitial nephritis, not specified as acute or chronic: Secondary | ICD-10-CM | POA: Diagnosis not present

## 2021-03-30 DIAGNOSIS — O26832 Pregnancy related renal disease, second trimester: Secondary | ICD-10-CM | POA: Diagnosis not present

## 2021-03-30 DIAGNOSIS — N151 Renal and perinephric abscess: Secondary | ICD-10-CM | POA: Diagnosis not present

## 2021-03-30 DIAGNOSIS — Z3A25 25 weeks gestation of pregnancy: Secondary | ICD-10-CM | POA: Diagnosis not present

## 2021-03-30 DIAGNOSIS — O99512 Diseases of the respiratory system complicating pregnancy, second trimester: Secondary | ICD-10-CM | POA: Diagnosis not present

## 2021-03-30 LAB — BASIC METABOLIC PANEL
Anion gap: 7 (ref 5–15)
BUN: 7 mg/dL (ref 6–20)
CO2: 24 mmol/L (ref 22–32)
Calcium: 8.4 mg/dL — ABNORMAL LOW (ref 8.9–10.3)
Chloride: 105 mmol/L (ref 98–111)
Creatinine, Ser: 0.54 mg/dL (ref 0.44–1.00)
GFR, Estimated: >60 mL/min (ref >60)
Glucose, Bld: 93 mg/dL (ref 70–99)
Potassium: 3.5 mmol/L (ref 3.5–5.1)
Sodium: 136 mmol/L (ref 135–145)

## 2021-03-30 LAB — CBC
HCT: 30.1 % — ABNORMAL LOW (ref 36.0–46.0)
Hemoglobin: 9.9 g/dL — ABNORMAL LOW (ref 12.0–15.0)
MCH: 29.4 pg (ref 26.0–34.0)
MCHC: 32.9 g/dL (ref 30.0–36.0)
MCV: 89.3 fL (ref 80.0–100.0)
Platelets: 445 10*3/uL — ABNORMAL HIGH (ref 150–400)
RBC: 3.37 MIL/uL — ABNORMAL LOW (ref 3.87–5.11)
RDW: 13.3 % (ref 11.5–15.5)
WBC: 8.8 10*3/uL (ref 4.0–10.5)
nRBC: 0 % (ref 0.0–0.2)

## 2021-03-30 LAB — CULTURE, BLOOD (ROUTINE X 2)
Culture: NO GROWTH
Culture: NO GROWTH
Special Requests: ADEQUATE
Special Requests: ADEQUATE

## 2021-03-30 LAB — BODY FLUID CULTURE W GRAM STAIN: Culture: NO GROWTH

## 2021-03-30 LAB — MAGNESIUM: Magnesium: 2 mg/dL (ref 1.7–2.4)

## 2021-03-30 MED ORDER — HYDROMORPHONE HCL 1 MG/ML PO LIQD
1.5000 mg | ORAL | Status: DC | PRN
Start: 2021-03-30 — End: 2021-04-02
  Administered 2021-03-30 – 2021-04-02 (×15): 1.5 mg via ORAL
  Filled 2021-03-30 (×15): qty 2

## 2021-03-30 MED ORDER — DIAZEPAM 5 MG PO TABS
5.0000 mg | ORAL_TABLET | Freq: Once | ORAL | Status: AC
  Administered 2021-03-30: 5 mg via ORAL
  Filled 2021-03-30: qty 1

## 2021-03-30 MED ORDER — HYDROMORPHONE HCL 1 MG/ML PO LIQD
2.0000 mg | Freq: Four times a day (QID) | ORAL | Status: DC | PRN
Start: 2021-03-30 — End: 2021-03-30

## 2021-03-30 NOTE — Progress Notes (Signed)
Subjective:    Transferred from ICU back to mother-baby.  Patient appears much more comfortable.  States her pain is now controlled.  She has decreased anxiety.  No longer significantly short of breath.  Objective:    Patient Vitals for the past 2 hrs:  BP Temp Temp src Pulse Resp  03/30/21 1200 (!) 100/59 98.3 F (36.8 C) Oral 79 20   Total I/O In: 220 [P.O.:120; IV Piggyback:100] Out: -   Labs: Results for orders placed or performed during the hospital encounter of 03/24/21 (from the past 24 hour(s))  Basic metabolic panel     Status: Abnormal   Collection Time: 03/30/21  5:04 AM  Result Value Ref Range   Sodium 136 135 - 145 mmol/L   Potassium 3.5 3.5 - 5.1 mmol/L   Chloride 105 98 - 111 mmol/L   CO2 24 22 - 32 mmol/L   Glucose, Bld 93 70 - 99 mg/dL   BUN 7 6 - 20 mg/dL   Creatinine, Ser 6.76 0.44 - 1.00 mg/dL   Calcium 8.4 (L) 8.9 - 10.3 mg/dL   GFR, Estimated >72 >09 mL/min   Anion gap 7 5 - 15  CBC     Status: Abnormal   Collection Time: 03/30/21  5:04 AM  Result Value Ref Range   WBC 8.8 4.0 - 10.5 K/uL   RBC 3.37 (L) 3.87 - 5.11 MIL/uL   Hemoglobin 9.9 (L) 12.0 - 15.0 g/dL   HCT 47.0 (L) 96.2 - 83.6 %   MCV 89.3 80.0 - 100.0 fL   MCH 29.4 26.0 - 34.0 pg   MCHC 32.9 30.0 - 36.0 g/dL   RDW 62.9 47.6 - 54.6 %   Platelets 445 (H) 150 - 400 K/uL   nRBC 0.0 0.0 - 0.2 %  Magnesium     Status: None   Collection Time: 03/30/21  5:04 AM  Result Value Ref Range   Magnesium 2.0 1.7 - 2.4 mg/dL    Medications    Current Discharge Medication List     CONTINUE these medications which have NOT CHANGED   Details  acetaminophen (TYLENOL) 325 MG tablet Take 2 tablets (650 mg total) by mouth every 4 (four) hours as needed (for pain scale < 4  OR  temperature  >/=  100.5 F). Qty: 60 tablet, Refills: 0    cyclobenzaprine (FLEXERIL) 10 MG tablet Take 1 tablet (10 mg total) by mouth 3 (three) times daily as needed for muscle spasms. Qty: 30 tablet, Refills: 2     docusate sodium (COLACE) 100 MG capsule Take 1 capsule (100 mg total) by mouth 2 (two) times daily as needed for mild constipation. Qty: 60 capsule, Refills: 1    ferrous sulfate 325 (65 FE) MG tablet Take 1 tablet (325 mg total) by mouth 2 (two) times daily with a meal. Qty: 60 tablet, Refills: 3    ondansetron (ZOFRAN-ODT) 4 MG disintegrating tablet Take 1 tablet (4 mg total) by mouth every 8 (eight) hours as needed for nausea or vomiting. Qty: 60 tablet, Refills: 0    Prenatal Vit-Fe Fumarate-FA (MULTIVITAMIN-PRENATAL) 27-0.8 MG TABS tablet Take 1 tablet by mouth daily at 12 noon.       STOP taking these medications     HYDROmorphone (DILAUDID) 4 MG tablet Comments:  Reason for Stopping:       promethazine (PHENERGAN) 12.5 MG suppository Comments:  Reason for Stopping:           Decreased breath sounds on  the left three quarters of the way up     Assessment/Plan:               1.  26-week intrauterine pregnancy (fetal anatomy ultrasound now complete)             2.  Renal abscess status post lithotripsy          Patient receiving intravenous antibiotics.  Abscess has decreased in size and patient symptomatically improved.  Plan PICC line followed by outpatient Rocephin per ID and hospitalists. 3.  Pleural effusion -status post thoracentesis -pleural effusion certainly remains.  Significantly decreased breath sounds on the left.  Expectation is that as renal abscess resolves pleural effusion will also resolve. 4.  Pain control much improved.  A significant help has been antianxiety type medication.   5.  Urology should continue to follow for renal abscess/left side kidney function.  I spent 35 minutes involved in the care of this patient preparing to see the patient by obtaining and reviewing her medical history (including labs, imaging tests and prior procedures), documenting clinical information in the electronic health record (EHR), counseling and coordinating care plans,  writing and sending prescriptions, ordering tests or procedures and in direct communicating with the patient and medical staff discussing pertinent items from her history and physical exam.  Elonda Husky, M.D. 03/30/2021 1:43 PM

## 2021-03-30 NOTE — Progress Notes (Signed)
Date of Admission:  03/24/2021     ID: Laura Sosa is a 35 y.o. female  Principal Problem:   Shortness of breath Active Problems:   History of pyelonephritis   Hyperemesis gravidarum   Hyperemesis affecting pregnancy, antepartum   Pregnancy   Anemia of pregnancy in second trimester   Renal abscess   Pleural effusion   Status post thoracentesis    Subjective: Still has pain on the left shoulder and left side of the chest. Has some shortness of breath.  Out of ICU.  Plan for pleurocentesis tomorrow.  Patient does not want initial provider who did the first thoracentesis to do it again tomorrow.  Medications:   Chlorhexidine Gluconate Cloth  6 each Topical Daily   diazepam  5 mg Oral Once   enoxaparin (LOVENOX) injection  40 mg Subcutaneous Q24H   feeding supplement  1 Container Oral TID BM   gabapentin  300 mg Oral TID   potassium chloride  40 mEq Oral Once   prenatal multivitamin  1 tablet Oral Q1200    Objective: Vital signs in last 24 hours: Temp:  [97.2 F (36.2 C)-98.4 F (36.9 C)] 98.4 F (36.9 C) (08/04 1934) Pulse Rate:  [72-104] 104 (08/04 1934) Resp:  [16-27] 18 (08/04 1934) BP: (89-112)/(55-75) 112/75 (08/04 1934) SpO2:  [91 %-98 %] 96 % (08/04 1934)  PHYSICAL EXAM:  General: Alert, cooperative, no distress, at rest.   Head: Normocephalic, without obvious abnormality, atraumatic. Eyes: Conjunctivae clear, anicteric sclerae. Pupils are equal ENT Nares normal. No drainage or sinus tenderness. Lips, mucosa, and tongue normal. No Thrush Neck: Supple, symmetrical, no adenopathy, thyroid: non tender no carotid bruit and no JVD. Back: No CVA tenderness. Lungs: Bilateral air entry decreased left side Heart: Regular rate and rhythm, no murmur, rub or gallop. Abdomen: Soft, non-tender,not distended. Bowel sounds normal. No masses.  Uterus palpable below umbilicus Extremities: atraumatic, no cyanosis. No edema. No clubbing Skin: No rashes or lesions. Or  bruising.  Multiple tattoos Lymph: Cervical, supraclavicular normal. Neurologic: Grossly non-focal  Lab Results Recent Labs    03/28/21 0415 03/30/21 0504  WBC 9.2 8.8  HGB 9.3* 9.9*  HCT 28.8* 30.1*  NA 132* 136  K 3.3* 3.5  CL 101 105  CO2 23 24  BUN 7 7  CREATININE 0.76 0.54   Liver Panel No results for input(s): PROT, ALBUMIN, AST, ALT, ALKPHOS, BILITOT, BILIDIR, IBILI in the last 72 hours. Sedimentation Rate No results for input(s): ESRSEDRATE in the last 72 hours. C-Reactive Protein No results for input(s): CRP in the last 72 hours.  Microbiology:  Studies/Results: DG Chest Port 1 View  Result Date: 03/30/2021 CLINICAL DATA:  Left pleural effusion EXAM: PORTABLE CHEST 1 VIEW COMPARISON:  03/26/2021 FINDINGS: Single frontal view of the chest demonstrates enlarging left pleural effusion, with progressive left basilar consolidation. No pneumothorax. Right chest is clear. IMPRESSION: 1. Progressive left basilar consolidation and effusion. Electronically Signed   By: Sharlet Salina M.D.   On: 03/30/2021 16:14     Assessment/Plan: Left renal pyelonephritis with multiple abscesses secondary to renal stone which has been lithotripsied.  She had a stent before. E. coli in the last urine culture was pansensitive. Patient is on IV ceftriaxone.  She will need a total of 4 weeks.  We will repeat another ultrasound to look for improvement in the abscess.  Discussed with interventional radiologist who said that it was phlegmon and cannot be aspirated.  Left pleural effusion with hypoxia.  Underwent  pleurocentesis.  It was an exudate. Culture negative so far  The fluid has recollected.  So will have to be drained again.  Anemia Abnormal LFTs Exam 26-week pregnant Skin discussed the management the patient and the hospitalist on her nurses.

## 2021-03-30 NOTE — Progress Notes (Signed)
PROGRESS NOTE  Consult progress note.  Laura Sosa  YHC:[id number] DOB: February 07, 1986 DOA: 03/24/2021 PCP: [place] [place]   Brief Narrative: Taken from consult note. Laura R Miu is a 35 y.o. female with medical history significant for history of bilateral kidney stones, migraine headaches, pyelonephritis, G3P2002, [redacted] weeks pregnant, who was admitted to the hospital under obstetrics and gynecologic service for left flank pain, pyuria, hematuria, pain management and IV antibiotics.   She was recently treated for urinary tract infection and left renal abscess failing outpatient therapy with 9 doses of Rocephin outpatient.  She is status post left ureteral stent exchange on 03/06/2021 and subsequent stent removal on 03/08/2021.  Per ID she will need 3 to 4 weeks of antibiotics for this persistent infection.  Imaging without any new abscess.   Hospitalist service was consulted for shortness of breath, worse with exertion.  Found to have large left pleural effusion-thoracentesis with removal of 500 cc of exudative fluid, cultures pending.  Developed postthoracentesis left-sided pain and spasms along with some shortness of breath-transfer to stepdown overnight due to worsening pain and dyspnea.  Repeat chest x-ray with moderate left-sided pleural effusion, looks pretty much the same as before thoracentesis.  No pneumothorax.  Subjective: Pt reported feeling much better today.  Pain controlled.  CXR today showed worsening left pleural effusion and consolidation.   Assessment & Plan:   Principal Problem:   Shortness of breath Active Problems:   History of pyelonephritis   Hyperemesis gravidarum   Hyperemesis affecting pregnancy, antepartum   Pregnancy   Anemia of pregnancy in second trimester   Renal abscess   Pleural effusion   Status post thoracentesis  Exertional dyspnea.   Multifactorial with current pregnancy, dilutional anemia and left pleural effusion.  Troponin, BNP  within normal limit.  Procalcitonin at 0.16, CRP of 5.7 and ESR of 85 with history of recent left renal abscess and urologic procedures.  D-dimer was mildly elevated but CTA was negative for PE. --transferred to stepdown for worsening dyspnea.  Currently seems improving, weaned off from oxygen.    Exudative pleural effusion on the left Thoracentesis was done, initial labs with exudative fluid.  Cultures negative so far. --likely reactive due to pyelo --CXR today showed worsening left pleural effusion and consolidation. --pulm recommended repeat thoracentesis by IR  severe left-sided chest pain/spasms  Left shoulder pain --developed after thoracentesis, maybe hit a nerve. Plan: --avoid IV pain meds --oral liquid dilaudid, taper down to 1.5 mg q4h PRN --cont liquid gabapentin 300 mg TID  Hypomagnesemia/hypokalemia.   --monitor and replete PRN  History of recent left renal abscess/pyelonephritis/nephrolithiasis.  Patient had recent lithotripsy done, also has a stent placement followed by removal due to worsening pain.  ID and urology is also following. --no drainable collection, per IR Plan: --ceftriaxone 2g for 4 weeks, per ID, till resolution of the renal abscess. --will discharge home with PICC line to continue abx  Fetal monitoring. -Per OB/GYN.   DVT prophylaxis: Lovenox SQ Code Status: Full code  Family Communication:  Status is: inpatient Dispo:   The patient is from: home Anticipated d/c is to: home Anticipated d/c date is: 2-3 days Patient currently is not medically stable to d/c due to: need repeat thoracentesis, pt hesitant    Objective: Vitals:   03/30/21 0800 03/30/21 0900 03/30/21 1200 03/30/21 1431  BP: (!) 98/59 102/67 (!) 100/59 102/71  Pulse: 72 73 79 100  Resp: 17 (!) _0 Temp:  (!) 97.2 F (36.2  C) 98.3 F (36.8 C) 98.3 F (36.8 C)  TempSrc:  Oral Oral Oral  SpO2: 95% 96%  98%  Weight:      Height:        Intake/Output Summary (Last 24  hours) at 03/30/2021 1753 Last data filed at 03/30/2021 1000 Gross per 24 hour  Intake 220 ml  Output --  Net 220 ml   Filed Weights   03/24/21 2100  Weight: 69.4 kg    Examination:  Constitutional: NAD, AAOx3 HEENT: conjunctivae and lids normal, EOMI CV: No cyanosis.   RESP: normal respiratory effort, on RA Extremities: No effusions, edema in BLE SKIN: warm, dry Neuro: II - XII grossly intact.   Psych: Normal mood and affect.  Appropriate judgement and reason    All the records are reviewed and case discussed with Care Management/Social Worker. Management plans discussed with the patient, nursing and they are in agreement.   Procedures:  Thoracentesis  Antimicrobials:  Ceftriaxone  Data Reviewed: I have personally reviewed following labs and imaging studies  CBC: Recent Labs  Lab 03/25/21 0906 03/28/21 0415 03/30/21 0504  WBC 9.4 9.2 8.8  NEUTROABS  --  7.1  --   HGB 10.2* 9.3* 9.9*  HCT 31.2* 28.8* 30.1*  MCV 89.1 91.4 89.3  PLT 408* 419* 166*   Basic Metabolic Panel: Recent Labs  Lab 03/25/21 0906 03/26/21 0651 03/27/21 0953 03/28/21 0415 03/30/21 0504  NA 134*  --   --  132* 136  K 3.2*  --   --  3.3* 3.5  CL 100  --   --  101 105  CO2 24  --   --  23 24  GLUCOSE 77  --   --  76 93  BUN 7  --   --  7 7  CREATININE 0.57 0.60  --  0.76 0.54  CALCIUM 8.6*  --   --  8.0* 8.4*  MG  --   --  1.7 1.6* 2.0  PHOS  --   --  3.3 2.9  --    GFR: Estimated Creatinine Clearance: 94.4 mL/min (by C-G formula based on SCr of 0.54 mg/dL). Liver Function Tests: Recent Labs  Lab 03/27/21 0953  AST 92*  ALT 147*  ALKPHOS 106  BILITOT 1.2  PROT 6.5  ALBUMIN 2.1*   No results for input(s): LIPASE, AMYLASE in the last 168 hours. No results for input(s): AMMONIA in the last 168 hours. Coagulation Profile: Recent Labs  Lab 03/25/21 0906  INR 1.0   Cardiac Enzymes: No results for input(s): CKTOTAL, CKMB, CKMBINDEX, TROPONINI in the last 168 hours. BNP  (last 3 results) No results for input(s): PROBNP in the last 8760 hours. HbA1C: No results for input(s): HGBA1C in the last 72 hours. CBG: Recent Labs  Lab 03/26/21 2117  GLUCAP 94   Lipid Profile: No results for input(s): CHOL, HDL, LDLCALC, TRIG, CHOLHDL, LDLDIRECT in the last 72 hours. Thyroid Function Tests: No results for input(s): TSH, T4TOTAL, FREET4, T3FREE, THYROIDAB in the last 72 hours.  Anemia Panel: No results for input(s): VITAMINB12, FOLATE, FERRITIN, TIBC, IRON, RETICCTPCT in the last 72 hours.  Sepsis Labs: Recent Labs  Lab 03/25/21 1332 03/27/21 0953 03/28/21 0415  PROCALCITON 0.16 0.29  0.30 0.50    Recent Results (from the past 240 hour(s))  Microscopic Examination     Status: Abnormal   Collection Time: 03/24/21 12:59 PM   Urine  Result Value Ref Range Status   WBC, UA >30 (A) 0 -  5 /hpf Final   RBC 11-30 (A) 0 - 2 /hpf Final   Epithelial Cells (non renal) 0-10 0 - 10 /hpf Final   Casts Present (A) None seen /lpf Final   Cast Type Hyaline casts N/A Final   Mucus, UA Present (A) Not Estab. Final   Bacteria, UA Moderate (A) None seen/Few Final  CULTURE, URINE COMPREHENSIVE     Status: None   Collection Time: 03/24/21  1:50 PM   Specimen: Urine   UR  Result Value Ref Range Status   Urine Culture, Comprehensive Final report  Final   Organism ID, Bacteria Comment  Final    Comment: No growth in 36 - 48 hours.  SARS CORONAVIRUS 2 (TAT 6-24 HRS) Nasopharyngeal Nasopharyngeal Swab     Status: None   Collection Time: 03/24/21  6:15 PM   Specimen: Nasopharyngeal Swab  Result Value Ref Range Status   SARS Coronavirus 2 NEGATIVE NEGATIVE Final    Comment: (NOTE) SARS-CoV-2 target nucleic acids are NOT DETECTED.  The SARS-CoV-2 RNA is generally detectable in upper and lower respiratory specimens during the acute phase of infection. Negative results do not preclude SARS-CoV-2 infection, do not rule out co-infections with other pathogens, and should  not be used as the sole basis for treatment or other patient management decisions. Negative results must be combined with clinical observations, patient history, and epidemiological information. The expected result is Negative.  Fact Sheet for Patients: SugarRoll.be  Fact Sheet for Healthcare Providers: https://www.woods-mathews.com/  This test is not yet approved or cleared by the [place] FDA and  has been authorized for detection and/or diagnosis of SARS-CoV-2 by FDA under an Emergency Use Authorization (EUA). This EUA will remain  in effect (meaning this test can be used) for the duration of the COVID-19 declaration under Se ction 564(b)(1) of the Act, 21 U.S.C. section 360bbb-3(b)(1), unless the authorization is terminated or revoked sooner.  Performed at [facility], [place] [address], [place], [place] [address]   Urine Culture     Status: Abnormal   Collection Time: 03/24/21 10:49 PM   Specimen: Urine, Clean Catch  Result Value Ref Range Status   Specimen Description   Final    URINE, CLEAN CATCH Performed at [facility], [address] [place], [place] [address]    Special Requests   Final    NONE Performed at [facility], [place], [place] [address]    Culture 20,000 COLONIES/mL YEAST (A)  Final   Report Status 03/26/2021 FINAL  Final  Culture, blood (routine x 2)     Status: None   Collection Time: 03/25/21  9:06 AM   Specimen: BLOOD RIGHT HAND  Result Value Ref Range Status   Specimen Description BLOOD RIGHT HAND  Final   Special Requests   Final    BOTTLES DRAWN AEROBIC AND ANAEROBIC Blood Culture adequate volume   Culture   Final    NO GROWTH 5 DAYS Performed at [facility], [address], [place], [place] [address]    Report Status 03/30/2021 FINAL  Final  Culture, blood (routine x 2)     Status: None   Collection Time: 03/25/21  9:20 AM   Specimen: BLOOD  LEFT HAND  Result Value Ref Range Status   Specimen Description BLOOD LEFT HAND  Final   Special Requests   Final    BOTTLES DRAWN AEROBIC AND ANAEROBIC Blood Culture adequate volume   Culture   Final  NO GROWTH 5 DAYS Performed at [facility], [address] [place], [place] [address]    Report Status 03/30/2021 FINAL  Final  Acid Fast Smear (AFB)     Status: None   Collection Time: 03/26/21 10:30 AM   Specimen: PATH Cytology Pleural fluid  Result Value Ref Range Status   AFB Specimen Processing Concentration  Final   Acid Fast Smear Negative  Final    Comment: (NOTE) Performed At: [facility] [place], [place] [id number] Rush Farmer MD NI:[id number]    Source (AFB) PLEURAL  Final    Comment: Performed at [facility], [address] [place], [place] [address]  Body fluid culture w Gram Stain     Status: None   Collection Time: 03/26/21 10:30 AM   Specimen: PATH Cytology Pleural fluid  Result Value Ref Range Status   Specimen Description   Final    PLEURAL Performed at [facility], [address], [place], [place] [address]    Special Requests   Final    PLEURAL Performed at [facility], [address] [place], [place] [address]    Gram Stain   Final    RARE WBC PRESENT,BOTH PMN AND MONONUCLEAR NO ORGANISMS SEEN    Culture   Final    NO GROWTH 3 DAYS Performed at [facility], [place] [address], [place], [place] [address]    Report Status 03/30/2021 FINAL  Final  MRSA Next Gen by PCR, Nasal     Status: None   Collection Time: 03/26/21 10:06 PM   Specimen: Nasal Mucosa; Nasal Swab  Result Value Ref Range Status   MRSA by PCR Next Gen NOT DETECTED NOT DETECTED Final    Comment: (NOTE) The GeneXpert MRSA Assay (FDA approved for NASAL specimens only), is one component of a comprehensive MRSA colonization surveillance program. It is not intended to diagnose MRSA infection nor to guide or  monitor treatment for MRSA infections. Test performance is not FDA approved in patients less than 53 years old. Performed at [facility], [address] [place],  [address]       Radiology Studies: DG Chest [place] 1 View  Result Date: 03/30/2021 CLINICAL DATA:  Left pleural effusion EXAM: PORTABLE CHEST 1 VIEW COMPARISON:  03/26/2021 FINDINGS: Single frontal view of the chest demonstrates enlarging left pleural effusion, with progressive left basilar consolidation. No pneumothorax. Right chest is clear. IMPRESSION: 1. Progressive left basilar consolidation and effusion. Electronically Signed   By: Randa Ngo M.D.   On: 03/30/2021 16:14    Scheduled Meds:  Chlorhexidine Gluconate Cloth  6 each Topical Daily   diazepam  5 mg Oral Once   enoxaparin (LOVENOX) injection  40 mg Subcutaneous Q24H   feeding supplement  1 Container Oral TID BM   gabapentin  300 mg Oral TID   potassium chloride  40 mEq Oral Once   prenatal multivitamin  1 tablet Oral Q1200   Continuous Infusions:  sodium chloride Stopped (03/26/21 2101)   cefTRIAXone (ROCEPHIN)  IV Stopped (03/30/21 5852)     LOS: 5 days    Enzo Bi, MD [facility]  If 7PM-7AM, please contact night-coverage [email or web address]  03/30/2021, 5:53 PM

## 2021-03-30 NOTE — Progress Notes (Signed)
Urology Inpatient Progress Note  Subjective: No acute events overnight.  She is afebrile, VSS. ID has discussed possible reconsideration of renal abscess drainage with IR, who felt it was not a drainable collection. Creatinine down today, 0.54.  WBC count down today, 8.8.  On antibiotics as below. Pleural fluid culture pending with no growth at 3 days.  Blood cultures finalized with no growth at 5 days. Patient is asleep on rounding today and unable to contribute to HPI.  Per nursing, she continues to report primarily pleuritic left-sided chest pain radiating to the shoulder.  Anti-infectives: Anti-infectives (From admission, onward)    Start     Dose/Rate Route Frequency Ordered Stop   03/29/21 0930  fluconazole (DIFLUCAN) IVPB 200 mg        200 mg 100 mL/hr over 60 Minutes Intravenous  Once 03/29/21 0834 03/29/21 1042   03/25/21 2200  Vancomycin (VANCOCIN) 1,250 mg in sodium chloride 0.9 % 250 mL IVPB  Status:  Discontinued       Note to Pharmacy: Please adjust dose accordingly. [redacted] weeks pregnant. Labs are ordered. Thank you   1,250 mg 166.7 mL/hr over 90 Minutes Intravenous Every 12 hours 03/25/21 0828 03/25/21 1139   03/25/21 1000  Vancomycin (VANCOCIN) 1,500 mg in sodium chloride 0.9 % 500 mL IVPB       Note to Pharmacy: Please adjust dose accordingly. [redacted] weeks pregnant. Labs are ordered. Thank you   1,500 mg 250 mL/hr over 120 Minutes Intravenous  Once 03/25/21 0825 03/25/21 1118   03/25/21 0900  cefTRIAXone (ROCEPHIN) 2 g in sodium chloride 0.9 % 100 mL IVPB        2 g 200 mL/hr over 30 Minutes Intravenous Every 24 hours 03/25/21 0805     03/25/21 0900  vancomycin (VANCOCIN) IVPB 1000 mg/200 mL premix  Status:  Discontinued       Note to Pharmacy: Please adjust dose accordingly. [redacted] weeks pregnant. Labs are ordered. Thank you   1,000 mg 200 mL/hr over 60 Minutes Intravenous Every 12 hours 03/25/21 0805 03/25/21 0825       Current Facility-Administered Medications  Medication  Dose Route Frequency Provider Last Rate Last Admin   0.9 %  sodium chloride infusion   Intravenous PRN Linzie Collin, MD   Stopped at 03/26/21 2101   calcium carbonate (TUMS - dosed in mg elemental calcium) chewable tablet 400 mg of elemental calcium  2 tablet Oral Q4H PRN Arnetha Courser, MD       cefTRIAXone (ROCEPHIN) 2 g in sodium chloride 0.9 % 100 mL IVPB  2 g Intravenous Q24H Arnetha Courser, MD   Stopped at 03/29/21 3419   Chlorhexidine Gluconate Cloth 2 % PADS 6 each  6 each Topical Daily Linzie Collin, MD   6 each at 03/29/21 0942   enoxaparin (LOVENOX) injection 40 mg  40 mg Subcutaneous Q24H Arnetha Courser, MD   40 mg at 03/28/21 2101   feeding supplement (BOOST / RESOURCE BREEZE) liquid 1 Container  1 Container Oral TID BM Arnetha Courser, MD   1 Container at 03/29/21 2014   gabapentin (NEURONTIN) 250 MG/5ML solution 300 mg  300 mg Oral TID Darlin Priestly, MD   300 mg at 03/29/21 2203   HYDROmorphone HCl (DILAUDID) liquid 2 mg  2 mg Oral Q4H PRN Darlin Priestly, MD   2 mg at 03/30/21 0433   hydrOXYzine (ATARAX/VISTARIL) tablet 25 mg  25 mg Oral TID PRN Arnetha Courser, MD   25 mg at 03/29/21 2351  ondansetron (ZOFRAN) injection 4 mg  4 mg Intravenous Q6H PRN Arnetha Courser, MD   4 mg at 03/29/21 1106   potassium chloride SA (KLOR-CON) CR tablet 40 mEq  40 mEq Oral Once Darlin Priestly, MD       prenatal multivitamin tablet 1 tablet  1 tablet Oral Q1200 Arnetha Courser, MD       promethazine (PHENERGAN) suppository 12.5 mg  12.5 mg Rectal Q8H PRN Arnetha Courser, MD   12.5 mg at 03/25/21 1618   zolpidem (AMBIEN) tablet 5 mg  5 mg Oral QHS PRN Arnetha Courser, MD   5 mg at 03/29/21 2253   Objective: Vital signs in last 24 hours: Temp:  [97.7 F (36.5 C)-98 F (36.7 C)] 98 F (36.7 C) (08/04 0400) Pulse Rate:  [65-92] 76 (08/04 0700) Resp:  [16-27] 17 (08/04 0700) BP: (89-113)/(55-68) 95/61 (08/04 0700) SpO2:  [91 %-96 %] 95 % (08/04 0700)  Intake/Output from previous day: 08/03 0701 - 08/04  0700 In: 639.7 [P.O.:360; IV Piggyback:279.7] Out: 500 [Urine:500] Intake/Output this shift: No intake/output data recorded.  Physical Exam Vitals and nursing note reviewed.  Constitutional:      General: She is sleeping. She is not in acute distress.    Appearance: She is not ill-appearing, toxic-appearing or diaphoretic.  HENT:     Head: Normocephalic and atraumatic.  Pulmonary:     Effort: Pulmonary effort is normal. No respiratory distress.  Skin:    General: Skin is warm and dry.   Lab Results:  Recent Labs    03/28/21 0415 03/30/21 0504  WBC 9.2 8.8  HGB 9.3* 9.9*  HCT 28.8* 30.1*  PLT 419* 445*   BMET Recent Labs    03/28/21 0415 03/30/21 0504  NA 132* 136  K 3.3* 3.5  CL 101 105  CO2 23 24  GLUCOSE 76 93  BUN 7 7  CREATININE 0.76 0.54  CALCIUM 8.0* 8.4*   Assessment & Plan: 35 year old pregnant female at [redacted]w[redacted]d admitted with multiple left renal abscesses as well as concern for possible psoas abscess who developed pleural effusion during admission now s/p thoracentesis 4 days ago.  No acute change today, agree with ID recommendations to repeat renal ultrasound on Monday.  Recommendations: -Continue IV ceftriaxone 2 g daily for total of 4 weeks of therapy per ID -Repeat renal ultrasound on Monday, 04/03/2021 -Continue pain control per OB/GYN -Continue to trend serum creatinine and CBC -Pleural effusion management per pulmonology/crit care  Carman Ching, PA-C 03/30/2021

## 2021-03-31 ENCOUNTER — Ambulatory Visit
Admission: RE | Admit: 2021-03-31 | Discharge: 2021-03-31 | Disposition: A | Discharge status: Home / Self Care | Payer: Medicaid Other | Source: Ambulatory Visit | Attending: Obstetrics and Gynecology | Admitting: Obstetrics and Gynecology

## 2021-03-31 DIAGNOSIS — J90 Pleural effusion, not elsewhere classified: Secondary | ICD-10-CM | POA: Diagnosis not present

## 2021-03-31 DIAGNOSIS — O26832 Pregnancy related renal disease, second trimester: Secondary | ICD-10-CM | POA: Diagnosis not present

## 2021-03-31 DIAGNOSIS — R0602 Shortness of breath: Secondary | ICD-10-CM | POA: Diagnosis not present

## 2021-03-31 DIAGNOSIS — N151 Renal and perinephric abscess: Secondary | ICD-10-CM | POA: Diagnosis not present

## 2021-03-31 DIAGNOSIS — O99512 Diseases of the respiratory system complicating pregnancy, second trimester: Secondary | ICD-10-CM | POA: Diagnosis not present

## 2021-03-31 DIAGNOSIS — Z3A25 25 weeks gestation of pregnancy: Secondary | ICD-10-CM | POA: Diagnosis not present

## 2021-03-31 LAB — BASIC METABOLIC PANEL
Anion gap: 7 (ref 5–15)
BUN: 9 mg/dL (ref 6–20)
CO2: 23 mmol/L (ref 22–32)
Calcium: 8.1 mg/dL — ABNORMAL LOW (ref 8.9–10.3)
Chloride: 107 mmol/L (ref 98–111)
Creatinine, Ser: 0.71 mg/dL (ref 0.44–1.00)
GFR, Estimated: >60 mL/min (ref >60)
Glucose, Bld: 116 mg/dL — ABNORMAL HIGH (ref 70–99)
Potassium: 3.2 mmol/L — ABNORMAL LOW (ref 3.5–5.1)
Sodium: 137 mmol/L (ref 135–145)

## 2021-03-31 LAB — CBC
HCT: 29.4 % — ABNORMAL LOW (ref 36.0–46.0)
Hemoglobin: 9.8 g/dL — ABNORMAL LOW (ref 12.0–15.0)
MCH: 30.2 pg (ref 26.0–34.0)
MCHC: 33.3 g/dL (ref 30.0–36.0)
MCV: 90.7 fL (ref 80.0–100.0)
Platelets: 421 10*3/uL — ABNORMAL HIGH (ref 150–400)
RBC: 3.24 MIL/uL — ABNORMAL LOW (ref 3.87–5.11)
RDW: 13.3 % (ref 11.5–15.5)
WBC: 8.9 10*3/uL (ref 4.0–10.5)
nRBC: 0 % (ref 0.0–0.2)

## 2021-03-31 LAB — HEPATIC FUNCTION PANEL
ALT: 83 U/L — ABNORMAL HIGH (ref 0–44)
AST: 52 U/L — ABNORMAL HIGH (ref 15–41)
Albumin: 2 g/dL — ABNORMAL LOW (ref 3.5–5.0)
Alkaline Phosphatase: 108 U/L (ref 38–126)
Bilirubin, Direct: 0.3 mg/dL — ABNORMAL HIGH (ref 0.0–0.2)
Indirect Bilirubin: 0.3 mg/dL (ref 0.3–0.9)
Total Bilirubin: 0.6 mg/dL (ref 0.3–1.2)
Total Protein: 5.9 g/dL — ABNORMAL LOW (ref 6.5–8.1)

## 2021-03-31 LAB — MAGNESIUM: Magnesium: 1.8 mg/dL (ref 1.7–2.4)

## 2021-03-31 MED ORDER — POTASSIUM CHLORIDE 10 MEQ/100ML IV SOLN
10.0000 meq | INTRAVENOUS | Status: AC
Start: 2021-03-31 — End: 2021-04-01
  Administered 2021-03-31 – 2021-04-01 (×3): 10 meq via INTRAVENOUS
  Filled 2021-03-31 (×3): qty 100

## 2021-03-31 MED ORDER — METHOCARBAMOL 1000 MG/10ML IJ SOLN
500.0000 mg | Freq: Three times a day (TID) | INTRAVENOUS | Status: DC
  Administered 2021-03-31 – 2021-04-03 (×8): 500 mg via INTRAVENOUS
  Filled 2021-03-31 (×8): qty 5

## 2021-03-31 MED ORDER — POTASSIUM CHLORIDE 20 MEQ PO PACK
40.0000 meq | PACK | Freq: Once | ORAL | Status: DC
  Filled 2021-03-31 (×2): qty 2

## 2021-03-31 NOTE — Progress Notes (Signed)
Subjective:    Comfortable, pain controlled.  Breathing well.  Fetal movement  Objective:    Patient Vitals for the past 2 hrs:  BP Temp Temp src Pulse Resp SpO2  03/31/21 0810 104/64 98.5 F (36.9 C) Oral 83 18 95 %   No intake/output data recorded.  Labs: Results for orders placed or performed during the hospital encounter of 03/24/21 (from the past 24 hour(s))  Basic metabolic panel     Status: Abnormal   Collection Time: 03/31/21  4:52 AM  Result Value Ref Range   Sodium 137 135 - 145 mmol/L   Potassium 3.2 (L) 3.5 - 5.1 mmol/L   Chloride 107 98 - 111 mmol/L   CO2 23 22 - 32 mmol/L   Glucose, Bld 116 (H) 70 - 99 mg/dL   BUN 9 6 - 20 mg/dL   Creatinine, Ser 0.99 0.44 - 1.00 mg/dL   Calcium 8.1 (L) 8.9 - 10.3 mg/dL   GFR, Estimated >83 >38 mL/min   Anion gap 7 5 - 15  CBC     Status: Abnormal   Collection Time: 03/31/21  4:52 AM  Result Value Ref Range   WBC 8.9 4.0 - 10.5 K/uL   RBC 3.24 (L) 3.87 - 5.11 MIL/uL   Hemoglobin 9.8 (L) 12.0 - 15.0 g/dL   HCT 25.0 (L) 53.9 - 76.7 %   MCV 90.7 80.0 - 100.0 fL   MCH 30.2 26.0 - 34.0 pg   MCHC 33.3 30.0 - 36.0 g/dL   RDW 34.1 93.7 - 90.2 %   Platelets 421 (H) 150 - 400 K/uL   nRBC 0.0 0.0 - 0.2 %  Magnesium     Status: None   Collection Time: 03/31/21  4:52 AM  Result Value Ref Range   Magnesium 1.8 1.7 - 2.4 mg/dL    Medications    Current Discharge Medication List     CONTINUE these medications which have NOT CHANGED   Details  acetaminophen (TYLENOL) 325 MG tablet Take 2 tablets (650 mg total) by mouth every 4 (four) hours as needed (for pain scale < 4  OR  temperature  >/=  100.5 F). Qty: 60 tablet, Refills: 0    cyclobenzaprine (FLEXERIL) 10 MG tablet Take 1 tablet (10 mg total) by mouth 3 (three) times daily as needed for muscle spasms. Qty: 30 tablet, Refills: 2    docusate sodium (COLACE) 100 MG capsule Take 1 capsule (100 mg total) by mouth 2 (two) times daily as needed for mild constipation. Qty:  60 capsule, Refills: 1    ferrous sulfate 325 (65 FE) MG tablet Take 1 tablet (325 mg total) by mouth 2 (two) times daily with a meal. Qty: 60 tablet, Refills: 3    ondansetron (ZOFRAN-ODT) 4 MG disintegrating tablet Take 1 tablet (4 mg total) by mouth every 8 (eight) hours as needed for nausea or vomiting. Qty: 60 tablet, Refills: 0    Prenatal Vit-Fe Fumarate-FA (MULTIVITAMIN-PRENATAL) 27-0.8 MG TABS tablet Take 1 tablet by mouth daily at 12 noon.       STOP taking these medications     HYDROmorphone (DILAUDID) 4 MG tablet Comments:  Reason for Stopping:       promethazine (PHENERGAN) 12.5 MG suppository Comments:  Reason for Stopping:         Assessment/Plan:               1.  26-week intrauterine pregnancy  2.  Renal abscess status post lithotripsy          Patient receiving intravenous antibiotics.  Abscess has decreased in size and patient symptomatically improved.  Not well organized - unable to drain. Plan PICC line followed by outpatient Rocephin per ID and hospitalists. 3.  Pleural effusion -status post thoracentesis -pleural effusion certainly remains.  Significantly decreased breath sounds on the left.  Exudative. Plan thoracentesis and expect lung re-expansion.  4.  Pain control much improved.  A significant help has been antianxiety type medication.   5.  Urology should continue to follow for renal abscess/left side kidney function.  I spent 31 minutes involved in the care of this patient preparing to see the patient by obtaining and reviewing her medical history (including labs, imaging tests and prior procedures), documenting clinical information in the electronic health record (EHR), counseling and coordinating care plans, writing and sending prescriptions, ordering tests or procedures and in direct communicating with the patient and medical staff discussing pertinent items from her history and physical exam.   Elonda Husky, M.D. 03/31/2021 9:56 AM

## 2021-03-31 NOTE — Progress Notes (Signed)
   Date of Admission:  03/24/2021    ID: Laura Sosa is a 35 y.o. female  Principal Problem:   Shortness of breath Active Problems:   History of pyelonephritis   Hyperemesis gravidarum   Hyperemesis affecting pregnancy, antepartum   Pregnancy   Anemia of pregnancy in second trimester   Renal abscess   Pleural effusion   Status post thoracentesis    Subjective: Pt doing a little better Pain less Last night had trouble breathing for a few seconds She wants oxygen at night time  Medications:   Chlorhexidine Gluconate Cloth  6 each Topical Daily   enoxaparin (LOVENOX) injection  40 mg Subcutaneous Q24H   feeding supplement  1 Container Oral TID BM   gabapentin  300 mg Oral TID   potassium chloride  40 mEq Oral Once   prenatal multivitamin  1 tablet Oral Q1200    Objective: Vital signs in last 24 hours: Temp:  [98.2 F (36.8 C)-98.5 F (36.9 C)] 98.2 F (36.8 C) (08/05 1142) Pulse Rate:  [83-104] 87 (08/05 1142) Resp:  [17-18] 18 (08/05 1142) BP: (102-112)/(59-75) 103/59 (08/05 1142) SpO2:  [95 %-98 %] 95 % (08/05 1142)  PHYSICAL EXAM:  General: Alert, cooperative, no distress, appears stated age.  Head: Normocephalic, without obvious abnormality, atraumatic. Eyes: Conjunctivae clear, anicteric sclerae. Pupils are equal ENT Nares normal. No drainage or sinus tenderness. Lips, mucosa, and tongue normal. No Thrush Neck: Supple, symmetrical, no adenopathy, thyroid: non tender no carotid bruit and no JVD. Back: No CVA tenderness. Lungs: decreased air entry left lower  Heart: Regular rate and rhythm, no murmur, rub or gallop. Abdomen: Soft, non-tender,not distended. Bowel sounds normal. No masses Uterus palpable below umbilicus Extremities: atraumatic, no cyanosis. No edema. No clubbing Skin: No rashes or lesions. Or bruising Lymph: Cervical, supraclavicular normal. Neurologic: Grossly non-focal  Lab Results Recent Labs    03/30/21 0504 03/31/21 0452  WBC  8.8 8.9  HGB 9.9* 9.8*  HCT 30.1* 29.4*  NA 136 137  K 3.5 3.2*  CL 105 107  CO2 24 23  BUN 7 9  CREATININE 0.54 0.71  Studies/Results: DG Chest Port 1 View  Result Date: 03/30/2021 CLINICAL DATA:  Left pleural effusion EXAM: PORTABLE CHEST 1 VIEW COMPARISON:  03/26/2021 FINDINGS: Single frontal view of the chest demonstrates enlarging left pleural effusion, with progressive left basilar consolidation. No pneumothorax. Right chest is clear. IMPRESSION: 1. Progressive left basilar consolidation and effusion. Electronically Signed   By: Sharlet Salina M.D.   On: 03/30/2021 16:14     Assessment/Plan: Left renal infection with multiple abscesses/phlegmon secondary to renal stone which has been lithotripsied.  She also had a stent before.  E. coli in the last urine culture which was pansensitive.  Patient is currently on IV ceftriaxone.  She will need a total of 4 weeks of treatment. -04/21/21   I discussed with interventional radiologist on 03/29/2021 and the kidney lesions were phlegmonous and could not be aspirated.  We will repeat another ultrasound Early next week to look for improvement. Exudative pleural effusion with hypoxia.  Underwent pleurocentesis on 03/26/2021 followed by severe pain in the left side of the chest.  The fluid analysis suggestive of an exudate.  Culture negative so far.  The fluid has recollected and it has to be drained again.  Anemia  Abnormal LFTs- improving   Discussed the management with patient, her partner and the care team

## 2021-03-31 NOTE — Progress Notes (Signed)
PROGRESS NOTE  Consult progress note.  Laura Sosa  KVQ:[id number] DOB: 1986-05-02 DOA: 03/24/2021 PCP: [facility]   Brief Narrative: Taken from consult note. Laura Sosa is a 35 y.o. female with medical history significant for history of bilateral kidney stones, migraine headaches, pyelonephritis, G3P2002, [redacted] weeks pregnant, who was admitted to the hospital under obstetrics and gynecologic service for left flank pain, pyuria, hematuria, pain management and IV antibiotics.   She was recently treated for urinary tract infection and left renal abscess failing outpatient therapy with 9 doses of Rocephin outpatient.  She is status post left ureteral stent exchange on 03/06/2021 and subsequent stent removal on 03/08/2021.  Per ID she will need 3 to 4 weeks of antibiotics for this persistent infection.  Imaging without any new abscess.   Hospitalist service was consulted for shortness of breath, worse with exertion.  Found to have large left pleural effusion-thoracentesis with removal of 500 cc of exudative fluid, cultures pending.  Developed postthoracentesis left-sided pain and spasms along with some shortness of breath-transfer to stepdown overnight due to worsening pain and dyspnea.  Repeat chest x-ray with moderate left-sided pleural effusion, looks pretty much the same as before thoracentesis.  No pneumothorax.  Subjective: Pt reported back spasm if she didn't take pain meds.  Reported dark urine.  Has been walking to the vending machine.     Assessment & Plan:   Principal Problem:   Shortness of breath Active Problems:   History of pyelonephritis   Hyperemesis gravidarum   Hyperemesis affecting pregnancy, antepartum   Pregnancy   Anemia of pregnancy in second trimester   Renal abscess   Pleural effusion   Status post thoracentesis  Exertional dyspnea.   Multifactorial with current pregnancy, dilutional anemia and left pleural effusion.  Troponin, BNP within  normal limit.  Procalcitonin at 0.16, CRP of 5.7 and ESR of 85 with history of recent left renal abscess and urologic procedures.  D-dimer was mildly elevated but CTA was negative for PE. --transferred to stepdown for worsening dyspnea.  Currently seems improving, weaned off from oxygen.    Exudative pleural effusion on the left Thoracentesis was done, initial labs with exudative fluid.  Cultures negative so far. --likely reactive due to pyelo --CXR on 8/4 showed worsening left pleural effusion and consolidation. --will need repeat thoracentesis  severe left-sided chest pain/spasms  Left shoulder pain --developed after thoracentesis, maybe hit a nerve. Plan: --avoid IV pain meds --oral liquid dilaudid, 1.5 mg q4h PRN --d/c gabapentin as pt doesn't think it helps --start IV robaxin 500 mg q8h (pt can't swallow pills right now)  Hypomagnesemia/hypokalemia.   --replete with IV potassium and IV mag since pt can't swallow medicine currently.  History of recent left renal abscess/pyelonephritis/nephrolithiasis.  Patient had recent lithotripsy done, also has a stent placement followed by removal due to worsening pain.  ID and urology is also following. --no drainable collection, per IR Plan: --cont ceftriaxone --abx duration 4 weeks, per ID, till resolution of the renal abscess. --will discharge home with PICC line to continue abx  Fetal monitoring. -Per OB/GYN.   DVT prophylaxis: Lovenox SQ Code Status: Full code  Family Communication:  Status is: inpatient Dispo:   The patient is from: home Anticipated d/c is to: home Anticipated d/c date is: 2-3 days Patient currently is not medically stable to d/c due to: need repeat thoracentesis, pt hesitant    Objective: Vitals:   03/31/21 0314 03/31/21 0810 03/31/21 1142 03/31/21 1629  BP: 104/68  104/64 (!) 103/59 (!) 113/56  Pulse: 83 83 87 (!) 109  Resp: _0 Temp: 98.4 F (36.9 C) 98.5 F (36.9 C) 98.2 F (36.8 C) 97.9  F (36.6 C)  TempSrc: Oral Oral Oral Oral  SpO2: 98% 95% 95% 96%  Weight:      Height:        Intake/Output Summary (Last 24 hours) at 03/31/2021 1648 Last data filed at 03/31/2021 1056 Gross per 24 hour  Intake 101.46 ml  Output 400 ml  Net -298.54 ml   Filed Weights   03/24/21 2100  Weight: 69.4 kg    Examination:  Constitutional: NAD, AAOx3 HEENT: conjunctivae and lids normal, EOMI CV: No cyanosis.   RESP: normal respiratory effort, on RA Extremities: No effusions, edema in BLE SKIN: warm, dry Neuro: II - XII grossly intact.   Psych: Normal mood and affect.  Appropriate judgement and reason   Procedures:  Thoracentesis  Antimicrobials:  Ceftriaxone  Data Reviewed: I have personally reviewed following labs and imaging studies  CBC: Recent Labs  Lab 03/25/21 0906 03/28/21 0415 03/30/21 0504 03/31/21 0452  WBC 9.4 9.2 8.8 8.9  NEUTROABS  --  7.1  --   --   HGB 10.2* 9.3* 9.9* 9.8*  HCT 31.2* 28.8* 30.1* 29.4*  MCV 89.1 91.4 89.3 90.7  PLT 408* 419* 445* 354*   Basic Metabolic Panel: Recent Labs  Lab 03/25/21 0906 03/26/21 0651 03/27/21 0953 03/28/21 0415 03/30/21 0504 03/31/21 0452  NA 134*  --   --  132* 136 137  K 3.2*  --   --  3.3* 3.5 3.2*  CL 100  --   --  101 105 107  CO2 24  --   --  _1 GLUCOSE 77  --   --  76 93 116*  BUN 7  --   --  _2 CREATININE 0.57 0.60  --  0.76 0.54 0.71  CALCIUM 8.6*  --   --  8.0* 8.4* 8.1*  MG  --   --  1.7 1.6* 2.0 1.8  PHOS  --   --  3.3 2.9  --   --    GFR: Estimated Creatinine Clearance: 94.4 mL/min (by C-G formula based on SCr of 0.71 mg/dL). Liver Function Tests: Recent Labs  Lab 03/27/21 0953 03/31/21 0452  AST 92* 52*  ALT 147* 83*  ALKPHOS 106 108  BILITOT 1.2 0.6  PROT 6.5 5.9*  ALBUMIN 2.1* 2.0*   No results for input(s): LIPASE, AMYLASE in the last 168 hours. No results for input(s): AMMONIA in the last 168 hours. Coagulation Profile: Recent Labs  Lab 03/25/21 0906  INR  1.0   Cardiac Enzymes: No results for input(s): CKTOTAL, CKMB, CKMBINDEX, TROPONINI in the last 168 hours. BNP (last 3 results) No results for input(s): PROBNP in the last 8760 hours. HbA1C: No results for input(s): HGBA1C in the last 72 hours. CBG: Recent Labs  Lab 03/26/21 2117  GLUCAP 94   Lipid Profile: No results for input(s): CHOL, HDL, LDLCALC, TRIG, CHOLHDL, LDLDIRECT in the last 72 hours. Thyroid Function Tests: No results for input(s): TSH, T4TOTAL, FREET4, T3FREE, THYROIDAB in the last 72 hours.  Anemia Panel: No results for input(s): VITAMINB12, FOLATE, FERRITIN, TIBC, IRON, RETICCTPCT in the last 72 hours.  Sepsis Labs: Recent Labs  Lab 03/25/21 1332 03/27/21 0953 03/28/21 0415  PROCALCITON 0.16 0.29  0.30 0.50    Recent Results (from the past 240  hour(s))  Microscopic Examination     Status: Abnormal   Collection Time: 03/24/21 12:59 PM   Urine  Result Value Ref Range Status   WBC, UA >30 (A) 0 - 5 /hpf Final   RBC 11-30 (A) 0 - 2 /hpf Final   Epithelial Cells (non renal) 0-10 0 - 10 /hpf Final   Casts Present (A) None seen /lpf Final   Cast Type Hyaline casts N/A Final   Mucus, UA Present (A) Not Estab. Final   Bacteria, UA Moderate (A) None seen/Few Final  CULTURE, URINE COMPREHENSIVE     Status: None   Collection Time: 03/24/21  1:50 PM   Specimen: Urine   UR  Result Value Ref Range Status   Urine Culture, Comprehensive Final report  Final   Organism ID, Bacteria Comment  Final    Comment: No growth in 36 - 48 hours.  SARS CORONAVIRUS 2 (TAT 6-24 HRS) Nasopharyngeal Nasopharyngeal Swab     Status: None   Collection Time: 03/24/21  6:15 PM   Specimen: Nasopharyngeal Swab  Result Value Ref Range Status   SARS Coronavirus 2 NEGATIVE NEGATIVE Final    Comment: (NOTE) SARS-CoV-2 target nucleic acids are NOT DETECTED.  The SARS-CoV-2 RNA is generally detectable in upper and lower respiratory specimens during the acute phase of infection.  Negative results do not preclude SARS-CoV-2 infection, do not rule out co-infections with other pathogens, and should not be used as the sole basis for treatment or other patient management decisions. Negative results must be combined with clinical observations, patient history, and epidemiological information. The expected result is Negative.  Fact Sheet for Patients: SugarRoll.be  Fact Sheet for Healthcare Providers: https://www.woods-mathews.com/  This test is not yet approved or cleared by the [place] FDA and  has been authorized for detection and/or diagnosis of SARS-CoV-2 by FDA under an Emergency Use Authorization (EUA). This EUA will remain  in effect (meaning this test can be used) for the duration of the COVID-19 declaration under Se ction 564(b)(1) of the Act, 21 U.S.C. section 360bbb-3(b)(1), unless the authorization is terminated or revoked sooner.  Performed at [facility], [place] [address], [place], [place] [address]   Urine Culture     Status: Abnormal   Collection Time: 03/24/21 10:49 PM   Specimen: Urine, Clean Catch  Result Value Ref Range Status   Specimen Description   Final    URINE, CLEAN CATCH Performed at [facility], [address] [place], [place] [address]    Special Requests   Final    NONE Performed at [facility], [place], [place] [address]    Culture 20,000 COLONIES/mL YEAST (A)  Final   Report Status 03/26/2021 FINAL  Final  Culture, blood (routine x 2)     Status: None   Collection Time: 03/25/21  9:06 AM   Specimen: BLOOD RIGHT HAND  Result Value Ref Range Status   Specimen Description BLOOD RIGHT HAND  Final   Special Requests   Final    BOTTLES DRAWN AEROBIC AND ANAEROBIC Blood Culture adequate volume   Culture   Final    NO GROWTH 5 DAYS Performed at [facility], [address], [place], [place] [address]    Report Status  03/30/2021 FINAL  Final  Culture, blood (routine x 2)     Status: None   Collection Time: 03/25/21  9:20 AM   Specimen: BLOOD LEFT HAND  Result Value Ref Range Status  Specimen Description BLOOD LEFT HAND  Final   Special Requests   Final    BOTTLES DRAWN AEROBIC AND ANAEROBIC Blood Culture adequate volume   Culture   Final    NO GROWTH 5 DAYS Performed at [facility], [address] [place], [place] [address]    Report Status 03/30/2021 FINAL  Final  Acid Fast Smear (AFB)     Status: None   Collection Time: 03/26/21 10:30 AM   Specimen: PATH Cytology Pleural fluid  Result Value Ref Range Status   AFB Specimen Processing Concentration  Final   Acid Fast Smear Negative  Final    Comment: (NOTE) Performed At: [facility] [address] [place], [place] [id number] Rush Farmer MD IO:[id number]    Source (AFB) PLEURAL  Final    Comment: Performed at [facility], [address] [place], [place] [address]  Body fluid culture w Gram Stain     Status: None   Collection Time: 03/26/21 10:30 AM   Specimen: PATH Cytology Pleural fluid  Result Value Ref Range Status   Specimen Description   Final    PLEURAL Performed at [facility], [address] [place], [place] [address]    Special Requests   Final    PLEURAL Performed at [facility], [address] [place], [place] [address]    Gram Stain   Final    RARE WBC PRESENT,BOTH PMN AND MONONUCLEAR NO ORGANISMS SEEN    Culture   Final    NO GROWTH 3 DAYS Performed at [facility], [place] [address] [place], [place] [address]    Report Status 03/30/2021 FINAL  Final  MRSA Next Gen by PCR, Nasal     Status: None   Collection Time: 03/26/21 10:06 PM   Specimen: Nasal Mucosa; Nasal Swab  Result Value Ref Range Status   MRSA by PCR Next Gen NOT DETECTED NOT DETECTED Final    Comment: (NOTE) The GeneXpert MRSA Assay (FDA approved for NASAL specimens only), is one  component of a comprehensive MRSA colonization surveillance program. It is not intended to diagnose MRSA infection nor to guide or monitor treatment for MRSA infections. Test performance is not FDA approved in patients less than 32 years old. Performed at [facility], [address], [place], [place] [address]       Radiology Studies: DG Chest [place] 1 View  Result Date: 03/30/2021 CLINICAL DATA:  Left pleural effusion EXAM: PORTABLE CHEST 1 VIEW COMPARISON:  03/26/2021 FINDINGS: Single frontal view of the chest demonstrates enlarging left pleural effusion, with progressive left basilar consolidation. No pneumothorax. Right chest is clear. IMPRESSION: 1. Progressive left basilar consolidation and effusion. Electronically Signed   By: Randa Ngo M.D.   On: 03/30/2021 16:14    Scheduled Meds:  Chlorhexidine Gluconate Cloth  6 each Topical Daily   enoxaparin (LOVENOX) injection  40 mg Subcutaneous Q24H   feeding supplement  1 Container Oral TID BM   prenatal multivitamin  1 tablet Oral Q1200   Continuous Infusions:  sodium chloride Stopped (03/26/21 2101)   cefTRIAXone (ROCEPHIN)  IV 2 g (03/31/21 0953)   methocarbamol (ROBAXIN) IV     potassium chloride       LOS: 6 days    Enzo Bi, MD [facility]  If 7PM-7AM, please contact night-coverage [email or web address]  03/31/2021, 4:48 PM

## 2021-04-01 ENCOUNTER — Inpatient Hospital Stay: Payer: Self-pay

## 2021-04-01 ENCOUNTER — Inpatient Hospital Stay: Payer: Medicaid Other

## 2021-04-01 DIAGNOSIS — J90 Pleural effusion, not elsewhere classified: Secondary | ICD-10-CM | POA: Diagnosis not present

## 2021-04-01 DIAGNOSIS — Z3A25 25 weeks gestation of pregnancy: Secondary | ICD-10-CM | POA: Diagnosis not present

## 2021-04-01 DIAGNOSIS — R0602 Shortness of breath: Secondary | ICD-10-CM | POA: Diagnosis not present

## 2021-04-01 DIAGNOSIS — E878 Other disorders of electrolyte and fluid balance, not elsewhere classified: Secondary | ICD-10-CM | POA: Diagnosis not present

## 2021-04-01 DIAGNOSIS — N151 Renal and perinephric abscess: Secondary | ICD-10-CM | POA: Diagnosis not present

## 2021-04-01 DIAGNOSIS — O26833 Pregnancy related renal disease, third trimester: Secondary | ICD-10-CM | POA: Diagnosis not present

## 2021-04-01 DIAGNOSIS — O99512 Diseases of the respiratory system complicating pregnancy, second trimester: Secondary | ICD-10-CM | POA: Diagnosis not present

## 2021-04-01 DIAGNOSIS — O99012 Anemia complicating pregnancy, second trimester: Secondary | ICD-10-CM | POA: Diagnosis not present

## 2021-04-01 LAB — BASIC METABOLIC PANEL
Anion gap: 6 (ref 5–15)
BUN: 9 mg/dL (ref 6–20)
CO2: 24 mmol/L (ref 22–32)
Calcium: 8.3 mg/dL — ABNORMAL LOW (ref 8.9–10.3)
Chloride: 105 mmol/L (ref 98–111)
Creatinine, Ser: 0.66 mg/dL (ref 0.44–1.00)
GFR, Estimated: >60 mL/min (ref >60)
Glucose, Bld: 103 mg/dL — ABNORMAL HIGH (ref 70–99)
Potassium: 4 mmol/L (ref 3.5–5.1)
Sodium: 135 mmol/L (ref 135–145)

## 2021-04-01 LAB — BODY FLUID CELL COUNT WITH DIFFERENTIAL
Eos, Fluid: 2 %
Lymphs, Fluid: 42 %
Monocyte-Macrophage-Serous Fluid: 12 %
Neutrophil Count, Fluid: 44 %
Other Cells, Fluid: 0 %
Total Nucleated Cell Count, Fluid: 3321 cu mm

## 2021-04-01 LAB — CBC
HCT: 28.4 % — ABNORMAL LOW (ref 36.0–46.0)
Hemoglobin: 9.4 g/dL — ABNORMAL LOW (ref 12.0–15.0)
MCH: 29.5 pg (ref 26.0–34.0)
MCHC: 33.1 g/dL (ref 30.0–36.0)
MCV: 89 fL (ref 80.0–100.0)
Platelets: 413 10*3/uL — ABNORMAL HIGH (ref 150–400)
RBC: 3.19 MIL/uL — ABNORMAL LOW (ref 3.87–5.11)
RDW: 13.4 % (ref 11.5–15.5)
WBC: 10.1 10*3/uL (ref 4.0–10.5)
nRBC: 0 % (ref 0.0–0.2)

## 2021-04-01 LAB — LACTATE DEHYDROGENASE, PLEURAL OR PERITONEAL FLUID: LD, Fluid: 81 U/L — ABNORMAL HIGH (ref 3–23)

## 2021-04-01 LAB — PROTEIN, PLEURAL OR PERITONEAL FLUID: Total protein, fluid: 4.1 g/dL

## 2021-04-01 LAB — MAGNESIUM: Magnesium: 1.6 mg/dL — ABNORMAL LOW (ref 1.7–2.4)

## 2021-04-01 MED ORDER — LORAZEPAM 2 MG/ML IJ SOLN
1.0000 mg | Freq: Once | INTRAMUSCULAR | Status: AC
  Administered 2021-04-01: 1 mg via INTRAVENOUS
  Filled 2021-04-01: qty 1

## 2021-04-01 MED ORDER — HYDROMORPHONE HCL 1 MG/ML PO LIQD
2.0000 mg | Freq: Once | ORAL | Status: AC | PRN
  Administered 2021-04-01: 2 mg via ORAL
  Filled 2021-04-01: qty 2

## 2021-04-01 MED ORDER — FERROUS SULFATE 75 (15 FE) MG/ML PO SOLN
300.0000 mg | Freq: Two times a day (BID) | ORAL | Status: DC
  Administered 2021-04-03: 300 mg via ORAL
  Filled 2021-04-01 (×5): qty 4

## 2021-04-01 NOTE — Progress Notes (Signed)
Spoke with Thurston Hole RN re PICC order.  States pt has 2 PIV's working well at this time.  Plan on PICC placement 04/02/21 am.

## 2021-04-01 NOTE — Procedures (Signed)
Interventional Radiology Procedure Note  Procedure: Ultrasound guided left thoracentesis  Findings: Please refer to procedural dictation for full description. 450 mL translucent, green-tinged fluid removed.  Complications: None immediate  Estimated Blood Loss: < 5 mL  Recommendations: Follow up post thoracentesis CXR.   Marliss Coots, MD Pager: [phone]

## 2021-04-01 NOTE — Progress Notes (Addendum)
PROGRESS NOTE  Consult progress note.  Laura R Stolz  JJH:[id number] DOB: September 30, 1985 DOA: 03/24/2021 PCP: [place]   Brief Narrative: Taken from consult note. Laura Sosa is a 35 y.o. female with medical history significant for history of bilateral kidney stones, migraine headaches, pyelonephritis, G3P2002, [redacted] weeks pregnant, who was admitted to the hospital under obstetrics and gynecologic service for left flank pain, pyuria, hematuria, pain management and IV antibiotics.   She was recently treated for urinary tract infection and left renal abscess failing outpatient therapy with 9 doses of Rocephin outpatient.  She is status post left ureteral stent exchange on 03/06/2021 and subsequent stent removal on 03/08/2021.  Per ID she will need 3 to 4 weeks of antibiotics for this persistent infection.  Imaging without any new abscess.   Hospitalist service was consulted for shortness of breath, worse with exertion.  Found to have large left pleural effusion-thoracentesis with removal of 500 cc of exudative fluid, cultures pending.  Developed postthoracentesis left-sided pain and spasms along with some shortness of breath-transfer to stepdown overnight due to worsening pain and dyspnea.  Repeat chest x-ray with moderate left-sided pleural effusion, looks pretty much the same as before thoracentesis.  No pneumothorax.  Subjective: Oncall IR came from [facility] to perform thoracentesis for this pt to help with her recovery, which is much appreciated.    Thoracentesis from the left removed ~500 ml.  Pt tolerated it better than the first time, but still had more pain afterwards.   Assessment & Plan:   Principal Problem:   Shortness of breath Active Problems:   History of pyelonephritis   Hyperemesis gravidarum   Hyperemesis affecting pregnancy, antepartum   Pregnancy   Anemia of pregnancy in second trimester   Renal abscess   Pleural effusion   Status post  thoracentesis  Exertional dyspnea.   Multifactorial with current pregnancy, dilutional anemia and left pleural effusion.  Troponin, BNP within normal limit.  Procalcitonin at 0.16, CRP of 5.7 and ESR of 85 with history of recent left renal abscess and urologic procedures.  D-dimer was mildly elevated but CTA was negative for PE. --transferred to stepdown for worsening dyspnea.  Currently seems improving, weaned off from oxygen.    Exudative pleural effusion on the left Thoracentesis was done, initial labs with exudative fluid.  Cultures negative so far. --likely reactive due to pyelo --CXR on 8/4 showed worsening left pleural effusion and consolidation. --repeat thoracentesis today, 450 mL translucent, green-tinged fluid removed. --f/u fluid studies  severe left-sided chest pain/spasms  Left shoulder pain --developed after thoracentesis, maybe hit a nerve. --gabapentin d/c'ed as pt didn't think it helped Plan: --avoid IV pain meds --oral liquid dilaudid, 1.5 mg q4h PRN --cont IV robaxin scheduled  Hypomagnesemia/hypokalemia.   --replete with IV since pt is having trouble swallowing pills now.  History of recent left renal abscess/pyelonephritis/nephrolithiasis.  Patient had recent lithotripsy done, also has a stent placement followed by removal due to worsening pain.  ID and urology is also following. --no drainable collection, per IR Plan: --cont ceftriaxone --abx duration 4 weeks, per ID, till resolution of the renal abscess. --will discharge home with PICC line to continue abx --PICC line placement scheduled for tomorrow  Fetal monitoring. -Per OB/GYN.  Diastolic dysfunction of unclear significance --grade II diastolic dysfunction from Echo on 03/27/21.     DVT prophylaxis: Lovenox SQ Code Status: Full code  Family Communication:  Status is: inpatient Dispo:   The patient is from: home Anticipated d/c is  to: home Anticipated d/c date is: tomorrow Patient currently is  not medically stable to d/c due to: monitor overnight after thora, since pt had a lot of problem after the first one.     Objective: Vitals:   04/01/21 1234 04/01/21 1251 04/01/21 1328 04/01/21 1539  BP: (!) 132/105 112/77 114/76 121/81  Pulse: (!) 125 (!) 110 (!) 116 91  Resp:    16  Temp:    97.8 F (36.6 C)  TempSrc:    Oral  SpO2: 97% 97% 98%   Weight:      Height:        Intake/Output Summary (Last 24 hours) at 04/01/2021 1551 Last data filed at 04/01/2021 1456 Gross per 24 hour  Intake 117.4 ml  Output 1200 ml  Net -1082.6 ml   Filed Weights   03/24/21 2100  Weight: 69.4 kg    Examination:  Constitutional: NAD, AAOx3 HEENT: conjunctivae and lids normal, EOMI CV: No cyanosis.   RESP: normal respiratory effort, on RA Extremities: No effusions, edema in BLE SKIN: warm, dry Neuro: II - XII grossly intact.   Psych: Normal mood and affect.  Appropriate judgement and reason   Procedures:  Thoracentesis  Antimicrobials:  Ceftriaxone  Data Reviewed: I have personally reviewed following labs and imaging studies  CBC: Recent Labs  Lab 03/28/21 0415 03/30/21 0504 03/31/21 0452 04/01/21 0604  WBC 9.2 8.8 8.9 10.1  NEUTROABS 7.1  --   --   --   HGB 9.3* 9.9* 9.8* 9.4*  HCT 28.8* 30.1* 29.4* 28.4*  MCV 91.4 89.3 90.7 89.0  PLT 419* 445* 421* 333*   Basic Metabolic Panel: Recent Labs  Lab 03/26/21 0651 03/27/21 0953 03/28/21 0415 03/30/21 0504 03/31/21 0452 04/01/21 0604  NA  --   --  132* 136 137 135  K  --   --  3.3* 3.5 3.2* 4.0  CL  --   --  101 105 107 105  CO2  --   --  '23 24 23 24  ' GLUCOSE  --   --  76 93 116* 103*  BUN  --   --  '7 7 9 9  ' CREATININE 0.60  --  0.76 0.54 0.71 0.66  CALCIUM  --   --  8.0* 8.4* 8.1* 8.3*  MG  --  1.7 1.6* 2.0 1.8 1.6*  PHOS  --  3.3 2.9  --   --   --    GFR: Estimated Creatinine Clearance: 94.4 mL/min (by C-G formula based on SCr of 0.66 mg/dL). Liver Function Tests: Recent Labs  Lab 03/27/21 0953  03/31/21 0452  AST 92* 52*  ALT 147* 83*  ALKPHOS 106 108  BILITOT 1.2 0.6  PROT 6.5 5.9*  ALBUMIN 2.1* 2.0*   No results for input(s): LIPASE, AMYLASE in the last 168 hours. No results for input(s): AMMONIA in the last 168 hours. Coagulation Profile: No results for input(s): INR, PROTIME in the last 168 hours.  Cardiac Enzymes: No results for input(s): CKTOTAL, CKMB, CKMBINDEX, TROPONINI in the last 168 hours. BNP (last 3 results) No results for input(s): PROBNP in the last 8760 hours. HbA1C: No results for input(s): HGBA1C in the last 72 hours. CBG: Recent Labs  Lab 03/26/21 2117  GLUCAP 94   Lipid Profile: No results for input(s): CHOL, HDL, LDLCALC, TRIG, CHOLHDL, LDLDIRECT in the last 72 hours. Thyroid Function Tests: No results for input(s): TSH, T4TOTAL, FREET4, T3FREE, THYROIDAB in the last 72 hours.  Anemia Panel: No results  for input(s): VITAMINB12, FOLATE, FERRITIN, TIBC, IRON, RETICCTPCT in the last 72 hours.  Sepsis Labs: Recent Labs  Lab 03/27/21 0953 03/28/21 0415  PROCALCITON 0.29  0.30 0.50    Recent Results (from the past 240 hour(s))  Microscopic Examination     Status: Abnormal   Collection Time: 03/24/21 12:59 PM   Urine  Result Value Ref Range Status   WBC, UA >30 (A) 0 - 5 /hpf Final   RBC 11-30 (A) 0 - 2 /hpf Final   Epithelial Cells (non renal) 0-10 0 - 10 /hpf Final   Casts Present (A) None seen /lpf Final   Cast Type Hyaline casts N/A Final   Mucus, UA Present (A) Not Estab. Final   Bacteria, UA Moderate (A) None seen/Few Final  CULTURE, URINE COMPREHENSIVE     Status: None   Collection Time: 03/24/21  1:50 PM   Specimen: Urine   UR  Result Value Ref Range Status   Urine Culture, Comprehensive Final report  Final   Organism ID, Bacteria Comment  Final    Comment: No growth in 36 - 48 hours.  SARS CORONAVIRUS 2 (TAT 6-24 HRS) Nasopharyngeal Nasopharyngeal Swab     Status: None   Collection Time: 03/24/21  6:15 PM   Specimen:  Nasopharyngeal Swab  Result Value Ref Range Status   SARS Coronavirus 2 NEGATIVE NEGATIVE Final    Comment: (NOTE) SARS-CoV-2 target nucleic acids are NOT DETECTED.  The SARS-CoV-2 RNA is generally detectable in upper and lower respiratory specimens during the acute phase of infection. Negative results do not preclude SARS-CoV-2 infection, do not rule out co-infections with other pathogens, and should not be used as the sole basis for treatment or other patient management decisions. Negative results must be combined with clinical observations, patient history, and epidemiological information. The expected result is Negative.  Fact Sheet for Patients: SugarRoll.be  Fact Sheet for Healthcare Providers: https://www.woods-mathews.com/  This test is not yet approved or cleared by the [place] FDA and  has been authorized for detection and/or diagnosis of SARS-CoV-2 by FDA under an Emergency Use Authorization (EUA). This EUA will remain  in effect (meaning this test can be used) for the duration of the COVID-19 declaration under Se ction 564(b)(1) of the Act, 21 U.S.C. section 360bbb-3(b)(1), unless the authorization is terminated or revoked sooner.  Performed at [facility], [place] [address], [place], [place] [address]   Urine Culture     Status: Abnormal   Collection Time: 03/24/21 10:49 PM   Specimen: Urine, Clean Catch  Result Value Ref Range Status   Specimen Description   Final    URINE, CLEAN CATCH Performed at [facility], [address] [place], [place] [address]    Special Requests   Final    NONE Performed at [facility], [place], [place] [address]    Culture 20,000 COLONIES/mL YEAST (A)  Final   Report Status 03/26/2021 FINAL  Final  Culture, blood (routine x 2)     Status: None   Collection Time: 03/25/21  9:06 AM   Specimen: BLOOD RIGHT HAND  Result Value Ref Range Status    Specimen Description BLOOD RIGHT HAND  Final   Special Requests   Final    BOTTLES DRAWN AEROBIC AND ANAEROBIC Blood Culture adequate volume   Culture   Final    NO GROWTH 5 DAYS Performed at [facility], [address], [place], [place] [address]  Report Status 03/30/2021 FINAL  Final  Culture, blood (routine x 2)     Status: None   Collection Time: 03/25/21  9:20 AM   Specimen: BLOOD LEFT HAND  Result Value Ref Range Status   Specimen Description BLOOD LEFT HAND  Final   Special Requests   Final    BOTTLES DRAWN AEROBIC AND ANAEROBIC Blood Culture adequate volume   Culture   Final    NO GROWTH 5 DAYS Performed at [facility], [address], [place], [place] [address]    Report Status 03/30/2021 FINAL  Final  Acid Fast Smear (AFB)     Status: None   Collection Time: 03/26/21 10:30 AM   Specimen: PATH Cytology Pleural fluid  Result Value Ref Range Status   AFB Specimen Processing Concentration  Final   Acid Fast Smear Negative  Final    Comment: (NOTE) Performed At: [facility] [address] [place], [place] [id number] Rush Farmer MD CW:[id number]    Source (AFB) PLEURAL  Final    Comment: Performed at [facility], [address] [place], [place] [address]  Body fluid culture w Gram Stain     Status: None   Collection Time: 03/26/21 10:30 AM   Specimen: PATH Cytology Pleural fluid  Result Value Ref Range Status   Specimen Description   Final    PLEURAL Performed at [facility], [address] [place], [place] [address]    Special Requests   Final    PLEURAL Performed at [facility], [address] [place], [place] [address]    Gram Stain   Final    RARE WBC PRESENT,BOTH PMN AND MONONUCLEAR NO ORGANISMS SEEN    Culture   Final    NO GROWTH 3 DAYS Performed at [facility], [place] [address] [place], [place] [address]    Report Status 03/30/2021 FINAL  Final  MRSA Next Gen by PCR,  Nasal     Status: None   Collection Time: 03/26/21 10:06 PM   Specimen: Nasal Mucosa; Nasal Swab  Result Value Ref Range Status   MRSA by PCR Next Gen NOT DETECTED NOT DETECTED Final    Comment: (NOTE) The GeneXpert MRSA Assay (FDA approved for NASAL specimens only), is one component of a comprehensive MRSA colonization surveillance program. It is not intended to diagnose MRSA infection nor to guide or monitor treatment for MRSA infections. Test performance is not FDA approved in patients less than 37 years old. Performed at [facility], [address] [place],  [address]       Radiology Studies: [facility] Chest [place] 1 View  Result Date: 04/01/2021 CLINICAL DATA:  35 year old female with left pleural effusion status post thoracentesis. EXAM: PORTABLE CHEST - 1 VIEW COMPARISON:  03/30/2021 FINDINGS: The mediastinal contours are within normal limits. No cardiomegaly. Blunting of the left costophrenic angle, with decreased size of pleural effusion from comparison, now trace. No pneumothorax. Persistent left lower lobar consolidative opacities. No acute osseous abnormality. IMPRESSION: 1. No pneumothorax after left thoracentesis. Decreased size of left pleural effusion. 2. Persistent left lower lobe consolidative opacities. Ruthann Cancer, MD [facility] Radiology Electronically Signed   By: Ruthann Cancer MD   On: 04/01/2021 13:07   [place] EKG SITE RITE  Result Date: 04/01/2021 If Site Rite image not attached, placement could not be confirmed due to current cardiac rhythm.  US THORACENTESIS ASP PLEURAL SPACE W/IMG GUIDE  Result Date: 04/01/2021 INDICATION: 35 year old  female with left pleural effusion. EXAM: ULTRASOUND GUIDED LEFT THORACENTESIS MEDICATIONS: None. COMPLICATIONS: None immediate. PROCEDURE: An ultrasound guided thoracentesis was thoroughly discussed with the patient and questions answered. The benefits, risks, alternatives and  complications were also discussed. The patient understands and wishes to proceed with the procedure. Written consent was obtained. Ultrasound was performed to localize and mark an adequate pocket of fluid in the left chest. The area was then prepped and draped in the normal sterile fashion. 1% Lidocaine was used for local anesthesia. Under ultrasound guidance a 19 gauge, 7-cm, Yueh catheter was introduced. Thoracentesis was performed. The catheter was removed and a dressing applied. FINDINGS: A total of approximately 450 mL of translucent, green tinged fluid was removed. IMPRESSION: Successful ultrasound guided left thoracentesis yielding 450 mL of pleural fluid. Ruthann Cancer, MD [facility] Radiology Electronically Signed   By: Ruthann Cancer MD   On: 04/01/2021 13:08    Scheduled Meds:  enoxaparin (LOVENOX) injection  40 mg Subcutaneous Q24H   feeding supplement  1 Container Oral TID BM   prenatal multivitamin  1 tablet Oral Q1200   Continuous Infusions:  sodium chloride 10 mL/hr at 03/31/21 1905   cefTRIAXone (ROCEPHIN)  IV 2 g (04/01/21 0910)   methocarbamol (ROBAXIN) IV 500 mg (04/01/21 1450)     LOS: 7 days    Enzo Bi, MD [facility]  If 7PM-7AM, please contact night-coverage [email or web address]  04/01/2021, 3:51 PM

## 2021-04-01 NOTE — Progress Notes (Signed)
Antepartum Progress Note  Subjective:     Patient ID: Laura Sosa is a 35 y.o. G94P2002 female [redacted]w[redacted]d, Estimated Date of Delivery: 07/05/21 consistent with first trimester sono who was admitted for possible left renal abscess, unresponsive to IM antibiotic therapy. Also s/p left ureteroscopy and lithotripsy  and ureteral stent placement with subsequent removal in previous admission (7/11-7/13).  HD# 7.   Hospital course complicated by large left pleural effusion, POD#6 s/p thoracentesis. Developed postthoracentesis left-sided pain and spasms along with some shortness of breath.  Also with fluid recollection on repeat CXR.   Subjective:  Patient reports that she actually has been resting comfortably for the past night or two.  Notes the Ambien helps her to relax and go to sleep. Uses the nasal cannula oxygen at night while resting for comfort. Is up and ambulating during the day without it. Denies any SOB or chest pain currently.  Is beginning to take in more PO (various snacks and sodas noted in the room). Notes pain is beginning to decrease, not having to take as much medication.   Reports new PICC line placed in right arm due to difficulties with infusing potassium. Now has bilateral IV access.     Review of Systems Denies contractions, leakage of fluids, vaginal bleeding, and reports good fetal movement.     Objective:   Vitals:   03/31/21 1915 03/31/21 2243 04/01/21 0332 04/01/21 0810  BP: 101/71 108/66 101/72 106/60  Pulse: 91 89 84 76  Resp: 18 18 18 18   Temp: 97.6 F (36.4 C) 97.6 F (36.4 C) 97.6 F (36.4 C) 97.7 F (36.5 C)  TempSrc: Oral Oral Oral Oral  SpO2: 97% 98% 96% 98%  Weight:      Height:       I/O last 3 completed shifts: In: 218.9 [IV Piggyback:218.9] Out: 1100 [Urine:1100] No intake/output data recorded.   General appearance: alert and no distress Lungs:  mildly decreased breath sounds at left lower lung base, improved since yesterday.   Heart:  regular rate and rhythm, S1, S2 normal, no murmur, click, rub or gallop Abdomen: soft, non-tender; bowel sounds normal; no masses,  no organomegaly Pelvic: deferred Extremities: extremities normal, atraumatic, no cyanosis or edema   FHT: 140 bpm (overnight)     Meds:  Scheduled Meds:  enoxaparin (LOVENOX) injection  40 mg Subcutaneous Q24H   feeding supplement  1 Container Oral TID BM   prenatal multivitamin  1 tablet Oral Q1200   Continuous Infusions:  sodium chloride 10 mL/hr at 03/31/21 1905   cefTRIAXone (ROCEPHIN)  IV 2 g (04/01/21 0910)   methocarbamol (ROBAXIN) IV Stopped (04/01/21 0430)   PRN Meds:.sodium chloride, calcium carbonate, HYDROmorphone HCl, hydrOXYzine, ondansetron (ZOFRAN) IV, promethazine, zolpidem   Labs:  CBC Latest Ref Rng & Units 04/01/2021 03/31/2021 03/30/2021  WBC 4.0 - 10.5 K/uL 10.1 8.9 8.8  Hemoglobin 12.0 - 15.0 g/dL 05/30/2021) 6.5(L) 9.3(T)  Hematocrit 36.0 - 46.0 % 28.4(L) 29.4(L) 30.1(L)  Platelets 150 - 400 K/uL 413(H) 421(H) 445(H)    CMP Latest Ref Rng & Units 04/01/2021 03/31/2021 03/30/2021  Glucose 70 - 99 mg/dL 05/30/2021) 779(T) 93  BUN 6 - 20 mg/dL 9 9 7   Creatinine 0.44 - 1.00 mg/dL 903(E 0.92  Sodium 135 - 145 mmol/L 135 137 136  Potassium 3.5 - 5.1 mmol/L 4.0 3.2(L) 3.5  Chloride 98 - 111 mmol/L 105 107 105  CO2 22 - 32 mmol/L 24 23 24   Calcium 8.9 - 10.3 mg/dL 8.3(L) 8.1(L)  8.4(L)  Total Protein 6.5 - 8.1 g/dL - 5.9(L) -  Total Bilirubin 0.3 - 1.2 mg/dL - 0.6 -  Alkaline Phos 38 - 126 U/L - 108 -  AST 15 - 41 U/L - 52(H) -  ALT 0 - 44 U/L - 83(H) -     Imaging:  DG Chest Port 1 View CLINICAL DATA:  Left pleural effusion  EXAM: PORTABLE CHEST 1 VIEW  COMPARISON:  03/26/2021  FINDINGS: Single frontal view of the chest demonstrates enlarging left pleural effusion, with progressive left basilar consolidation. No pneumothorax. Right chest is clear.  IMPRESSION: 1. Progressive left basilar consolidation and  effusion.  Electronically Signed   By: Sharlet Salina M.D.   On: 03/30/2021 16:14    Assessment:  35 y.o. female [redacted]w[redacted]d, with:    1. Renal abscess   2. Pregnancy   3. Recurrent left pleural effusion   4. Status post thoracentesis   5. SOB (shortness of breath)   6. Hypokalemia  7.  Hypomagnesemia        Plan:   Renal abscess (left) To continue management per ID/Urology recommendations. Patient with slight decrease in size of abscess on most recent ultrasound yesterday.  Receiving daily injections of IV Ceftriaxone.  Plan is to continue IV antibiotics for a total of 4 weeks. No longer will attempt drainage. WBC count remains stable.   Pregnancy at [redacted] weeks gestation Continue to monitor fetal heart tones q shift. Fetal status overall reassuring.  Continue Lovenox for DVT prophylaxis Anemia of pregnancy normal, can treat with oral iron.   Pleural effusion  S/p thoracentesis earlier this admission with recollection of fluid, exudate. Cultures negative.  Patient still able to maintain sats on room air. Uses oxygen for comfort when sleeping.  Plan is to repeat thoracentesis sometime in next several days.  Patient reports that she feels anxious about having procedure done. Discussed option of pre-procedure anxiolytic (Valium), patient agrees.  Pain management  Continue liquid form of medications as much as possible. IV pain medication only for breakthrough pain.  Left-sided weakness  Weakness improved. Still notes occasional left-sided spasms, however improving.  Continue to encourage ambulation as much as possible.  Electrolyte disturbances (hypokalemia, hypomagnesemia)  Continue IV repletion.  Will continue to follow.    A total of 15 minutes were spent face-to-face with the patient during this encounter with counseling and face-to-face interaction.    Hildred Laser, MD Obstetrics and Gynecology [facility]

## 2021-04-01 NOTE — Progress Notes (Signed)
Pt taken by ultrasound for thoracentesis

## 2021-04-02 ENCOUNTER — Inpatient Hospital Stay: Payer: Medicaid Other

## 2021-04-02 ENCOUNTER — Encounter: Payer: Self-pay | Admitting: Urology

## 2021-04-02 DIAGNOSIS — O99512 Diseases of the respiratory system complicating pregnancy, second trimester: Secondary | ICD-10-CM | POA: Diagnosis not present

## 2021-04-02 DIAGNOSIS — J90 Pleural effusion, not elsewhere classified: Secondary | ICD-10-CM | POA: Diagnosis not present

## 2021-04-02 DIAGNOSIS — R0602 Shortness of breath: Secondary | ICD-10-CM | POA: Diagnosis not present

## 2021-04-02 DIAGNOSIS — Z3A25 25 weeks gestation of pregnancy: Secondary | ICD-10-CM | POA: Diagnosis not present

## 2021-04-02 DIAGNOSIS — E878 Other disorders of electrolyte and fluid balance, not elsewhere classified: Secondary | ICD-10-CM | POA: Diagnosis not present

## 2021-04-02 DIAGNOSIS — O99012 Anemia complicating pregnancy, second trimester: Secondary | ICD-10-CM | POA: Diagnosis not present

## 2021-04-02 DIAGNOSIS — O26833 Pregnancy related renal disease, third trimester: Secondary | ICD-10-CM | POA: Diagnosis not present

## 2021-04-02 DIAGNOSIS — N151 Renal and perinephric abscess: Secondary | ICD-10-CM | POA: Diagnosis not present

## 2021-04-02 DIAGNOSIS — N2889 Other specified disorders of kidney and ureter: Secondary | ICD-10-CM | POA: Diagnosis not present

## 2021-04-02 LAB — RUPTURE OF MEMBRANE (ROM)PLUS: Rom Plus: NEGATIVE

## 2021-04-02 LAB — BASIC METABOLIC PANEL
Anion gap: 7 (ref 5–15)
BUN: 9 mg/dL (ref 6–20)
CO2: 27 mmol/L (ref 22–32)
Calcium: 8.4 mg/dL — ABNORMAL LOW (ref 8.9–10.3)
Chloride: 101 mmol/L (ref 98–111)
Creatinine, Ser: 0.66 mg/dL (ref 0.44–1.00)
GFR, Estimated: >60 mL/min (ref >60)
Glucose, Bld: 96 mg/dL (ref 70–99)
Potassium: 4 mmol/L (ref 3.5–5.1)
Sodium: 135 mmol/L (ref 135–145)

## 2021-04-02 LAB — CBC
HCT: 31.1 % — ABNORMAL LOW (ref 36.0–46.0)
Hemoglobin: 10 g/dL — ABNORMAL LOW (ref 12.0–15.0)
MCH: 28.9 pg (ref 26.0–34.0)
MCHC: 32.2 g/dL (ref 30.0–36.0)
MCV: 89.9 fL (ref 80.0–100.0)
Platelets: 395 10*3/uL (ref 150–400)
RBC: 3.46 MIL/uL — ABNORMAL LOW (ref 3.87–5.11)
RDW: 13.8 % (ref 11.5–15.5)
WBC: 8.4 10*3/uL (ref 4.0–10.5)
nRBC: 0 % (ref 0.0–0.2)

## 2021-04-02 LAB — LACTATE DEHYDROGENASE: LDH: 110 U/L (ref 98–192)

## 2021-04-02 LAB — MAGNESIUM: Magnesium: 1.8 mg/dL (ref 1.7–2.4)

## 2021-04-02 MED ORDER — HYDROXYZINE HCL 25 MG PO TABS: 25.0000 mg | ORAL_TABLET | Freq: Three times a day (TID) | ORAL | 1 refills | Status: DC | PRN

## 2021-04-02 MED ORDER — HYDROMORPHONE HCL 1 MG/ML PO LIQD: 1.5000 mg | Freq: Four times a day (QID) | ORAL | 0 refills | Status: AC | PRN

## 2021-04-02 MED ORDER — SODIUM CHLORIDE 0.9% FLUSH
10.0000 mL | INTRAVENOUS | Status: DC | PRN
  Administered 2021-04-02: 10 mL

## 2021-04-02 MED ORDER — SODIUM CHLORIDE 0.9% FLUSH
10.0000 mL | Freq: Two times a day (BID) | INTRAVENOUS | Status: DC
  Administered 2021-04-02: 10 mL

## 2021-04-02 MED ORDER — CEFTRIAXONE IV (FOR PTA / DISCHARGE USE ONLY): 2.0000 g | INTRAVENOUS | 0 refills | Status: DC

## 2021-04-02 MED ORDER — HYDROMORPHONE HCL 1 MG/ML PO LIQD
1.5000 mg | Freq: Four times a day (QID) | ORAL | Status: DC | PRN
  Administered 2021-04-02 – 2021-04-03 (×4): 1.5 mg via ORAL
  Filled 2021-04-02 (×4): qty 2

## 2021-04-02 MED ORDER — CHLORHEXIDINE GLUCONATE CLOTH 2 % EX PADS
6.0000 | MEDICATED_PAD | Freq: Every day | CUTANEOUS | Status: DC
  Administered 2021-04-03: 6 via TOPICAL

## 2021-04-02 NOTE — Treatment Plan (Addendum)
Diagnosis: Left pyelonephritis, renal abscess versus phlegmon Left renal stone- had lithotripsy Baseline Creatinine < 1  Culture Result: e.coli  No Known Allergies  OPAT Orders Discharge antibiotics: Ceftriaxone 2 grams IV every 24 hours until 04/21/21  [facility] Care Per Protocol:including placement of biopatch  Labs weekly while on IV antibiotics: _X_ CBC with differential  _X_ CMP   _X_ Please pull PIC at completion of IV antibiotics _ Fax weekly labs to  Dr.Brantlee Hinde [phone]  Clinic Follow Up Appt: 04/13/21 at noon   Call [phone] with any questions

## 2021-04-02 NOTE — Progress Notes (Signed)
PHARMACY CONSULT NOTE FOR:  OUTPATIENT  PARENTERAL ANTIBIOTIC THERAPY (OPAT)  Indication: Left pyelonephritis, renal abscess versus phlegmon Left renal stone- had lithotripsy Regimen: Ceftriaxone 2 grams IV every 24 hours End date: 04/21/21  IV antibiotic discharge orders are pended. To discharging provider:  please sign these orders via discharge navigator,  Select New Orders & click on the button choice - Manage This Unsigned Work.     Thank you for allowing pharmacy to be a part of this patient's care.  Jamya Starry A Sadie Pickar 04/02/2021, 10:01 AM

## 2021-04-02 NOTE — Progress Notes (Signed)
PROGRESS NOTE  Consult progress note.  Laura Sosa  CXK:[id number] DOB: November 16, 1985 DOA: 03/24/2021 PCP: [place]   Brief Narrative: Taken from consult note. Laura Sosa is a 35 y.o. female with medical history significant for history of bilateral kidney stones, migraine headaches, pyelonephritis, G3P2002, [redacted] weeks pregnant, who was admitted to the hospital under obstetrics and gynecologic service for left flank pain, pyuria, hematuria, pain management and IV antibiotics.   She was recently treated for urinary tract infection and left renal abscess failing outpatient therapy with 9 doses of Rocephin outpatient.  She is status post left ureteral stent exchange on 03/06/2021 and subsequent stent removal on 03/08/2021.  Per ID she will need 3 to 4 weeks of antibiotics for this persistent infection.  Imaging without any new abscess.   Hospitalist service was consulted for shortness of breath, worse with exertion.  Found to have large left pleural effusion-thoracentesis with removal of 500 cc of exudative fluid, cultures pending.  Developed postthoracentesis left-sided pain and spasms along with some shortness of breath-transfer to stepdown overnight due to worsening pain and dyspnea.  Repeat chest x-ray with moderate left-sided pleural effusion, looks pretty much the same as before thoracentesis.  No pneumothorax.  Subjective: Continued to complain of pain and spasm on the left side.    After discussion with Dr. Lanney Gins and Dr. Delaine Lame, pt was cleared for discharge with home abx infusion, however, HH abx infusion was not able to be set up today due to insurance reasons because pt is going to [place].   Assessment & Plan:   Principal Problem:   Shortness of breath Active Problems:   History of pyelonephritis   Hyperemesis gravidarum   Hyperemesis affecting pregnancy, antepartum   Pregnancy   Anemia of pregnancy in second trimester   Renal abscess   Pleural  effusion   Status post thoracentesis  Exertional dyspnea.   Multifactorial with current pregnancy, dilutional anemia and left pleural effusion.  Troponin, BNP within normal limit.  Procalcitonin at 0.16, CRP of 5.7 and ESR of 85 with history of recent left renal abscess and urologic procedures.  D-dimer was mildly elevated but CTA was negative for PE. --transferred to stepdown for worsening dyspnea.  Improved and weaned off from oxygen.    Exudative pleural effusion on the left Thoracentesis was done, initial labs with exudative fluid.  Cultures negative so far. --likely reactive due to pyelo --CXR on 8/4 showed worsening left pleural effusion and consolidation. --repeat thoracentesis 8/6, 450 mL translucent, green-tinged fluid removed. --f/u fluid studies --per pulm Dr. Lanney Gins, can discharge with outpatient followup with him.  severe left-sided chest pain/spasms  Left shoulder pain --developed after thoracentesis, maybe hit a nerve. --gabapentin d/c'ed as pt didn't think it helped Plan: --avoid IV pain meds --taper oral liquid dilaudid to 1.5 mg q6h PRN --cont IV robaxin scheduled  Hypomagnesemia/hypokalemia.   --replete PRN  History of recent left renal abscess/pyelonephritis/nephrolithiasis.  Patient had recent lithotripsy done, also has a stent placement followed by removal due to worsening pain.  ID and urology is also following. --no drainable collection, per IR Plan: --cont ceftriaxone, until 04/21/21 --plan to discharge home with PICC line to continue abx  Fetal monitoring. -Per OB/GYN.  Diastolic dysfunction of unclear significance --grade II diastolic dysfunction from Echo on 03/27/21.  Possibly due to pregnancy.    DVT prophylaxis: Lovenox SQ Code Status: Full code  Family Communication: mother updated at bedside today Status is: inpatient Dispo:   The patient is from:  home Anticipated d/c is to: home Anticipated d/c date is: tomorrow Patient currently is  medically stable to d/c.   Objective: Vitals:   04/02/21 0331 04/02/21 0931 04/02/21 1100 04/02/21 1624  BP: 114/75 107/74 97/63 109/69  Pulse: (!) 106 93 97 91  Resp: _0 Temp: 98.1 F (36.7 C) 98 F (36.7 C) 97.9 F (36.6 C) 97.8 F (36.6 C)  TempSrc: Oral Oral Oral Oral  SpO2: 98% 96%    Weight:      Height:        Intake/Output Summary (Last 24 hours) at 04/02/2021 1828 Last data filed at 04/02/2021 0800 Gross per 24 hour  Intake 100 ml  Output 900 ml  Net -800 ml   Filed Weights   03/24/21 2100  Weight: 69.4 kg    Examination:  Constitutional: NAD, AAOx3, sitting up in bed HEENT: conjunctivae and lids normal, EOMI CV: No cyanosis.   RESP: normal respiratory effort, on RA Extremities: No effusions, edema in BLE SKIN: warm, dry Neuro: II - XII grossly intact.     Procedures:  Thoracentesis  Antimicrobials:  Ceftriaxone  Data Reviewed: I have personally reviewed following labs and imaging studies  CBC: Recent Labs  Lab 03/28/21 0415 03/30/21 0504 03/31/21 0452 04/01/21 0604 04/02/21 0630  WBC 9.2 8.8 8.9 10.1 8.4  NEUTROABS 7.1  --   --   --   --   HGB 9.3* 9.9* 9.8* 9.4* 10.0*  HCT 28.8* 30.1* 29.4* 28.4* 31.1*  MCV 91.4 89.3 90.7 89.0 89.9  PLT 419* 445* 421* 413* 749   Basic Metabolic Panel: Recent Labs  Lab 03/27/21 0953 03/28/21 0415 03/30/21 0504 03/31/21 0452 04/01/21 0604 04/02/21 0630  NA  --  132* 136 137 135 135  K  --  3.3* 3.5 3.2* 4.0 4.0  CL  --  101 105 107 105 101  CO2  --  _1 GLUCOSE  --  76 93 116* 103* 96  BUN  --  _2 CREATININE  --  0.76 0.54 0.71 0.66 0.66  CALCIUM  --  8.0* 8.4* 8.1* 8.3* 8.4*  MG 1.7 1.6* 2.0 1.8 1.6* 1.8  PHOS 3.3 2.9  --   --   --   --    GFR: Estimated Creatinine Clearance: 94.4 mL/min (by C-G formula based on SCr of 0.66 mg/dL). Liver Function Tests: Recent Labs  Lab 03/27/21 0953 03/31/21 0452  AST 92* 52*  ALT 147* 83*  ALKPHOS 106 108  BILITOT 1.2  0.6  PROT 6.5 5.9*  ALBUMIN 2.1* 2.0*   No results for input(s): LIPASE, AMYLASE in the last 168 hours. No results for input(s): AMMONIA in the last 168 hours. Coagulation Profile: No results for input(s): INR, PROTIME in the last 168 hours.  Cardiac Enzymes: No results for input(s): CKTOTAL, CKMB, CKMBINDEX, TROPONINI in the last 168 hours. BNP (last 3 results) No results for input(s): PROBNP in the last 8760 hours. HbA1C: No results for input(s): HGBA1C in the last 72 hours. CBG: Recent Labs  Lab 03/26/21 2117  GLUCAP 94   Lipid Profile: No results for input(s): CHOL, HDL, LDLCALC, TRIG, CHOLHDL, LDLDIRECT in the last 72 hours. Thyroid Function Tests: No results for input(s): TSH, T4TOTAL, FREET4, T3FREE, THYROIDAB in the last 72 hours.  Anemia Panel: No results for input(s): VITAMINB12, FOLATE, FERRITIN, TIBC, IRON, RETICCTPCT in the last 72 hours.  Sepsis Labs: Recent Labs  Lab 03/27/21 [phone]  03/28/21 0415  PROCALCITON 0.29  0.30 0.50    Recent Results (from the past 240 hour(s))  Microscopic Examination     Status: Abnormal   Collection Time: 03/24/21 12:59 PM   Urine  Result Value Ref Range Status   WBC, UA >30 (A) 0 - 5 /hpf Final   RBC 11-30 (A) 0 - 2 /hpf Final   Epithelial Cells (non renal) 0-10 0 - 10 /hpf Final   Casts Present (A) None seen /lpf Final   Cast Type Hyaline casts N/A Final   Mucus, UA Present (A) Not Estab. Final   Bacteria, UA Moderate (A) None seen/Few Final  CULTURE, URINE COMPREHENSIVE     Status: None   Collection Time: 03/24/21  1:50 PM   Specimen: Urine   UR  Result Value Ref Range Status   Urine Culture, Comprehensive Final report  Final   Organism ID, Bacteria Comment  Final    Comment: No growth in 36 - 48 hours.  SARS CORONAVIRUS 2 (TAT 6-24 HRS) Nasopharyngeal Nasopharyngeal Swab     Status: None   Collection Time: 03/24/21  6:15 PM   Specimen: Nasopharyngeal Swab  Result Value Ref Range Status   SARS Coronavirus 2  NEGATIVE NEGATIVE Final    Comment: (NOTE) SARS-CoV-2 target nucleic acids are NOT DETECTED.  The SARS-CoV-2 RNA is generally detectable in upper and lower respiratory specimens during the acute phase of infection. Negative results do not preclude SARS-CoV-2 infection, do not rule out co-infections with other pathogens, and should not be used as the sole basis for treatment or other patient management decisions. Negative results must be combined with clinical observations, patient history, and epidemiological information. The expected result is Negative.  Fact Sheet for Patients: SugarRoll.be  Fact Sheet for Healthcare Providers: https://www.woods-mathews.com/  This test is not yet approved or cleared by the [place] FDA and  has been authorized for detection and/or diagnosis of SARS-CoV-2 by FDA under an Emergency Use Authorization (EUA). This EUA will remain  in effect (meaning this test can be used) for the duration of the COVID-19 declaration under Se ction 564(b)(1) of the Act, 21 U.S.C. section 360bbb-3(b)(1), unless the authorization is terminated or revoked sooner.  Performed at [facility], [place] [address], [place], [place] [address]   Urine Culture     Status: Abnormal   Collection Time: 03/24/21 10:49 PM   Specimen: Urine, Clean Catch  Result Value Ref Range Status   Specimen Description   Final    URINE, CLEAN CATCH Performed at [facility], [address] [place], [place] [address]    Special Requests   Final    NONE Performed at [facility], [place], [place] [address]    Culture 20,000 COLONIES/mL YEAST (A)  Final   Report Status 03/26/2021 FINAL  Final  Culture, blood (routine x 2)     Status: None   Collection Time: 03/25/21  9:06 AM   Specimen: BLOOD RIGHT HAND  Result Value Ref Range Status   Specimen Description BLOOD RIGHT HAND  Final   Special Requests    Final    BOTTLES DRAWN AEROBIC AND ANAEROBIC Blood Culture adequate volume   Culture   Final    NO GROWTH 5 DAYS Performed at [facility], [address], [place], [place] [address]    Report Status 03/30/2021 FINAL  Final  Culture, blood (routine x 2)     Status: None   Collection Time:  03/25/21  9:20 AM   Specimen: BLOOD LEFT HAND  Result Value Ref Range Status   Specimen Description BLOOD LEFT HAND  Final   Special Requests   Final    BOTTLES DRAWN AEROBIC AND ANAEROBIC Blood Culture adequate volume   Culture   Final    NO GROWTH 5 DAYS Performed at [facility], [address] [place], [place] [address]    Report Status 03/30/2021 FINAL  Final  Acid Fast Smear (AFB)     Status: None   Collection Time: 03/26/21 10:30 AM   Specimen: PATH Cytology Pleural fluid  Result Value Ref Range Status   AFB Specimen Processing Concentration  Final   Acid Fast Smear Negative  Final    Comment: (NOTE) Performed At: [facility] [address] [place], [place] [id number] Rush Farmer MD UV:[id number]    Source (AFB) PLEURAL  Final    Comment: Performed at [facility], [address] [place], [place] [address]  Body fluid culture w Gram Stain     Status: None   Collection Time: 03/26/21 10:30 AM   Specimen: PATH Cytology Pleural fluid  Result Value Ref Range Status   Specimen Description   Final    PLEURAL Performed at [facility], [address], [place], [place] [address]    Special Requests   Final    PLEURAL Performed at [facility], [address] [place], [place] [address]    Gram Stain   Final    RARE WBC PRESENT,BOTH PMN AND MONONUCLEAR NO ORGANISMS SEEN    Culture   Final    NO GROWTH 3 DAYS Performed at [facility], [place] [address], [place], [place] [address]    Report Status 03/30/2021 FINAL  Final  MRSA Next Gen by PCR, Nasal     Status: None   Collection Time: 03/26/21 10:06 PM   Specimen:  Nasal Mucosa; Nasal Swab  Result Value Ref Range Status   MRSA by PCR Next Gen NOT DETECTED NOT DETECTED Final    Comment: (NOTE) The GeneXpert MRSA Assay (FDA approved for NASAL specimens only), is one component of a comprehensive MRSA colonization surveillance program. It is not intended to diagnose MRSA infection nor to guide or monitor treatment for MRSA infections. Test performance is not FDA approved in patients less than 79 years old. Performed at [facility], [address] [place], [place] [address]   Body fluid culture w Gram Stain     Status: None (Preliminary result)   Collection Time: 04/01/21 12:40 PM   Specimen: PATH Cytology Pleural fluid  Result Value Ref Range Status   Specimen Description   Final    PLEURAL Performed at [facility], [address], [place], [place] [address]    Special Requests   Final    NONE Performed at [facility], [address] [place], [place] [address]    Gram Stain   Final    ABUNDANT WBC PRESENT,BOTH PMN AND MONONUCLEAR NO ORGANISMS SEEN Performed at [facility], [place] [address] [place], [place] [address]    Culture PENDING  Incomplete   Report Status PENDING  Incomplete      Radiology Studies: US RENAL  Result Date: 04/02/2021 CLINICAL DATA:  35 year old female for follow-up of LEFT renal lesion/abscess. EXAM: RENAL / URINARY TRACT ULTRASOUND COMPLETE COMPARISON:  03/27/2021 ultrasound and prior studies FINDINGS: Right Kidney: Renal measurements: 11.8 x 4.6 x 5.7 cm = volume: 161  mL. Echogenicity within normal limits. No mass or hydronephrosis visualized. Left Kidney: Renal measurements: 11.3 x 6.4 x 5.2 cm = volume: 199 mL. The heterogeneous/complex area within the UPPER LEFT kidney now measures 2 x 2 x 1.9 cm, previously 3.2 x 2.5 x 2.9 cm. No other LEFT renal abnormalities are identified. Bladder: Appears normal for degree of bladder distention. Other: None. IMPRESSION: 1. Interval decrease  in heterogeneous/complex area within the UPPER LEFT kidney, now measuring 2 x 2 x 1.9 cm, previously 3.2 x 2.5 x 2.9 cm on 03/27/2021. This most likely represents an improving area of infection/abscess. 2. No other significant abnormalities. Electronically Signed   By: Margarette Canada M.D.   On: 04/02/2021 12:27   DG Chest Port 1 View  Result Date: 04/01/2021 CLINICAL DATA:  35 year old female with left pleural effusion status post thoracentesis. EXAM: PORTABLE CHEST - 1 VIEW COMPARISON:  03/30/2021 FINDINGS: The mediastinal contours are within normal limits. No cardiomegaly. Blunting of the left costophrenic angle, with decreased size of pleural effusion from comparison, now trace. No pneumothorax. Persistent left lower lobar consolidative opacities. No acute osseous abnormality. IMPRESSION: 1. No pneumothorax after left thoracentesis. Decreased size of left pleural effusion. 2. Persistent left lower lobe consolidative opacities. Ruthann Cancer, MD [facility] Radiology Electronically Signed   By: Ruthann Cancer MD   On: 04/01/2021 13:07   [place] EKG SITE RITE  Result Date: 04/01/2021 If Site Rite image not attached, placement could not be confirmed due to current cardiac rhythm.  US THORACENTESIS ASP PLEURAL SPACE W/IMG GUIDE  Result Date: 04/01/2021 INDICATION: 35 year old female with left pleural effusion. EXAM: ULTRASOUND GUIDED LEFT THORACENTESIS MEDICATIONS: None. COMPLICATIONS: None immediate. PROCEDURE: An ultrasound guided thoracentesis was thoroughly discussed with the patient and questions answered. The benefits, risks, alternatives and complications were also discussed. The patient understands and wishes to proceed with the procedure. Written consent was obtained. Ultrasound was performed to localize and mark an adequate pocket of fluid in the left chest. The area was then prepped and draped in the normal sterile fashion. 1% Lidocaine was used for local  anesthesia. Under ultrasound guidance a 19 gauge, 7-cm, Yueh catheter was introduced. Thoracentesis was performed. The catheter was removed and a dressing applied. FINDINGS: A total of approximately 450 mL of translucent, green tinged fluid was removed. IMPRESSION: Successful ultrasound guided left thoracentesis yielding 450 mL of pleural fluid. Ruthann Cancer, MD [facility] Radiology Electronically Signed   By: Ruthann Cancer MD   On: 04/01/2021 13:08    Scheduled Meds:  Chlorhexidine Gluconate Cloth  6 each Topical Daily   enoxaparin (LOVENOX) injection  40 mg Subcutaneous Q24H   feeding supplement  1 Container Oral TID BM   ferrous sulfate  300 mg Oral BID WC   prenatal multivitamin  1 tablet Oral Q1200   sodium chloride flush  10-40 mL Intracatheter Q12H   Continuous Infusions:  sodium chloride 10 mL/hr at 04/02/21 0921   cefTRIAXone (ROCEPHIN)  IV 2 g (04/02/21 6433)   methocarbamol (ROBAXIN) IV 500 mg (04/02/21 1547)     LOS: 8 days    Enzo Bi, MD [facility]  If 7PM-7AM, please contact night-coverage [email or web address]  04/02/2021, 6:28 PM

## 2021-04-02 NOTE — TOC Progression Note (Signed)
Transition of Care [facility]) - Progression Note    Patient Details  Name: Laura Sosa MRN: [id number] Date of Birth: Feb 15, 1986  Transition of Care [facility]) CM/SW Contact  Maree Krabbe, LCSW Phone Number: 04/02/2021, 11:30 AM  Clinical Narrative:   Referral given to [facility]. She will pull pt's referral and get started. She will follow up with CSW if she will do teaching today prior to dc or teaching tomorrow at pt's home.         Expected Discharge Plan and Services           Expected Discharge Date: 04/02/21                                     Social Determinants of Health (SDOH) Interventions    Readmission Risk Interventions No flowsheet data found.

## 2021-04-02 NOTE — Progress Notes (Signed)
Peripherally Inserted Central Catheter Placement  The IV Nurse has discussed with the patient and/or persons authorized to consent for the patient, the purpose of this procedure and the potential benefits and risks involved with this procedure.  The benefits include less needle sticks, lab draws from the catheter, and the patient may be discharged home with the catheter. Risks include, but not limited to, infection, bleeding, blood clot (thrombus formation), and puncture of an artery; nerve damage and irregular heartbeat and possibility to perform a PICC exchange if needed/ordered by physician.  Alternatives to this procedure were also discussed.  Bard Power PICC patient education guide, fact sheet on infection prevention and patient information card has been provided to patient /or left at bedside.    PICC Placement Documentation  PICC Single Lumen 04/02/21 Right Basilic 36 cm 0 cm (Active)  Indication for Insertion or Continuance of Line Home intravenous therapies (PICC only) 04/02/21 0909  Exposed Catheter (cm) 0 cm 04/02/21 0909  Site Assessment Clean;Dry;Intact 04/02/21 0909  Line Status Flushed;Saline locked;Blood return noted 04/02/21 0909  Dressing Type Transparent 04/02/21 0909  Dressing Status Clean;Dry;Intact 04/02/21 0909  Antimicrobial disc in place? Yes 04/02/21 0909  Safety Lock Not Applicable 04/02/21 0909  Line Care Tubing changed;Connections checked and tightened 04/02/21 0909  Line Adjustment (NICU/IV Team Only) No 04/02/21 0909  Dressing Intervention New dressing 04/02/21 0909  Dressing Change Due 04/09/21 04/02/21 0909       Elliot Dally 04/02/2021, 9:15 AM

## 2021-04-02 NOTE — TOC Progression Note (Signed)
Transition of Care [facility]) - Progression Note    Patient Details  Name: Laura Sosa MRN: [id number] Date of Birth: 09-21-1985  Transition of Care [facility]) CM/SW Contact  Maree Krabbe, LCSW Phone Number: 04/02/2021, 2:10 PM  Clinical Narrative:   Pam with [facility] is unable to service pt due to insurance. However, she directed me to [place] with [facility]. They are looking at patients referral. They are looking into insurance because they are unsure if they can get auth since delivery would be to pt in [place] when she has [place] Medicaid.         Expected Discharge Plan and Services           Expected Discharge Date: 04/02/21                                     Social Determinants of Health (SDOH) Interventions    Readmission Risk Interventions No flowsheet data found.

## 2021-04-02 NOTE — Progress Notes (Addendum)
Antepartum Progress Note  Subjective:     Patient ID: Laura Sosa is a 35 y.o. [id number] female [redacted]w[redacted]d, Estimated Date of Delivery: 07/05/21 consistent with first trimester sono who was admitted for possible left renal abscess, unresponsive to IM antibiotic therapy. Also s/p left ureteroscopy and lithotripsy  and ureteral stent placement with subsequent removal in previous admission (7/11-7/13).  HD# 8.   Hospital course complicated by large left pleural effusion, POD#7 s/p thoracentesis. Developed postthoracentesis left-sided pain and spasms along with some shortness of breath.  Also with fluid recollection on repeat CXR.  Now POD#1 s/p repeat thoracentesis (drained ~ 500 cc).   Subjective:  Patient complains of something leaking from her vagina. Notes that she woke up this morning and her underwear were slightly wet.  Does not think it was urine.  Has already ambulated to the restroom this morning and voided.   Notes she is feeling ok s/p second thoracentesis. Notes this experience was better, however was still noting significant pain after her procedure yesterday.   Review of Systems Denies contractions, vaginal bleeding, and reports good fetal movement.     Objective:   Vitals:   04/01/21 2333 04/02/21 0331 04/02/21 0931 04/02/21 1100  BP: 101/66 114/75 107/74 97/63  Pulse: 100 (!) 106 93 97  Resp: 18 18 18 18   Temp: 98 F (36.7 C) 98.1 F (36.7 C) 98 F (36.7 C) 97.9 F (36.6 C)  TempSrc: Oral Oral Oral Oral  SpO2: 97% 98% 96%   Weight:      Height:       I/O last 3 completed shifts: In: 217.4 [IV Piggyback:217.4] Out: 1600 [Urine:1600] Total I/O In: -  Out: 300 [Urine:300]   General appearance: alert and no distress Lungs:  mildly decreased breath sounds at left lower lung base, improved since yesterday.   Heart: regular rate and rhythm, S1, S2 normal, no murmur, click, rub or gallop Abdomen: soft, non-tender; bowel sounds normal; no masses,  no  organomegaly Pelvic: external genitalia normal, rectovaginal septum normal.  Vagina with small amount of thin white discharge, no odor. No fluid noted in vaginal vault, no gush on Valsalva. Cervix normal appearing, no lesions.  Extremities: extremities normal, atraumatic, no cyanosis or edema   FHT: 135 bpm (overnight)     Meds:  Scheduled Meds:  Chlorhexidine Gluconate Cloth  6 each Topical Daily   enoxaparin (LOVENOX) injection  40 mg Subcutaneous Q24H   feeding supplement  1 Container Oral TID BM   ferrous sulfate  300 mg Oral BID WC   prenatal multivitamin  1 tablet Oral Q1200   sodium chloride flush  10-40 mL Intracatheter Q12H   Continuous Infusions:  sodium chloride 10 mL/hr at 04/02/21 0921   cefTRIAXone (ROCEPHIN)  IV 2 g (04/02/21 0938)   methocarbamol (ROBAXIN) IV 500 mg (04/02/21 0600)   PRN Meds:.sodium chloride, calcium carbonate, HYDROmorphone HCl, hydrOXYzine, ondansetron (ZOFRAN) IV, promethazine, sodium chloride flush, zolpidem   Labs:  CBC Latest Ref Rng & Units 04/02/2021 04/01/2021 03/31/2021  WBC 4.0 - 10.5 K/uL 8.4 10.1 8.9  Hemoglobin 12.0 - 15.0 g/dL 10.0(L) 9.4(L) 9.8(L)  Hematocrit 36.0 - 46.0 % 31.1(L) 28.4(L) 29.4(L)  Platelets 150 - 400 K/uL 395 413(H) 421(H)    CMP Latest Ref Rng & Units 04/02/2021 04/01/2021 03/31/2021  Glucose 70 - 99 mg/dL 96 05/31/2021) 353(G)  BUN 6 - 20 mg/dL 9 9 9   Creatinine 0.44 - 1.00 mg/dL 992(E 2.68  Sodium 135 - 145 mmol/L 135 135  137  Potassium 3.5 - 5.1 mmol/L 4.0 4.0 3.2(L)  Chloride 98 - 111 mmol/L 101 105 107  CO2 22 - 32 mmol/L 27 24 23   Calcium 8.9 - 10.3 mg/dL ) 8.3(L) 8.1(L)  Total Protein 6.5 - 8.1 g/dL - - 5.9(L)  Total Bilirubin 0.3 - 1.2 mg/dL - - 0.6  Alkaline Phos 38 - 126 U/L - - 108  AST 15 - 41 U/L - - 52(H)  ALT 0 - 44 U/L - - 83(H)     Imaging:  4.1(D RENAL CLINICAL DATA:  35 year old female for follow-up of LEFT renal lesion/abscess.  EXAM: RENAL / URINARY TRACT ULTRASOUND  COMPLETE  COMPARISON:  03/27/2021 ultrasound and prior studies  FINDINGS: Right Kidney:  Renal measurements: 11.8 x 4.6 x 5.7 cm = volume: 161 mL. Echogenicity within normal limits. No mass or hydronephrosis visualized.  Left Kidney:  Renal measurements: 11.3 x 6.4 x 5.2 cm = volume: 199 mL. The heterogeneous/complex area within the UPPER LEFT kidney now measures 2 x 2 x 1.9 cm, previously 3.2 x 2.5 x 2.9 cm.  No other LEFT renal abnormalities are identified.  Bladder:  Appears normal for degree of bladder distention.  Other:  None.  IMPRESSION: 1. Interval decrease in heterogeneous/complex area within the UPPER LEFT kidney, now measuring 2 x 2 x 1.9 cm, previously 3.2 x 2.5 x 2.9 cm on 03/27/2021. This most likely represents an improving area of infection/abscess. 2. No other significant abnormalities.  Electronically Signed   By: 05/27/2021 M.D.   On: 04/02/2021 12:27   DG CHEST PORT 1 VIEW CLINICAL DATA:  35 year old female with left pleural effusion status post thoracentesis.   EXAM: PORTABLE CHEST - 1 VIEW   COMPARISON:  03/30/2021   FINDINGS: The mediastinal contours are within normal limits. No cardiomegaly. Blunting of the left costophrenic angle, with decreased size of pleural effusion from comparison, now trace. No pneumothorax. Persistent left lower lobar consolidative opacities. No acute osseous abnormality.   IMPRESSION: 1. No pneumothorax after left thoracentesis. Decreased size of left pleural effusion. 2. Persistent left lower lobe consolidative opacities.   05/30/2021, MD   [facility]   [facility]     Electronically Signed   By: [facility] MD   On: 04/01/2021 13:07   Assessment:  35 y.o. female [redacted]w[redacted]d, with:    1. Renal abscess   2. Pregnancy   3. Recurrent left pleural effusion   4. Status post thoracentesis   5. SOB (shortness of breath)   6. Hypokalemia  7.   Hypomagnesemia        Plan:   Renal abscess (left) To continue management per ID/Urology recommendations. Patient with continued decrease in size of abscess on most recent ultrasound this morning.  Receiving daily injections of IV Ceftriaxone.  Plan is to continue IV antibiotics for a total of 4 weeks outpatient. WBC count remains stable. Remains afebrile. To have outpatient education on antibiotic injections.   Pregnancy at [redacted] weeks gestation Continue to monitor fetal heart tones q shift. Fetal status overall reassuring.  Continue Lovenox for DVT prophylaxis Anemia of pregnancy normal, can treat with oral iron.  Patient complained of possible leaking fluids. Speculum exam notes normal thin white vaginal discharge, no odor. ROM Plus also performed to r/o PPROM. Likely leukorrhea of pregnancy.  Pleural effusion  S/p thoracentesis earlier this admission with recollection of fluid, exudate. Cultures negative. Repeat thoracentesis performed yesterday, drained approximately half of total volume, ~ 500  cc). Patient remains on room air with normal sats.To discuss with hospitalist regarding plan to prevent future accumulations until abscess has resolved. Can consider intermittent use of Lasix (I.e. q 2-3 days) or other diuretic.  Pain management  Continue liquid form of medications as much as possible. Will be discharged home with short supply of pain medication for management after recent thoracentesis and intermittent flank pain.  Left-sided weakness  Weakness resolved. . Still notes occasional left-sided spasms, can continue management with muscle relaxers.  Electrolyte disturbances (hypokalemia, hypomagnesemia)  Continue IV repletion.  Plans are for discharge today per primary team.  Patient has f/u in office with Pulmonology, ID, and OB within the next 1-2 weeks. .    A total of 15 minutes were spent face-to-face with the patient during this encounter with counseling and face-to-face  interaction.    Hildred Laser, MD Obstetrics and Gynecology [facility] [place]

## 2021-04-03 DIAGNOSIS — O26832 Pregnancy related renal disease, second trimester: Secondary | ICD-10-CM | POA: Diagnosis not present

## 2021-04-03 DIAGNOSIS — R0602 Shortness of breath: Secondary | ICD-10-CM | POA: Diagnosis not present

## 2021-04-03 DIAGNOSIS — Z3A25 25 weeks gestation of pregnancy: Secondary | ICD-10-CM | POA: Diagnosis not present

## 2021-04-03 DIAGNOSIS — N151 Renal and perinephric abscess: Secondary | ICD-10-CM | POA: Diagnosis not present

## 2021-04-03 DIAGNOSIS — N12 Tubulo-interstitial nephritis, not specified as acute or chronic: Secondary | ICD-10-CM | POA: Diagnosis not present

## 2021-04-03 LAB — BASIC METABOLIC PANEL
Anion gap: 6 (ref 5–15)
BUN: 9 mg/dL (ref 6–20)
CO2: 24 mmol/L (ref 22–32)
Calcium: 8.1 mg/dL — ABNORMAL LOW (ref 8.9–10.3)
Chloride: 103 mmol/L (ref 98–111)
Creatinine, Ser: 0.57 mg/dL (ref 0.44–1.00)
GFR, Estimated: >60 mL/min (ref >60)
Glucose, Bld: 96 mg/dL (ref 70–99)
Potassium: 4.1 mmol/L (ref 3.5–5.1)
Sodium: 133 mmol/L — ABNORMAL LOW (ref 135–145)

## 2021-04-03 LAB — CBC
HCT: 29.1 % — ABNORMAL LOW (ref 36.0–46.0)
Hemoglobin: 9.7 g/dL — ABNORMAL LOW (ref 12.0–15.0)
MCH: 29.5 pg (ref 26.0–34.0)
MCHC: 33.3 g/dL (ref 30.0–36.0)
MCV: 88.4 fL (ref 80.0–100.0)
Platelets: 399 10*3/uL (ref 150–400)
RBC: 3.29 MIL/uL — ABNORMAL LOW (ref 3.87–5.11)
RDW: 13.6 % (ref 11.5–15.5)
WBC: 9.8 10*3/uL (ref 4.0–10.5)
nRBC: 0 % (ref 0.0–0.2)

## 2021-04-03 LAB — PATHOLOGIST SMEAR REVIEW

## 2021-04-03 LAB — MAGNESIUM: Magnesium: 1.8 mg/dL (ref 1.7–2.4)

## 2021-04-03 MED ORDER — HEPARIN SOD (PORK) LOCK FLUSH 100 UNIT/ML IV SOLN
250.0000 [IU] | INTRAVENOUS | Status: AC | PRN
  Administered 2021-04-03: 250 [IU]
  Filled 2021-04-03: qty 5

## 2021-04-03 NOTE — Progress Notes (Signed)
   04/03/21 1007  Clinical Encounter Type  Visited With Patient  Visit Type Initial  Referral From Chaplain  Consult/Referral To Chaplain  Spiritual Encounters  Spiritual Needs Emotional  Stress Factors  Patient Stress Factors Health changes  Chaplain Decklyn Hyder visited room 347A Pt, Laura Sosa. Pt was a little tired and resting so I did not spend significant time in the room. I provided a ministry of presence and support and I advised the Pt that I will follow up at another time.

## 2021-04-03 NOTE — Progress Notes (Signed)
Urology Inpatient Progress Note  Subjective: Repeat RUS yesterday with continued decrease in left renal abscess size. WBC count stable today, 9.8. Creatinine stable, 0.57. PICC line in place with plans for discharge today on outpatient IV antibiotics until 04/21/2021. Patient reports she plans to go to [place] upon discharge to be near her boyfriend and his mother so they may assist in her care. She plans to return to [place] once she completes her abx. She reports left flank spasms today.  Anti-infectives: Anti-infectives (From admission, onward)    Start     Dose/Rate Route Frequency Ordered Stop   04/02/21 0000  cefTRIAXone (ROCEPHIN) IVPB        2 g Intravenous Every 24 hours 04/02/21 1007 04/21/21 2359   03/29/21 0930  fluconazole (DIFLUCAN) IVPB 200 mg        200 mg 100 mL/hr over 60 Minutes Intravenous  Once 03/29/21 0834 03/29/21 1042   03/25/21 2200  Vancomycin (VANCOCIN) 1,250 mg in sodium chloride 0.9 % 250 mL IVPB  Status:  Discontinued       Note to Pharmacy: Please adjust dose accordingly. [redacted] weeks pregnant. Labs are ordered. Thank you   1,250 mg 166.7 mL/hr over 90 Minutes Intravenous Every 12 hours 03/25/21 0828 03/25/21 1139   03/25/21 1000  Vancomycin (VANCOCIN) 1,500 mg in sodium chloride 0.9 % 500 mL IVPB       Note to Pharmacy: Please adjust dose accordingly. [redacted] weeks pregnant. Labs are ordered. Thank you   1,500 mg 250 mL/hr over 120 Minutes Intravenous  Once 03/25/21 0825 03/25/21 1118   03/25/21 0900  cefTRIAXone (ROCEPHIN) 2 g in sodium chloride 0.9 % 100 mL IVPB        2 g 200 mL/hr over 30 Minutes Intravenous Every 24 hours 03/25/21 0805     03/25/21 0900  vancomycin (VANCOCIN) IVPB 1000 mg/200 mL premix  Status:  Discontinued       Note to Pharmacy: Please adjust dose accordingly. [redacted] weeks pregnant. Labs are ordered. Thank you   1,000 mg 200 mL/hr over 60 Minutes Intravenous Every 12 hours 03/25/21 0805 03/25/21 0825       Current Facility-Administered  Medications  Medication Dose Route Frequency Provider Last Rate Last Admin   0.9 %  sodium chloride infusion   Intravenous PRN Linzie Collin, MD 10 mL/hr at 04/02/21 2000 Restarted at 04/02/21 2000   calcium carbonate (TUMS - dosed in mg elemental calcium) chewable tablet 400 mg of elemental calcium  2 tablet Oral Q4H PRN Arnetha Courser, MD       cefTRIAXone (ROCEPHIN) 2 g in sodium chloride 0.9 % 100 mL IVPB  2 g Intravenous Q24H Arnetha Courser, MD 200 mL/hr at 04/03/21 0846 2 g at 04/03/21 0846   Chlorhexidine Gluconate Cloth 2 % PADS 6 each  6 each Topical Daily Ray Church III, MD       enoxaparin (LOVENOX) injection 40 mg  40 mg Subcutaneous Q24H Arnetha Courser, MD   40 mg at 03/28/21 2101   feeding supplement (BOOST / RESOURCE BREEZE) liquid 1 Container  1 Container Oral TID BM Arnetha Courser, MD   1 Container at 03/29/21 2014   ferrous sulfate (FER-IN-SOL) 75 (15 Fe) MG/ML solution 300 mg  300 mg Oral BID WC Hildred Laser, MD   300 mg at 04/03/21 0757   HYDROmorphone HCl (DILAUDID) liquid 1.5 mg  1.5 mg Oral Q6H PRN Darlin Priestly, MD   1.5 mg at 04/03/21 0757   hydrOXYzine (ATARAX/VISTARIL)  tablet 25 mg  25 mg Oral TID PRN Arnetha Courser, MD   25 mg at 04/02/21 1835   methocarbamol (ROBAXIN) 500 mg in dextrose 5 % 50 mL IVPB  500 mg Intravenous Pearletha Alfred, MD   Stopped at 04/02/21 2331   ondansetron (ZOFRAN) injection 4 mg  4 mg Intravenous Q6H PRN Arnetha Courser, MD   4 mg at 04/02/21 1835   prenatal multivitamin tablet 1 tablet  1 tablet Oral Q1200 Arnetha Courser, MD       promethazine (PHENERGAN) suppository 12.5 mg  12.5 mg Rectal Q8H PRN Arnetha Courser, MD   12.5 mg at 03/25/21 1618   sodium chloride flush (NS) 0.9 % injection 10-40 mL  10-40 mL Intracatheter Q12H Ray Church III, MD   10 mL at 04/02/21 0940   sodium chloride flush (NS) 0.9 % injection 10-40 mL  10-40 mL Intracatheter PRN Ray Church III, MD   10 mL at 04/02/21 0924   zolpidem (AMBIEN) tablet 5 mg  5 mg Oral QHS PRN  Arnetha Courser, MD   5 mg at 04/03/21 2751   Objective: Vital signs in last 24 hours: Temp:  [97.8 F (36.6 C)-98.5 F (36.9 C)] 98.5 F (36.9 C) (08/07 2303) Pulse Rate:  [83-109] 83 (08/08 0805) Resp:  [16-20] 16 (08/08 0805) BP: (97-116)/(62-81) 99/62 (08/08 0805) SpO2:  [96 %-98 %] 96 % (08/08 0805)  Intake/Output from previous day: 08/07 0701 - 08/08 0700 In: 360 [I.V.:110; IV Piggyback:250] Out: 600 [Urine:600] Intake/Output this shift: Total I/O In: -  Out: 400 [Urine:400]  Physical Exam Vitals and nursing note reviewed.  Constitutional:      General: She is not in acute distress.    Appearance: She is not ill-appearing, toxic-appearing or diaphoretic.  HENT:     Head: Normocephalic and atraumatic.  Pulmonary:     Effort: Pulmonary effort is normal. No respiratory distress.  Skin:    General: Skin is warm and dry.  Neurological:     Mental Status: She is alert and oriented to person, place, and time.  Psychiatric:        Mood and Affect: Mood normal.        Behavior: Behavior normal.    Lab Results:  Recent Labs    04/02/21 0630 04/03/21 0433  WBC 8.4 9.8  HGB 10.0* 9.7*  HCT 31.1* 29.1*  PLT 395 399   BMET Recent Labs    04/02/21 0630 04/03/21 0433  NA 135 133*  K 4.0 4.1  CL 101 103  CO2 27 24  GLUCOSE 96 96  BUN 9 9  CREATININE 0.66 0.57  CALCIUM 8.4* 8.1*   Studies/Results: US RENAL  Result Date: 04/02/2021 CLINICAL DATA:  35 year old female for follow-up of LEFT renal lesion/abscess. EXAM: RENAL / URINARY TRACT ULTRASOUND COMPLETE COMPARISON:  03/27/2021 ultrasound and prior studies FINDINGS: Right Kidney: Renal measurements: 11.8 x 4.6 x 5.7 cm = volume: 161 mL. Echogenicity within normal limits. No mass or hydronephrosis visualized. Left Kidney: Renal measurements: 11.3 x 6.4 x 5.2 cm = volume: 199 mL. The heterogeneous/complex area within the UPPER LEFT kidney now measures 2 x 2 x 1.9 cm, previously 3.2 x 2.5 x 2.9 cm. No other LEFT  renal abnormalities are identified. Bladder: Appears normal for degree of bladder distention. Other: None. IMPRESSION: 1. Interval decrease in heterogeneous/complex area within the UPPER LEFT kidney, now measuring 2 x 2 x 1.9 cm, previously 3.2 x 2.5 x 2.9 cm on 03/27/2021. This most  likely represents an improving area of infection/abscess. 2. No other significant abnormalities. Electronically Signed   By: Harmon Pier M.D.   On: 04/02/2021 12:27   Assessment & Plan: 35 year old pregnant female at [redacted]w[redacted]d admitted with multiple left renal abscesses as well as possible psoas abscess who developed pleural effusion during admission now s/p thoracentesis x2.  Agree with plans for discharge today with plans for IV Rocephin through PICC per ID.  Given her plans to leave the state for the duration of her outpatient therapy, will adjust her follow-up and plan to see her 1-2 weeks after completing abx with repeat US to reassess abscess. Patient is in agreement with this plan.  Carman Ching, PA-C 04/03/2021

## 2021-04-03 NOTE — Discharge Summary (Signed)
Physician Discharge Summary   Laura Sosa  female DOB: [birth year]  PVX:[id number]  PCP: Laura Sosa  Admit date: 03/24/2021 Discharge date: 04/03/2021  Admitted From: home Disposition:  home Home Health: Yes HHRN  CODE STATUS: Full code  Discharge Instructions     [facility] pharmacist to adjust dose for Vancomycin, Aminoglycosides and other anti-infective therapies as requested by physician.   Complete by: As directed    [facility] to provide Cath Flo 91m   Complete by: As directed    Administer for PICC line occlusion and as ordered by physician for other access device issues.   Anaphylaxis Kit: Provided to treat any anaphylactic reaction to the medication being provided to the patient if First Dose or when requested by physician   Complete by: As directed    Epinephrine 159mml vial / amp: Administer 0.49m249m0.49ml35mubcutaneously once for moderate to severe anaphylaxis, nurse to call physician and pharmacy when reaction occurs and call 911 if needed for immediate care   Diphenhydramine 50mg68mIV vial: Administer 25-50mg 69mM PRN for first dose reaction, rash, itching, mild reaction, nurse to call physician and pharmacy when reaction occurs   Sodium Chloride 0.9% NS 500ml I69mdminister if needed for hypovolemic blood pressure drop or as ordered by physician after call to physician with anaphylactic reaction   Change dressing on IV access line weekly and PRN   Complete by: As directed    Discharge instructions   Complete by: As directed    You will finish IV antibiotic ceftriaxone at home until 04/21/21.  Please follow up with Dr. AleskerLanney Ginsur left-sided pleural effusion.  Please follow up with Infections Disease Dr. RavishaDelaine Lame8/22 at noon.  Call [phone] questions.   Dr. Dennie Vecchio LaEnzo Sosa IV access with Sodium Chloride 0.9% and Heparin 10 units/ml or 100 units/ml   Complete by: As directed    Home  infusion instructions - [facility]   Complete by: As directed    Instructions: Flush IV access with Sodium Chloride 0.9% and Heparin 10units/ml or 100units/ml   Change dressing on IV access line: Weekly and PRN   Instructions Cath Flo 2mg: Ad85mister for PICC Line occlusion and as ordered by physician for other access device   [facility] pharmacist to adjust dose for: Vancomycin, Aminoglycosides and other anti-infective therapies as requested by physician   Method of administration may be changed at the discretion of home infusion pharmacist based upon assessment of the patient and/or caregiver's ability to self-administer the medication ordered   Complete by: As directed    No wound care   Complete by: As directed         Hospital Course:  For full details, please see H&P, progress notes, consult notes and ancillary notes.  Briefly,  Laura RMartiniquerson is a 35 y.o. 31male with medical history significant for bilateral kidney stones, migraine headaches, pyelonephritis, G3P2002, [redacted] weeks pregnant, who was admitted to the hospital under obstetrics and gynecologic service for left flank pain, pyuria, hematuria, pain management and IV antibiotics.   She was recently treated for urinary tract infection and left renal abscess failing outpatient therapy with 9 doses of Rocephin outpatient.  She is status post left ureteral stent exchange on 03/06/2021 and subsequent stent removal on 03/08/2021.     Hospitalist service was consulted for shortness of breath, worse with exertion.  Later hospitalist was asked to take over pt as  primary team.   Found to have large left pleural effusion.  thoracentesis with removal of 500 cc of exudative fluid, cultures neg.  Immediate after thoracentesis, pt developed severe left-sided pain and spasms along with some shortness of breath and was transferred to stepdown.  Pt required a lot of opioids pain meds to control her pain.  Was quickly weaned  down to RA and transferred back to labor-delivery unit.  Exertional dyspnea.   Multifactorial with current pregnancy, dilutional anemia and left pleural effusion.  Troponin, BNP within normal limit.  Procalcitonin at 0.16, CRP of 5.7 and ESR of 85 with history of recent left renal abscess and urologic procedures.  D-dimer was mildly elevated but CTA was negative for PE. --transferred to stepdown for worsening dyspnea.  Improved and weaned off from oxygen.     Pleural effusion on the left Thoracentesis on 7/31 removed 575 cc with appeared exudative, neg growth. --CXR on 8/4 showed worsening left pleural effusion and consolidation. --repeat thoracentesis 8/6, 450 mL translucent, green-tinged fluid removed, appeared transudate this time. --per pulm Dr. Lanney Sosa, can discharge with outpatient followup with him in 1 week.   severe left-sided chest pain and spasms  Left shoulder pain --developed after first thoracentesis, maybe hit a nerve. --Pt was requiring a lot of opioids, and was finally pain controlled on oral liquid dilaudid 2 mg q4h.  Also tried gabapentin, muscle relaxant.   --attempt was made to taper the dilaudid prior to discharge, and was discharged on 1.5 mg q6h PRN.   Hypomagnesemia hypokalemia.   --replete PRN   History of recent left renal abscess Pyelonephritis nephrolithiasis.   Patient had recent lithotripsy done, also has a stent placement followed by removal due to worsening pain.  MRI abdomen showed "Multiple left renal lesions measuring up to 4 cm are similar to previous ultrasound, and associated perinephric soft tissue stranding is demonstrated...they are suspicious for pyelonephritis with multiple renal abscesses." --ID and urology consulted. --no drainable collection, per IR --Per ID, cont ceftriaxone, until 04/21/21 --Pt was discharged home with PICC line to continue ceftriaxone.   --Pt will follow up with ID Dr. Delaine Sosa on 04/13/21.   Fetal monitoring. -Per  OB/GYN.   Diastolic dysfunction of unclear significance --grade II diastolic dysfunction from Echo on 03/27/21.  Possibly due to pregnancy.     Discharge Diagnoses:  Principal Problem:   Shortness of breath Active Problems:   History of pyelonephritis   Hyperemesis gravidarum   Hyperemesis affecting pregnancy, antepartum   Pregnancy   Anemia of pregnancy in second trimester   Renal abscess   Pleural effusion   Status post thoracentesis   30 Day Unplanned Readmission Risk Score    Flowsheet Row Admission (Current) from 03/24/2021 in [facility]  30 Day Unplanned Readmission Risk Score (%) 25.57 Filed at 04/03/2021 0801       This score is the patient's risk of an unplanned readmission within 30 days of being discharged (0 -100%). The score is based on dignosis, age, lab data, medications, orders, and past utilization.   Low:  0-14.9   Medium: 15-21.9   High: 22-29.9   Extreme: 30 and above         Discharge Instructions:  Allergies as of 04/03/2021   No Known Allergies      Medication List     STOP taking these medications    HYDROmorphone 4 MG tablet Commonly known as: DILAUDID Replaced by: HYDROmorphone HCl 1 MG/ML Liqd   promethazine  12.5 MG suppository Commonly known as: PHENERGAN       TAKE these medications    acetaminophen 325 MG tablet Commonly known as: TYLENOL Take 2 tablets (650 mg total) by mouth every 4 (four) hours as needed (for pain scale < 4  OR  temperature  >/=  100.5 F).   cefTRIAXone  IVPB Commonly known as: ROCEPHIN Inject 2 g into the vein daily for 19 days. Method of administration: IV Push Method of administration may be changed at the discretion of home infusion pharmacist based upon assessment of the patient and/or caregiver's ability to self-administer the medication ordered.Indication:  Left Pyelonephritis, renal abscess versus phlegmon First Dose: No Last Day of Therapy:  04/21/21 [facility]  Per Protocol:including placement of biopatch   Labs weekly while on IV antibiotics: _X_ CBC with differential _X_ CMP _X_ Please pull PIC at completion of IV antibiotics _ Fax weekly labs to  [place] [phone]   Clinic Follow Up Appt: 04/13/21 at noon   Call [phone] with any questions   cyclobenzaprine 10 MG tablet Commonly known as: FLEXERIL Take 1 tablet (10 mg total) by mouth 3 (three) times daily as needed for muscle spasms.   docusate sodium 100 MG capsule Commonly known as: COLACE Take 1 capsule (100 mg total) by mouth 2 (two) times daily as needed for mild constipation.   ferrous sulfate 325 (65 FE) MG tablet Take 1 tablet (325 mg total) by mouth 2 (two) times daily with a meal.   HYDROmorphone HCl 1 MG/ML Liqd Commonly known as: DILAUDID Take 1.5 mLs (1.5 mg total) by mouth every 6 (six) hours as needed for up to 5 days for severe pain or moderate pain. Replaces: HYDROmorphone 4 MG tablet   hydrOXYzine 25 MG tablet Commonly known as: ATARAX/VISTARIL Take 1 tablet (25 mg total) by mouth 3 (three) times daily as needed for anxiety.   multivitamin-prenatal 27-0.8 MG Tabs tablet Take 1 tablet by mouth daily at 12 noon.   ondansetron 4 MG disintegrating tablet Commonly known as: ZOFRAN-ODT Take 1 tablet (4 mg total) by mouth every 8 (eight) hours as needed for nausea or vomiting.               Discharge Care Instructions  (From admission, onward)           Start     Ordered   04/02/21 0000  Change dressing on IV access line weekly and PRN  (Home infusion instructions - [facility] )        04/02/21 1007             Follow-up Information     Ottie Glazier, MD Follow up in 1 week(s).   Specialty: Pulmonary Disease Why: left-sided pleural effusion. Contact information: [address] [address] [place], [place] Follow up in 1 week(s).   Contact information: [place] [address] [phone]         Tsosie Billing, MD Follow up on 04/13/2021.   Specialty: Infectious Diseases Why: appointment time at noon. Contact information: [place] [address] [phone]                 No Known Allergies   The results of significant diagnostics from this hospitalization (including imaging, microbiology, ancillary and laboratory) are listed below for reference.   Consultations:   Procedures/Studies: CT Angio Chest Pulmonary Embolism (PE) W  or WO Contrast  Result Date: 03/25/2021 CLINICAL DATA:  Chest pain or SOB, pleurisy or effusion suspected PE suspected, low/intermediate prob, positive D-dimer Pregnant patient at [redacted] weeks gestation EXAM: CT ANGIOGRAPHY CHEST WITH CONTRAST TECHNIQUE: Multidetector CT imaging of the chest was performed using the standard protocol during bolus administration of intravenous contrast. Multiplanar CT image reconstructions and MIPs were obtained to evaluate the vascular anatomy. CONTRAST:  55m OMNIPAQUE IOHEXOL 350 MG/ML SOLN COMPARISON:  CT angio 03/13/2021 FINDINGS: Cardiovascular: Technically limited exam due to contrast bolus timing and patient motion artifact. Majority of the contrast is within the venous structures in the aorta. Exam is diagnostic to the lobar level for pulmonary embolus. No intraluminal filling defects are seen. Mild cardiomegaly is again seen. Trace pericardial effusion. Mediastinum/Nodes: Scattered small mediastinal lymph nodes not enlarged by size criteria. No convincing hilar adenopathy, left hilar evaluation is limited by adjacent airspace disease. Esophagus is mildly tortuous, minimal intraluminal fluid in the distal esophagus. Lungs/Pleura: Left pleural effusion is significantly increased from prior exam, now moderate to large. There is fluid tracking into the fissure. No convincing pleural enhancement or nodularity. There is adjacent compressive  atelectasis in the left upper lobe. Near complete compressive atelectasis and or airspace disease in the left lower lobe. Other than minor dependent atelectasis, the right lung is clear. No findings of pulmonary edema. No right pleural effusion. Trace right pleural thickening/effusion. Upper Abdomen: No acute findings. Musculoskeletal: There are no acute or suspicious osseous abnormalities. Review of the MIP images confirms the above findings. IMPRESSION: 1. No central pulmonary embolus. Technically limited exam due to contrast bolus timing and patient motion artifact. 2. Moderate to large left pleural effusion, significantly increased from prior exam, now moderate to large in tracking into the fissure. No convincing pleural enhancement or nodularity. Adjacent compressive atelectasis. There is also subtotal atelectasis and/or airspace disease of the left lower lobe. 3. Mild cardiomegaly. Trace pericardial effusion. Electronically Signed   By: MKeith RakeM.D.   On: 03/25/2021 18:41   CT Angio Chest PE W and/or Wo Contrast  Result Date: 03/13/2021 CLINICAL DATA:  Left hip pain pregnant patient EXAM: CT ANGIOGRAPHY CHEST WITH CONTRAST TECHNIQUE: Multidetector CT imaging of the chest was performed using the standard protocol during bolus administration of intravenous contrast. Multiplanar CT image reconstructions and MIPs were obtained to evaluate the vascular anatomy. CONTRAST:  772mOMNIPAQUE IOHEXOL 350 MG/ML SOLN COMPARISON:  None. FINDINGS: Cardiovascular: Satisfactory opacification of the pulmonary arteries to the segmental level. No evidence of pulmonary embolism. Nonaneurysmal aorta. Mild cardiomegaly. Small pericardial effusion. Mediastinum/Nodes: Midline trachea. No thyroid mass. No suspicious adenopathy. Esophagus within normal limits Lungs/Pleura: Small left-sided pleural effusion. Partial consolidation in the left lower lobe. Upper Abdomen: No acute abnormality. Musculoskeletal: No chest wall  abnormality. No acute or significant osseous findings. Review of the MIP images confirms the above findings. IMPRESSION: 1. Negative for acute pulmonary embolus. 2. Mild cardiomegaly. Small left-sided pleural effusion. Partial consolidation in the left lower lobe which may reflect atelectasis or pneumonia Electronically Signed   By: KiDonavan Foil.D.   On: 03/13/2021 23:12   MR ABDOMEN WO CONTRAST  Result Date: 03/24/2021 CLINICAL DATA:  Recent lithotripsy in stent placement for left renal calculus. Urinary tract infection and left renal hematoma versus abscess. Hyperemesis. Second trimester pregnancy. EXAM: MRI ABDOMEN WITHOUT CONTRAST TECHNIQUE: Multiplanar multisequence MR imaging was performed without the administration of intravenous contrast. No intravenous contrast administered due to the patient's pregnancy. COMPARISON:  Renal ultrasound on 03/13/2021 FINDINGS:  Lower chest: Left pleural effusion is noted. Hepatobiliary: A 7 mm markedly T2 hyperintense lesion is seen in the medial segment of the left hepatic lobe, which cannot be definitively characterized without IV contrast, but most likely represents a benign hemangioma or cyst. No other liver lesions identified. Gallbladder is unremarkable. No evidence of biliary ductal dilatation. Pancreas: No mass or inflammatory process visualized on this unenhanced exam. Spleen:  Within normal limits in size. Adrenals/Urinary tract: Both adrenal glands and right kidney are normal in appearance. Multiple lesions are seen involving the left kidney, largest in the upper pole measuring approximately 4 cm. These lesions show heterogeneous T2 signal intensity, with characterization limited by lack of IV contrast. Some lesions are more well-circumscribed than others. These lesions are similar in size to those shown on recent ultrasound. Left perinephric soft tissue stranding is seen, with similar lesion involving the left psoas muscle which shows central T2  hyperintensity and a T2 hypointense rim. This measures 2.2 x 1.2 cm on image 24/5. There is no evidence of hydronephrosis. Stomach/Bowel: Visualized portion unremarkable. Vascular/Lymphatic: No pathologically enlarged lymph nodes identified. No evidence of abdominal aortic aneurysm. Other:  None. Musculoskeletal:  No suspicious bone lesions identified. IMPRESSION: Multiple left renal lesions measuring up to 4 cm are similar to previous ultrasound, and associated perinephric soft tissue stranding is demonstrated. Although these cannot be definitively characterized on this noncontrast exam, they are suspicious for pyelonephritis with multiple renal abscesses. Similar but smaller 2 cm lesion involving the left psoas muscle, also suspicious for small abscess. No evidence of hydronephrosis. Left pleural effusion. Electronically Signed   By: Marlaine Hind M.D.   On: 03/24/2021 22:12   US Abdomen Complete  Result Date: 03/27/2021 CLINICAL DATA:  Sepsis. EXAM: ABDOMEN ULTRASOUND COMPLETE COMPARISON:  CT angiography chest 03/25/2021, ultrasound renal 03/25/2021, MRI abdomen 03/24/2021 FINDINGS: Gallbladder: No gallstones or wall thickening visualized. No sonographic Murphy sign noted by sonographer. Common bile duct: Diameter: 3 mm. Liver: No focal lesion identified. Within normal limits in parenchymal echogenicity. Portal vein is patent on color Doppler imaging with normal direction of blood flow towards the liver. IVC: No abnormality visualized. Pancreas: Visualized portion unremarkable. Spleen: Size and appearance within normal limits. Right Kidney: Length: 11.7 cm. Echogenicity within normal limits. No mass or hydronephrosis visualized. Left Kidney: Length: 11.6 cm. Echogenicity within normal limits. Redemonstration of a 3.2 x 2.5 x 2.9 cm (from 3.7 x 3.6 x 2.2 cm) isoechoic slightly heterogeneous lesion within the superior pole the left kidney. No posterior acoustic enhancement to suggest cystic lesion. No  hydronephrosis visualized. Abdominal aorta: No aneurysm visualized. Other findings: None. IMPRESSION: Slightly decreased in size 3.2 x 2.5 x 2.9 cm superior left renal pole lesion that likely represents an abscess. Please note incomplete characterization on MRI abdomen 03/24/2021 due to noncontrast study. Electronically Signed   By: Iven Finn M.D.   On: 03/27/2021 19:13   US OB Follow Up  Result Date: 03/28/2021 CLINICAL DATA:  Follow-up fetal anatomy EXAM: OBSTETRIC 14+ WK ULTRASOUND FOLLOW-UP FINDINGS: Number of Fetuses: 1 Heart Rate:  166 bpm Movement: Yes Presentation: Cephalic Previa: No Placental Location: Posterior Amniotic Fluid (Subjective): Normal Amniotic Fluid (Objective): Vertical pocket 5.3cm AFI: 15.4 cm. FETAL BIOMETRY BPD:  6.8cm 27w 3d HC:    25.5cm 27w 5d AC:    20.1cm 24w 5d FL:    4.8cm 25w 6d Current Mean GA: 26w 0d [place] EDC: 07/04/2021 Assigned GA: 25w 6d Assigned EDC: 07/05/2021 Estimated Fetal Weight:  824g 27%ile FETAL ANATOMY  Lateral Ventricles: Previously seen Thalami/CSP: Previously seen Posterior Fossa: Previously seen Nuchal Region: Previously seen Upper Lip: Previously seen Spine: Appears normal 4 Chamber Heart on Left: Previously seen LVOT: Previously seen RVOT: Appears normal Stomach on Left: Previously seen 3 Vessel Cord: Previously seen Cord Insertion site: Previously seen Kidneys: Previously seen. Right renal pelvis measures 5.8 mm in diameter. Mild calyceal dilation. Left renal pelvis measures 2.9 mm in diameter. Bladder: Previously seen Extremities: Previously seen Sex: Previously Seen Technical Limitations: Fetal positioning Maternal Findings: Cervix:  Appears closed. IMPRESSION: 1. Single live intrauterine gestation in cephalic presentation. 2. Assigned gestational age of [redacted] weeks 6 days. Adequate interval growth. 3. Adequate visualization of the fetal spine and RVOT. 4. Mild-moderate right fetal hydronephrosis with right renal pelvis diameter of 5.8 mm and mild  caliceal dilation. Unremarkable appearance of the left kidney and bladder. 5. Amniotic fluid index of 15.4 cm, within normal limits. Electronically Signed   By: Davina Poke D.O.   On: 03/28/2021 13:14   [place] Intraoperative  Result Date: 03/06/2021 CLINICAL DATA:  Ultrasound was provided for use by the ordering physician.  No provider Interpretation or professional fees incurred.    US RENAL  Result Date: 04/02/2021 CLINICAL DATA:  35 year old female for follow-up of LEFT renal lesion/abscess. EXAM: RENAL / URINARY TRACT ULTRASOUND COMPLETE COMPARISON:  03/27/2021 ultrasound and prior studies FINDINGS: Right Kidney: Renal measurements: 11.8 x 4.6 x 5.7 cm = volume: 161 mL. Echogenicity within normal limits. No mass or hydronephrosis visualized. Left Kidney: Renal measurements: 11.3 x 6.4 x 5.2 cm = volume: 199 mL. The heterogeneous/complex area within the UPPER LEFT kidney now measures 2 x 2 x 1.9 cm, previously 3.2 x 2.5 x 2.9 cm. No other LEFT renal abnormalities are identified. Bladder: Appears normal for degree of bladder distention. Other: None. IMPRESSION: 1. Interval decrease in heterogeneous/complex area within the UPPER LEFT kidney, now measuring 2 x 2 x 1.9 cm, previously 3.2 x 2.5 x 2.9 cm on 03/27/2021. This most likely represents an improving area of infection/abscess. 2. No other significant abnormalities. Electronically Signed   By: Margarette Canada M.D.   On: 04/02/2021 12:27   US RENAL  Result Date: 03/25/2021 CLINICAL DATA:  Left pyelonephritis complicated by renal abscess, [redacted] weeks pregnant, flank pain EXAM: RENAL / URINARY TRACT ULTRASOUND COMPLETE COMPARISON:  03/24/2021, 03/13/2021 FINDINGS: Right Kidney: Renal measurements: 12.2 x 4.7 x 5.1 cm = volume: 152 mL. Echogenicity within normal limits. No mass or hydronephrosis visualized. Left Kidney: Renal measurements: 10.7 x 6.3 x 5.4 cm = volume: 193 mL. Normal echogenicity and cortical thickness. Trace pelviectasis without significant  obstruction or hydronephrosis. Upper pole intrarenal isoechoic abnormality measures 3.7 x 3.6 x 2.2 cm compatible with an evolving renal abscess without significant cystic or fluid component. Previous upper pole exophytic hypoechoic fluid collection appears to have dispersed into the perinephric and retroperitoneal space and is no longer well-circumscribed by ultrasound. Reactive left effusion noted. Bladder: Appears normal for degree of bladder distention. Other: None. IMPRESSION: 3.7 cm left upper pole intrarenal isoechoic collection is compatible with evolving renal abscess without significant fluid component. Previous adjacent left upper pole exophytic hypoechoic fluid collection appears to have dispersed into the perinephric and retroperitoneal space when compared to the MRI from yesterday. No hydronephrosis. These results were called by telephone at the time of interpretation on 03/25/2021 at 9:09 am to provider EUGENE BELL III , who verbally acknowledged these results. Electronically Signed   By: Jerilynn Mages.  Shick M.D.   On:  03/25/2021 09:10   US Renal  Addendum Date: 03/13/2021   ADDENDUM REPORT: 03/13/2021 23:18 ADDENDUM: These results were called by telephone at the time of interpretation on 03/13/2021 at 11:18 pm to provider [facility] , who verbally acknowledged these results. Electronically Signed   By: Iven Finn M.D.   On: 03/13/2021 23:18   Result Date: 03/13/2021 CLINICAL DATA:  Left ureterolithiasis with continued pain, recent stenting. Evaluate hydronephrosis and stones. EXAM: RENAL / URINARY TRACT ULTRASOUND COMPLETE COMPARISON:  Low sodium ureteral 03/04/2021, CT renal 03/19/2017 FINDINGS: Right Kidney: Renal measurements: 12.7 x 4.9 x 5.8 cm = volume: 185 mL. Echogenicity within normal limits. No mass. Mild hydronephrosis visualized. Left Kidney: Renal measurements: 11.8 x 7.6 x 6.3 cm = volume: 294 mL. Echogenicity within normal limits. Shadowing 8 mm renal calculus noted within the lower  pole. No hydronephrosis visualized. Interval development of a couple of heterogeneous lesions within the superior pole of left kidney measuring: 3.5 x 3.1 x 3.6 cm and 4.2 x 3.9 x 3.8 cm. The inferior-most lesion is noted to have internal flow on color Doppler. Bladder: Appears normal for degree of bladder distention. Other: Small left pleural effusion. IMPRESSION: 1. Interval development of a couple of heterogeneous left renal lesions measuring up to 4.2 cm that could represent blood in a dilated calyx versus abscess versus hematoma versus urinoma formation. Recommend further evaluation with CT with intravenous contrast. 2. Nonobstructive 8 mm left inferior pole nephrolithiasis. Electronically Signed: By: Iven Finn M.D. On: 03/13/2021 23:12   US Renal  Result Date: 03/04/2021 CLINICAL DATA:  LEFT flank pain. History of hydronephrosis. History of stent. Twenty-two weeks pregnant. EXAM: RENAL / URINARY TRACT ULTRASOUND COMPLETE COMPARISON:  None. FINDINGS: Right Kidney: Renal measurements: 12.6 x 5.4 x 5.5 cm = volume: 199 mL. Echogenicity within normal limits. No mass or hydronephrosis visualized. Left Kidney: Renal measurements: 12.5 x 7.3 x 6.2 cm = volume: 297 mL. Echogenicity within normal limits. No mass or hydronephrosis visualized. Stent seen within the LEFT renal pelvis. Bladder: Appears normal for degree of bladder distention. Distal portion of the LEFT nephroureteral stent is seen within the bladder. Other: None. IMPRESSION: 1. No acute findings.  No hydronephrosis bilaterally. 2. LEFT-sided nephroureteral stent appears appropriately positioned. Electronically Signed   By: Franki Cabot M.D.   On: 03/04/2021 11:30   DG Chest Port 1 View  Result Date: 04/01/2021 CLINICAL DATA:  35 year old female with left pleural effusion status post thoracentesis. EXAM: PORTABLE CHEST - 1 VIEW COMPARISON:  03/30/2021 FINDINGS: The mediastinal contours are within normal limits. No cardiomegaly. Blunting of the  left costophrenic angle, with decreased size of pleural effusion from comparison, now trace. No pneumothorax. Persistent left lower lobar consolidative opacities. No acute osseous abnormality. IMPRESSION: 1. No pneumothorax after left thoracentesis. Decreased size of left pleural effusion. 2. Persistent left lower lobe consolidative opacities. Ruthann Cancer, MD [facility] Radiology Electronically Signed   By: Ruthann Cancer MD   On: 04/01/2021 13:07   DG Chest Port 1 View  Result Date: 03/30/2021 CLINICAL DATA:  Left pleural effusion EXAM: PORTABLE CHEST 1 VIEW COMPARISON:  03/26/2021 FINDINGS: Single frontal view of the chest demonstrates enlarging left pleural effusion, with progressive left basilar consolidation. No pneumothorax. Right chest is clear. IMPRESSION: 1. Progressive left basilar consolidation and effusion. Electronically Signed   By: Randa Ngo M.D.   On: 03/30/2021 16:14   DG Chest Port 1 View  Result Date: 03/26/2021 CLINICAL DATA:  Left flank  pain. EXAM: PORTABLE CHEST 1 VIEW COMPARISON:  Chest radiograph March 26, 2021. FINDINGS: Low lung volumes. Stable cardiac and mediastinal contours. Persistent moderate left pleural effusion. Underlying consolidation. No definite pneumothorax. IMPRESSION: Persistent moderate left pleural effusion with underlying consolidation. No pneumothorax. Electronically Signed   By: Lovey Newcomer M.D.   On: 03/26/2021 18:10   DG Chest Port 1 View  Result Date: 03/26/2021 CLINICAL DATA:  Status post left thoracentesis EXAM: PORTABLE CHEST 1 VIEW COMPARISON:  03/25/2021 FINDINGS: Low lung volumes with central vascular congestion and basilar atelectasis. Persistent lingula and left lower lobe collapse/consolidation. Small residual left effusion following thoracentesis. No pneumothorax. Trachea midline. Cardiac silhouette is obscured. IMPRESSION: Negative for pneumothorax following left thoracentesis. Electronically  Signed   By: Jerilynn Mages.  Shick M.D.   On: 03/26/2021 10:50   DG OR UROLOGY CYSTO IMAGE ([facility] ONLY)  Result Date: 03/06/2021 There is no interpretation for this exam.  This order is for images obtained during a surgical procedure.  Please See "Surgeries" Tab for more information regarding the procedure.   ECHOCARDIOGRAM COMPLETE  Result Date: 03/27/2021    ECHOCARDIOGRAM REPORT   Patient Name:   Laura R [facility] Date of Exam: 03/27/2021 Medical Rec #:  [id number]          Height:       63.0 in Accession #:    [id number]         Weight:       153.0 lb Date of Birth:  09/18/1985          BSA:          1.726 m Patient Age:    34 years           BP:           94/64 mmHg Patient Gender: F                  HR:           75 bpm. Exam Location:  [facility] Procedure: 2D Echo, Cardiac Doppler and Color Doppler Indications:     Acute respiratory distress R06.03  History:         Patient has no prior history of Echocardiogram examinations.                  Migraines.  Sonographer:     Sherrie Sport RDCS (AE) Referring Phys:  [id number] [place] Diagnosing Phys: Neoma Laming MD  Sonographer Comments: Suboptimal apical window. IMPRESSIONS  1. Left ventricular ejection fraction, by estimation, is 50 to 55%. The left ventricle has low normal function. The left ventricle demonstrates regional wall motion abnormalities (see scoring diagram/findings for description). There is mild concentric left ventricular hypertrophy. Left ventricular diastolic parameters are consistent with Grade II diastolic dysfunction (pseudonormalization).  2. Right ventricular systolic function is normal. The right ventricular size is normal.  3. Left atrial size was moderately dilated.  4. Right atrial size was moderately dilated.  5. The mitral valve is normal in structure. Mild mitral valve regurgitation. No evidence of mitral stenosis.  6. The aortic valve is normal in structure. Aortic valve regurgitation is not visualized. No aortic stenosis is present.  7. The  inferior vena cava is normal in size with greater than 50% respiratory variability, suggesting right atrial pressure of 3 mmHg. FINDINGS  Left Ventricle: Left ventricular ejection fraction, by estimation, is 50 to 55%. The left ventricle has low normal function. The left ventricle demonstrates regional wall motion abnormalities. The left  ventricular internal cavity size was normal in size. There is mild concentric left ventricular hypertrophy. Left ventricular diastolic parameters are consistent with Grade II diastolic dysfunction (pseudonormalization). Right Ventricle: The right ventricular size is normal. No increase in right ventricular wall thickness. Right ventricular systolic function is normal. Left Atrium: Left atrial size was moderately dilated. Right Atrium: Right atrial size was moderately dilated. Pericardium: There is no evidence of pericardial effusion. Mitral Valve: The mitral valve is normal in structure. Mild mitral valve regurgitation. No evidence of mitral valve stenosis. Tricuspid Valve: The tricuspid valve is normal in structure. Tricuspid valve regurgitation is mild . No evidence of tricuspid stenosis. Aortic Valve: The aortic valve is normal in structure. Aortic valve regurgitation is not visualized. No aortic stenosis is present. Aortic valve mean gradient measures 3.5 mmHg. Aortic valve peak gradient measures 6.4 mmHg. Aortic valve area, by VTI measures 1.97 cm. Pulmonic Valve: The pulmonic valve was normal in structure. Pulmonic valve regurgitation is not visualized. No evidence of pulmonic stenosis. Aorta: The aortic root is normal in size and structure. Venous: The inferior vena cava is normal in size with greater than 50% respiratory variability, suggesting right atrial pressure of 3 mmHg. IAS/Shunts: No atrial level shunt detected by color flow Doppler.  LEFT VENTRICLE PLAX 2D LVIDd:         4.34 cm  Diastology LVIDs:         3.16 cm  LV e' medial:    10.70 cm/s LV PW:         1.17 cm   LV E/e' medial:  10.1 LV IVS:        0.83 cm  LV e' lateral:   13.30 cm/s LVOT diam:     2.00 cm  LV E/e' lateral: 8.1 LV SV:         48 LV SV Index:   28 LVOT Area:     3.14 cm  RIGHT VENTRICLE RV Basal diam:  3.98 cm RV S prime:     14.40 cm/s TAPSE (M-mode): 3.6 cm LEFT ATRIUM             Index       RIGHT ATRIUM           Index LA diam:        2.60 cm 1.51 cm/m  RA Area:     27.30 cm LA Vol (A2C):   79.3 ml 45.96 ml/m RA Volume:   101.00 ml 58.53 ml/m LA Vol (A4C):   75.8 ml 43.93 ml/m LA Biplane Vol: 82.7 ml 47.93 ml/m  AORTIC VALVE                   PULMONIC VALVE AV Area (Vmax):    1.82 cm    PV Vmax:        0.85 m/s AV Area (Vmean):   1.85 cm    PV Peak grad:   2.9 mmHg AV Area (VTI):     1.97 cm    RVOT Peak grad: 4 mmHg AV Vmax:           126.50 cm/s AV Vmean:          90.400 cm/s AV VTI:            0.245 m AV Peak Grad:      6.4 mmHg AV Mean Grad:      3.5 mmHg LVOT Vmax:         73.10 cm/s LVOT Vmean:  53.100 cm/s LVOT VTI:          0.154 m LVOT/AV VTI ratio: 0.63  AORTA Ao Root diam: 2.70 cm MITRAL VALVE                TRICUSPID VALVE MV Area (PHT): 3.79 cm     TR Peak grad:   23.8 mmHg MV Decel Time: 200 msec     TR Vmax:        244.00 cm/s MV E velocity: 108.00 cm/s MV A velocity: 60.40 cm/s   SHUNTS MV E/A ratio:  1.79         Systemic VTI:  0.15 m                             Systemic Diam: 2.00 cm Neoma Laming MD Electronically signed by Neoma Laming MD Signature Date/Time: 03/27/2021/3:33:00 PM    Final    [place] EKG SITE RITE  Result Date: 04/01/2021 If Site Rite image not attached, placement could not be confirmed due to current cardiac rhythm.  US THORACENTESIS ASP PLEURAL SPACE W/IMG GUIDE  Result Date: 04/01/2021 INDICATION: 35 year old female with left pleural effusion. EXAM: ULTRASOUND GUIDED LEFT THORACENTESIS MEDICATIONS: None. COMPLICATIONS: None immediate. PROCEDURE: An ultrasound guided thoracentesis was thoroughly discussed with the patient and questions answered. The  benefits, risks, alternatives and complications were also discussed. The patient understands and wishes to proceed with the procedure. Written consent was obtained. Ultrasound was performed to localize and mark an adequate pocket of fluid in the left chest. The area was then prepped and draped in the normal sterile fashion. 1% Lidocaine was used for local anesthesia. Under ultrasound guidance a 19 gauge, 7-cm, Yueh catheter was introduced. Thoracentesis was performed. The catheter was removed and a dressing applied. FINDINGS: A total of approximately 450 mL of translucent, green tinged fluid was removed. IMPRESSION: Successful ultrasound guided left thoracentesis yielding 450 mL of pleural fluid. Ruthann Cancer, MD [facility] Radiology Electronically Signed   By: Ruthann Cancer MD   On: 04/01/2021 13:08   US THORACENTESIS ASP PLEURAL SPACE W/IMG GUIDE  Result Date: 03/26/2021 INDICATION: Shortness of breath, chest pain, left pleural effusion EXAM: ULTRASOUND GUIDED LEFT THORACENTESIS MEDICATIONS: 1% lidocaine local COMPLICATIONS: None immediate. PROCEDURE: An ultrasound guided thoracentesis was thoroughly discussed with the patient and questions answered. The benefits, risks, alternatives and complications were also discussed. The patient understands and wishes to proceed with the procedure. Written consent was obtained. Ultrasound was performed to localize and mark an adequate pocket of fluid in the left chest. The area was then prepped and draped in the normal sterile fashion. 1% Lidocaine was used for local anesthesia. Under ultrasound guidance a 6 Fr Safe-T-Centesis catheter was introduced. Thoracentesis was performed. The catheter was removed and a dressing applied. FINDINGS: A total of approximately 575 cc of serosanguineous pleural fluid was removed. Samples were sent to the laboratory as requested by the clinical team. IMPRESSION: Successful ultrasound  guided left thoracentesis yielding 575 cc of pleural fluid. Electronically Signed   By: Jerilynn Mages.  Shick M.D.   On: 03/26/2021 10:48      Labs: BNP (last 3 results) Recent Labs    03/25/21 1332 03/27/21 0953  BNP 63.3 42.3   Basic Metabolic Panel: Recent Labs  Lab 03/27/21 0953 03/27/21 0953 03/28/21 0415 03/30/21 0504 03/31/21 0452 04/01/21 0604 04/02/21 0630 04/03/21 0433  NA  --    < > 132* 136  137 135 135 133*  K  --    < > 3.3* 3.5 3.2* 4.0 4.0 4.1  CL  --    < > 101 105 107 105 101 103  CO2  --    < > '23 24 23 24 27 24  ' GLUCOSE  --    < > 76 93 116* 103* 96 96  BUN  --    < > '7 7 9 9 9 9  ' CREATININE  --    < > 0.76 0.54 0.71 0.66 0.66 0.57  CALCIUM  --    < > 8.0* 8.4* 8.1* 8.3* 8.4* 8.1*  MG 1.7  --  1.6* 2.0 1.8 1.6* 1.8 1.8  PHOS 3.3  --  2.9  --   --   --   --   --    < > = values in this interval not displayed.   Liver Function Tests: Recent Labs  Lab 03/27/21 0953 03/31/21 0452  AST 92* 52*  ALT 147* 83*  ALKPHOS 106 108  BILITOT 1.2 0.6  PROT 6.5 5.9*  ALBUMIN 2.1* 2.0*   No results for input(s): LIPASE, AMYLASE in the last 168 hours. No results for input(s): AMMONIA in the last 168 hours. CBC: Recent Labs  Lab 03/28/21 0415 03/30/21 0504 03/31/21 0452 04/01/21 0604 04/02/21 0630 04/03/21 0433  WBC 9.2 8.8 8.9 10.1 8.4 9.8  NEUTROABS 7.1  --   --   --   --   --   HGB 9.3* 9.9* 9.8* 9.4* 10.0* 9.7*  HCT 28.8* 30.1* 29.4* 28.4* 31.1* 29.1*  MCV 91.4 89.3 90.7 89.0 89.9 88.4  PLT 419* 445* 421* 413* 395 399   Cardiac Enzymes: No results for input(s): CKTOTAL, CKMB, CKMBINDEX, TROPONINI in the last 168 hours. BNP: Invalid input(s): POCBNP CBG: No results for input(s): GLUCAP in the last 168 hours. D-Dimer No results for input(s): DDIMER in the last 72 hours. Hgb A1c No results for input(s): HGBA1C in the last 72 hours. Lipid Profile No results for input(s): CHOL, HDL, LDLCALC, TRIG, CHOLHDL, LDLDIRECT in the last 72 hours. Thyroid  function studies No results for input(s): TSH, T4TOTAL, T3FREE, THYROIDAB in the last 72 hours.  Invalid input(s): FREET3 Anemia work up No results for input(s): VITAMINB12, FOLATE, FERRITIN, TIBC, IRON, RETICCTPCT in the last 72 hours. Urinalysis    Component Value Date/Time   COLORURINE YELLOW (A) 03/13/2021 1755   APPEARANCEUR Cloudy (A) 03/24/2021 1259   LABSPEC 1.017 03/13/2021 1755   PHURINE 8.0 03/13/2021 1755   GLUCOSEU Negative 03/24/2021 1259   HGBUR MODERATE (A) 03/13/2021 1755   BILIRUBINUR Negative 03/24/2021 1259   KETONESUR NEGATIVE 03/13/2021 1755   PROTEINUR 2+ (A) 03/24/2021 1259   PROTEINUR 100 (A) 03/13/2021 1755   UROBILINOGEN 0.2 03/23/2021 1456   NITRITE Negative 03/24/2021 1259   NITRITE NEGATIVE 03/13/2021 1755   LEUKOCYTESUR 3+ (A) 03/24/2021 1259   LEUKOCYTESUR LARGE (A) 03/13/2021 1755   Sepsis Labs Invalid input(s): PROCALCITONIN,  WBC,  LACTICIDVEN Microbiology Recent Results (from the past 240 hour(s))  Microscopic Examination     Status: Abnormal   Collection Time: 03/24/21 12:59 PM   Urine  Result Value Ref Range Status   WBC, UA >30 (A) 0 - 5 /hpf Final   RBC 11-30 (A) 0 - 2 /hpf Final   Epithelial Cells (non renal) 0-10 0 - 10 /hpf Final   Casts Present (A) None seen /lpf Final   Cast Type Hyaline casts N/A Final  Mucus, UA Present (A) Not Estab. Final   Bacteria, UA Moderate (A) None seen/Few Final  CULTURE, URINE COMPREHENSIVE     Status: None   Collection Time: 03/24/21  1:50 PM   Specimen: Urine   UR  Result Value Ref Range Status   Urine Culture, Comprehensive Final report  Final   Organism ID, Bacteria Comment  Final    Comment: No growth in 36 - 48 hours.  SARS CORONAVIRUS 2 (TAT 6-24 HRS) Nasopharyngeal Nasopharyngeal Swab     Status: None   Collection Time: 03/24/21  6:15 PM   Specimen: Nasopharyngeal Swab  Result Value Ref Range Status   SARS Coronavirus 2 NEGATIVE NEGATIVE Final    Comment: (NOTE) SARS-CoV-2 target  nucleic acids are NOT DETECTED.  The SARS-CoV-2 RNA is generally detectable in upper and lower respiratory specimens during the acute phase of infection. Negative results do not preclude SARS-CoV-2 infection, do not rule out co-infections with other pathogens, and should not be used as the sole basis for treatment or other patient management decisions. Negative results must be combined with clinical observations, patient history, and epidemiological information. The expected result is Negative.  Fact Sheet for Patients: SugarRoll.be  Fact Sheet for Healthcare Providers: https://www.woods-mathews.com/  This test is not yet approved or cleared by the [place] FDA and  has been authorized for detection and/or diagnosis of SARS-CoV-2 by FDA under an Emergency Use Authorization (EUA). This EUA will remain  in effect (meaning this test can be used) for the duration of the COVID-19 declaration under Se ction 564(b)(1) of the Act, 21 U.S.C. section 360bbb-3(b)(1), unless the authorization is terminated or revoked sooner.  Performed at [facility], [place] [address] [place], [place] [address]   Urine Culture     Status: Abnormal   Collection Time: 03/24/21 10:49 PM   Specimen: Urine, Clean Catch  Result Value Ref Range Status   Specimen Description   Final    URINE, CLEAN CATCH Performed at [facility], [address], [place], [place] [address]    Special Requests   Final    NONE Performed at [facility], [place], [place] [address]    Culture 20,000 COLONIES/mL YEAST (A)  Final   Report Status 03/26/2021 FINAL  Final  Culture, blood (routine x 2)     Status: None   Collection Time: 03/25/21  9:06 AM   Specimen: BLOOD RIGHT HAND  Result Value Ref Range Status   Specimen Description BLOOD RIGHT HAND  Final   Special Requests   Final    BOTTLES DRAWN AEROBIC AND ANAEROBIC Blood Culture  adequate volume   Culture   Final    NO GROWTH 5 DAYS Performed at [facility], [address], [place], [place] [address]    Report Status 03/30/2021 FINAL  Final  Culture, blood (routine x 2)     Status: None   Collection Time: 03/25/21  9:20 AM   Specimen: BLOOD LEFT HAND  Result Value Ref Range Status   Specimen Description BLOOD LEFT HAND  Final   Special Requests   Final    BOTTLES DRAWN AEROBIC AND ANAEROBIC Blood Culture adequate volume   Culture   Final    NO GROWTH 5 DAYS Performed at [facility], [address] [place], [place] [address]    Report Status 03/30/2021 FINAL  Final  Acid Fast Smear (AFB)     Status: None   Collection Time: 03/26/21 10:30  AM   Specimen: PATH Cytology Pleural fluid  Result Value Ref Range Status   AFB Specimen Processing Concentration  Final   Acid Fast Smear Negative  Final    Comment: (NOTE) Performed At: [facility] [place], [place] [id number] Rush Farmer MD CN:[id number]    Source (AFB) PLEURAL  Final    Comment: Performed at [facility], [address] [place], [place] [address]  Body fluid culture w Gram Stain     Status: None   Collection Time: 03/26/21 10:30 AM   Specimen: PATH Cytology Pleural fluid  Result Value Ref Range Status   Specimen Description   Final    PLEURAL Performed at [facility], [address] [place], [place] [address]    Special Requests   Final    PLEURAL Performed at [facility], [address] [place], [place] [address]    Gram Stain   Final    RARE WBC PRESENT,BOTH PMN AND MONONUCLEAR NO ORGANISMS SEEN    Culture   Final    NO GROWTH 3 DAYS Performed at [facility], [place] [address], [place], [place] [address]    Report Status 03/30/2021 FINAL  Final  MRSA Next Gen by PCR, Nasal     Status: None   Collection Time: 03/26/21 10:06 PM   Specimen: Nasal Mucosa; Nasal Swab  Result Value Ref Range Status    MRSA by PCR Next Gen NOT DETECTED NOT DETECTED Final    Comment: (NOTE) The GeneXpert MRSA Assay (FDA approved for NASAL specimens only), is one component of a comprehensive MRSA colonization surveillance program. It is not intended to diagnose MRSA infection nor to guide or monitor treatment for MRSA infections. Test performance is not FDA approved in patients less than 41 years old. Performed at [facility], [address] [place], [place] [address]   Body fluid culture w Gram Stain     Status: None (Preliminary result)   Collection Time: 04/01/21 12:40 PM   Specimen: PATH Cytology Pleural fluid  Result Value Ref Range Status   Specimen Description   Final    PLEURAL Performed at [facility], [address], [place], [place] [address]    Special Requests   Final    NONE Performed at [facility], [address] [place], [place] [address]    Gram Stain   Final    ABUNDANT WBC PRESENT,BOTH PMN AND MONONUCLEAR NO ORGANISMS SEEN Performed at [facility], [place] [address], [place], [place] [address]    Culture PENDING  Incomplete   Report Status PENDING  Incomplete     Total time spend on discharging this patient, including the last patient exam, discussing the hospital stay, instructions for ongoing care as it relates to all pertinent caregivers, as well as preparing the medical discharge records, prescriptions, and/or referrals as applicable, is 60 minutes.    Enzo Bi, MD  [facility] 04/03/2021, 8:59 AM

## 2021-04-03 NOTE — Progress Notes (Addendum)
Pt discharged home.  Discharge instructions, prescriptions and follow up appointment given to and reviewed with pt. Home Health to visit patient home this evening. Pt verbalized understanding.  Escorted by auxillary.

## 2021-04-03 NOTE — TOC Progression Note (Signed)
Transition of Care [facility]) - Progression Note    Patient Details  Name: [place] R Reiling MRN: [id number] Date of Birth: 02-Apr-1986  Transition of Care [facility]) CM/SW Contact  Hetty Ely, RN Phone Number: 04/03/2021, 3:55 PM  Clinical Narrative:  Patient to discharge to mother's home in [place], however during the week will live in [place] With boyfriend and his mother who is a retired Engineer, civil (consulting) says patient. [facility], [phone] Shona Needles will have IV antibiotics and supplies mailed to [place] home and patient will receive Education , labs and pic-line care from [facility]. [facility] will educate patient tonight on how to administer antibiotics. TOC needs resolved.      Barriers to Discharge: Barriers Resolved  Expected Discharge Plan and Services           Expected Discharge Date: 04/03/21               DME Arranged: Other see comment (IV Equipment) DME Agency: Other - Comment ([facility]) Date DME Agency Contacted: 04/03/21 Time DME Agency Contacted: 1545   HH Arranged: RN HH Agency: Other - See comment [facility]) Date HH Agency Contacted: 04/03/21 Time HH Agency Contacted: 1546 Representative spoke with at [facility] Agency: [place]   Social Determinants of Health (SDOH) Interventions    Readmission Risk Interventions No flowsheet data found.

## 2021-04-05 ENCOUNTER — Encounter: Payer: Self-pay | Admitting: Urology

## 2021-04-05 LAB — BODY FLUID CULTURE W GRAM STAIN: Culture: NO GROWTH

## 2021-04-05 NOTE — Telephone Encounter (Signed)
Pt called no answer LM via VM to please contact the office as soon as possible to go over information from [facility].

## 2021-04-06 ENCOUNTER — Telehealth: Payer: Self-pay

## 2021-04-06 NOTE — Telephone Encounter (Signed)
Spoke to Jeffers on 04/05/2021 concerning the patient. Rosa stated that patient is aware that [facility] has reached out to the patient and informed patient that she needed to come to the [facility] office to complete forms.

## 2021-04-06 NOTE — Telephone Encounter (Signed)
Spoke to pt concerning WIC contacting the office concerning enrolling in [facility]. Pt stated that she spoke to someone at [facility]. Rosie spoke to pt as well.

## 2021-04-10 DIAGNOSIS — N111 Chronic obstructive pyelonephritis: Secondary | ICD-10-CM | POA: Diagnosis not present

## 2021-04-10 DIAGNOSIS — N151 Renal and perinephric abscess: Secondary | ICD-10-CM | POA: Diagnosis not present

## 2021-04-11 ENCOUNTER — Telehealth: Payer: Self-pay

## 2021-04-11 ENCOUNTER — Other Ambulatory Visit: Payer: Medicaid Other

## 2021-04-11 ENCOUNTER — Encounter: Payer: Medicaid Other | Admitting: Obstetrics and Gynecology

## 2021-04-11 DIAGNOSIS — J984 Other disorders of lung: Secondary | ICD-10-CM | POA: Diagnosis not present

## 2021-04-11 DIAGNOSIS — R0602 Shortness of breath: Secondary | ICD-10-CM | POA: Diagnosis not present

## 2021-04-11 DIAGNOSIS — R109 Unspecified abdominal pain: Secondary | ICD-10-CM | POA: Insufficient documentation

## 2021-04-11 DIAGNOSIS — R918 Other nonspecific abnormal finding of lung field: Secondary | ICD-10-CM | POA: Diagnosis not present

## 2021-04-11 NOTE — Telephone Encounter (Signed)
Attempted to contact to verify patient is getting Ceftriaxone IV Need to see if they are doing labs also, Left vm to call office. Please

## 2021-04-13 ENCOUNTER — Encounter: Payer: Self-pay | Admitting: Infectious Diseases

## 2021-04-13 ENCOUNTER — Other Ambulatory Visit: Payer: Self-pay

## 2021-04-13 ENCOUNTER — Ambulatory Visit: Payer: Medicaid Other | Attending: Infectious Diseases | Admitting: Infectious Diseases

## 2021-04-13 VITALS — BP 107/66 | HR 93 | Resp 16 | Ht 63.0 in | Wt 152.0 lb

## 2021-04-13 DIAGNOSIS — J90 Pleural effusion, not elsewhere classified: Secondary | ICD-10-CM | POA: Insufficient documentation

## 2021-04-13 DIAGNOSIS — Z3A28 28 weeks gestation of pregnancy: Secondary | ICD-10-CM | POA: Insufficient documentation

## 2021-04-13 DIAGNOSIS — Z6827 Body mass index (BMI) 27.0-27.9, adult: Secondary | ICD-10-CM | POA: Diagnosis not present

## 2021-04-13 DIAGNOSIS — N10 Acute tubulo-interstitial nephritis: Secondary | ICD-10-CM | POA: Diagnosis not present

## 2021-04-13 DIAGNOSIS — Z79899 Other long term (current) drug therapy: Secondary | ICD-10-CM | POA: Insufficient documentation

## 2021-04-13 DIAGNOSIS — O26833 Pregnancy related renal disease, third trimester: Secondary | ICD-10-CM | POA: Diagnosis not present

## 2021-04-13 DIAGNOSIS — Z87442 Personal history of urinary calculi: Secondary | ICD-10-CM | POA: Insufficient documentation

## 2021-04-13 DIAGNOSIS — N151 Renal and perinephric abscess: Secondary | ICD-10-CM | POA: Diagnosis not present

## 2021-04-13 DIAGNOSIS — O2343 Unspecified infection of urinary tract in pregnancy, third trimester: Secondary | ICD-10-CM | POA: Insufficient documentation

## 2021-04-13 NOTE — Progress Notes (Signed)
NAME: Laura Sosa  DOB: 13-Jan-1986  MRN: [id number]  Date/Time: 04/13/2021 11:49 AM  Subjective:   ? Laura R Hally is a 35 y.o. female who is [redacted] weeks pregnant is here after recent hospitalization.  She was hospitalized at [facility] between 03/24/2021 until 04/03/2021 for complicated urinary tract infection. She was discharged home on IV ceftriaxone to complete a total of 4 weeks of antibiotics. .  Patient has a complicated renal infection history.  She was admitted initially on 02/25/2021 with left-sided flank pain and found to have a left PE UVJ calculus and hydronephrosis.  She had a stent placement on 02/26/2021.  Had pansensitive E. coli urine culture.  After IV ceftriaxone in the hospital she was discharged home on Macrobid on 03/02/2021.  She came back to the ED on 03/04/2021 with pain.  Underwent left ureteroscopy and laser lithotripsy and stent exchange on 03/06/2021.  She received ceftriaxone from 03/04/2021 until 03/06/2021.  She presented to the ED again on 03/13/2021 with left hip pain.  She had imaging and was discharged from the ED on Keflex.  She went to [facility] on 03/15/2021 and stayed until 03/15/2021 and got IV ceftriaxone.  Urine culture was mixed flora. Ultrasound of the kidneys showed 2 complex hypoechoic collections within the left kidney measuring up to 4.8 cm.  It questioned either hematoma or postsurgical sequelae with phlegmon.  Renal abscess was in the differential diagnosis.  She was discharged on intramuscular ceftriaxone to get it at the urology office.  She got IM from 7/26 until 03/24/2021.  On 03/24/2021 she was continuing to have left flank pain, Pyuria and hematuria and got admitted to [facility].  Ultrasound showed multiple left renal lesions measuring up to 4 cm similar to the previous one. She was having shortness of breath and underwent a CT angio of the chest on 03/25/2021 and that showed moderate to large left pleural effusion.  On 03/26/2021 she underwent thoracentesis with a total of 575 cc  of serosanguineous pleural fluid was removed.  Following the procedure patient was in severe pain with spasm on the left side of the chest causing her to have shortness of breath.  The fluid accumulated pretty quickly and she underwent a second thoracentesis on 04/02/2021.  The cultures from the pleural fluid was negative. Patient by this time has been on ceftriaxone since admission.  I saw the patient while she was in the hospital and decided to treat her for 4 full weeks of IV antibiotics for the renal abscess.  She also had an ultrasound of the kidney before her discharge on 04/02/2021 And that showed decreasing measurements of the heterogenous complex area in the left kidney.  It measured 2 into 2 into 1.9 cm compared to 3.2 into 2.5 and a 2.9 cm previously. Patient has been doing well since discharge.  The pain in the left flank is much improved. She is able to breathe better.  She saw Dr. Karna Christmas pulmonologist on 04/11/2021 and a repeat chest x-ray done in his office showed significantly decreased pleural fluid and the x-ray was almost near normal. She has no fever Appetite is good She does not have any diarrhea She is currently living in [place] She is taking her IV antibiotics every day She has not missed any doses   Past Medical History:  Diagnosis Date   Bilateral kidney stones    Headache    History of kidney stones    Migraine    Pyelonephritis 04/17/2016    Past Surgical  History:  Procedure Laterality Date   COLPOSCOPY     CYSTOSCOPY WITH STENT PLACEMENT Left 02/26/2021   Procedure: CYSTOSCOPY WITH STENT PLACEMENT;  Surgeon: Crista Elliot, MD;  Location: [facility] ORS;  Service: Urology;  Laterality: Left;   CYSTOSCOPY/RETROGRADE/URETEROSCOPY  03/15/2017   Procedure: CYSTOSCOPY/RETROGRADE/URETEROSCOPY;  Surgeon: Hildred Laser, MD;  Location: [facility] ORS;  Service: Urology;;   CYSTOSCOPY/URETEROSCOPY/HOLMIUM LASER/STENT PLACEMENT Left 03/06/2021   Procedure:  CYSTOSCOPY/URETEROSCOPY/HOLMIUM LASER/STENT PLACEMENT;  Surgeon: Vanna Scotland, MD;  Location: [facility] ORS;  Service: Urology;  Laterality: Left;   LEEP      Social History   Socioeconomic History   Marital status: Single    Spouse name: Not on file   Number of children: Not on file   Years of education: Not on file   Highest education level: Not on file  Occupational History   Not on file  Tobacco Use   Smoking status: Never   Smokeless tobacco: Never  Vaping Use   Vaping Use: Never used  Substance and Sexual Activity   Alcohol use: No   Drug use: No   Sexual activity: Yes  Other Topics Concern   Not on file  Social History Narrative   Not on file   Social Determinants of Health   Financial Resource Strain: Not on file  Food Insecurity: Not on file  Transportation Needs: Not on file  Physical Activity: Not on file  Stress: Not on file  Social Connections: Not on file  Intimate Partner Violence: Not on file    Family History  Problem Relation Age of Onset   Hypertension Mother    Diabetes Mother    Diabetes Father    Hypertension Father    Emphysema Maternal Grandmother    Lung cancer Maternal Grandmother    Asthma Daughter    Hypertension Paternal Grandmother    Heart defect Half-Sister        "boot shaped heart"   Kidney cancer Neg Hx    Bladder Cancer Neg Hx    Breast cancer Neg Hx    Ovarian cancer Neg Hx    Colon cancer Neg Hx    No Known Allergies I? Current Outpatient Medications  Medication Sig Dispense Refill   acetaminophen (TYLENOL) 325 MG tablet Take 2 tablets (650 mg total) by mouth every 4 (four) hours as needed (for pain scale < 4  OR  temperature  >/=  100.5 F). 60 tablet 0   cefTRIAXone (ROCEPHIN) IVPB Inject 2 g into the vein daily for 19 days. Method of administration: IV Push Method of administration may be changed at the discretion of home infusion pharmacist based upon assessment of the patient and/or caregiver's ability to  self-administer the medication ordered.Indication:  Left Pyelonephritis, renal abscess versus phlegmon First Dose: No Last Day of Therapy:  04/21/21 [facility] Care Per Protocol:including placement of biopatch   Labs weekly while on IV antibiotics: _X_ CBC with differential _X_ CMP _X_ Please pull PIC at completion of IV antibiotics _ Fax weekly labs to  Dr.Augustino Savastano [phone]   Clinic Follow Up Appt: 04/13/21 at noon   Call [phone] with any questions (Patient taking differently: Inject 2 g into the vein daily. Method of administration: IV Push Method of administration may be changed at the discretion of home infusion pharmacist based upon assessment of the patient and/or caregiver's ability to self-administer the medication ordered.Indication:  Left Pyelonephritis, renal abscess versus phlegmon First Dose: No Last Day of Therapy:  04/21/21 [facility] Care Per Protocol:including  placement of biopatch   Labs weekly while on IV antibiotics: _X_ CBC with differential _X_ CMP _X_ Please pull PIC at completion of IV antibiotics _ Fax weekly labs to  Dr.Jonay Hitchcock [phone]   Clinic Follow Up Appt: 04/13/21 at noon   Call [phone] with any questions) 19 Units 0   cyclobenzaprine (FLEXERIL) 10 MG tablet Take 1 tablet (10 mg total) by mouth 3 (three) times daily as needed for muscle spasms. 30 tablet 2   docusate sodium (COLACE) 100 MG capsule Take 1 capsule (100 mg total) by mouth 2 (two) times daily as needed for mild constipation. 60 capsule 1   ferrous sulfate 325 (65 FE) MG tablet Take 1 tablet (325 mg total) by mouth 2 (two) times daily with a meal. 60 tablet 3   hydrOXYzine (ATARAX/VISTARIL) 25 MG tablet Take 1 tablet (25 mg total) by mouth 3 (three) times daily as needed for anxiety. 20 tablet 1   ondansetron (ZOFRAN-ODT) 4 MG disintegrating tablet Take 1 tablet (4 mg total) by mouth every 8 (eight) hours as needed for nausea or vomiting. 60 tablet 0   Prenatal Vit-Fe  Fumarate-FA (MULTIVITAMIN-PRENATAL) 27-0.8 MG TABS tablet Take 1 tablet by mouth daily at 12 noon.     No current facility-administered medications for this visit.     Abtx:  Anti-infectives (From admission, onward)    None       REVIEW OF SYSTEMS:  Const: negative fever, negative chills, negative weight loss Eyes: negative diplopia or visual changes, negative eye pain ENT: negative coryza, negative sore throat Resp: negative cough, hemoptysis, dyspnea Cards: negative for chest pain, palpitations, lower extremity edema GU: negative for frequency, dysuria and hematuria GI: Negative for abdominal pain, diarrhea, bleeding, constipation Skin: negative for rash and pruritus Heme: negative for easy bruising and gum/nose bleeding MS: negative for myalgias, arthralgias, back pain and muscle weakness Neurolo:negative for headaches, dizziness, vertigo, memory problems  Psych: negative for feelings of anxiety, depression  Endocrine: negative for thyroid, diabetes Allergy/Immunology- negative for any medication or food allergies ? VITALS:  BP 107/66   Pulse 93   Resp 16   Ht 5\' 3"  (1.6 m)   Wt 152 lb (68.9 kg)   LMP  (LMP Unknown)   SpO2 98%   BMI 26.93 kg/m  PHYSICAL EXAM:  General: Alert, cooperative, no distress, appears stated age.  Head: Normocephalic, without obvious abnormality, atraumatic. Eyes: Conjunctivae clear, anicteric sclerae. Pupils are equal ENT Nares normal. No drainage or sinus tenderness. Lips, mucosa, and tongue normal. No Thrush Neck: Supple, symmetrical, no adenopathy, thyroid: non tender no carotid bruit and no JVD. Back: No CVA tenderness. Lungs: Bilateral air entry.  Reduced in the left lower lung heart: Regular rate and rhythm, no murmur, rub or gallop. Abdomen: Uterus palpable up to the umbilicus. Extremities: Right arm PICC line in good position Has Biopatch Atraumatic, no cyanosis. No edema. No clubbing Skin: Tattoos Lymph: Cervical,  supraclavicular normal. Neurologic: Grossly non-focal Pertinent Labs Labs done as outpatient l  ? Impression/Recommendation ? Complicated urinary tract infection secondary to renal stones status post laser lithotripsy and stent exchange in the left kidney E. coli urinary tract infection Renal phlegmon versus abscess in the left upper pole Currently on IV ceftriaxone.  She will complete 4 weeks of IV antibiotic on 04/21/2021.  She will need repeat urine culture when she is assessed by the urologist in the first week of September..  And management as per urine culture.  Will get an ultrasound on  04/18/2021 to assess the left kidney  Left pleural effusion.  Was an exudate.  Culture was negative.  Its almost resolved now.  Followed by pulmonologist. ? __Follow-up with urology _________________________________________________ Discussed with patient in great detail and laid out the plan. Note:  This document was prepared using Dragon voice recognition software and may include unintentional dictation errors.

## 2021-04-13 NOTE — Patient Instructions (Addendum)
Follow up visit for left renal infection with e.coli- you had a renal abscess /phlegmon and you are getting IV ceftriaxone for 4 weeks. Your end date is 04/21/21 We will get another ultrasound before you complete your antibiotics around 04/18/21

## 2021-04-18 ENCOUNTER — Other Ambulatory Visit: Payer: Self-pay

## 2021-04-18 ENCOUNTER — Ambulatory Visit
Admission: RE | Admit: 2021-04-18 | Discharge: 2021-04-18 | Disposition: A | Discharge status: Home / Self Care | Payer: Medicaid Other | Source: Ambulatory Visit | Attending: Infectious Diseases | Admitting: Infectious Diseases

## 2021-04-18 DIAGNOSIS — N2889 Other specified disorders of kidney and ureter: Secondary | ICD-10-CM | POA: Diagnosis not present

## 2021-04-18 DIAGNOSIS — N151 Renal and perinephric abscess: Secondary | ICD-10-CM | POA: Diagnosis not present

## 2021-04-18 DIAGNOSIS — Z872 Personal history of diseases of the skin and subcutaneous tissue: Secondary | ICD-10-CM | POA: Diagnosis not present

## 2021-04-18 DIAGNOSIS — N059 Unspecified nephritic syndrome with unspecified morphologic changes: Secondary | ICD-10-CM | POA: Diagnosis not present

## 2021-04-20 ENCOUNTER — Ambulatory Visit: Payer: Medicaid Other | Attending: Infectious Diseases | Admitting: Infectious Diseases

## 2021-04-20 ENCOUNTER — Telehealth: Payer: Self-pay

## 2021-04-20 DIAGNOSIS — N390 Urinary tract infection, site not specified: Secondary | ICD-10-CM

## 2021-04-20 DIAGNOSIS — B9620 Unspecified Escherichia coli [E. coli] as the cause of diseases classified elsewhere: Secondary | ICD-10-CM

## 2021-04-20 MED ORDER — AMOXICILLIN-POT CLAVULANATE 875-125 MG PO TABS: 1.0000 | ORAL_TABLET | Freq: Two times a day (BID) | ORAL | 0 refills | Status: DC

## 2021-04-20 MED ORDER — CEPHALEXIN 500 MG PO CAPS: 500.0000 mg | ORAL_CAPSULE | Freq: Four times a day (QID) | ORAL | 0 refills | Status: DC

## 2021-04-20 NOTE — Telephone Encounter (Signed)
Reached out to patient after she sent mychart message requesting to remain on PICC Line/IV abx. Advised that we do not extend IV Abx beyond necessary as it causes risk for another infection. Also advised her that we can swap to Augmentin that is twice daily instead of 4 times daily Keflex. Called [facility] and canceled Keflex and Dr Rivka Safer sent Augmentin. If she gets sick on this oral Abx she is to call us ASAP to see what steps to take.

## 2021-04-20 NOTE — Progress Notes (Signed)
The purpose of this virtual visit is to provide medical care while limiting exposure to the novel coronavirus (COVID19) for both patient and office staff.   Consent was obtained for phone visit:  Yes.   Answered questions that patient had about telehealth interaction:  Yes.   I discussed the limitations, risks, security and privacy concerns of performing an evaluation and management service by telephone. I also discussed with the patient that there may be a patient responsible charge related to this service. The patient expressed understanding and agreed to proceed.   Patient Location: Home Provider Location: office Pt, Laura Sosa and provider on call PT with left renal stone, p lithotripsy, complicated UTI with e.coli, left renal phlegmon /versus abscess Left pleural effusion s/p thoracentesis X 2- culture neg but increased wbc- suggestive of exudate  Doing much better No pain left flank Breathing better Still some left pleuritic tightness 100% adherent to Iv ceftriaxone Will complete in 48 hrs and PICC will be removed Repeat US from 8/23 shows improvement in the left kidney phlegmon/abscess She will take Po augmentin after that ( she prefers BID dose to TID- hence this instead of amoxil or keflex) She will follow up with urologist She will need repeat urine culture during that visit  Discussed the management with the patient Total time spent 15 min

## 2021-04-21 ENCOUNTER — Other Ambulatory Visit: Payer: Self-pay

## 2021-04-21 ENCOUNTER — Encounter: Payer: Self-pay | Admitting: Obstetrics and Gynecology

## 2021-04-21 ENCOUNTER — Other Ambulatory Visit: Payer: Medicaid Other

## 2021-04-21 ENCOUNTER — Ambulatory Visit (INDEPENDENT_AMBULATORY_CARE_PROVIDER_SITE_OTHER): Payer: Medicaid Other | Admitting: Obstetrics and Gynecology

## 2021-04-21 VITALS — BP 110/71 | HR 85 | Wt 159.5 lb

## 2021-04-21 DIAGNOSIS — Z3A29 29 weeks gestation of pregnancy: Secondary | ICD-10-CM

## 2021-04-21 DIAGNOSIS — O0993 Supervision of high risk pregnancy, unspecified, third trimester: Secondary | ICD-10-CM

## 2021-04-21 DIAGNOSIS — Z23 Encounter for immunization: Secondary | ICD-10-CM

## 2021-04-21 LAB — POCT URINALYSIS DIPSTICK OB
Bilirubin, UA: NEGATIVE
Glucose, UA: NEGATIVE
Ketones, UA: NEGATIVE
Leukocytes, UA: NEGATIVE
Nitrite, UA: NEGATIVE
Spec Grav, UA: 1.02 (ref 1.010–1.025)
Urobilinogen, UA: 0.2 E.U./dL
pH, UA: 6.5 (ref 5.0–8.0)

## 2021-04-21 MED ORDER — TETANUS-DIPHTH-ACELL PERTUSSIS 5-2.5-18.5 LF-MCG/0.5 IM SUSY
0.5000 mL | PREFILLED_SYRINGE | Freq: Once | INTRAMUSCULAR | Status: AC
  Administered 2021-04-21: 0.5 mL via INTRAMUSCULAR

## 2021-04-21 NOTE — Progress Notes (Signed)
ROB: Doing much better.  Continues to receive IV Rocephin daily and will switch to p.o. next week.  Eating well without nausea and vomiting.  Reports daily fetal movement.  1 hour GCT today.  Left lung has reexpanded and minimal effusion left.  Continue to be followed by pulmonology and ID.

## 2021-04-21 NOTE — Progress Notes (Signed)
   OB-Pt present for routine prenatal care. Pt stated fetal movement present; braxton hick contractions present; no vaginal bleeding and an increase in vaginal discharge. Pt c/o lower abd/vaginal pain and pressure.

## 2021-04-21 NOTE — Patient Instructions (Signed)
Influenza (Flu) Vaccine (Inactivated or Recombinant): What You Need to Know 1. Why get vaccinated? Influenza vaccine can prevent influenza (flu). Flu is a contagious disease that spreads around the [place] every year, usually between October and May. Anyone can get the flu, but it is more dangerous for some people. Infants and young children, people 21 years and older, pregnant people, and people with certain health conditions or a weakenedimmune system are at greatest risk of flu complications. Pneumonia, bronchitis, sinus infections, and ear infections are examples of flu-related complications. If you have a medical condition, such as heartdisease, cancer, or diabetes, flu can make it worse. Flu can cause fever and chills, sore throat, muscle aches, fatigue, cough, headache, and runny or stuffy nose. Some people may have vomiting and diarrhea,though this is more common in children than adults. In an average year, thousands of people in the [place] die from flu, and many more are hospitalized. Flu vaccine prevents millions of illnessesand flu-related visits to the doctor each year. 2. Influenza vaccines CDC recommends everyone 6 months and older get vaccinated every flu season. Children 6 months through 14 years of age may need 2 doses during a single flu season. Everyone else needs only 1 dose each flu season. It takes about 2 weeks for protection to develop after vaccination. There are many flu viruses, and they are always changing. Each year a new flu vaccine is made to protect against the influenza viruses believed to be likely to cause disease in the upcoming flu season. Even when the vaccine doesn'texactly match these viruses, it may still provide some protection. Influenza vaccine does not cause flu. Influenza vaccine may be given at the same time as other vaccines. 3. Talk with your health care provider Tell your vaccination provider if the person getting the vaccine: Has had an  allergic reaction after a previous dose of influenza vaccine, or has any severe, life-threatening allergies Has ever had Guillain-Barr Syndrome (also called "GBS") In some cases, your health care provider may decide to postpone influenzavaccination until a future visit. Influenza vaccine can be administered at any time during pregnancy. People who are or will be pregnant during influenza season should receive inactivatedinfluenza vaccine. People with minor illnesses, such as a cold, may be vaccinated. People who are moderately or severely ill should usually wait until they recover beforegetting influenza vaccine. Your health care provider can give you more information. 4. Risks of a vaccine reaction Soreness, redness, and swelling where the shot is given, fever, muscle aches, and headache can happen after influenza vaccination. There may be a very small increased risk of Guillain-Barr Syndrome (GBS) after inactivated influenza vaccine (the flu shot). Young children who get the flu shot along with pneumococcal vaccine (PCV13) and/or DTaP vaccine at the same time might be slightly more likely to have a seizure caused by fever. Tell your health care provider if a child who isgetting flu vaccine has ever had a seizure. People sometimes faint after medical procedures, including vaccination. Tellyour provider if you feel dizzy or have vision changes or ringing in the ears. As with any medicine, there is a very remote chance of a vaccine causing asevere allergic reaction, other serious injury, or death. 5. What if there is a serious problem? An allergic reaction could occur after the vaccinated person leaves the clinic. If you see signs of a severe allergic reaction (hives, swelling of the face and throat, difficulty breathing, a fast heartbeat, dizziness, or weakness), call 9-1-1 and get the  person to the nearest hospital. For other signs that concern you, call your health care provider. Adverse reactions  should be reported to the Vaccine Adverse Event Reporting System (VAERS). Your health care provider will usually file this report, or you can do it yourself. Visit the VAERS website at www.vaers.SamedayNews.es or call 218-748-4872. VAERS is only for reporting reactions, and VAERS staff members do not give medical advice. 6. The National Vaccine Injury Compensation Program The [employer] Vaccine Injury Compensation Program (VICP) is a federal program that was created to compensate people who may have been injured by certain vaccines. Claims regarding alleged injury or death due to vaccination have a time limit for filing, which may be as short as two years. Visit the VICP website at GoldCloset.com.ee or call 475-617-1150 to learn about the program and about filing a claim. 7. How can I learn more? Ask your health care provider. Call your local or state health department. Visit the website of the Food and Drug Administration (FDA) for vaccine package inserts and additional information at TraderRating.uy. Contact the Centers for Disease Control and Prevention (CDC): Call (762)715-0504 (1-800-CDC-INFO) or Visit CDC's website at https://gibson.com/. Vaccine Information Statement Inactivated Influenza Vaccine (04/01/2020) This information is not intended to replace advice given to you by your health care provider. Make sure you discuss any questions you have with your healthcare provider. Document Revised: 05/19/2020 Document Reviewed: 05/19/2020 Elsevier Patient Education  [place]. Tdap (Tetanus, Diphtheria, Pertussis) Vaccine: What You Need to Know 1. Why get vaccinated? Tdap vaccine can prevent tetanus, diphtheria, and pertussis. Diphtheria and pertussis spread from person to person. Tetanus enters the body through cuts or wounds. TETANUS (T) causes painful stiffening of the muscles. Tetanus can lead to serious health problems, including being unable to  open the mouth, having trouble swallowing and breathing, or death. DIPHTHERIA (D) can lead to difficulty breathing, heart failure, paralysis, or death. PERTUSSIS (aP), also known as "whooping cough," can cause uncontrollable, violent coughing that makes it hard to breathe, eat, or drink. Pertussis can be extremely serious especially in babies and young children, causing pneumonia, convulsions, brain damage, or death. In teens and adults, it can cause weight loss, loss of bladder control, passing out, and rib fractures from severe coughing. 2. Tdap vaccine Tdap is only for children 7 years and older, adolescents, and adults.  Adolescents should receive a single dose of Tdap, preferably at age 18 or 73 years. Pregnant people should get a dose of Tdap during every pregnancy, preferably during the early part of the third trimester, to help protect the newborn from pertussis. Infants are most at risk for severe, life-threatening complications frompertussis. Adults who have never received Tdap should get a dose of Tdap. Also, adults should receive a booster dose of either Tdap or Td (a different vaccine that protects against tetanus and diphtheria but not pertussis) every 10 years, or after 5 years in the case of a severe or dirty wound or burn. Tdap may be given at the same time as other vaccines. 3. Talk with your health care provider Tell your vaccine provider if the person getting the vaccine: Has had an allergic reaction after a previous dose of any vaccine that protects against tetanus, diphtheria, or pertussis, or has any severe, life-threatening allergies Has had a coma, decreased level of consciousness, or prolonged seizures within 7 days after a previous dose of any pertussis vaccine (DTP, DTaP, or Tdap) Has seizures or another nervous system problem Has ever had Guillain-Barr Syndrome (also  called "GBS") Has had severe pain or swelling after a previous dose of any vaccine that protects against  tetanus or diphtheria In some cases, your health care provider may decide to postpone Tdapvaccination until a future visit. People with minor illnesses, such as a cold, may be vaccinated. People who are moderately or severely ill should usually wait until they recover beforegetting Tdap vaccine.  Your health care provider can give you more information. 4. Risks of a vaccine reaction Pain, redness, or swelling where the shot was given, mild fever, headache, feeling tired, and nausea, vomiting, diarrhea, or stomachache sometimes happen after Tdap vaccination. People sometimes faint after medical procedures, including vaccination. Tellyour provider if you feel dizzy or have vision changes or ringing in the ears.  As with any medicine, there is a very remote chance of a vaccine causing asevere allergic reaction, other serious injury, or death. 5. What if there is a serious problem? An allergic reaction could occur after the vaccinated person leaves the clinic. If you see signs of a severe allergic reaction (hives, swelling of the face and throat, difficulty breathing, a fast heartbeat, dizziness, or weakness), call 9-1-1and get the person to the nearest hospital. For other signs that concern you, call your health care provider.  Adverse reactions should be reported to the Vaccine Adverse Event Reporting System (VAERS). Your health care provider will usually file this report, or you can do it yourself. Visit the VAERS website at www.vaers.SamedayNews.es or call (618)015-8913. VAERS is only for reporting reactions, and VAERS staff members do not give medical advice. 6. The National Vaccine Injury Compensation Program The [employer] Vaccine Injury Compensation Program (VICP) is a federal program that was created to compensate people who may have been injured by certain vaccines. Claims regarding alleged injury or death due to vaccination have a time limit for filing, which may be as short as two years. Visit the VICP  website at GoldCloset.com.ee or call (424)793-4860to learn about the program and about filing a claim. 7. How can I learn more? Ask your health care provider. Call your local or state health department. Visit the website of the Food and Drug Administration (FDA) for vaccine package inserts and additional information at TraderRating.uy. Contact the Centers for Disease Control and Prevention (CDC): Call (726)175-4933 (1-800-CDC-INFO) or Visit CDC's website at http://hunter.com/. Vaccine Information Statement Tdap (Tetanus, Diphtheria, Pertussis) Vaccine(04/01/2020) This information is not intended to replace advice given to you by your health care provider. Make sure you discuss any questions you have with your healthcare provider. Document Revised: 04/27/2020 Document Reviewed: 04/27/2020 Elsevier Patient Education  2022 [place]. Common Medications Safe in Pregnancy  Acne:      Constipation:  Benzoyl Peroxide     Colace  Clindamycin      Dulcolax Suppository  Topica Erythromycin     Fibercon  Salicylic Acid      Metamucil         Miralax AVOID:        Senakot   Accutane    Cough:  Retin-A       Cough Drops  Tetracycline      Phenergan w/ Codeine if Rx  Minocycline      Robitussin (Plain & DM)  Antibiotics:     Crabs/Lice:  Ceclor       RID  Cephalosporins    AVOID:  E-Mycins      Kwell  Keflex  Macrobid/Macrodantin   Diarrhea:  Penicillin      Kao-Pectate  Zithromax  Imodium AD         PUSH FLUIDS AVOID:       Cipro     Fever:  Tetracycline      Tylenol (Regular or Extra  Minocycline       Strength)  Levaquin      Extra Strength-Do not          Exceed 8 tabs/24 hrs Caffeine:        '200mg'$ /day (equiv. To 1 cup of coffee or  approx. 3 12 oz sodas)         Gas: Cold/Hayfever:       Gas-X  Benadryl      Mylicon  Claritin       Phazyme  **Claritin-D        Chlor-Trimeton    Headaches:  Dimetapp      ASA-Free  Excedrin  Drixoral-Non-Drowsy     Cold Compress  Mucinex (Guaifenasin)     Tylenol (Regular or Extra  Sudafed/Sudafed-12 Hour     Strength)  **Sudafed PE Pseudoephedrine   Tylenol Cold & Sinus     Vicks Vapor Rub  Zyrtec  **AVOID if Problems With Blood Pressure         Heartburn: Avoid lying down for at least 1 hour after meals  Aciphex      Maalox     Rash:  Milk of Magnesia     Benadryl    Mylanta       1% Hydrocortisone Cream  Pepcid  Pepcid Complete   Sleep Aids:  Prevacid      Ambien   Prilosec       Benadryl  Rolaids       Chamomile Tea  Tums (Limit 4/day)     Unisom         Tylenol PM         Warm milk-add vanilla or  Hemorrhoids:       Sugar for taste  Anusol/Anusol H.C.  (RX: Analapram 2.5%)  Sugar Substitutes:  Hydrocortisone OTC     Ok in moderation  Preparation H      Tucks        Vaseline lotion applied to tissue with wiping    Herpes:     Throat:  Acyclovir      Oragel  Famvir  Valtrex     Vaccines:         Flu Shot Leg Cramps:       *Gardasil  Benadryl      Hepatitis A         Hepatitis B Nasal Spray:       Pneumovax  Saline Nasal Spray     Polio Booster         Tetanus Nausea:       Tuberculosis test or PPD  Vitamin B6 25 mg TID   AVOID:    Dramamine      *Gardasil  Emetrol       Live Poliovirus  Ginger Root 250 mg QID    MMR (measles, mumps &  High Complex Carbs @ Bedtime    rebella)  Sea Bands-Accupressure    Varicella (Chickenpox)  Unisom 1/2 tab TID     *No known complications           If received before Pain:         Known pregnancy;   Darvocet       Resume series after  Lortab        Delivery  Percocet  Yeast:   Tramadol      Femstat  Tylenol 3      Gyne-lotrimin  Ultram       Monistat  Vicodin           MISC:         All Sunscreens           Hair Coloring/highlights          Insect Repellant's          (Including DEET)         Mystic Tans

## 2021-04-22 LAB — GLUCOSE, 1 HOUR GESTATIONAL: Gestational Diabetes Screen: 52 mg/dL — ABNORMAL LOW (ref 65–139)

## 2021-04-22 LAB — RPR: RPR Ser Ql: NONREACTIVE

## 2021-04-22 LAB — CBC
Hematocrit: 35.4 % (ref 34.0–46.6)
Hemoglobin: 11.9 g/dL (ref 11.1–15.9)
MCH: 29.9 pg (ref 26.6–33.0)
MCHC: 33.6 g/dL (ref 31.5–35.7)
MCV: 89 fL (ref 79–97)
Platelets: 327 10*3/uL (ref 150–450)
RBC: 3.98 x10E6/uL (ref 3.77–5.28)
RDW: 13.4 % (ref 11.7–15.4)
WBC: 9.2 10*3/uL (ref 3.4–10.8)

## 2021-05-03 NOTE — Progress Notes (Incomplete)
05/03/21 10:09 AM   Laura Sosa 06-06-1986 [id number]  Referring provider:  [facility] [address] [place],  [place] [address] No chief complaint on file.    HPI: Laura Sosa is a 35 y.o.female who is pregnant [redacted]w[redacted]d with a personal history of left renal stone associated with urinary infection, who presents today for follow-up with RUS.   She is s/p staged left ureteroscopy with laser lithotripsy and stent exchange on 03/06/2021 for management 7 mm left renal stone.   She developed a left renal hematoma postoperatively. Treatment was complicated by hyperemesis secondary to her intolerance of p.o . antibitoics. She received 1 g IM ceftriaxone for a total of 4 consecutive doses.   RUS on 04/18/2021 revealed a slightly limited evaluation of the left kidney with poor visualization of known inflammatory collections within the superolateral aspect. Inflammatory collection within the medial aspect of the left kidney appears stable since prior examination and is slightly decreased in size since prior MRI examination and demonstrates no significant fluid component. No new collections identified. No hydronephrosis.    PMH: Past Medical History:  Diagnosis Date   Bilateral kidney stones    Headache    History of kidney stones    Migraine    Pyelonephritis 04/17/2016    Surgical History: Past Surgical History:  Procedure Laterality Date   COLPOSCOPY     CYSTOSCOPY WITH STENT PLACEMENT Left 02/26/2021   Procedure: CYSTOSCOPY WITH STENT PLACEMENT;  Surgeon: Crista Elliot, MD;  Location: [facility] OR[facility];  Service: Urology;  Laterality: Left;   CYSTOSCOPY/RETROGRADE/URETEROSCOPY  03/15/2017   Procedure: CYSTOSCOPY/RETROGRADE/URETEROSCOPY;  Surgeon: Hildred Laser, MD;  Location: [facility];  Service: Urology;;   CYSTOSCOPY/URETEROSCOPY/HOLMIUM LASER/STENT PLACEMENT Left 03/06/2021   Procedure: CYSTOSCOPY/URETEROSCOPY/HOLMIUM LASER/STENT PLACEMENT;  Surgeon: Vanna Scotland,  MD;  Location: [facility] ORS;  Service: Urology;  Laterality: Left;   LEEP      Home Medications:  Allergies as of 05/04/2021   No Known Allergies      Medication List        Accurate as of May 03, 2021 10:09 AM. If you have any questions, ask your nurse or doctor.          acetaminophen 325 MG tablet Commonly known as: TYLENOL Take 2 tablets (650 mg total) by mouth every 4 (four) hours as needed (for pain scale < 4  OR  temperature  >/=  100.5 F).   cyclobenzaprine 10 MG tablet Commonly known as: FLEXERIL Take 1 tablet (10 mg total) by mouth 3 (three) times daily as needed for muscle spasms.   hydrOXYzine 25 MG tablet Commonly known as: ATARAX/VISTARIL Take 1 tablet (25 mg total) by mouth 3 (three) times daily as needed for anxiety.   KEFLEX PO Take by mouth.   multivitamin-prenatal 27-0.8 MG Tabs tablet Take 1 tablet by mouth daily at 12 noon.        Allergies: No Known Allergies  Family History: Family History  Problem Relation Age of Onset   Hypertension Mother    Diabetes Mother    Diabetes Father    Hypertension Father    Emphysema Maternal Grandmother    Lung cancer Maternal Grandmother    Asthma Daughter    Hypertension Paternal Grandmother    Heart defect Half-Sister        "boot shaped heart"   Kidney cancer Neg Hx    Bladder Cancer Neg Hx    Breast cancer Neg Hx    Ovarian cancer Neg  Hx    Colon cancer Neg Hx     Social History:  reports that she has never smoked. She has never used smokeless tobacco. She reports that she does not drink alcohol and does not use drugs.   Physical Exam: LMP  (LMP Unknown)   Constitutional:  Alert and oriented, No acute distress. HEENT: [place] AT, moist mucus membranes.  Trachea midline, no masses. Cardiovascular: No clubbing, cyanosis, or edema. Respiratory: Normal respiratory effort, no increased work of breathing. GI: Abdomen is soft, nontender, nondistended, no abdominal masses GU: No CVA  tenderness Lymph: No cervical or inguinal lymphadenopathy. Skin: No rashes, bruises or suspicious lesions. Neurologic: Grossly intact, no focal deficits, moving all 4 extremities. Psychiatric: Normal mood and affect.  Laboratory Data:  Lab Results  Component Value Date   CREATININE 0.57 04/03/2021    No results found for: PSA  Lab Results  Component Value Date   TESTOSTERONE 15 06/21/2017    No results found for: HGBA1C  Urinalysis   Pertinent Imaging: CLINICAL DATA:  Left renal abscess   EXAM: RENAL / URINARY TRACT ULTRASOUND COMPLETE   COMPARISON:  04/02/2021, MRI 03/24/2021   FINDINGS: Right Kidney:   Renal measurements: 11.7 x 4.7 x 5.9 cm = volume: 170 mL. Echogenicity within normal limits. No mass or hydronephrosis visualized.   Left Kidney:   Renal measurements: 10.9 x 5.9 x 4.8 cm = volume: 160 mL. Renal cortical thickness is preserved and cortical echogenicity is normal. There is no hydronephrosis. The complex, heterogeneous solid mass within the medial left kidney measures 2.3 x 2.5 by 2.3 cm, stable since prior examination by my measurement and slightly decreased in size since remote MRI examination. The additional lesions within the superolateral aspect of the left kidney are not well visualized on this examination, partially visualized on image # 28 and # 32. These also, while not optimally visualized, appears slightly decreased in size since prior examination. No perinephric fluid collections are identified.   Bladder:   Appears normal for degree of bladder distention. Bilateral ureteral jets are identified.   Other:   None.   IMPRESSION: Slightly limited evaluation of the left kidney with poor visualization of known inflammatory collections within the superolateral aspect. Inflammatory collection within the medial aspect of the left kidney appears stable since prior examination and is slightly decreased in size since prior MRI  examination and demonstrates no significant fluid component. No new collections identified.   No hydronephrosis.     Electronically Signed   By: Helyn Numbers M.D.   On: 04/18/2021 22:52   Assessment & Plan:     No follow-ups on file.  I,Kailey Littlejohn,acting as a Neurosurgeon for Vanna Scotland, MD.,have documented all relevant documentation on the behalf of Vanna Scotland, MD,as directed by  Vanna Scotland, MD while in the presence of Vanna Scotland, MD.   [facility] [address] [place], [place] [address] [phone]

## 2021-05-04 ENCOUNTER — Encounter: Payer: Self-pay | Admitting: Urology

## 2021-05-05 ENCOUNTER — Encounter: Payer: Self-pay | Admitting: Urology

## 2021-05-12 ENCOUNTER — Ambulatory Visit (INDEPENDENT_AMBULATORY_CARE_PROVIDER_SITE_OTHER): Payer: Medicaid Other | Admitting: Obstetrics and Gynecology

## 2021-05-12 ENCOUNTER — Encounter: Payer: Self-pay | Admitting: Obstetrics and Gynecology

## 2021-05-12 ENCOUNTER — Other Ambulatory Visit: Payer: Self-pay

## 2021-05-12 VITALS — BP 118/71 | HR 88 | Wt 164.5 lb

## 2021-05-12 DIAGNOSIS — Z2821 Immunization not carried out because of patient refusal: Secondary | ICD-10-CM

## 2021-05-12 DIAGNOSIS — N151 Renal and perinephric abscess: Secondary | ICD-10-CM

## 2021-05-12 DIAGNOSIS — O0993 Supervision of high risk pregnancy, unspecified, third trimester: Secondary | ICD-10-CM

## 2021-05-12 DIAGNOSIS — O26843 Uterine size-date discrepancy, third trimester: Secondary | ICD-10-CM

## 2021-05-12 DIAGNOSIS — Z3403 Encounter for supervision of normal first pregnancy, third trimester: Secondary | ICD-10-CM

## 2021-05-12 DIAGNOSIS — Z3A32 32 weeks gestation of pregnancy: Secondary | ICD-10-CM

## 2021-05-12 DIAGNOSIS — Z87442 Personal history of urinary calculi: Secondary | ICD-10-CM

## 2021-05-12 LAB — POCT URINALYSIS DIPSTICK OB
Bilirubin, UA: NEGATIVE
Glucose, UA: NEGATIVE
Ketones, UA: NEGATIVE
Nitrite, UA: NEGATIVE
Spec Grav, UA: 1.015 (ref 1.010–1.025)
Urobilinogen, UA: 0.2 E.U./dL
pH, UA: 7 (ref 5.0–8.0)

## 2021-05-12 NOTE — Progress Notes (Signed)
ROB: Patient notes she is doing well, no major issues. Significant size less than dates. Finally noting some weight gain over past 2 visits however still with negative TWG for pregnancy. Will order ultrasound for growth.  Will check urine cultures monthly due to h/o renal abscess and stones. Has decided on IUD for contraception. Declines flu vaccine. RTC in 2 weeks.

## 2021-05-12 NOTE — Progress Notes (Signed)
ROB: She is doing well, no new concerns today. Patient said she had BTC signed in the hospital. TDAP done at last appointment.

## 2021-05-16 LAB — ACID FAST CULTURE WITH REFLEXED SENSITIVITIES (MYCOBACTERIA): Acid Fast Culture: NEGATIVE

## 2021-05-25 ENCOUNTER — Encounter: Payer: Medicaid Other | Admitting: Obstetrics and Gynecology

## 2021-05-26 ENCOUNTER — Ambulatory Visit (INDEPENDENT_AMBULATORY_CARE_PROVIDER_SITE_OTHER): Payer: Medicaid Other | Admitting: Obstetrics and Gynecology

## 2021-05-26 ENCOUNTER — Encounter: Payer: Self-pay | Admitting: Obstetrics and Gynecology

## 2021-05-26 ENCOUNTER — Ambulatory Visit (INDEPENDENT_AMBULATORY_CARE_PROVIDER_SITE_OTHER): Payer: Medicaid Other

## 2021-05-26 ENCOUNTER — Other Ambulatory Visit: Payer: Self-pay

## 2021-05-26 ENCOUNTER — Telehealth: Payer: Self-pay

## 2021-05-26 VITALS — BP 128/75 | HR 71 | Wt 169.3 lb

## 2021-05-26 DIAGNOSIS — O26843 Uterine size-date discrepancy, third trimester: Secondary | ICD-10-CM

## 2021-05-26 DIAGNOSIS — O0993 Supervision of high risk pregnancy, unspecified, third trimester: Secondary | ICD-10-CM | POA: Diagnosis not present

## 2021-05-26 DIAGNOSIS — N898 Other specified noninflammatory disorders of vagina: Secondary | ICD-10-CM

## 2021-05-26 DIAGNOSIS — O26893 Other specified pregnancy related conditions, third trimester: Secondary | ICD-10-CM

## 2021-05-26 NOTE — Telephone Encounter (Signed)
Patient called said she lost her mucous plug, has pelvic pressure, and when she got out of bed she noticed that her underwear was wet and it was not urine. Advised by Dr. Valentino Saxon to come in for scheduled u/s appointment and she would be evaluated by her. Sam called patient to inform her to just come in for u/s. She denied bleeding or contractions. She verbalized understanding.

## 2021-05-26 NOTE — Progress Notes (Signed)
Problem OB: Patient presented for ultrasound only for growth, however noted that she had saturated underwear earlier today and was concerned for leaking fluid.  Nitrazine and fern test negative, moderate mucoid discharge present. No fluid noted in vaginal vault. Given reassurance. Growth at 30%ile today.  RTC in 2 weeks.

## 2021-05-26 NOTE — Progress Notes (Signed)
ROB: She has some pelvic pressure, lost her mucous plug and has had some leakage. She had her u/s done today.

## 2021-05-26 NOTE — Patient Instructions (Signed)

## 2021-05-30 ENCOUNTER — Encounter: Payer: Medicaid Other | Admitting: Obstetrics and Gynecology

## 2021-06-08 ENCOUNTER — Encounter: Payer: Self-pay | Admitting: Obstetrics and Gynecology

## 2021-06-08 ENCOUNTER — Observation Stay
Admission: EM | Admit: 2021-06-08 | Discharge: 2021-06-08 | Disposition: A | Discharge status: Home / Self Care | Payer: Medicaid Other | Attending: Obstetrics and Gynecology | Admitting: Obstetrics and Gynecology

## 2021-06-08 ENCOUNTER — Other Ambulatory Visit: Payer: Self-pay

## 2021-06-08 DIAGNOSIS — O26893 Other specified pregnancy related conditions, third trimester: Secondary | ICD-10-CM | POA: Diagnosis not present

## 2021-06-08 DIAGNOSIS — Z3A36 36 weeks gestation of pregnancy: Secondary | ICD-10-CM | POA: Insufficient documentation

## 2021-06-08 DIAGNOSIS — R102 Pelvic and perineal pain: Secondary | ICD-10-CM

## 2021-06-08 DIAGNOSIS — N899 Noninflammatory disorder of vagina, unspecified: Secondary | ICD-10-CM | POA: Diagnosis not present

## 2021-06-08 DIAGNOSIS — N898 Other specified noninflammatory disorders of vagina: Secondary | ICD-10-CM

## 2021-06-08 DIAGNOSIS — Z3490 Encounter for supervision of normal pregnancy, unspecified, unspecified trimester: Secondary | ICD-10-CM

## 2021-06-08 DIAGNOSIS — O26899 Other specified pregnancy related conditions, unspecified trimester: Secondary | ICD-10-CM

## 2021-06-08 LAB — RUPTURE OF MEMBRANE (ROM)PLUS: Rom Plus: NEGATIVE

## 2021-06-08 NOTE — Final Progress Note (Signed)
L&D OB Triage Note  [place] R Brandner is a 35 y.o. G20P2002 female at [redacted]w[redacted]d, EDD Estimated Date of Delivery: 07/05/21 who presented to triage for complaints of possible LOF and vaginal pressure. Denies vaginal bleeding, contractions. She was evaluated by the nurses with no significant findings for ruptured membranes. Vital signs stable. An NST was performed and has been reviewed by MD.    Physical Exam:  Blood pressure 123/71, pulse 80. Cervical exam: Dilation: 2 Effacement (%): 50 Station: -2 Exam by:: Katrina Stack RN  NST INTERPRETATION: Indications: rule out uterine contractions  Mode: External Baseline Rate (A): 130 bpm Variability: Moderate Accelerations: 15 x 15 Decelerations: None     Contraction Frequency (min): 6-7  Impression: reactive   Labs:  Results for orders placed or performed during the hospital encounter of 06/08/21  ROM Plus ([facility] only)  Result Value Ref Range   Rom Plus NEGATIVE     Plan: NST performed was reviewed and was found to be reactive. She was discharged home with bleeding/preterm labor precautions.  Continue routine prenatal care. Follow up with OB/GYN as previously scheduled.     Hildred Laser, MD [facility]

## 2021-06-08 NOTE — OB Triage Note (Signed)
G3P2 reports with pressure and LOF. Pt reports some vomiting. Pt reports no bleeding. Pt stated that the LOF started arouud 2-3pm.

## 2021-06-09 ENCOUNTER — Encounter: Payer: Self-pay | Admitting: Obstetrics and Gynecology

## 2021-06-09 ENCOUNTER — Ambulatory Visit (INDEPENDENT_AMBULATORY_CARE_PROVIDER_SITE_OTHER): Payer: Medicaid Other | Admitting: Obstetrics and Gynecology

## 2021-06-09 VITALS — BP 124/87 | HR 96 | Wt 169.4 lb

## 2021-06-09 DIAGNOSIS — O26893 Other specified pregnancy related conditions, third trimester: Secondary | ICD-10-CM | POA: Diagnosis not present

## 2021-06-09 DIAGNOSIS — O0993 Supervision of high risk pregnancy, unspecified, third trimester: Secondary | ICD-10-CM

## 2021-06-09 DIAGNOSIS — N858 Other specified noninflammatory disorders of uterus: Secondary | ICD-10-CM

## 2021-06-09 DIAGNOSIS — Z3A36 36 weeks gestation of pregnancy: Secondary | ICD-10-CM

## 2021-06-09 DIAGNOSIS — N898 Other specified noninflammatory disorders of vagina: Secondary | ICD-10-CM

## 2021-06-09 DIAGNOSIS — Z3483 Encounter for supervision of other normal pregnancy, third trimester: Secondary | ICD-10-CM

## 2021-06-09 LAB — POCT URINALYSIS DIPSTICK OB
Bilirubin, UA: NEGATIVE
Glucose, UA: NEGATIVE
Ketones, UA: NEGATIVE
Nitrite, UA: NEGATIVE
Spec Grav, UA: 1.01 (ref 1.010–1.025)
Urobilinogen, UA: 0.2 E.U./dL
pH, UA: 7.5 (ref 5.0–8.0)

## 2021-06-09 NOTE — Progress Notes (Signed)
ROB: Patient seen in triage yesterday due to increased vaginal discharge, concerned for LOF however ROM+ was neg. Was noted  36 week labs performed today. Desired cervical check due to having some irregular ctx yesterday, unchanged from prior exam in triage (2/50/-3).   IUD desired for contraception. Desires epidural in labor. Plans to breastfeed. RTC in 1 weeks, labor precautions given.

## 2021-06-09 NOTE — Progress Notes (Signed)
ROB:She has been having pain intermittently today. She was evaluated last night at ED.

## 2021-06-10 NOTE — Patient Instructions (Signed)
Signs and Symptoms of Labor Labor is the body's natural process of moving the baby and the placenta out of the uterus. The process of labor usually starts when the baby is full-term, between 37 and 40 weeks of pregnancy. Signs and symptoms that you are close to going into labor As your body prepares for labor and the birth of your baby, you may notice the following symptoms in the weeks and days before true labor starts: Passing a small amount of thick, bloody mucus from your vagina. This is called normal bloody show or losing your mucus plug. This may happen more than a week before labor begins, or right before labor begins, as the opening of the cervix starts to widen (dilate). For some women, the entire mucus plug passes at once. For others, pieces of the mucus plug may gradually pass over several days. Your baby moving (dropping) lower in your pelvis to get into position for birth (lightening). When this happens, you may feel more pressure on your bladder and pelvic bone and less pressure on your ribs. This may make it easier to breathe. It may also cause you to need to urinate more often and have problems with bowel movements. Having "practice contractions," also called Braxton Hicks contractions or false labor. These occur at irregular (unevenly spaced) intervals that are more than 10 minutes apart. False labor contractions are common after exercise or sexual activity. They will stop if you change position, rest, or drink fluids. These contractions are usually mild and do not get stronger over time. They may feel like: A backache or back pain. Mild cramps, similar to menstrual cramps. Tightening or pressure in your abdomen. Other early symptoms include: Nausea or loss of appetite. Diarrhea. Having a sudden burst of energy, or feeling very tired. Mood changes. Having trouble sleeping. Signs and symptoms that labor has begun Signs that you are in labor may include: Having contractions that come  at regular (evenly spaced) intervals and increase in intensity. This may feel like more intense tightening or pressure in your abdomen that moves to your back. Contractions may also feel like rhythmic pain in your upper thighs or back that comes and goes at regular intervals. For first-time mothers, this change in intensity of contractions often occurs at a more gradual pace. Women who have given birth before may notice a more rapid progression of contraction changes. Feeling pressure in the vaginal area. Your water breaking (rupture of membranes). This is when the sac of fluid that surrounds your baby breaks. Fluid leaking from your vagina may be clear or blood-tinged. Labor usually starts within 24 hours of your water breaking, but it may take longer to begin. Some women may feel a sudden gush of fluid. Others notice that their underwear repeatedly becomes damp. Follow these instructions at home:  When labor starts, or if your water breaks, call your health care provider or nurse care line. Based on your situation, they will determine when you should go in for an exam. During early labor, you may be able to rest and manage symptoms at home. Some strategies to try at home include: Breathing and relaxation techniques. Taking a warm bath or shower. Listening to music. Using a heating pad on the lower back for pain. If you are directed to use heat: Place a towel between your skin and the heat source. Leave the heat on for 20-30 minutes. Remove the heat if your skin turns bright red. This is especially important if you are unable to   feel pain, heat, or cold. You may have a greater risk of getting burned. Contact a health care provider if: Your labor has started. Your water breaks. Get help right away if: You have painful, regular contractions that are 5 minutes apart or less. Labor starts before you are [redacted] weeks along in your pregnancy. You have a fever. You have bright red blood coming from  your vagina. You do not feel your baby moving. You have a severe headache with or without vision problems. You have severe nausea, vomiting, or diarrhea. You have chest pain or shortness of breath. These symptoms may represent a serious problem that is an emergency. Do not wait to see if the symptoms will go away. Get medical help right away. Call your local emergency services (911 in the [place] Do not drive yourself to the hospital. Summary Labor is your body's natural process of moving your baby and the placenta out of your uterus. The process of labor usually starts when your baby is full-term, between 37 and 40 weeks of pregnancy. When labor starts, or if your water breaks, call your health care provider or nurse care line. Based on your situation, they will determine when you should go in for an exam. This information is not intended to replace advice given to you by your health care provider. Make sure you discuss any questions you have with your health care provider. Document Revised: 06/04/2020 Document Reviewed: 06/04/2020 Elsevier Patient Education  2022 Elsevier Inc.  

## 2021-06-11 LAB — STREP GP B NAA: Strep Gp B NAA: NEGATIVE

## 2021-06-14 LAB — GC/CHLAMYDIA PROBE AMP
Chlamydia trachomatis, NAA: NEGATIVE
Neisseria Gonorrhoeae by PCR: NEGATIVE

## 2021-06-15 ENCOUNTER — Ambulatory Visit (INDEPENDENT_AMBULATORY_CARE_PROVIDER_SITE_OTHER): Payer: Medicaid Other | Admitting: Obstetrics and Gynecology

## 2021-06-15 ENCOUNTER — Other Ambulatory Visit: Payer: Self-pay

## 2021-06-15 VITALS — BP 97/61 | HR 102 | Wt 170.2 lb

## 2021-06-15 DIAGNOSIS — O0993 Supervision of high risk pregnancy, unspecified, third trimester: Secondary | ICD-10-CM

## 2021-06-15 DIAGNOSIS — Z3A37 37 weeks gestation of pregnancy: Secondary | ICD-10-CM

## 2021-06-15 LAB — POCT URINALYSIS DIPSTICK OB
Bilirubin, UA: NEGATIVE
Blood, UA: NEGATIVE
Glucose, UA: NEGATIVE
Ketones, UA: NEGATIVE
Leukocytes, UA: NEGATIVE
Nitrite, UA: NEGATIVE
POC,PROTEIN,UA: NEGATIVE
Spec Grav, UA: 1.01 (ref 1.010–1.025)
Urobilinogen, UA: 0.2 E.U./dL
pH, UA: 6.5 (ref 5.0–8.0)

## 2021-06-15 NOTE — Progress Notes (Signed)
ROB: No complaints.  No labor contractions yet.  Reports daily fetal movement.  Having some new onset morning sickness.  Culture results reviewed.

## 2021-06-19 NOTE — Telephone Encounter (Signed)
Called patient. She has been having back pain that radiates to her vagina that started this morning.  She denies bleeding or cramping. Advised patient to take warm bath, use a heating pad on her lower back and take some Tylenol to see if that helped. Told her if symptoms did not improve she should go to ED. Please advise.

## 2021-06-21 ENCOUNTER — Ambulatory Visit (INDEPENDENT_AMBULATORY_CARE_PROVIDER_SITE_OTHER): Payer: Medicaid Other | Admitting: Obstetrics and Gynecology

## 2021-06-21 ENCOUNTER — Other Ambulatory Visit: Payer: Self-pay

## 2021-06-21 ENCOUNTER — Encounter: Payer: Self-pay | Admitting: Obstetrics and Gynecology

## 2021-06-21 VITALS — BP 125/84 | HR 85 | Wt 171.1 lb

## 2021-06-21 DIAGNOSIS — Z3A38 38 weeks gestation of pregnancy: Secondary | ICD-10-CM

## 2021-06-21 DIAGNOSIS — Z3483 Encounter for supervision of other normal pregnancy, third trimester: Secondary | ICD-10-CM

## 2021-06-21 LAB — POCT URINALYSIS DIPSTICK OB
Bilirubin, UA: NEGATIVE
Glucose, UA: NEGATIVE
Ketones, UA: NEGATIVE
Nitrite, UA: NEGATIVE
Spec Grav, UA: 1.015 (ref 1.010–1.025)
Urobilinogen, UA: 0.2 E.U./dL
pH, UA: 7 (ref 5.0–8.0)

## 2021-06-21 NOTE — Progress Notes (Signed)
ROB: Patient noting more pelvic pressure and back pain. Desires membrane sweeping. Vigorous cervical check performed. RTC in 1 week if undelivered.

## 2021-06-21 NOTE — Progress Notes (Signed)
ROB: She is having lots of pressure and back pain. She would like her membranes swept.

## 2021-06-21 NOTE — Patient Instructions (Signed)
Signs and Symptoms of Labor Labor is the body's natural process of moving the baby and the placenta out of the uterus. The process of labor usually starts when the baby is full-term, between 37 and 40 weeks of pregnancy. Signs and symptoms that you are close to going into labor As your body prepares for labor and the birth of your baby, you may notice the following symptoms in the weeks and days before true labor starts: Passing a small amount of thick, bloody mucus from your vagina. This is called normal bloody show or losing your mucus plug. This may happen more than a week before labor begins, or right before labor begins, as the opening of the cervix starts to widen (dilate). For some women, the entire mucus plug passes at once. For others, pieces of the mucus plug may gradually pass over several days. Your baby moving (dropping) lower in your pelvis to get into position for birth (lightening). When this happens, you may feel more pressure on your bladder and pelvic bone and less pressure on your ribs. This may make it easier to breathe. It may also cause you to need to urinate more often and have problems with bowel movements. Having "practice contractions," also called Braxton Hicks contractions or false labor. These occur at irregular (unevenly spaced) intervals that are more than 10 minutes apart. False labor contractions are common after exercise or sexual activity. They will stop if you change position, rest, or drink fluids. These contractions are usually mild and do not get stronger over time. They may feel like: A backache or back pain. Mild cramps, similar to menstrual cramps. Tightening or pressure in your abdomen. Other early symptoms include: Nausea or loss of appetite. Diarrhea. Having a sudden burst of energy, or feeling very tired. Mood changes. Having trouble sleeping. Signs and symptoms that labor has begun Signs that you are in labor may include: Having contractions that come  at regular (evenly spaced) intervals and increase in intensity. This may feel like more intense tightening or pressure in your abdomen that moves to your back. Contractions may also feel like rhythmic pain in your upper thighs or back that comes and goes at regular intervals. For first-time mothers, this change in intensity of contractions often occurs at a more gradual pace. Women who have given birth before may notice a more rapid progression of contraction changes. Feeling pressure in the vaginal area. Your water breaking (rupture of membranes). This is when the sac of fluid that surrounds your baby breaks. Fluid leaking from your vagina may be clear or blood-tinged. Labor usually starts within 24 hours of your water breaking, but it may take longer to begin. Some women may feel a sudden gush of fluid. Others notice that their underwear repeatedly becomes damp. Follow these instructions at home:  When labor starts, or if your water breaks, call your health care provider or nurse care line. Based on your situation, they will determine when you should go in for an exam. During early labor, you may be able to rest and manage symptoms at home. Some strategies to try at home include: Breathing and relaxation techniques. Taking a warm bath or shower. Listening to music. Using a heating pad on the lower back for pain. If you are directed to use heat: Place a towel between your skin and the heat source. Leave the heat on for 20-30 minutes. Remove the heat if your skin turns bright red. This is especially important if you are unable to   feel pain, heat, or cold. You may have a greater risk of getting burned. Contact a health care provider if: Your labor has started. Your water breaks. Get help right away if: You have painful, regular contractions that are 5 minutes apart or less. Labor starts before you are [redacted] weeks along in your pregnancy. You have a fever. You have bright red blood coming from  your vagina. You do not feel your baby moving. You have a severe headache with or without vision problems. You have severe nausea, vomiting, or diarrhea. You have chest pain or shortness of breath. These symptoms may represent a serious problem that is an emergency. Do not wait to see if the symptoms will go away. Get medical help right away. Call your local emergency services (911 in the [place] Do not drive yourself to the hospital. Summary Labor is your body's natural process of moving your baby and the placenta out of your uterus. The process of labor usually starts when your baby is full-term, between 37 and 40 weeks of pregnancy. When labor starts, or if your water breaks, call your health care provider or nurse care line. Based on your situation, they will determine when you should go in for an exam. This information is not intended to replace advice given to you by your health care provider. Make sure you discuss any questions you have with your health care provider. Document Revised: 06/04/2020 Document Reviewed: 06/04/2020 Elsevier Patient Education  2022 Elsevier Inc.  

## 2021-06-28 ENCOUNTER — Encounter: Payer: Self-pay | Admitting: Obstetrics and Gynecology

## 2021-06-28 ENCOUNTER — Other Ambulatory Visit: Payer: Self-pay

## 2021-06-28 ENCOUNTER — Ambulatory Visit (INDEPENDENT_AMBULATORY_CARE_PROVIDER_SITE_OTHER): Payer: Medicaid Other | Admitting: Obstetrics and Gynecology

## 2021-06-28 VITALS — BP 126/86 | HR 104 | Wt 172.7 lb

## 2021-06-28 DIAGNOSIS — Z3483 Encounter for supervision of other normal pregnancy, third trimester: Secondary | ICD-10-CM

## 2021-06-28 DIAGNOSIS — Z3A39 39 weeks gestation of pregnancy: Secondary | ICD-10-CM

## 2021-06-28 LAB — POCT URINALYSIS DIPSTICK OB
Bilirubin, UA: NEGATIVE
Blood, UA: NEGATIVE
Glucose, UA: NEGATIVE
Ketones, UA: NEGATIVE
Leukocytes, UA: NEGATIVE
Nitrite, UA: NEGATIVE
Spec Grav, UA: 1.01 (ref 1.010–1.025)
Urobilinogen, UA: 0.2 E.U./dL
pH, UA: 7 (ref 5.0–8.0)

## 2021-06-28 NOTE — Progress Notes (Signed)
ROB: Irregular contractions but nothing closer than 30 minutes.  Elective induction discussed.  Patient strongly desires.  No induction spots available for the next 7 days.  Membrane sweeping performed.

## 2021-06-28 NOTE — Progress Notes (Signed)
OB-Pt present for routine prenatal care. Pt stated having a lot of pain and contractions off and on.

## 2021-06-30 ENCOUNTER — Encounter: Payer: Medicaid Other | Admitting: Obstetrics and Gynecology

## 2021-07-04 ENCOUNTER — Other Ambulatory Visit: Payer: Self-pay

## 2021-07-04 ENCOUNTER — Other Ambulatory Visit
Admission: RE | Admit: 2021-07-04 | Discharge: 2021-07-04 | Disposition: A | Discharge status: Home / Self Care | Payer: Medicaid Other | Source: Ambulatory Visit | Attending: Obstetrics and Gynecology | Admitting: Obstetrics and Gynecology

## 2021-07-04 ENCOUNTER — Encounter: Payer: Self-pay | Admitting: Obstetrics and Gynecology

## 2021-07-04 ENCOUNTER — Ambulatory Visit (INDEPENDENT_AMBULATORY_CARE_PROVIDER_SITE_OTHER): Payer: Medicaid Other | Admitting: Obstetrics and Gynecology

## 2021-07-04 VITALS — BP 138/87 | HR 85 | Wt 174.0 lb

## 2021-07-04 DIAGNOSIS — Z3483 Encounter for supervision of other normal pregnancy, third trimester: Secondary | ICD-10-CM

## 2021-07-04 DIAGNOSIS — Z3A39 39 weeks gestation of pregnancy: Secondary | ICD-10-CM

## 2021-07-04 DIAGNOSIS — Z20822 Contact with and (suspected) exposure to covid-19: Secondary | ICD-10-CM | POA: Diagnosis not present

## 2021-07-04 LAB — POCT URINALYSIS DIPSTICK OB
Bilirubin, UA: NEGATIVE
Glucose, UA: NEGATIVE
Ketones, UA: NEGATIVE
Nitrite, UA: NEGATIVE
Spec Grav, UA: 1.01 (ref 1.010–1.025)
Urobilinogen, UA: 0.2 E.U./dL
pH, UA: 7.5 (ref 5.0–8.0)

## 2021-07-04 NOTE — Patient Instructions (Signed)
Labor Induction ?Labor induction is when steps are taken to cause a pregnant woman to begin the labor process. Most women go into labor on their own between 37 weeks and 42 weeks of pregnancy. When this does not happen, or when there is a medical need for labor to begin, steps may be taken to induce, or bring on, labor. ?Labor induction causes a pregnant woman's uterus to contract. It also causes the cervix to soften (ripen), open (dilate), and thin out. Usually, labor is not induced before 39 weeks of pregnancy unless there is a medical reason to do so. ?When is labor induction considered? ?Labor induction may be right for you if: ?Your pregnancy lasts longer than 41 to 42 weeks. ?Your placenta is separating from your uterus (placental abruption). ?You have a rupture of membranes and your labor does not begin. ?You have health problems, like diabetes or high blood pressure (preeclampsia) during your pregnancy. ?Your baby has stopped growing or does not have enough amniotic fluid. ?Before labor induction begins, your health care provider will consider the following factors: ?Your medical condition and the baby's condition. ?How many weeks you have been pregnant. ?How mature the baby's lungs are. ?The condition of your cervix. ?The position of the baby. ?The size of your birth canal. ?Tell a health care provider about: ?Any allergies you have. ?All medicines you are taking, including vitamins, herbs, eye drops, creams, and over-the-counter medicines. ?Any problems you or your family members have had with anesthetic medicines. ?Any surgeries you have had. ?Any blood disorders you have. ?Any medical conditions you have. ?What are the risks? ?Generally, this is a safe procedure. However, problems may occur, including: ?Failed induction. ?Changes in fetal heart rate, such as being too high, too low, or irregular (erratic). ?Infection in the mother or the baby. ?Increased risk of having a cesarean delivery. ?Breaking off  (abruption) of the placenta from the uterus. This is rare. ?Rupture of the uterus. This is very rare. ?Your baby could fail to get enough blood flow or oxygen. This can be life-threatening. ?When induction is needed for medical reasons, the benefits generally outweigh the risks. ?What happens during the procedure? ?During the procedure, your health care provider will use one of these methods to induce labor: ?Stripping the membranes. In this method, the amniotic sac tissue is gently separated from the cervix. This causes the following to happen: ?Your cervix stretches, which in turn causes the release of prostaglandins. ?Prostaglandins induce labor and cause the uterus to contract. ?This procedure is often done in an office visit. You will be sent home to wait for contractions to begin. ?Prostaglandin medicine. This medicine starts contractions and causes the cervix to dilate and ripen. This can be taken by mouth (orally) or by being inserted into the vagina (suppository). ?Inserting a small, thin tube (catheter) with a balloon into the vagina and then expanding the balloon with water to dilate the cervix. ?Breaking the water. In this method, a small instrument is used to make a small hole in the amniotic sac. This eventually causes the amniotic sac to break. Contractions should begin within a few hours. ?Medicine to trigger or strengthen contractions. This medicine is given through an IV that is inserted into a vein in your arm. ?This procedure may vary among health care providers and hospitals. ?Where to find more information ?March of Dimes: www.marchofdimes.org ?The American College of Obstetricians and Gynecologists: www.acog.org ?Summary ?Labor induction causes a pregnant woman's uterus to contract. It also causes the cervix   to soften (ripen), open (dilate), and thin out. ?Labor is usually not induced before 39 weeks of pregnancy unless there is a medical reason to do so. ?When induction is needed for medical  reasons, the benefits generally outweigh the risks. ?Talk with your health care provider about which methods of labor induction are right for you. ?This information is not intended to replace advice given to you by your health care provider. Make sure you discuss any questions you have with your health care provider. ?Document Revised: 05/26/2020 Document Reviewed: 05/26/2020 ?Elsevier Patient Education ? 2022 Elsevier Inc. ? ?

## 2021-07-04 NOTE — Progress Notes (Signed)
ROB: Patient doing well, just  Noting pelvic pressure. Did have an episode of nausea/vomiting yesterday but thinks it was due to heartburn. Discussed plans for elective IOL on Thursday. To get COVID screening today.

## 2021-07-04 NOTE — Progress Notes (Signed)
ROB: Doing well no concerns.

## 2021-07-05 LAB — SARS CORONAVIRUS 2 (TAT 6-24 HRS): SARS Coronavirus 2: NEGATIVE

## 2021-07-06 ENCOUNTER — Inpatient Hospital Stay
Admission: EM | Admit: 2021-07-06 | Discharge: 2021-07-07 | DRG: 807 | Disposition: A | Discharge status: Home / Self Care | Payer: Medicaid Other | Attending: Obstetrics and Gynecology | Admitting: Obstetrics and Gynecology

## 2021-07-06 ENCOUNTER — Other Ambulatory Visit: Payer: Self-pay

## 2021-07-06 ENCOUNTER — Inpatient Hospital Stay: Payer: Medicaid Other | Admitting: Anesthesiology

## 2021-07-06 ENCOUNTER — Encounter: Payer: Self-pay | Admitting: Obstetrics and Gynecology

## 2021-07-06 DIAGNOSIS — Z3A40 40 weeks gestation of pregnancy: Secondary | ICD-10-CM | POA: Diagnosis not present

## 2021-07-06 DIAGNOSIS — O26833 Pregnancy related renal disease, third trimester: Secondary | ICD-10-CM

## 2021-07-06 DIAGNOSIS — O480 Post-term pregnancy: Secondary | ICD-10-CM | POA: Diagnosis not present

## 2021-07-06 DIAGNOSIS — O9952 Diseases of the respiratory system complicating childbirth: Secondary | ICD-10-CM | POA: Diagnosis not present

## 2021-07-06 DIAGNOSIS — O211 Hyperemesis gravidarum with metabolic disturbance: Secondary | ICD-10-CM | POA: Diagnosis not present

## 2021-07-06 DIAGNOSIS — N23 Unspecified renal colic: Secondary | ICD-10-CM | POA: Diagnosis not present

## 2021-07-06 DIAGNOSIS — O26893 Other specified pregnancy related conditions, third trimester: Secondary | ICD-10-CM | POA: Diagnosis not present

## 2021-07-06 DIAGNOSIS — N12 Tubulo-interstitial nephritis, not specified as acute or chronic: Secondary | ICD-10-CM | POA: Diagnosis not present

## 2021-07-06 DIAGNOSIS — J90 Pleural effusion, not elsewhere classified: Secondary | ICD-10-CM

## 2021-07-06 LAB — CBC
HCT: 38.9 % (ref 36.0–46.0)
Hemoglobin: 13.5 g/dL (ref 12.0–15.0)
MCH: 30.1 pg (ref 26.0–34.0)
MCHC: 34.7 g/dL (ref 30.0–36.0)
MCV: 86.8 fL (ref 80.0–100.0)
Platelets: 246 10*3/uL (ref 150–400)
RBC: 4.48 MIL/uL (ref 3.87–5.11)
RDW: 13.5 % (ref 11.5–15.5)
WBC: 11.2 10*3/uL — ABNORMAL HIGH (ref 4.0–10.5)
nRBC: 0 % (ref 0.0–0.2)

## 2021-07-06 LAB — TYPE AND SCREEN
ABO/RH(D): A POS
Antibody Screen: NEGATIVE

## 2021-07-06 MED ORDER — IBUPROFEN 600 MG PO TABS: 600.0000 mg | ORAL_TABLET | Freq: Four times a day (QID) | ORAL | Status: DC

## 2021-07-06 MED ORDER — DIPHENHYDRAMINE HCL 50 MG/ML IJ SOLN: 12.5000 mg | INTRAMUSCULAR | Status: DC | PRN

## 2021-07-06 MED ORDER — LACTATED RINGERS IV SOLN
500.0000 mL | INTRAVENOUS | Status: DC | PRN
  Administered 2021-07-06: 1000 mL via INTRAVENOUS

## 2021-07-06 MED ORDER — PHENYLEPHRINE 40 MCG/ML (10ML) SYRINGE FOR IV PUSH (FOR BLOOD PRESSURE SUPPORT)
80.0000 ug | PREFILLED_SYRINGE | INTRAVENOUS | Status: DC | PRN
  Filled 2021-07-06: qty 10

## 2021-07-06 MED ORDER — MISOPROSTOL 25 MCG QUARTER TABLET
50.0000 ug | ORAL_TABLET | ORAL | Status: DC | PRN
  Administered 2021-07-06: 50 ug via VAGINAL
  Filled 2021-07-06: qty 2

## 2021-07-06 MED ORDER — ZOLPIDEM TARTRATE 5 MG PO TABS: 5.0000 mg | ORAL_TABLET | Freq: Every evening | ORAL | Status: DC | PRN

## 2021-07-06 MED ORDER — OXYTOCIN 10 UNIT/ML IJ SOLN
INTRAMUSCULAR | Status: AC
  Filled 2021-07-06: qty 2

## 2021-07-06 MED ORDER — FENTANYL-BUPIVACAINE-NACL 0.5-0.125-0.9 MG/250ML-% EP SOLN
12.0000 mL/h | EPIDURAL | Status: DC | PRN
  Administered 2021-07-06: 12 mL/h via EPIDURAL

## 2021-07-06 MED ORDER — BUTORPHANOL TARTRATE 1 MG/ML IJ SOLN: 1.0000 mg | INTRAMUSCULAR | Status: DC | PRN

## 2021-07-06 MED ORDER — DIPHENHYDRAMINE HCL 25 MG PO CAPS: 25.0000 mg | ORAL_CAPSULE | Freq: Four times a day (QID) | ORAL | Status: DC | PRN

## 2021-07-06 MED ORDER — ACETAMINOPHEN 325 MG PO TABS: 650.0000 mg | ORAL_TABLET | ORAL | Status: DC | PRN

## 2021-07-06 MED ORDER — LACTATED RINGERS IV SOLN: 500.0000 mL | Freq: Once | INTRAVENOUS | Status: AC

## 2021-07-06 MED ORDER — TERBUTALINE SULFATE 1 MG/ML IJ SOLN: 0.2500 mg | Freq: Once | INTRAMUSCULAR | Status: DC | PRN

## 2021-07-06 MED ORDER — EPHEDRINE 5 MG/ML INJ
10.0000 mg | INTRAVENOUS | Status: DC | PRN
  Filled 2021-07-06: qty 2

## 2021-07-06 MED ORDER — PRENATAL MULTIVITAMIN CH
1.0000 | ORAL_TABLET | Freq: Every day | ORAL | Status: DC
  Filled 2021-07-06: qty 1

## 2021-07-06 MED ORDER — PRENATAL MULTIVITAMIN CH: 1.0000 | ORAL_TABLET | Freq: Every day | ORAL | Status: DC

## 2021-07-06 MED ORDER — LIDOCAINE HCL (PF) 1 % IJ SOLN
INTRAMUSCULAR | Status: DC | PRN
  Administered 2021-07-06: 2 mL via SUBCUTANEOUS

## 2021-07-06 MED ORDER — IBUPROFEN 100 MG/5ML PO SUSP
600.0000 mg | Freq: Four times a day (QID) | ORAL | Status: DC
  Administered 2021-07-06 – 2021-07-07 (×3): 600 mg via ORAL
  Filled 2021-07-06 (×13): qty 30

## 2021-07-06 MED ORDER — MISOPROSTOL 50MCG HALF TABLET
ORAL_TABLET | ORAL | Status: AC
  Filled 2021-07-06: qty 1

## 2021-07-06 MED ORDER — AMMONIA AROMATIC IN INHA
RESPIRATORY_TRACT | Status: AC
  Filled 2021-07-06: qty 10

## 2021-07-06 MED ORDER — FENTANYL-BUPIVACAINE-NACL 0.5-0.125-0.9 MG/250ML-% EP SOLN
EPIDURAL | Status: AC
  Filled 2021-07-06: qty 250

## 2021-07-06 MED ORDER — SOD CITRATE-CITRIC ACID 500-334 MG/5ML PO SOLN: 30.0000 mL | ORAL | Status: DC | PRN

## 2021-07-06 MED ORDER — SIMETHICONE 80 MG PO CHEW: 80.0000 mg | CHEWABLE_TABLET | ORAL | Status: DC | PRN

## 2021-07-06 MED ORDER — LIDOCAINE HCL (PF) 1 % IJ SOLN
30.0000 mL | INTRAMUSCULAR | Status: DC | PRN
  Filled 2021-07-06: qty 30

## 2021-07-06 MED ORDER — OXYTOCIN-SODIUM CHLORIDE 30-0.9 UT/500ML-% IV SOLN
2.5000 [IU]/h | INTRAVENOUS | Status: DC
  Filled 2021-07-06: qty 500

## 2021-07-06 MED ORDER — MISOPROSTOL 200 MCG PO TABS
ORAL_TABLET | ORAL | Status: AC
  Filled 2021-07-06: qty 4

## 2021-07-06 MED ORDER — LIDOCAINE HCL (PF) 1 % IJ SOLN
INTRAMUSCULAR | Status: AC
  Filled 2021-07-06: qty 30

## 2021-07-06 MED ORDER — OXYTOCIN BOLUS FROM INFUSION
333.0000 mL | Freq: Once | INTRAVENOUS | Status: AC
  Administered 2021-07-06: 333 mL via INTRAVENOUS

## 2021-07-06 MED ORDER — OXYCODONE-ACETAMINOPHEN 5-325 MG PO TABS: 2.0000 | ORAL_TABLET | ORAL | Status: DC | PRN

## 2021-07-06 MED ORDER — OXYTOCIN-SODIUM CHLORIDE 30-0.9 UT/500ML-% IV SOLN
2.5000 [IU]/h | INTRAVENOUS | Status: DC | PRN
  Administered 2021-07-06: 2.5 [IU]/h via INTRAVENOUS

## 2021-07-06 MED ORDER — BENZOCAINE-MENTHOL 20-0.5 % EX AERO: 1.0000 "application " | INHALATION_SPRAY | CUTANEOUS | Status: DC | PRN

## 2021-07-06 MED ORDER — OXYCODONE-ACETAMINOPHEN 5-325 MG PO TABS
1.0000 | ORAL_TABLET | ORAL | Status: DC | PRN
  Administered 2021-07-06: 1 via ORAL
  Filled 2021-07-06: qty 1

## 2021-07-06 MED ORDER — ACETAMINOPHEN 160 MG/5ML PO SOLN
650.0000 mg | ORAL | Status: DC | PRN
  Administered 2021-07-06: 650 mg via ORAL
  Filled 2021-07-06 (×3): qty 20.3

## 2021-07-06 MED ORDER — DOCUSATE SODIUM 100 MG PO CAPS
100.0000 mg | ORAL_CAPSULE | Freq: Two times a day (BID) | ORAL | Status: DC
  Administered 2021-07-06 – 2021-07-07 (×2): 100 mg via ORAL
  Filled 2021-07-06 (×2): qty 1

## 2021-07-06 MED ORDER — OXYTOCIN-SODIUM CHLORIDE 30-0.9 UT/500ML-% IV SOLN: 1.0000 m[IU]/min | INTRAVENOUS | Status: DC

## 2021-07-06 MED ORDER — OXYCODONE-ACETAMINOPHEN 5-325 MG PO TABS: 1.0000 | ORAL_TABLET | ORAL | Status: DC | PRN

## 2021-07-06 MED ORDER — BUPIVACAINE HCL (PF) 0.25 % IJ SOLN
INTRAMUSCULAR | Status: DC | PRN
  Administered 2021-07-06: 4 mL via EPIDURAL
  Administered 2021-07-06: 3 mL via EPIDURAL

## 2021-07-06 MED ORDER — ONDANSETRON HCL 4 MG/2ML IJ SOLN: 4.0000 mg | Freq: Four times a day (QID) | INTRAMUSCULAR | Status: DC | PRN

## 2021-07-06 MED ORDER — TETANUS-DIPHTH-ACELL PERTUSSIS 5-2.5-18.5 LF-MCG/0.5 IM SUSY
0.5000 mL | PREFILLED_SYRINGE | Freq: Once | INTRAMUSCULAR | Status: DC
  Filled 2021-07-06: qty 0.5

## 2021-07-06 NOTE — Progress Notes (Signed)
Pt arrived from home with complaints of increased vaginal discharge and intermittent contractions rated 5/10. White vaginal discharge noted upon assessment. Pt reports no bleeding and positive fetal movement. Monitors applied and assessing- VSS.  Report given to RN that will be taking her for IOL.  Pt scheduled for IOL today.

## 2021-07-06 NOTE — Anesthesia Preprocedure Evaluation (Addendum)
Anesthesia Evaluation  Patient identified by MRN, date of birth, ID band Patient awake    Reviewed: Allergy & Precautions, H&P , NPO status , Patient's Chart, lab work & pertinent test results, reviewed documented beta blocker date and time   Airway Mallampati: II  TM Distance: >3 FB Neck ROM: full    Dental no notable dental hx. (+) Teeth Intact   Pulmonary neg pulmonary ROS,    Pulmonary exam normal breath sounds clear to auscultation       Cardiovascular Exercise Tolerance: Good negative cardio ROS   Rhythm:regular Rate:Normal     Neuro/Psych  Headaches, negative psych ROS   GI/Hepatic negative GI ROS, Neg liver ROS,   Endo/Other  negative endocrine ROS  Renal/GU Renal disease     Musculoskeletal   Abdominal   Peds  Hematology  (+) Blood dyscrasia, anemia ,   Anesthesia Other Findings   Reproductive/Obstetrics (+) Pregnancy                            Anesthesia Physical Anesthesia Plan  ASA: 2  Anesthesia Plan: Epidural   Post-op Pain Management:    Induction:   PONV Risk Score and Plan:   Airway Management Planned:   Additional Equipment:   Intra-op Plan:   Post-operative Plan:   Informed Consent: I have reviewed the patients History and Physical, chart, labs and discussed the procedure including the risks, benefits and alternatives for the proposed anesthesia with the patient or authorized representative who has indicated his/her understanding and acceptance.       Plan Discussed with: Anesthesiologist and CRNA  Anesthesia Plan Comments:        Anesthesia Quick Evaluation

## 2021-07-06 NOTE — Anesthesia Procedure Notes (Signed)
Epidural Patient location during procedure: OB Start time: 07/06/2021 10:11 AM End time: 07/06/2021 10:15 AM  Staffing Anesthesiologist: Yevette Edwards, MD Resident/CRNA: Lynden Oxford, CRNA Performed: resident/CRNA   Preanesthetic Checklist Completed: patient identified, IV checked, site marked, risks and benefits discussed, surgical consent, monitors and equipment checked, pre-op evaluation and timeout performed  Epidural Patient position: sitting Prep: ChloraPrep Patient monitoring: heart rate, continuous pulse ox and blood pressure Approach: midline Location: L4-L5 Injection technique: LOR air  Needle:  Needle type: Tuohy  Needle gauge: 18 G Needle length: 9 cm and 9 Needle insertion depth: 7 and 7.5 cm Catheter type: closed end flexible Catheter size: 20 Guage Catheter at skin depth: 12 cm Test dose: negative and Other (0.25% bupivicaine)  Assessment Events: blood not aspirated, injection not painful, no injection resistance, no paresthesia and negative IV test  Additional Notes   Patient tolerated the insertion well without complications.Reason for block:procedure for pain

## 2021-07-06 NOTE — H&P (Signed)
History and Physical   HPI  Laura Sosa is a 35 y.o. G3P2002 at [redacted]w[redacted]d Estimated Date of Delivery: 07/05/21 who is being admitted induction.  She complains of contractions and leaking of fluid for 2 days.  Pregnancy complicated by severe hyperemesis, renal colic, pyelonephritis, lithotripsy, pleural effusion.   OB History  OB History  Gravida Para Term Preterm AB Living  3 2 2  0 0 2  SAB IAB Ectopic Multiple Live Births  0 0 0 0 2    # Outcome Date GA Lbr Len/2nd Weight Sex Delivery Anes PTL Lv  3 Current           2 Term 06/17/18 [redacted]w[redacted]d / 00:29 2780 g M Vag-Spont EPI  LIV     Name: Schlotter,BOY [redacted]w[redacted]d     Apgar1: 8  Apgar5: 9  1 Term 2011   3799 g F Vag-Spont   LIV    Obstetric Comments  G1 - diagnosed with cleft palate at age 78    PROBLEM LIST  Pregnancy complications or risks: Patient Active Problem List   Diagnosis Date Noted   Post-dates pregnancy 07/06/2021   Vaginal discharge    Pelvic pressure in pregnancy    Status post thoracentesis    Pleural effusion    Renal abscess 03/23/2021   Anemia of pregnancy in second trimester 03/05/2021   Kidney stones 03/04/2021   E. coli UTI (urinary tract infection) 03/04/2021   Status post placement of ureteral stent 02/26/2021   Renal calculus, left 02/25/2021   Hyperemesis affecting pregnancy, antepartum 11/30/2020   History of pyelonephritis 12/18/2017   Rubella non-immune status, antepartum 12/18/2017   FHx: cleft palate 12/18/2017    Prenatal labs and studies: ABO, Rh: --/--/PENDING (11/10 10-09-2004) Antibody: PENDING (11/10 0823) Rubella: 0.95 (04/28 1535) RPR: Non Reactive (08/26 1130)  HBsAg: Negative (04/28 1535)  HIV: Non Reactive (04/28 1535)  08-13-1968-- (10/14 1626)   Past Medical History:  Diagnosis Date   Bilateral kidney stones    Headache    History of kidney stones    Migraine    Pyelonephritis 04/17/2016     Past Surgical History:  Procedure Laterality Date   COLPOSCOPY      CYSTOSCOPY WITH STENT PLACEMENT Left 02/26/2021   Procedure: CYSTOSCOPY WITH STENT PLACEMENT;  Surgeon: 04/29/2021, MD;  Location: [facility];  Service: Urology;  Laterality: Left;   CYSTOSCOPY/RETROGRADE/URETEROSCOPY  03/15/2017   Procedure: CYSTOSCOPY/RETROGRADE/URETEROSCOPY;  Surgeon: 03/17/2017, MD;  Location: [facility];  Service: Urology;;   CYSTOSCOPY/URETEROSCOPY/HOLMIUM LASER/STENT PLACEMENT Left 03/06/2021   Procedure: CYSTOSCOPY/URETEROSCOPY/HOLMIUM LASER/STENT PLACEMENT;  Surgeon: 05/07/2021, MD;  Location: [facility];  Service: Urology;  Laterality: Left;   LEEP       Medications    Current Discharge Medication List     CONTINUE these medications which have NOT CHANGED   Details  Prenatal Vit-Fe Fumarate-FA (MULTIVITAMIN-PRENATAL) 27-0.8 MG TABS tablet Take 1 tablet by mouth daily at 12 noon.         Allergies  Patient has no known allergies.  Review of Systems  Pertinent items noted in HPI and remainder of comprehensive ROS otherwise negative.  Physical Exam  BP 138/86   Pulse 65   Temp 97.9 F (36.6 C) (Oral)   Resp 18   Ht 5\' 3"  (1.6 m)   Wt 78.9 kg   LMP  (LMP Unknown)   BMI 30.82 kg/m   Lungs:  CTA B Cardio: RRR without M/R/G Abd: Soft, gravid,  NT Presentation: cephalic EXT: No C/C/ 1+ Edema DTRs: 2+ B CERVIX: Dilation: 3 Effacement (%): 70 Cervical Position: Posterior Station: -3 Presentation: Vertex Exam by:: Audry Pecina  See Prenatal records for more detailed PE.   AROM : Small amount of clear fluid. 50 mcg misoprostol placed vaginally.  Applicator used.  FHR:  Variability: Good {> 6 bpm)  Toco: Uterine Contractions: Every 2 to 4 minutes  Test Results  Results for orders placed or performed during the hospital encounter of 07/06/21 (from the past 24 hour(s))  CBC     Status: Abnormal   Collection Time: 07/06/21  8:23 AM  Result Value Ref Range   WBC 11.2 (H) 4.0 - 10.5 K/uL   RBC 4.48 3.87 - 5.11 MIL/uL    Hemoglobin 13.5 12.0 - 15.0 g/dL   HCT 41.9 37.9 - 02.4 %   MCV 86.8 80.0 - 100.0 fL   MCH 30.1 26.0 - 34.0 pg   MCHC 34.7 30.0 - 36.0 g/dL   RDW 09.7 35.3 - 29.9 %   Platelets 246 150 - 400 K/uL   nRBC 0.0 0.0 - 0.2 %  Type and screen     Status: None (Preliminary result)   Collection Time: 07/06/21  8:23 AM  Result Value Ref Range   ABO/RH(D) PENDING    Antibody Screen PENDING    Sample Expiration      07/09/2021,2359 Performed at [facility], [address], [place], [place] [address]      Assessment   [id number] at [redacted]w[redacted]d Estimated Date of Delivery: 07/05/21  The fetus is reassuring.   Patient Active Problem List   Diagnosis Date Noted   Post-dates pregnancy 07/06/2021   Vaginal discharge    Pelvic pressure in pregnancy    Status post thoracentesis    Pleural effusion    Renal abscess 03/23/2021   Anemia of pregnancy in second trimester 03/05/2021   Kidney stones 03/04/2021   E. coli UTI (urinary tract infection) 03/04/2021   Status post placement of ureteral stent 02/26/2021   Renal calculus, left 02/25/2021   Hyperemesis affecting pregnancy, antepartum 11/30/2020   History of pyelonephritis 12/18/2017   Rubella non-immune status, antepartum 12/18/2017   FHx: cleft palate 12/18/2017    Plan  1. Admit to L&D :   2. EFM: -- Category 1 3. Stadol or Epidural if desired.   4. Admission labs  5.  Expect vaginal delivery.  Elonda Husky, M.D. 07/06/2021 9:40 AM

## 2021-07-06 NOTE — Discharge Instructions (Signed)

## 2021-07-06 NOTE — Progress Notes (Signed)
Pt declined MMR vaccine prior to discharge.

## 2021-07-07 ENCOUNTER — Encounter: Payer: Self-pay | Admitting: Obstetrics and Gynecology

## 2021-07-07 ENCOUNTER — Encounter: Payer: Medicaid Other | Admitting: Obstetrics and Gynecology

## 2021-07-07 LAB — RPR: RPR Ser Ql: NONREACTIVE

## 2021-07-07 NOTE — Anesthesia Postprocedure Evaluation (Signed)
Anesthesia Post Note  Patient: Laura Sosa  Procedure(s) Performed: AN AD HOC LABOR EPIDURAL  Patient location during evaluation: Mother Baby Anesthesia Type: Epidural Level of consciousness: awake and alert Pain management: pain level controlled Vital Signs Assessment: post-procedure vital signs reviewed and stable Respiratory status: spontaneous breathing, nonlabored ventilation and respiratory function stable Cardiovascular status: stable Postop Assessment: no headache, no backache and epidural receding Anesthetic complications: no   No notable events documented.   Last Vitals:  Vitals:   07/07/21 0013 07/07/21 0337  BP: 138/90 132/90  Pulse: 67 67  Resp: 20 20  Temp: 36.8 C 36.7 C  SpO2: 99% 96%    Last Pain:  Vitals:   07/07/21 0337  TempSrc: Oral  PainSc:                  Jules Schick

## 2021-07-07 NOTE — Discharge Summary (Signed)
      Patient Name: Laura Sosa DOB: 01-06-1986 MRN: [id number]                            Discharge Summary  Date of Admission: 07/06/2021 Date of Discharge: 07/07/2021 Delivering Provider: Linzie Collin   Admitting Diagnosis: Post-dates pregnancy [O48.0] at [redacted]w[redacted]d Secondary diagnosis:  Active Problems:   Post-dates pregnancy   Mode of Delivery: normal spontaneous vaginal delivery              Discharge diagnosis: Term Pregnancy Delivered      Intrapartum Procedures: Atificial rupture of membranes, epidural, and misoprostol   Post partum procedures:   Complications: none                     Discharge Day SOAP Note:  Progress Note - Vaginal Delivery  Laura Sosa is a 35 y.o. G3P2002 now PP day 1 s/p Vaginal, Spontaneous . Delivery was uncomplicated  Subjective  The patient has the following complaints: has no unusual complaints  Pain is controlled with current medications.   Patient is urinating without difficulty.  She is ambulating well.   Desires discharge  Objective  Vital signs: BP 132/90   Pulse 67   Temp 98 F (36.7 C) (Oral)   Resp 20   Ht 5\' 3"  (1.6 m)   Wt 78.9 kg   LMP  (LMP Unknown)   SpO2 96%   BMI 30.82 kg/m   Physical Exam: Gen: NAD Fundus Fundal Tone: Firm  Lochia Amount: Small        Data Review Labs: Lab Results  Component Value Date   WBC 11.2 (H) 07/06/2021   HGB 13.5 07/06/2021   HCT 38.9 07/06/2021   MCV 86.8 07/06/2021   PLT 246 07/06/2021   CBC Latest Ref Rng & Units 07/06/2021 04/21/2021 04/03/2021  WBC 4.0 - 10.5 K/uL 11.2(H) 9.2 9.8  Hemoglobin 12.0 - 15.0 g/dL 06/03/2021 95.2 84.1)  Hematocrit 36.0 - 46.0 % 38.9 35.4 29.1(L)  Platelets 150 - 400 K/uL 246 327 399   A POS  Edinburgh Score: Edinburgh Postnatal Depression Scale Screening Tool 07/06/2021  I have been able to laugh and see the funny side of things. (No Data)    Assessment/Plan  Active Problems:   Post-dates pregnancy    Plan for  discharge today.  Discharge Instructions: Per After Visit Summary. Activity: Advance as tolerated. Pelvic rest for 6 weeks.  Also refer to After Visit Summary Diet: Regular Medications: Allergies as of 07/07/2021   No Known Allergies      Medication List     TAKE these medications    multivitamin-prenatal 27-0.8 MG Tabs tablet Take 1 tablet by mouth daily at 12 noon.       Outpatient follow up:   Follow-up Information     13/06/2021, MD Follow up in 2 week(s).   Specialties: Obstetrics and Gynecology, Radiology Why: Video if desired Contact information: [address] [place] [phone]                Postpartum contraception: Will discuss at first office visit post-partum  Discharged Condition: good  Discharged to: home  Newborn Data: Disposition:home with mother  Apgars: APGAR (1 MIN):   APGAR (5 MINS):   APGAR (10 MINS):    Baby Feeding: Bottle    [phone], M.D. 07/07/2021 8:00 AM

## 2021-07-07 NOTE — Progress Notes (Signed)
Pt discharged with infant. Discharge instructions, prescriptions, and follow up appointments given to and reviewed with patient. Pt verbalized understanding. Escorted out by auxillary.  

## 2021-07-27 ENCOUNTER — Encounter: Payer: Self-pay | Admitting: Obstetrics and Gynecology

## 2021-07-27 ENCOUNTER — Telehealth (INDEPENDENT_AMBULATORY_CARE_PROVIDER_SITE_OTHER): Payer: Medicaid Other | Admitting: Obstetrics and Gynecology

## 2021-07-27 NOTE — Progress Notes (Signed)
Virtual Visit via Video Note  I connected with Laura Sosa on 07/27/21 at  2:00 PM EST by video and verified that I was speaking with the correct person using two identifiers.    Laura Sosa is a 35 y.o. [id number] who LMP was No LMP recorded. I discussed the limitations, risks, security and privacy concerns of performing an evaluation and management service by video and the availability of in person appointments. I also discussed with the patient that there may be a patient responsible charge related to this service. The patient expressed understanding and agreed to proceed.  Location of patient:  HOME  Patient gave explicit verbal consent for video visit:  YES  Location of provider:  [facility] office  Persons other than physician and patient involved in provider conference:  None   Subjective:   History of Present Illness:    She had a very complicated prenatal course but subsequently had a vaginal birth 3 weeks ago.  She reports that she is bottlefeeding.  She says that her bleeding has now stopped.  She reports that she is doing well.  She has lost a significant amount of weight but she continues to eat well. She is interested in long-term birth control and we have previously discussed IUD.  Hx: The following portions of the patient's history were reviewed and updated as appropriate:             She  has a past medical history of Bilateral kidney stones, Headache, History of kidney stones, Migraine, and Pyelonephritis (04/17/2016). She does not have any pertinent problems on file. She  has a past surgical history that includes Cystoscopy/retrograde/ureteroscopy (03/15/2017); LEEP; Colposcopy; Cystoscopy with stent placement (Left, 02/26/2021); and Cystoscopy/ureteroscopy/holmium laser/stent placement (Left, 03/06/2021). Her family history includes Asthma in her daughter; Diabetes in her father and mother; Emphysema in her maternal grandmother; Heart defect in her half-sister;  Hypertension in her father, mother, and paternal grandmother; Lung cancer in her maternal grandmother. She  reports that she has never smoked. She has never used smokeless tobacco. She reports that she does not drink alcohol and does not use drugs. She has a current medication list which includes the following prescription(s): multivitamin-prenatal. She has No Known Allergies.       Review of Systems:  Review of Systems  Constitutional: Denied constitutional symptoms, night sweats, recent illness, fatigue, fever, insomnia and weight loss.  Eyes: Denied eye symptoms, eye pain, photophobia, vision change and visual disturbance.  Ears/Nose/Throat/Neck: Denied ear, nose, throat or neck symptoms, hearing loss, nasal discharge, sinus congestion and sore throat.  Cardiovascular: Denied cardiovascular symptoms, arrhythmia, chest pain/pressure, edema, exercise intolerance, orthopnea and palpitations.  Respiratory: Denied pulmonary symptoms, asthma, pleuritic pain, productive sputum, cough, dyspnea and wheezing.  Gastrointestinal: Denied, gastro-esophageal reflux, melena, nausea and vomiting.  Genitourinary: Denied genitourinary symptoms including symptomatic vaginal discharge, pelvic relaxation issues, and urinary complaints.  Musculoskeletal: Denied musculoskeletal symptoms, stiffness, swelling, muscle weakness and myalgia.  Dermatologic: Denied dermatology symptoms, rash and scar.  Neurologic: Denied neurology symptoms, dizziness, headache, neck pain and syncope.  Psychiatric: Denied psychiatric symptoms, anxiety and depression.  Endocrine: Denied endocrine symptoms including hot flashes and night sweats.   Meds:   Current Outpatient Medications on File Prior to Visit  Medication Sig Dispense Refill   Prenatal Vit-Fe Fumarate-FA (MULTIVITAMIN-PRENATAL) 27-0.8 MG TABS tablet Take 1 tablet by mouth daily at 12 noon.     No current facility-administered medications on file prior to visit.     Assessment:  [id number] Patient Active Problem List   Diagnosis Date Noted   Post-dates pregnancy 07/06/2021   Vaginal discharge    Pelvic pressure in pregnancy    Status post thoracentesis    Pleural effusion    Renal abscess 03/23/2021   Anemia of pregnancy in second trimester 03/05/2021   Kidney stones 03/04/2021   E. coli UTI (urinary tract infection) 03/04/2021   Status post placement of ureteral stent 02/26/2021   Renal calculus, left 02/25/2021   Hyperemesis affecting pregnancy, antepartum 11/30/2020   History of pyelonephritis 12/18/2017   Rubella non-immune status, antepartum 12/18/2017   FHx: cleft palate 12/18/2017     1. Postpartum care and examination immediately after delivery     Patient doing well approximately 3 weeks postpartum.  Reports no problems.  Plan:            1.  I have encouraged her to continue prenatal vitamins to at least 6 weeks  2.  We discussed birth control methods mostly IUD and she is interested in receiving an IUD at the time of her postpartum visit. Orders No orders of the defined types were placed in this encounter.   No orders of the defined types were placed in this encounter.     F/U  Return in about 3 weeks (around 08/17/2021).  Elonda Husky, M.D. 07/27/2021 2:23 PM

## 2021-08-10 ENCOUNTER — Encounter: Payer: Self-pay | Admitting: Obstetrics and Gynecology

## 2021-08-17 ENCOUNTER — Encounter: Payer: Medicaid Other | Admitting: Obstetrics and Gynecology

## 2021-08-24 ENCOUNTER — Encounter: Payer: Medicaid Other | Admitting: Obstetrics and Gynecology

## 2021-09-08 ENCOUNTER — Encounter: Payer: Self-pay | Admitting: Obstetrics and Gynecology

## 2021-09-08 ENCOUNTER — Other Ambulatory Visit: Payer: Self-pay

## 2021-09-08 ENCOUNTER — Ambulatory Visit (INDEPENDENT_AMBULATORY_CARE_PROVIDER_SITE_OTHER): Payer: Medicaid Other | Admitting: Obstetrics and Gynecology

## 2021-09-08 VITALS — BP 113/85 | HR 84 | Ht 63.0 in | Wt 161.0 lb

## 2021-09-08 DIAGNOSIS — F53 Postpartum depression: Secondary | ICD-10-CM

## 2021-09-08 DIAGNOSIS — Z30014 Encounter for initial prescription of intrauterine contraceptive device: Secondary | ICD-10-CM | POA: Diagnosis not present

## 2021-09-08 LAB — POCT URINE PREGNANCY: Preg Test, Ur: NEGATIVE

## 2021-09-08 NOTE — Progress Notes (Signed)
HPI:      [name] is a 36 y.o. [id number] who LMP was Patient's last menstrual period was 08/18/2021 (approximate).  Subjective:   She presents today because she would like an IUD for birth control. In addition she says that she feels sad every day and cries 2-3 times per week.  She states that this is disruptive and not normal for her.  She reports that she has never been on antidepressant medication before.    Hx: The following portions of the patient's history were reviewed and updated as appropriate:             She  has a past medical history of Bilateral kidney stones, Headache, History of kidney stones, Migraine, and Pyelonephritis (04/17/2016). She does not have any pertinent problems on file. She  has a past surgical history that includes Cystoscopy/retrograde/ureteroscopy (03/15/2017); LEEP; Colposcopy; Cystoscopy with stent placement (Left, 02/26/2021); and Cystoscopy/ureteroscopy/holmium laser/stent placement (Left, 03/06/2021). Her family history includes Asthma in her daughter; Diabetes in her father and mother; Emphysema in her maternal grandmother; Heart defect in her half-sister; Hypertension in her father, mother, and paternal grandmother; Lung cancer in her maternal grandmother. She  reports that she has never smoked. She has never used smokeless tobacco. She reports that she does not drink alcohol and does not use drugs. She has a current medication list which includes the following prescription(s): multivitamin-prenatal. She has No Known Allergies.       Review of Systems:  Review of Systems  Constitutional: Denied constitutional symptoms, night sweats, recent illness, fatigue, fever, insomnia and weight loss.  Eyes: Denied eye symptoms, eye pain, photophobia, vision change and visual disturbance.  Ears/Nose/Throat/Neck: Denied ear, nose, throat or neck symptoms, hearing loss, nasal discharge, sinus congestion and sore throat.  Cardiovascular: Denied  cardiovascular symptoms, arrhythmia, chest pain/pressure, edema, exercise intolerance, orthopnea and palpitations.  Respiratory: Denied pulmonary symptoms, asthma, pleuritic pain, productive sputum, cough, dyspnea and wheezing.  Gastrointestinal: Denied, gastro-esophageal reflux, melena, nausea and vomiting.  Genitourinary: Denied genitourinary symptoms including symptomatic vaginal discharge, pelvic relaxation issues, and urinary complaints.  Musculoskeletal: Denied musculoskeletal symptoms, stiffness, swelling, muscle weakness and myalgia.  Dermatologic: Denied dermatology symptoms, rash and scar.  Neurologic: Denied neurology symptoms, dizziness, headache, neck pain and syncope.  Psychiatric: See HPI for additional information.  Endocrine: Denied endocrine symptoms including hot flashes and night sweats.   Meds:   Current Outpatient Medications on File Prior to Visit  Medication Sig Dispense Refill   Prenatal Vit-Fe Fumarate-FA (MULTIVITAMIN-PRENATAL) 27-0.8 MG TABS tablet Take 1 tablet by mouth daily at 12 noon.     No current facility-administered medications on file prior to visit.      Objective:     Vitals:   09/08/21 0941  BP: 113/85  Pulse: 84   Filed Weights   09/08/21 0941  Weight: 161 lb (73 kg)              Physical examination   Pelvic:   Vulva: Normal appearance.  No lesions.  Vagina: No lesions or abnormalities noted.  Support: Normal pelvic support.  Urethra No masses tenderness or scarring.  Meatus Normal size without lesions or prolapse.  Cervix: Normal appearance.  No lesions.  Anus: Normal exam.  No lesions.  Perineum: Normal exam.  No lesions.        Bimanual   Uterus: Normal size.  Non-tender.  Mobile.  Retroverted  Adnexae: No masses.  Non-tender to palpation.  Cul-de-sac: Negative for abnormality.   IUD  Procedure Pt has read the booklet and signed the appropriate forms regarding the Mirena IUD.  All of her questions have been answered.    The cervix was cleansed with betadine solution.  After sounding the uterus and noting the position, the IUD was placed in the usual manner without problem.  The string was cut to the appropriate length.  The patient tolerated the procedure well.  [facility] # = [id number]           Assessment:    [id number] Patient Active Problem List   Diagnosis Date Noted   Post-dates pregnancy 07/06/2021   Vaginal discharge    Pelvic pressure in pregnancy    Status post thoracentesis    Pleural effusion    Renal abscess 03/23/2021   Anemia of pregnancy in second trimester 03/05/2021   Kidney stones 03/04/2021   E. coli UTI (urinary tract infection) 03/04/2021   Status post placement of ureteral stent 02/26/2021   Renal calculus, left 02/25/2021   Hyperemesis affecting pregnancy, antepartum 11/30/2020   History of pyelonephritis 12/18/2017   Rubella non-immune status, antepartum 12/18/2017   FHx: cleft palate 12/18/2017     1. Encounter for initial prescription of intrauterine contraceptive device (IUD)   2. Postpartum depression     Her depression is disrupting her life and she would like some help with this.  She is willing to take medication.   Plan:            1.  We have discussed postpartum depression in detail.  Patient would like to start Zoloft.  We will follow-up in 4 weeks   Orders Orders Placed This Encounter  Procedures   POCT urine pregnancy    No orders of the defined types were placed in this encounter.     F/U  Return in about 4 weeks (around 10/06/2021). I spent 22 minutes involved in the care of this patient preparing to see the patient by obtaining and reviewing her medical history (including labs, imaging tests and prior procedures), documenting clinical information in the electronic health record (EHR), counseling and coordinating care plans, writing and sending prescriptions, ordering tests or procedures and in direct communicating with the patient and medical staff  discussing pertinent items from her history and physical exam.  Finis Bud, M.D. 09/08/2021 10:48 AM

## 2021-09-18 ENCOUNTER — Encounter: Payer: Self-pay | Admitting: Obstetrics and Gynecology

## 2021-09-21 ENCOUNTER — Other Ambulatory Visit: Payer: Self-pay | Admitting: Obstetrics and Gynecology

## 2021-09-21 DIAGNOSIS — F53 Postpartum depression: Secondary | ICD-10-CM

## 2021-09-21 MED ORDER — SERTRALINE HCL 50 MG PO TABS: 50.0000 mg | ORAL_TABLET | Freq: Every day | ORAL | 2 refills | Status: AC

## 2021-10-11 ENCOUNTER — Encounter: Payer: Self-pay | Admitting: Obstetrics and Gynecology

## 2021-10-11 NOTE — Telephone Encounter (Signed)
Spoke with patient regarding concerns. Patient had IUD placed on 09/08/21 and since then has experienced bleeding and cramping. Patient states bleeding moderately every day and cramping for approximately a hour per day. Patient states ibuprofen helps with pain. Patient states concern and would like to be advised on what she should do.  Patient has not had 4 week f/u   Please advise

## 2021-10-12 ENCOUNTER — Telehealth: Payer: Self-pay | Admitting: Obstetrics and Gynecology

## 2021-10-12 NOTE — Telephone Encounter (Signed)
Called pt to schedule follow up appt. No answer, Left VM for pt to call the office to schedule a visit.

## 2021-10-13 ENCOUNTER — Encounter: Payer: Medicaid Other | Admitting: Obstetrics and Gynecology

## 2021-10-26 ENCOUNTER — Encounter: Payer: Medicaid Other | Admitting: Obstetrics and Gynecology

## 2021-10-26 DIAGNOSIS — Z30431 Encounter for routine checking of intrauterine contraceptive device: Secondary | ICD-10-CM

## 2021-10-27 ENCOUNTER — Encounter: Payer: Medicaid Other | Admitting: Obstetrics and Gynecology

## 2021-10-27 DIAGNOSIS — Z30431 Encounter for routine checking of intrauterine contraceptive device: Secondary | ICD-10-CM

## 2021-11-30 ENCOUNTER — Encounter: Payer: Medicaid Other | Admitting: Obstetrics and Gynecology

## 2021-12-06 ENCOUNTER — Encounter: Payer: Medicaid Other | Admitting: Obstetrics and Gynecology

## 2021-12-06 DIAGNOSIS — Z30431 Encounter for routine checking of intrauterine contraceptive device: Secondary | ICD-10-CM

## 2022-03-30 DIAGNOSIS — G43009 Migraine without aura, not intractable, without status migrainosus: Secondary | ICD-10-CM | POA: Diagnosis not present

## 2022-03-30 DIAGNOSIS — S93432A Sprain of tibiofibular ligament of left ankle, initial encounter: Secondary | ICD-10-CM | POA: Diagnosis not present

## 2022-03-30 DIAGNOSIS — K0889 Other specified disorders of teeth and supporting structures: Secondary | ICD-10-CM | POA: Diagnosis not present

## 2022-03-30 DIAGNOSIS — Z6831 Body mass index (BMI) 31.0-31.9, adult: Secondary | ICD-10-CM | POA: Diagnosis not present

## 2022-04-03 ENCOUNTER — Emergency Department: Payer: Medicaid Other

## 2022-04-03 ENCOUNTER — Other Ambulatory Visit: Payer: Self-pay

## 2022-04-03 ENCOUNTER — Encounter: Payer: Self-pay | Admitting: Obstetrics and Gynecology

## 2022-04-03 ENCOUNTER — Emergency Department
Admission: EM | Admit: 2022-04-03 | Discharge: 2022-04-03 | Discharge status: Against Medical Advice | Payer: Medicaid Other | Attending: Emergency Medicine | Admitting: Emergency Medicine

## 2022-04-03 DIAGNOSIS — R1032 Left lower quadrant pain: Secondary | ICD-10-CM | POA: Diagnosis not present

## 2022-04-03 DIAGNOSIS — N3000 Acute cystitis without hematuria: Secondary | ICD-10-CM

## 2022-04-03 DIAGNOSIS — R1110 Vomiting, unspecified: Secondary | ICD-10-CM | POA: Diagnosis not present

## 2022-04-03 DIAGNOSIS — N200 Calculus of kidney: Secondary | ICD-10-CM | POA: Diagnosis not present

## 2022-04-03 DIAGNOSIS — Z5321 Procedure and treatment not carried out due to patient leaving prior to being seen by health care provider: Secondary | ICD-10-CM | POA: Insufficient documentation

## 2022-04-03 DIAGNOSIS — Z87440 Personal history of urinary (tract) infections: Secondary | ICD-10-CM | POA: Diagnosis not present

## 2022-04-03 DIAGNOSIS — N132 Hydronephrosis with renal and ureteral calculous obstruction: Secondary | ICD-10-CM | POA: Diagnosis not present

## 2022-04-03 DIAGNOSIS — N201 Calculus of ureter: Secondary | ICD-10-CM

## 2022-04-03 DIAGNOSIS — R109 Unspecified abdominal pain: Secondary | ICD-10-CM | POA: Diagnosis present

## 2022-04-03 DIAGNOSIS — N136 Pyonephrosis: Secondary | ICD-10-CM | POA: Diagnosis not present

## 2022-04-03 DIAGNOSIS — Z87442 Personal history of urinary calculi: Secondary | ICD-10-CM | POA: Diagnosis not present

## 2022-04-03 DIAGNOSIS — R300 Dysuria: Secondary | ICD-10-CM | POA: Diagnosis not present

## 2022-04-03 DIAGNOSIS — N1330 Unspecified hydronephrosis: Secondary | ICD-10-CM | POA: Diagnosis not present

## 2022-04-03 LAB — URINALYSIS, ROUTINE W REFLEX MICROSCOPIC
Bilirubin Urine: NEGATIVE
Glucose, UA: NEGATIVE mg/dL
Ketones, ur: NEGATIVE mg/dL
Nitrite: POSITIVE — AB
Protein, ur: 30 mg/dL — AB
RBC / HPF: >50 RBC/hpf — ABNORMAL HIGH (ref 0–5)
Specific Gravity, Urine: 1.024 (ref 1.005–1.030)
pH: 6 (ref 5.0–8.0)

## 2022-04-03 LAB — CBC WITH DIFFERENTIAL/PLATELET
Abs Immature Granulocytes: 0.08 10*3/uL — ABNORMAL HIGH (ref 0.00–0.07)
Basophils Absolute: 0.1 10*3/uL (ref 0.0–0.1)
Basophils Relative: 1 %
Eosinophils Absolute: 0.2 10*3/uL (ref 0.0–0.5)
Eosinophils Relative: 2 %
HCT: 45.3 % (ref 36.0–46.0)
Hemoglobin: 15.2 g/dL — ABNORMAL HIGH (ref 12.0–15.0)
Immature Granulocytes: 1 %
Lymphocytes Relative: 22 %
Lymphs Abs: 2.9 10*3/uL (ref 0.7–4.0)
MCH: 28.3 pg (ref 26.0–34.0)
MCHC: 33.6 g/dL (ref 30.0–36.0)
MCV: 84.4 fL (ref 80.0–100.0)
Monocytes Absolute: 0.6 10*3/uL (ref 0.1–1.0)
Monocytes Relative: 5 %
Neutro Abs: 9.5 10*3/uL — ABNORMAL HIGH (ref 1.7–7.7)
Neutrophils Relative %: 69 %
Platelets: 333 10*3/uL (ref 150–400)
RBC: 5.37 MIL/uL — ABNORMAL HIGH (ref 3.87–5.11)
RDW: 11.9 % (ref 11.5–15.5)
WBC: 13.4 10*3/uL — ABNORMAL HIGH (ref 4.0–10.5)
nRBC: 0 % (ref 0.0–0.2)

## 2022-04-03 LAB — POC URINE PREG, ED: Preg Test, Ur: NEGATIVE

## 2022-04-03 LAB — BASIC METABOLIC PANEL
Anion gap: 9 (ref 5–15)
BUN: 16 mg/dL (ref 6–20)
CO2: 23 mmol/L (ref 22–32)
Calcium: 9.2 mg/dL (ref 8.9–10.3)
Chloride: 108 mmol/L (ref 98–111)
Creatinine, Ser: 0.91 mg/dL (ref 0.44–1.00)
GFR, Estimated: >60 mL/min (ref >60)
Glucose, Bld: 88 mg/dL (ref 70–99)
Potassium: 3.8 mmol/L (ref 3.5–5.1)
Sodium: 140 mmol/L (ref 135–145)

## 2022-04-03 MED ORDER — KETOROLAC TROMETHAMINE 30 MG/ML IJ SOLN: 30.0000 mg | Freq: Once | INTRAMUSCULAR | Status: DC

## 2022-04-03 MED ORDER — ONDANSETRON HCL 4 MG/2ML IJ SOLN: 4.0000 mg | Freq: Once | INTRAMUSCULAR | Status: DC

## 2022-04-03 MED ORDER — SODIUM CHLORIDE 0.9 % IV SOLN: 1.0000 g | Freq: Once | INTRAVENOUS | Status: DC

## 2022-04-03 MED ORDER — TAMSULOSIN HCL 0.4 MG PO CAPS: 0.4000 mg | ORAL_CAPSULE | Freq: Once | ORAL | Status: DC

## 2022-04-03 MED ORDER — SODIUM CHLORIDE 0.9 % IV BOLUS: 1000.0000 mL | Freq: Once | INTRAVENOUS | Status: DC

## 2022-04-03 NOTE — ED Notes (Signed)
No answer when called several times from lobby 

## 2022-04-03 NOTE — ED Provider Triage Note (Signed)
Emergency Medicine Provider Triage Evaluation Note  [place] R Pott, a 36 y.o. female  was evaluated in triage.  Pt complains of right flank pain with a history of kidney stones.  Patient presents to the ED with a history of kidney stones and bladder infection related to pregnancy.  Last 11 November has not had any urinary symptoms since then.  She presents at this time with right flank pain and difficulty voiding but denies any hematuria, dysuria, NVD.  Review of Systems  Positive: Flank pain, retention Negative: FCS  Physical Exam  BP (!) 125/106 (BP Location: Right Arm)   Pulse 76   Temp 98.2 F (36.8 C) (Oral)   Resp 17   Ht 5\' 3"  (1.6 m)   Wt 79.4 kg   LMP  (Exact Date)   SpO2 99%   BMI 31.00 kg/m  Gen:   Awake, no distress  NAD Resp:  Normal effort CTA MSK:   Moves extremities without difficulty  Other:  Soft, nontender  Medical Decision Making  Medically screening exam initiated at 5:48 PM.  Appropriate orders placed.  R Renfroe was informed that the remainder of the evaluation will be completed by another provider, this initial triage assessment does not replace that evaluation, and the importance of remaining in the ED until their evaluation is complete.  Patient with a history of kidney stones and bladder infection, present to the ED with onset of symptoms for the last 3 days.   [place], PA-C 04/03/22 1750

## 2022-04-03 NOTE — ED Triage Notes (Signed)
Pt has a long  hx of kidney stones and bladder infections during pregnancy. Pt delivered in  November and has not had one since. Pt reports she has right flank pain, unable to void. Pt denies nausea, vomiting and diarrhea.

## 2022-04-04 DIAGNOSIS — N201 Calculus of ureter: Secondary | ICD-10-CM | POA: Diagnosis not present

## 2022-04-04 DIAGNOSIS — N132 Hydronephrosis with renal and ureteral calculous obstruction: Secondary | ICD-10-CM | POA: Diagnosis not present

## 2022-04-04 DIAGNOSIS — N390 Urinary tract infection, site not specified: Secondary | ICD-10-CM | POA: Diagnosis not present

## 2022-04-04 DIAGNOSIS — N136 Pyonephrosis: Secondary | ICD-10-CM | POA: Diagnosis not present

## 2022-04-05 DIAGNOSIS — N201 Calculus of ureter: Secondary | ICD-10-CM | POA: Diagnosis not present

## 2022-04-05 DIAGNOSIS — N390 Urinary tract infection, site not specified: Secondary | ICD-10-CM | POA: Diagnosis not present

## 2022-04-06 DIAGNOSIS — N201 Calculus of ureter: Secondary | ICD-10-CM | POA: Diagnosis not present

## 2022-04-06 LAB — URINE CULTURE: Culture: >=100000 — AB

## 2022-04-07 NOTE — Progress Notes (Signed)
ED Antimicrobial Stewardship Positive Culture Follow Up   [place] R Hink is an 36 y.o. female who presented to [facility] on 04/03/2022 with a chief complaint of  Chief Complaint  Patient presents with   Flank Pain    Right      Recent Results (from the past 720 hour(s))  Urine Culture     Status: Abnormal   Collection Time: 04/03/22 10:27 PM   Specimen: Urine, Random  Result Value Ref Range Status   Specimen Description URINE, RANDOM  Final   Special Requests NONE  Final   Culture (A)  Final    >=100,000 COLONIES/mL ESCHERICHIA COLI Two isolates with different morphologies were identified as the same organism.The most resistant organism was reported.    Report Status 04/06/2022 FINAL  Final   Organism ID, Bacteria ESCHERICHIA COLI (A)  Final      Susceptibility   Escherichia coli - MIC*    AMPICILLIN >=32 RESISTANT Resistant     CEFAZOLIN <=4 SENSITIVE Sensitive     CEFEPIME <=0.12 SENSITIVE Sensitive     CEFTRIAXONE <=0.25 SENSITIVE Sensitive     CIPROFLOXACIN <=0.25 SENSITIVE Sensitive     GENTAMICIN <=1 SENSITIVE Sensitive     IMIPENEM <=0.25 SENSITIVE Sensitive     NITROFURANTOIN <=16 SENSITIVE Sensitive     TRIMETH/SULFA >=320 RESISTANT Resistant     AMPICILLIN/SULBACTAM 8 SENSITIVE Sensitive     PIP/TAZO Value in next row Sensitive      <=4 SENSITIVEPerformed at [facility], [address], [place], [place] [address]    * >=100,000 COLONIES/mL ESCHERICHIA COLI   Patient left [facility] 04/03/22 before being seen by MD. Per Epic- pt seen at [facility] urology clinic on 04/06/22 and per dispense hx was ordered Augmentin.   Syniah Berne A 04/07/2022, 1:24 PM Clinical Pharmacist

## 2022-04-19 ENCOUNTER — Encounter: Payer: Medicaid Other | Admitting: Obstetrics and Gynecology

## 2022-04-19 DIAGNOSIS — Z30431 Encounter for routine checking of intrauterine contraceptive device: Secondary | ICD-10-CM

## 2022-04-26 DIAGNOSIS — R109 Unspecified abdominal pain: Secondary | ICD-10-CM | POA: Diagnosis not present

## 2022-04-26 DIAGNOSIS — Z01818 Encounter for other preprocedural examination: Secondary | ICD-10-CM | POA: Diagnosis not present

## 2022-04-26 DIAGNOSIS — Z960 Presence of urogenital implants: Secondary | ICD-10-CM | POA: Diagnosis not present

## 2022-05-01 DIAGNOSIS — Z6831 Body mass index (BMI) 31.0-31.9, adult: Secondary | ICD-10-CM | POA: Diagnosis not present

## 2022-05-01 DIAGNOSIS — N132 Hydronephrosis with renal and ureteral calculous obstruction: Secondary | ICD-10-CM | POA: Diagnosis not present

## 2022-05-01 DIAGNOSIS — E669 Obesity, unspecified: Secondary | ICD-10-CM | POA: Diagnosis not present

## 2022-05-01 DIAGNOSIS — N201 Calculus of ureter: Secondary | ICD-10-CM | POA: Diagnosis not present

## 2022-05-01 DIAGNOSIS — Z79899 Other long term (current) drug therapy: Secondary | ICD-10-CM | POA: Diagnosis not present

## 2022-05-01 DIAGNOSIS — Z791 Long term (current) use of non-steroidal anti-inflammatories (NSAID): Secondary | ICD-10-CM | POA: Diagnosis not present

## 2022-05-03 DIAGNOSIS — E860 Dehydration: Secondary | ICD-10-CM | POA: Diagnosis not present

## 2022-05-03 DIAGNOSIS — R509 Fever, unspecified: Secondary | ICD-10-CM | POA: Diagnosis not present

## 2022-05-03 DIAGNOSIS — T83192A Other mechanical complication of urinary stent, initial encounter: Secondary | ICD-10-CM | POA: Diagnosis not present

## 2022-05-03 DIAGNOSIS — Z960 Presence of urogenital implants: Secondary | ICD-10-CM | POA: Insufficient documentation

## 2022-05-03 DIAGNOSIS — D72829 Elevated white blood cell count, unspecified: Secondary | ICD-10-CM | POA: Diagnosis not present

## 2022-05-03 DIAGNOSIS — N390 Urinary tract infection, site not specified: Secondary | ICD-10-CM | POA: Diagnosis not present

## 2022-05-03 DIAGNOSIS — B9620 Unspecified Escherichia coli [E. coli] as the cause of diseases classified elsewhere: Secondary | ICD-10-CM | POA: Diagnosis not present

## 2022-05-03 DIAGNOSIS — R109 Unspecified abdominal pain: Secondary | ICD-10-CM | POA: Diagnosis not present

## 2022-05-03 DIAGNOSIS — N12 Tubulo-interstitial nephritis, not specified as acute or chronic: Secondary | ICD-10-CM | POA: Diagnosis not present

## 2022-05-03 DIAGNOSIS — R000 Tachycardia, unspecified: Secondary | ICD-10-CM | POA: Diagnosis not present

## 2022-05-03 DIAGNOSIS — N200 Calculus of kidney: Secondary | ICD-10-CM | POA: Diagnosis not present

## 2022-05-03 DIAGNOSIS — R112 Nausea with vomiting, unspecified: Secondary | ICD-10-CM | POA: Diagnosis not present

## 2022-05-03 DIAGNOSIS — R319 Hematuria, unspecified: Secondary | ICD-10-CM | POA: Diagnosis not present

## 2022-05-03 DIAGNOSIS — T83592A Infection and inflammatory reaction due to indwelling ureteral stent, initial encounter: Secondary | ICD-10-CM | POA: Diagnosis not present

## 2022-05-03 DIAGNOSIS — T8384XA Pain from genitourinary prosthetic devices, implants and grafts, initial encounter: Secondary | ICD-10-CM | POA: Diagnosis not present

## 2022-05-04 DIAGNOSIS — T8384XA Pain from genitourinary prosthetic devices, implants and grafts, initial encounter: Secondary | ICD-10-CM | POA: Diagnosis not present

## 2022-05-04 DIAGNOSIS — N390 Urinary tract infection, site not specified: Secondary | ICD-10-CM | POA: Diagnosis not present

## 2022-05-04 DIAGNOSIS — B9620 Unspecified Escherichia coli [E. coli] as the cause of diseases classified elsewhere: Secondary | ICD-10-CM | POA: Diagnosis not present

## 2022-05-04 DIAGNOSIS — N12 Tubulo-interstitial nephritis, not specified as acute or chronic: Secondary | ICD-10-CM | POA: Diagnosis not present

## 2022-05-05 DIAGNOSIS — Z466 Encounter for fitting and adjustment of urinary device: Secondary | ICD-10-CM | POA: Diagnosis not present

## 2022-05-05 DIAGNOSIS — B9620 Unspecified Escherichia coli [E. coli] as the cause of diseases classified elsewhere: Secondary | ICD-10-CM | POA: Diagnosis not present

## 2022-05-05 DIAGNOSIS — R109 Unspecified abdominal pain: Secondary | ICD-10-CM | POA: Diagnosis not present

## 2022-05-05 DIAGNOSIS — Z87442 Personal history of urinary calculi: Secondary | ICD-10-CM | POA: Diagnosis not present

## 2022-05-05 DIAGNOSIS — N12 Tubulo-interstitial nephritis, not specified as acute or chronic: Secondary | ICD-10-CM | POA: Diagnosis not present

## 2022-05-06 DIAGNOSIS — B9620 Unspecified Escherichia coli [E. coli] as the cause of diseases classified elsewhere: Secondary | ICD-10-CM | POA: Diagnosis not present

## 2022-05-06 DIAGNOSIS — N12 Tubulo-interstitial nephritis, not specified as acute or chronic: Secondary | ICD-10-CM | POA: Diagnosis not present

## 2022-05-07 DIAGNOSIS — N12 Tubulo-interstitial nephritis, not specified as acute or chronic: Secondary | ICD-10-CM | POA: Diagnosis not present

## 2022-05-07 DIAGNOSIS — B9629 Other Escherichia coli [E. coli] as the cause of diseases classified elsewhere: Secondary | ICD-10-CM | POA: Diagnosis not present

## 2022-05-08 DIAGNOSIS — B9629 Other Escherichia coli [E. coli] as the cause of diseases classified elsewhere: Secondary | ICD-10-CM | POA: Diagnosis not present

## 2022-05-08 DIAGNOSIS — N12 Tubulo-interstitial nephritis, not specified as acute or chronic: Secondary | ICD-10-CM | POA: Diagnosis not present

## 2022-05-09 ENCOUNTER — Telehealth: Payer: Self-pay | Admitting: Licensed Clinical Social Worker

## 2022-05-09 NOTE — Patient Outreach (Signed)
  Care Coordination [facility] Note Transition Care Management Follow-up Telephone Call Date of discharge and from where: 05/08/22 from [facility]. How have you been since you were released from the hospital? "So much better" Any questions or concerns? No  Items Reviewed: Did the pt receive and understand the discharge instructions provided? Yes  Medications obtained and verified? Yes  Other? No  Any new allergies since your discharge? No  Dietary orders reviewed? No Do you have support at home? Yes   Home Care and Equipment/Supplies: Were home health services ordered? No If so, what is the name of the agency? NA  Has the agency set up a time to come to the patient's home? not applicable Were any new equipment or medical supplies ordered?  No What is the name of the medical supply agency? NA Were you able to get the supplies/equipment? not applicable Do you have any questions related to the use of the equipment or supplies? na  Functional Questionnaire: (I = Independent and D = Dependent) ADLs: I  Bathing/Dressing- I  Meal Prep- I  Eating- I  Maintaining continence- I  Transferring/Ambulation- I  Managing Meds- I  Follow up appointments reviewed:  PCP Hospital f/u appt confirmed? Patient will schedule a follow up appointment with her PCP at [facility]. Specialist Hospital f/u appt confirmed? No   Are transportation arrangements needed? No  If their condition worsens, is the pt aware to call PCP or go to the Emergency Dept.? Yes Was the patient provided with contact information for the PCP's office or ED? Yes Was to pt encouraged to call back with questions or concerns? Yes  Dickie La, BSW, MSW, [employer] LCSW [facility]  [facility] [email or web address] Phone: [phone]

## 2022-11-01 ENCOUNTER — Encounter (HOSPITAL_COMMUNITY): Payer: Self-pay | Admitting: Emergency Medicine

## 2022-11-01 ENCOUNTER — Emergency Department (HOSPITAL_COMMUNITY): Payer: Medicaid Other

## 2022-11-01 ENCOUNTER — Emergency Department (HOSPITAL_COMMUNITY)
Admission: EM | Admit: 2022-11-01 | Discharge: 2022-11-01 | Disposition: A | Discharge status: Home / Self Care | Payer: Medicaid Other | Attending: Emergency Medicine | Admitting: Emergency Medicine

## 2022-11-01 ENCOUNTER — Other Ambulatory Visit: Payer: Self-pay

## 2022-11-01 DIAGNOSIS — R079 Chest pain, unspecified: Secondary | ICD-10-CM | POA: Insufficient documentation

## 2022-11-01 DIAGNOSIS — R0602 Shortness of breath: Secondary | ICD-10-CM | POA: Diagnosis not present

## 2022-11-01 LAB — BASIC METABOLIC PANEL
Anion gap: 8 (ref 5–15)
BUN: 12 mg/dL (ref 6–20)
CO2: 25 mmol/L (ref 22–32)
Calcium: 8.9 mg/dL (ref 8.9–10.3)
Chloride: 103 mmol/L (ref 98–111)
Creatinine, Ser: 0.9 mg/dL (ref 0.44–1.00)
GFR, Estimated: >60 mL/min (ref >60)
Glucose, Bld: 104 mg/dL — ABNORMAL HIGH (ref 70–99)
Potassium: 3.9 mmol/L (ref 3.5–5.1)
Sodium: 136 mmol/L (ref 135–145)

## 2022-11-01 LAB — CBC
HCT: 42.5 % (ref 36.0–46.0)
Hemoglobin: 14.2 g/dL (ref 12.0–15.0)
MCH: 29 pg (ref 26.0–34.0)
MCHC: 33.4 g/dL (ref 30.0–36.0)
MCV: 86.9 fL (ref 80.0–100.0)
Platelets: 284 10*3/uL (ref 150–400)
RBC: 4.89 MIL/uL (ref 3.87–5.11)
RDW: 12.5 % (ref 11.5–15.5)
WBC: 11.9 10*3/uL — ABNORMAL HIGH (ref 4.0–10.5)
nRBC: 0 % (ref 0.0–0.2)

## 2022-11-01 LAB — TROPONIN I (HIGH SENSITIVITY)
Troponin I (High Sensitivity): <2 ng/L (ref <18)
Troponin I (High Sensitivity): <2 ng/L (ref <18)

## 2022-11-01 NOTE — ED Provider Notes (Signed)
[facility] Provider Note   CSN: [id number] Arrival date & time: 11/01/22  1829     History  Chief Complaint  Patient presents with   Chest Pain    [place] R Talavera is a 37 y.o. female.  37 year old female with a history of obesity who presents to the emergency department with chest pain and shortness of breath.  Patient reports that last night she approximately 10 minutes of an epigastric/substernal squeezing sensation that spontaneously resolved.  Came back again today and lasted for approximately 1 hour and says that the pain was so severe took her breath away.  Does not appear to have been exertional or pleuritic.  Did have some warmth and thinks she may have sweated with it.  No radiation of the pain.  No nausea or vomiting.  No diarrhea.  No cough recently.  Says that spontaneously resolved and has not recurred.  No history of abdominal surgeries.  No recent surgeries.  No history of DVT or PE.  Not on any hormones or OCPs.  Did start taking Tirzepatide/niacinamide for weight loss recently and is wondering if this could be related.       Home Medications Prior to Admission medications   Medication Sig Start Date End Date Taking? Authorizing Provider  Prenatal Vit-Fe Fumarate-FA (MULTIVITAMIN-PRENATAL) 27-0.8 MG TABS tablet Take 1 tablet by mouth daily at 12 noon.    [provider]  sertraline (ZOLOFT) 50 MG tablet Take 1 tablet (50 mg total) by mouth daily. 09/21/21   Harlin Heys, MD      Allergies    Patient has no known allergies.    Review of Systems   Review of Systems  Physical Exam Updated Vital Signs BP 105/79   Pulse 92   Temp 97.7 F (36.5 C) (Oral)   Resp (!) 21   Ht 5\' 3"  (1.6 m)   Wt 79.4 kg   SpO2 100%   BMI 31.01 kg/m  Physical Exam  ED Results / Procedures / Treatments   Labs (all labs ordered are listed, but only abnormal results are displayed) Labs Reviewed  BASIC METABOLIC PANEL  - Abnormal; Notable for the following components:      Result Value   Glucose, Bld 104 (*)    All other components within normal limits  CBC - Abnormal; Notable for the following components:   WBC 11.9 (*)    All other components within normal limits  TROPONIN I (HIGH SENSITIVITY)  TROPONIN I (HIGH SENSITIVITY)    EKG EKG Interpretation  Date/Time:  Thursday November 01 2022 06:27:38 EST Ventricular Rate:  88 PR Interval:  190 QRS Duration: 101 QT Interval:  383 QTC Calculation: 464 R Axis:   28 Text Interpretation: Sinus rhythm Low voltage, precordial leads RSR' in V1 or V2, right VCD or RVH Borderline T abnormalities, anterior leads When compared to 03/14/21 no significant changes were found Confirmed by Margaretmary Eddy [phone]) on 11/01/2022 7:10:29 AM  Radiology DG Chest 2 View  Result Date: 11/01/2022 CLINICAL DATA:  37 year old female with history of sudden onset of mid chest pain. EXAM: CHEST - 2 VIEW COMPARISON:  Chest x-ray 04/01/2021. FINDINGS: Lung volumes are normal. No consolidative airspace disease. Mild scarring in the region of the lingula. No pleural effusions. No pneumothorax. No pulmonary nodule or mass noted. Pulmonary vasculature and the cardiomediastinal silhouette are within normal limits. IMPRESSION: No radiographic evidence of acute cardiopulmonary disease. Electronically Signed   By: Vinnie Langton  M.D.   On: 11/01/2022 06:55    Procedures Procedures    Medications Ordered in ED Medications - No data to display  ED Course/ Medical Decision Making/ A&P                             Medical Decision Making Amount and/or Complexity of Data Reviewed Labs: ordered. Radiology: ordered.   [place] R Ouzts is a 37 y.o. female with comorbidities that complicate the patient evaluation including obesity who presents emergency department chest pain shortness of breath  Initial Ddx:  MI, PE, pneumonia, URI, biliary colic, pancreatitis  MDM:  Patient's  description of pain is not typical of any of the above diagnoses.  Does not have significant risk factors for any of the above diagnoses.  Will check EKG and troponins for MI.  No significant risk factors for pulmonary embolism and will be rare for it spontaneously resolved so feel that additional testing not indicated.  Will check chest x-ray with her chest pain but feel that pneumonia and URI unlikely.    Plan:  Labs Troponin EKG Chest x-ray  ED Summary/Re-evaluation:  Patient underwent the above workup that was unremarkable.  Did discuss lipase and right upper quadrant ultrasound with the patient since these were not ordered from triage when she had her initial labs done.  She requested to go at this time since she was feeling much better.  Did explain these diagnoses and how they could potentially be causing her symptoms.  Discussed return precautions with her prior to discharge.  Instructed to follow-up with her primary doctor in several days.  This patient presents to the ED for concern of complaints listed in HPI, this involves an extensive number of treatment options, and is a complaint that carries with it a high risk of complications and morbidity. Disposition including potential need for admission considered.   Dispo: DC Home. Return precautions discussed including, but not limited to, those listed in the AVS. Allowed pt time to ask questions which were answered fully prior to dc.  Additional history obtained from mother Records reviewed Outpatient Clinic Notes The following labs were independently interpreted: Chemistry and show no acute abnormality I independently reviewed the following imaging with scope of interpretation limited to determining acute life threatening conditions related to emergency care: Chest x-ray and agree with the radiologist interpretation with the following exceptions: none I personally reviewed and interpreted cardiac monitoring: normal sinus rhythm  I  personally reviewed and interpreted the pt's EKG: see above for interpretation  I have reviewed the patients home medications and made adjustments as needed  Final Clinical Impression(s) / ED Diagnoses Final diagnoses:  Chest pain, unspecified type    Rx / DC Orders ED Discharge Orders     None         Fransico Meadow, MD 11/02/22 [phone]

## 2022-11-01 NOTE — Discharge Instructions (Addendum)
You were seen for your chest pain in the emergency department.   At home, please take Tylenol for any pain that you may have.    Follow-up with your primary doctor in 2-3 days regarding your visit.   Return immediately to the emergency department if you experience any of the following: Worsening pain, difficulty breathing, unexplained vomiting or sweating, or any other concerning symptoms.    Thank you for visiting our Emergency Department. It was a pleasure taking care of you today.

## 2022-11-01 NOTE — ED Triage Notes (Signed)
Pt c/o mid sternal chest pain that started while she was laying in bed this morning. Pt states her arms started to tingle.

## 2022-11-01 NOTE — ED Notes (Signed)
EDP at bedside  

## 2022-12-29 ENCOUNTER — Emergency Department (HOSPITAL_COMMUNITY): Payer: Medicaid Other

## 2022-12-29 ENCOUNTER — Emergency Department (HOSPITAL_COMMUNITY)
Admission: EM | Admit: 2022-12-29 | Discharge: 2022-12-29 | Disposition: A | Discharge status: Home / Self Care | Payer: Medicaid Other | Attending: Emergency Medicine | Admitting: Emergency Medicine

## 2022-12-29 ENCOUNTER — Other Ambulatory Visit: Payer: Self-pay

## 2022-12-29 ENCOUNTER — Encounter (HOSPITAL_COMMUNITY): Payer: Self-pay

## 2022-12-29 DIAGNOSIS — R1031 Right lower quadrant pain: Secondary | ICD-10-CM

## 2022-12-29 LAB — URINALYSIS, ROUTINE W REFLEX MICROSCOPIC
Bacteria, UA: NONE SEEN
Bilirubin Urine: NEGATIVE
Glucose, UA: NEGATIVE mg/dL
Ketones, ur: 20 mg/dL — AB
Leukocytes,Ua: NEGATIVE
Nitrite: NEGATIVE
Protein, ur: 30 mg/dL — AB
Specific Gravity, Urine: 1.027 (ref 1.005–1.030)
pH: 5 (ref 5.0–8.0)

## 2022-12-29 LAB — COMPREHENSIVE METABOLIC PANEL
ALT: 13 U/L (ref 0–44)
AST: 11 U/L — ABNORMAL LOW (ref 15–41)
Albumin: 4.4 g/dL (ref 3.5–5.0)
Alkaline Phosphatase: 81 U/L (ref 38–126)
Anion gap: 8 (ref 5–15)
BUN: 16 mg/dL (ref 6–20)
CO2: 23 mmol/L (ref 22–32)
Calcium: 8.9 mg/dL (ref 8.9–10.3)
Chloride: 102 mmol/L (ref 98–111)
Creatinine, Ser: 0.89 mg/dL (ref 0.44–1.00)
GFR, Estimated: >60 mL/min (ref >60)
Glucose, Bld: 93 mg/dL (ref 70–99)
Potassium: 3.8 mmol/L (ref 3.5–5.1)
Sodium: 133 mmol/L — ABNORMAL LOW (ref 135–145)
Total Bilirubin: 1 mg/dL (ref 0.3–1.2)
Total Protein: 7.5 g/dL (ref 6.5–8.1)

## 2022-12-29 LAB — CBC
HCT: 44.4 % (ref 36.0–46.0)
Hemoglobin: 15.1 g/dL — ABNORMAL HIGH (ref 12.0–15.0)
MCH: 29.4 pg (ref 26.0–34.0)
MCHC: 34 g/dL (ref 30.0–36.0)
MCV: 86.4 fL (ref 80.0–100.0)
Platelets: 296 10*3/uL (ref 150–400)
RBC: 5.14 MIL/uL — ABNORMAL HIGH (ref 3.87–5.11)
RDW: 12.4 % (ref 11.5–15.5)
WBC: 10.4 10*3/uL (ref 4.0–10.5)
nRBC: 0 % (ref 0.0–0.2)

## 2022-12-29 LAB — POC URINE PREG, ED: Preg Test, Ur: NEGATIVE

## 2022-12-29 LAB — LIPASE, BLOOD: Lipase: 33 U/L (ref 11–51)

## 2022-12-29 MED ORDER — ONDANSETRON HCL 4 MG/2ML IJ SOLN
4.0000 mg | Freq: Once | INTRAMUSCULAR | Status: AC
  Administered 2022-12-29: 4 mg via INTRAVENOUS
  Filled 2022-12-29: qty 2

## 2022-12-29 MED ORDER — MORPHINE SULFATE (PF) 4 MG/ML IV SOLN
4.0000 mg | Freq: Once | INTRAVENOUS | Status: AC
  Administered 2022-12-29: 4 mg via INTRAVENOUS
  Filled 2022-12-29: qty 1

## 2022-12-29 MED ORDER — IOHEXOL 300 MG/ML  SOLN
100.0000 mL | Freq: Once | INTRAMUSCULAR | Status: AC | PRN
  Administered 2022-12-29: 100 mL via INTRAVENOUS

## 2022-12-29 MED ORDER — SODIUM CHLORIDE 0.9 % IV BOLUS
1000.0000 mL | Freq: Once | INTRAVENOUS | Status: AC
  Administered 2022-12-29: 1000 mL via INTRAVENOUS

## 2022-12-29 MED ORDER — IBUPROFEN 600 MG PO TABS: 600.0000 mg | ORAL_TABLET | Freq: Four times a day (QID) | ORAL | 0 refills | Status: AC | PRN

## 2022-12-29 MED ORDER — ONDANSETRON HCL 4 MG PO TABS: 4.0000 mg | ORAL_TABLET | Freq: Three times a day (TID) | ORAL | 0 refills | Status: AC | PRN

## 2022-12-29 MED ORDER — POLYETHYLENE GLYCOL 3350 17 GM/SCOOP PO POWD: 17.0000 g | Freq: Every day | ORAL | 0 refills | Status: AC

## 2022-12-29 MED ORDER — ONDANSETRON HCL 4 MG/2ML IJ SOLN: 4.0000 mg | Freq: Once | INTRAMUSCULAR | Status: DC | PRN

## 2022-12-29 MED ORDER — OXYCODONE-ACETAMINOPHEN 5-325 MG PO TABS
1.0000 | ORAL_TABLET | Freq: Once | ORAL | Status: AC
  Administered 2022-12-29: 1 via ORAL
  Filled 2022-12-29: qty 1

## 2022-12-29 NOTE — ED Provider Notes (Signed)
[place] EMERGENCY DEPARTMENT AT [facility] Provider Note  CSN: [id number] Arrival date & time: 12/29/22 1709  Chief Complaint(s) Abdominal Pain  HPI Laura Sosa is a 37 y.o. female with history of kidney stones presenting to the emergency department with abdominal pain.  She reports that she had abdominal pain which began in her upper abdomen.  Subsequently migrated to the right lower quadrant.  Reports associated nausea and vomiting, chills.  No urinary symptoms, no vaginal bleeding or discharge, no chest pain or shortness of breath, no fevers, no hematemesis.  No sick contacts.  Denies similar episode in the past.   Past Medical History Past Medical History:  Diagnosis Date   Bilateral kidney stones    Headache    History of kidney stones    Migraine    Pyelonephritis 04/17/2016   Patient Active Problem List   Diagnosis Date Noted   Post-dates pregnancy 07/06/2021   Vaginal discharge    Pelvic pressure in pregnancy    Status post thoracentesis    Pleural effusion    Renal abscess 03/23/2021   Anemia of pregnancy in second trimester 03/05/2021   Kidney stones 03/04/2021   E. coli UTI (urinary tract infection) 03/04/2021   Status post placement of ureteral stent 02/26/2021   Renal calculus, left 02/25/2021   Hyperemesis affecting pregnancy, antepartum 11/30/2020   History of pyelonephritis 12/18/2017   Rubella non-immune status, antepartum 12/18/2017   FHx: cleft palate 12/18/2017   Home Medication(s) Prior to Admission medications   Medication Sig Start Date End Date Taking? Authorizing Provider  ibuprofen (ADVIL) 600 MG tablet Take 1 tablet (600 mg total) by mouth every 6 (six) hours as needed. 12/29/22  Yes Lonell Grandchild, MD  ondansetron (ZOFRAN) 4 MG tablet Take 1 tablet (4 mg total) by mouth every 8 (eight) hours as needed for nausea or vomiting. 12/29/22  Yes Lonell Grandchild, MD  polyethylene glycol powder (GLYCOLAX/MIRALAX) 17 GM/SCOOP  powder Take 17 g by mouth daily. 12/29/22  Yes Lonell Grandchild, MD  Prenatal Vit-Fe Fumarate-FA (MULTIVITAMIN-PRENATAL) 27-0.8 MG TABS tablet Take 1 tablet by mouth daily at 12 noon.    [provider]  sertraline (ZOLOFT) 50 MG tablet Take 1 tablet (50 mg total) by mouth daily. 09/21/21   Linzie Collin, MD                                                                                                                                    Past Surgical History Past Surgical History:  Procedure Laterality Date   COLPOSCOPY     CYSTOSCOPY WITH STENT PLACEMENT Left 02/26/2021   Procedure: CYSTOSCOPY WITH STENT PLACEMENT;  Surgeon: Crista Elliot, MD;  Location: [facility] ORS;  Service: Urology;  Laterality: Left;   CYSTOSCOPY/RETROGRADE/URETEROSCOPY  03/15/2017   Procedure: CYSTOSCOPY/RETROGRADE/URETEROSCOPY;  Surgeon: Hildred Laser, MD;  Location: [facility] ORS;  Service: Urology;;   CYSTOSCOPY/URETEROSCOPY/HOLMIUM LASER/STENT  PLACEMENT Left 03/06/2021   Procedure: CYSTOSCOPY/URETEROSCOPY/HOLMIUM LASER/STENT PLACEMENT;  Surgeon: Vanna Scotland, MD;  Location: [facility] ORS;  Service: Urology;  Laterality: Left;   LEEP     Family History Family History  Problem Relation Age of Onset   Hypertension Mother    Diabetes Mother    Diabetes Father    Hypertension Father    Emphysema Maternal Grandmother    Lung cancer Maternal Grandmother    Asthma Daughter    Hypertension Paternal Grandmother    Heart defect Half-Sister        "boot shaped heart"   Kidney cancer Neg Hx    Bladder Cancer Neg Hx    Breast cancer Neg Hx    Ovarian cancer Neg Hx    Colon cancer Neg Hx     Social History Social History   Tobacco Use   Smoking status: Never   Smokeless tobacco: Never  Vaping Use   Vaping Use: Never used  Substance Use Topics   Alcohol use: No   Drug use: No   Allergies Patient has no known allergies.  Review of Systems Review of Systems  All other systems reviewed and are  negative.   Physical Exam Vital Signs  I have reviewed the triage vital signs BP 109/74 (BP Location: Left Arm)   Pulse 97   Temp 98.1 F (36.7 C) (Oral)   Resp 17   Ht 5\' 3"  (1.6 m)   Wt 66.2 kg   SpO2 100%   BMI 25.86 kg/m  Physical Exam Vitals and nursing note reviewed.  Constitutional:      General: She is not in acute distress.    Appearance: She is well-developed.  HENT:     Head: Normocephalic and atraumatic.     Mouth/Throat:     Mouth: Mucous membranes are moist.  Eyes:     Pupils: Pupils are equal, round, and reactive to light.  Cardiovascular:     Rate and Rhythm: Normal rate and regular rhythm.     Heart sounds: No murmur heard. Pulmonary:     Effort: Pulmonary effort is normal. No respiratory distress.     Breath sounds: Normal breath sounds.  Abdominal:     General: Abdomen is flat.     Palpations: Abdomen is soft.     Tenderness: There is abdominal tenderness in the right lower quadrant and periumbilical area.  Musculoskeletal:        General: No tenderness.     Right lower leg: No edema.     Left lower leg: No edema.  Skin:    General: Skin is warm and dry.  Neurological:     General: No focal deficit present.     Mental Status: She is alert. Mental status is at baseline.  Psychiatric:        Mood and Affect: Mood normal.        Behavior: Behavior normal.     ED Results and Treatments Labs (all labs ordered are listed, but only abnormal results are displayed) Labs Reviewed  COMPREHENSIVE METABOLIC PANEL - Abnormal; Notable for the following components:      Result Value   Sodium 133 (*)    AST 11 (*)    All other components within normal limits  CBC - Abnormal; Notable for the following components:   RBC 5.14 (*)    Hemoglobin 15.1 (*)    All other components within normal limits  URINALYSIS, ROUTINE W REFLEX MICROSCOPIC - Abnormal; Notable for the following components:  APPearance HAZY (*)    Hgb urine dipstick SMALL (*)     Ketones, ur 20 (*)    Protein, ur 30 (*)    All other components within normal limits  LIPASE, BLOOD  POC URINE PREG, ED                                                                                                                          Radiology CT ABDOMEN PELVIS W CONTRAST  Result Date: 12/29/2022 CLINICAL DATA:  Right lower quadrant abdominal pain EXAM: CT ABDOMEN AND PELVIS WITH CONTRAST TECHNIQUE: Multidetector CT imaging of the abdomen and pelvis was performed using the standard protocol following bolus administration of intravenous contrast. RADIATION DOSE REDUCTION: This exam was performed according to the departmental dose-optimization program which includes automated exposure control, adjustment of the mA and/or kV according to patient size and/or use of iterative reconstruction technique. CONTRAST:  OMNIPAQUE IOHEXOL 300 MG/ML  SOLN COMPARISON:  04/03/2022 FINDINGS: Lower chest: No acute abnormality. Hepatobiliary: 9 mm low-density lesion in the medial segment of the left hepatic lobe likely represents a benign cyst or hemangioma. This was previously evaluated with MRI abdomen 03/24/2021 and is not significantly changed given differences in technique. Unremarkable gallbladder and biliary tree. Pancreas: Unremarkable. Spleen: Unremarkable. Adrenals/Urinary Tract: Normal adrenal glands. No urinary calculi or hydronephrosis. Cortical renal scarring left kidney. Unremarkable bladder. Stomach/Bowel: Normal caliber large and small bowel. Within the colon at the splenic flexure there is a focal stool ball with mild adjacent stranding. This may be within normal limits however impacted stool or a bezoar could appear similarly (circa series 5/image 61). The appendix is not definitively visualized. No secondary signs of appendicitis. Stomach is within normal limits. Vascular/Lymphatic: No significant vascular findings are present. No enlarged abdominal or pelvic lymph nodes. Reproductive: IUD in  the uterus.  Bilateral adnexa are unremarkable. Other: No free intraperitoneal fluid or air. Musculoskeletal: No acute fracture. IMPRESSION: 1. Within the colon at the splenic flexure there is a focal stool ball with mild adjacent stranding. This could be normal however impacted stool or a bezoar could appear similarly. 2. The appendix is not definitively visualized. No secondary signs of appendicitis. Electronically Signed   By: Minerva Fester M.D.   On: 12/29/2022 20:14    Pertinent labs & imaging results that were available during my care of the patient were reviewed by me and considered in my medical decision making (see MDM for details).  Medications Ordered in ED Medications  morphine (PF) 4 MG/ML injection 4 mg (4 mg Intravenous Given 12/29/22 1856)  ondansetron (ZOFRAN) injection 4 mg (4 mg Intravenous Given 12/29/22 1852)  sodium chloride 0.9 % bolus 1,000 mL (0 mLs Intravenous Stopped 12/29/22 2023)  morphine (PF) 4 MG/ML injection 4 mg (4 mg Intravenous Given 12/29/22 1920)  ondansetron (ZOFRAN) injection 4 mg (4 mg Intravenous Given 12/29/22 1919)  iohexol (OMNIPAQUE) 300 MG/ML solution 100 mL (100 mLs Intravenous Contrast Given 12/29/22 1948)  oxyCODONE-acetaminophen (  PERCOCET/ROXICET) 5-325 MG per tablet 1 tablet (1 tablet Oral Given 12/29/22 2116)                                                                                                                                     Procedures Procedures  (including critical care time)  Medical Decision Making / ED Course   MDM:  37 year old female presenting to the emergency department with abdominal pain.  Examination notable for abdominal tenderness.  Patient well-appearing otherwise without fever or tachycardia.  Differential includes appendicitis, nephrolithiasis, urinary infection, less likely ovarian pathology given the initial location was periumbilical.  No suprapubic pain or tenderness to suggest PID.  Will check labs, obtain CT of  the abdomen and reassess.  Clinical Course as of 12/30/22 1142  Sat Dec 29, 2022  2020 CT ABDOMEN PELVIS W CONTRAST [WS]  2100 CT did not identify appendix but no surrounding inflammatory changes. Shows unusual finding of possible stool impaction at splenic flexure where patient has no abdominal tenderness and still having bowel movements. Discussed with Dr. Verdie Mosher who agrees with discharge, recommends bowel regimen for CT finding and strict return precautions if symptoms persist. No adnexal abnormality on CT scan. Discussed return for repeat abdominal exam if symptoms persist as early appendicitis is possible although unlikely. Will discharge patient to home. All questions answered. Patient comfortable with plan of discharge. Return precautions discussed with patient and specified on the after visit summary.   [WS]    Clinical Course User Index [WS] Lonell Grandchild, MD     Additional history obtained: -Additional history obtained from family    Lab Tests: -I ordered, reviewed, and interpreted labs.   The pertinent results include:   Labs Reviewed  COMPREHENSIVE METABOLIC PANEL - Abnormal; Notable for the following components:      Result Value   Sodium 133 (*)    AST 11 (*)    All other components within normal limits  CBC - Abnormal; Notable for the following components:   RBC 5.14 (*)    Hemoglobin 15.1 (*)    All other components within normal limits  URINALYSIS, ROUTINE W REFLEX MICROSCOPIC - Abnormal; Notable for the following components:   APPearance HAZY (*)    Hgb urine dipstick SMALL (*)    Ketones, ur 20 (*)    Protein, ur 30 (*)    All other components within normal limits  LIPASE, BLOOD  POC URINE PREG, ED    Notable for no leukocytosis, no sign of UTI   Imaging Studies ordered: I ordered imaging studies including CT abdomen On my interpretation imaging demonstrates no appendicitis findings I independently visualized and interpreted imaging. I agree  with the radiologist interpretation   Medicines ordered and prescription drug management: Meds ordered this encounter  Medications   DISCONTD: ondansetron (ZOFRAN) injection 4 mg   morphine (PF) 4 MG/ML injection 4 mg   ondansetron (ZOFRAN) injection 4  mg   sodium chloride 0.9 % bolus 1,000 mL   morphine (PF) 4 MG/ML injection 4 mg   ondansetron (ZOFRAN) injection 4 mg   iohexol (OMNIPAQUE) 300 MG/ML solution 100 mL   ondansetron (ZOFRAN) 4 MG tablet    Sig: Take 1 tablet (4 mg total) by mouth every 8 (eight) hours as needed for nausea or vomiting.    Dispense:  12 tablet    Refill:  0   ibuprofen (ADVIL) 600 MG tablet    Sig: Take 1 tablet (600 mg total) by mouth every 6 (six) hours as needed.    Dispense:  30 tablet    Refill:  0   polyethylene glycol powder (GLYCOLAX/MIRALAX) 17 GM/SCOOP powder    Sig: Take 17 g by mouth daily.    Dispense:  255 g    Refill:  0   oxyCODONE-acetaminophen (PERCOCET/ROXICET) 5-325 MG per tablet 1 tablet    -I have reviewed the patients home medicines and have made adjustments as needed   Consultations Obtained: I requested consultation with the general surgeon,  and discussed lab and imaging findings as well as pertinent plan - they recommend: discharge, strict return precautions   Cardiac Monitoring: The patient was maintained on a cardiac monitor.  I personally viewed and interpreted the cardiac monitored which showed an underlying rhythm of: NSR    Reevaluation: After the interventions noted above, I reevaluated the patient and found that their symptoms have improved  Co morbidities that complicate the patient evaluation  Past Medical History:  Diagnosis Date   Bilateral kidney stones    Headache    History of kidney stones    Migraine    Pyelonephritis 04/17/2016      Dispostion: Disposition decision including need for hospitalization was considered, and patient discharged from emergency department.    Final Clinical  Impression(s) / ED Diagnoses Final diagnoses:  Right lower quadrant abdominal pain     This chart was dictated using voice recognition software.  Despite best efforts to proofread,  errors can occur which can change the documentation meaning.    Lonell Grandchild, MD 12/30/22 [phone]

## 2022-12-29 NOTE — ED Triage Notes (Signed)
Pt presents with periumbilical pain that started this AM. Pain has now localized to the RLQ. Pt reports associated N/V.

## 2022-12-29 NOTE — Discharge Instructions (Addendum)
We evaluated you for your abdominal pain.  Your CT scan showed some stool stuck in your intestines which may have been contributing to your pain.  We did not see any sign of appendicitis. It is unlikely, but it is still possible that you have early appendicitis.    We have started you on a bowel regimen to help with the stool to see if that helps with your pain.  Please take MiraLAX daily. Please take Tylenol and Motrin for your pain at home.  You can take 1000 mg of Tylenol every 6 hours and 600 mg of ibuprofen every 6 hours as needed for your symptoms.  You can take these medicines together as needed, either at the same time, or alternating every 3 hours.  I have also sent you a prescription for Zofran, antinausea medicine you can take every 8 hours as needed for nausea.  Please keep a close eye on your symptoms.  If your symptoms worsen I would like you to return to the emergency department so we can do a repeat abdominal exam and repeat your laboratory testing.  Please also return if you have any uncontrolled vomiting, high fevers, bloody stools, black stools, or any other concerning symptoms.  If your symptoms improve, please follow-up closely with your primary physician sometime next week.

## 2022-12-29 NOTE — ED Notes (Signed)
Patient transported to CT 

## 2022-12-29 NOTE — ED Notes (Signed)
Patient returned from CT tx room.

## 2022-12-31 DIAGNOSIS — K529 Noninfective gastroenteritis and colitis, unspecified: Secondary | ICD-10-CM | POA: Insufficient documentation

## 2022-12-31 DIAGNOSIS — K5901 Slow transit constipation: Secondary | ICD-10-CM | POA: Insufficient documentation

## 2022-12-31 DIAGNOSIS — R1084 Generalized abdominal pain: Secondary | ICD-10-CM | POA: Insufficient documentation

## 2023-01-05 IMAGING — US US OB COMP +14 WK
1 series · 13 of 28 positions shown · non-contrast
Comparison: none

CLINICAL DATA: Pregnancy.  Assess fetal anatomy

EXAM:
OBSTETRICAL ULTRASOUND >14 WKS

[Series 1: us ob comp + 14 wk · 13 of 74 slices shown]
[im 3/74]
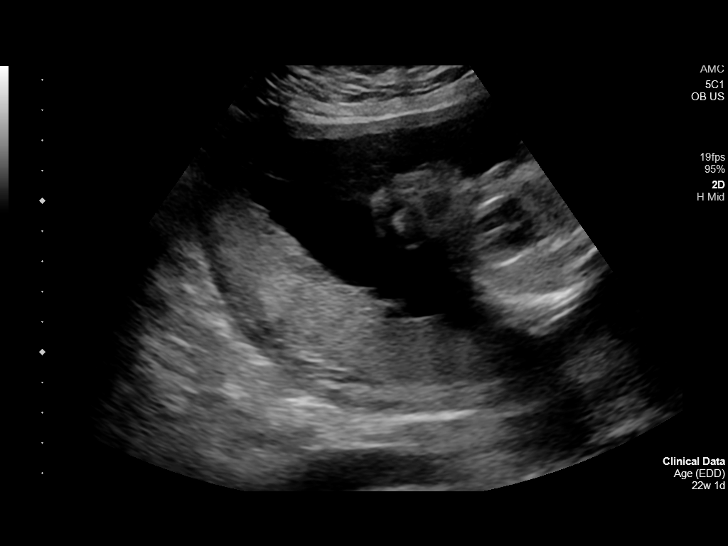
[im 9/74]
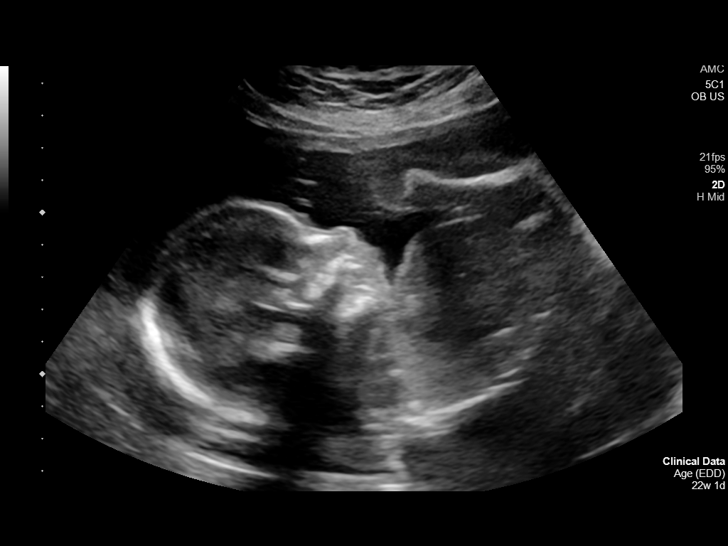
[im 14/74]
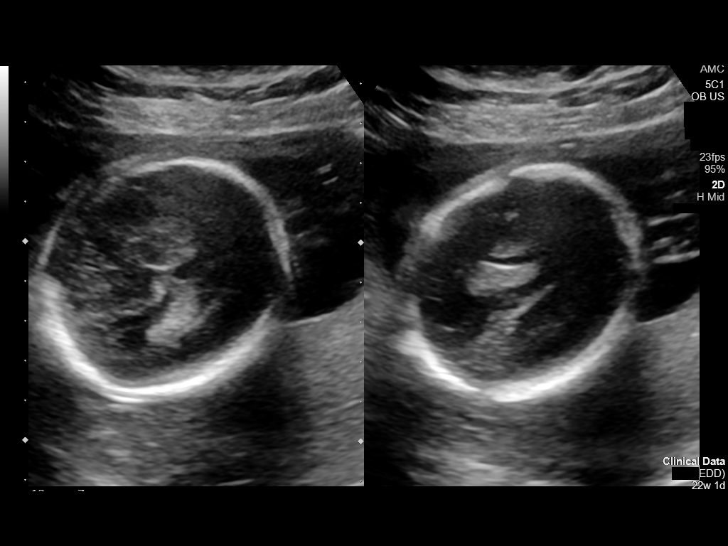
[im 19/74]
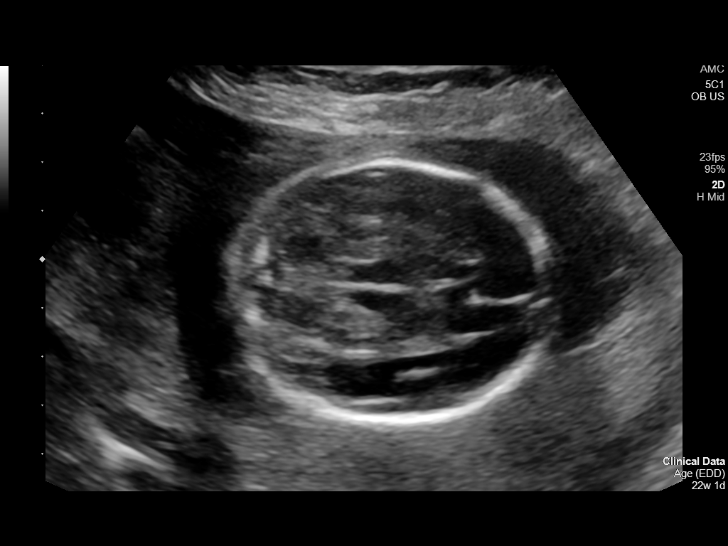
[im 25/74]
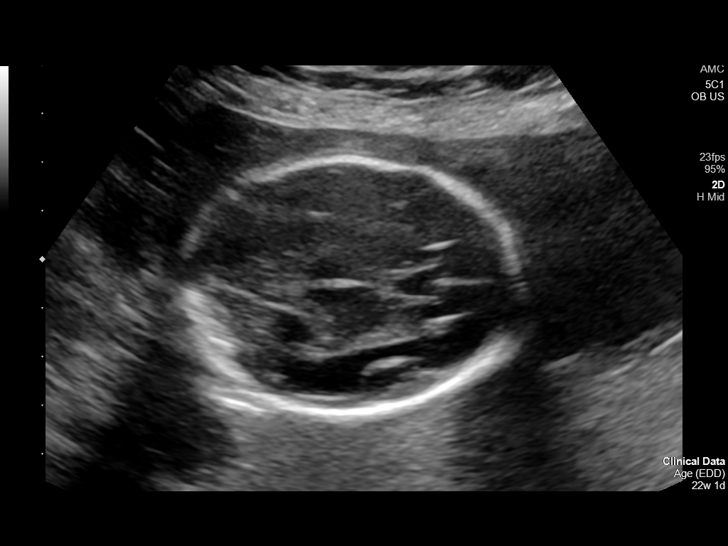
[im 30/74]
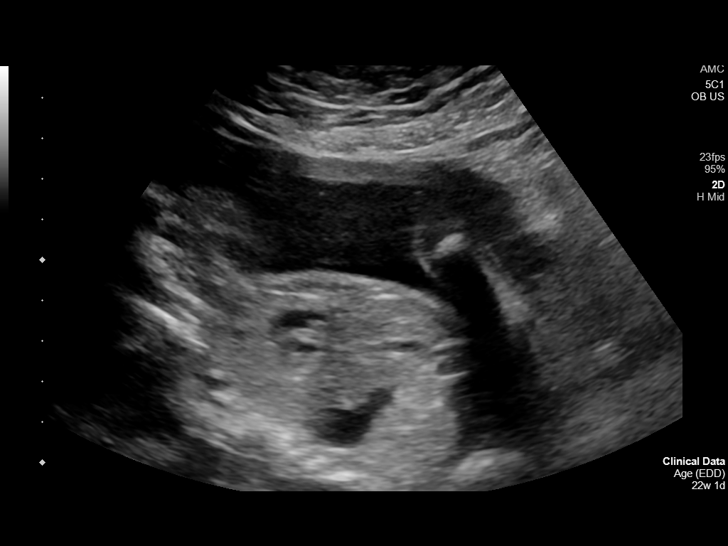
[im 38/74]
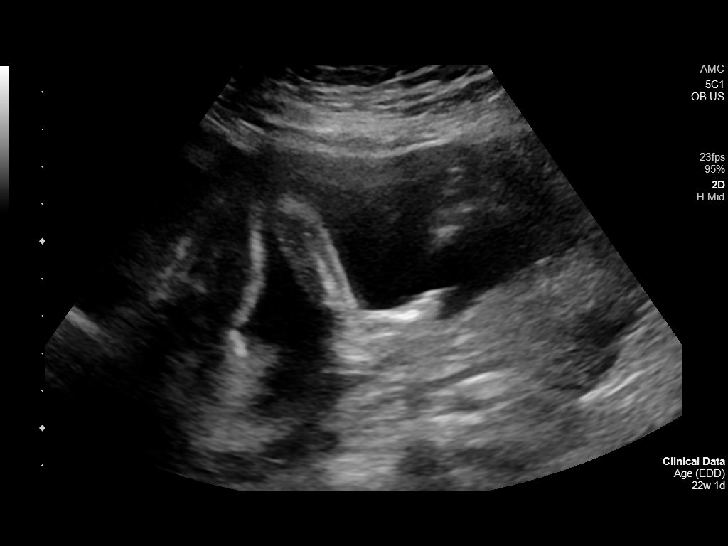
[im 44/74]
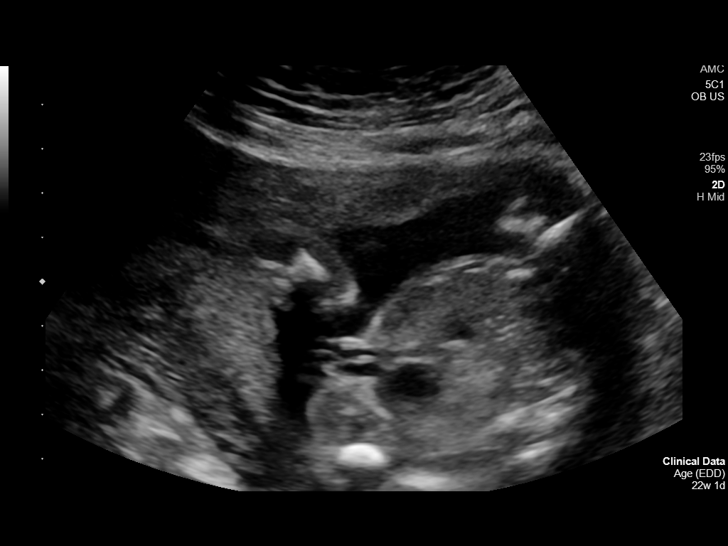
[im 49/74]
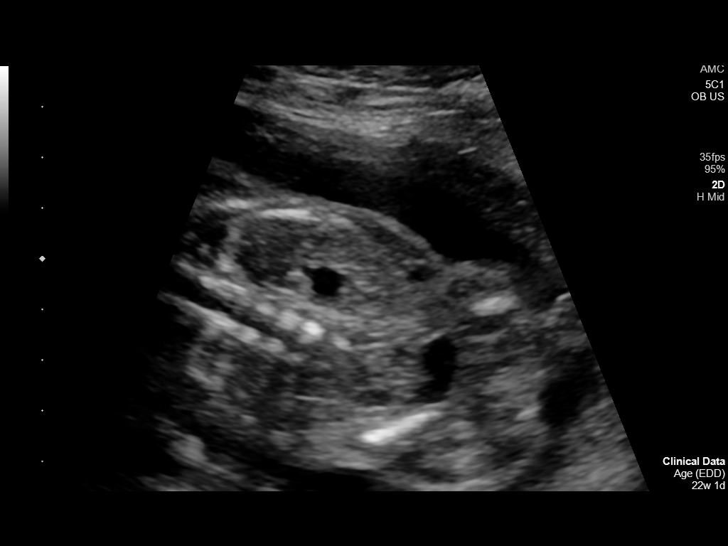
[im 55/74]
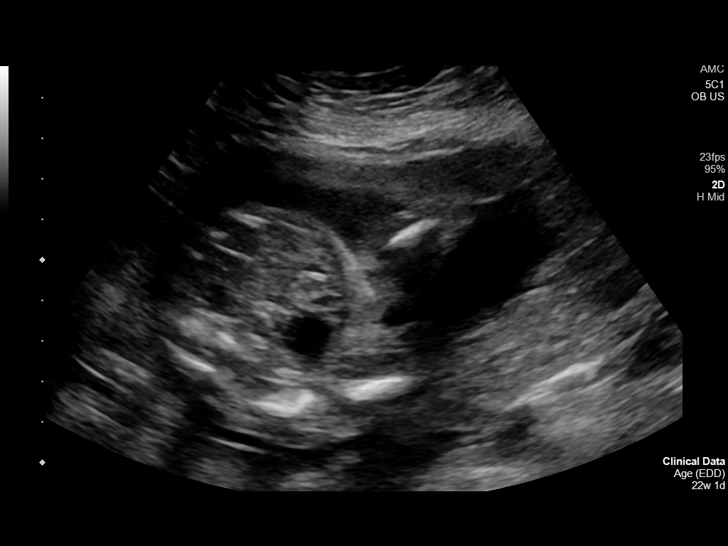
[im 60/74]
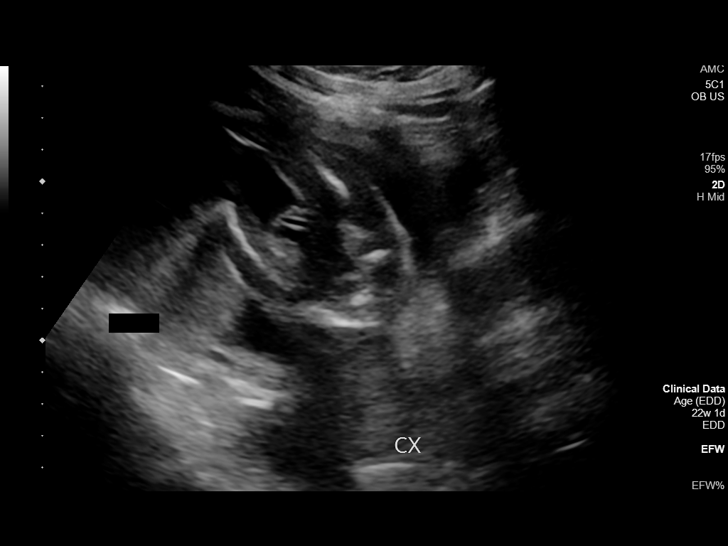
[im 65/74]
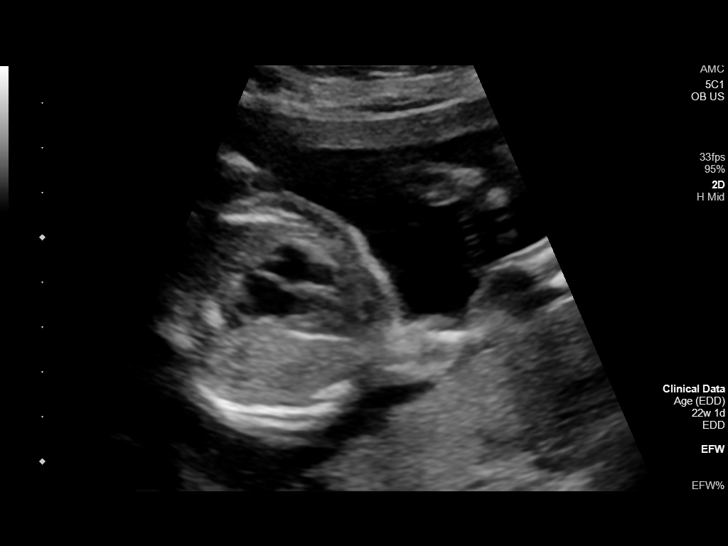
[im 71/74]
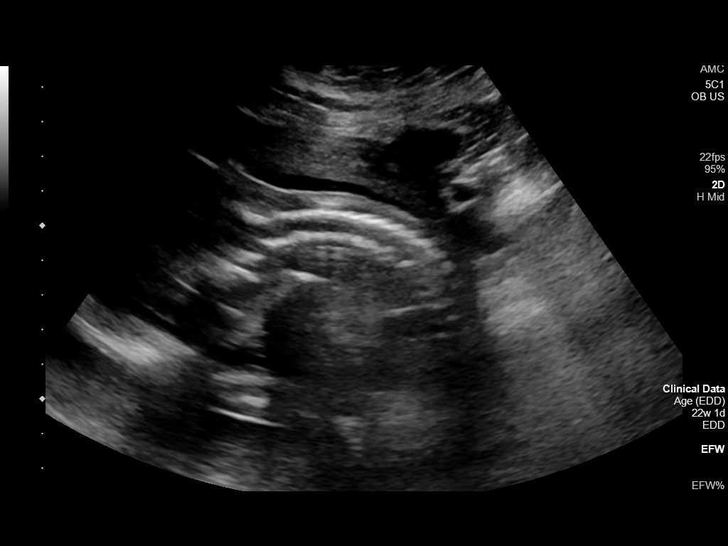

[13 of 28 positions shown; findings below may reference images not displayed]

FINDINGS: Number of Fetuses: 1

Heart Rate:  121 bpm

Movement: Yes

Presentation: Breech

Previa: No

Placental Location: Posterior

Amniotic Fluid (Subjective): Normal

Amniotic Fluid (Objective):

Vertical pocket = 4.2cm

FETAL BIOMETRY

BPD: 5.3cm 22w 0d

HC:   19.4cm 21w 4d

AC:   17.1cm 22w 1d

FL:   3.8cm 22w 1d

Current Mean GA: 22w 0d US EDC: 07/06/2021

Assigned GA:  22w 1d Assigned EDC: 07/05/2021

Estimated Fetal Weight:  472g 39%ile

FETAL ANATOMY

Lateral Ventricles: Appears normal

Thalami/CSP: Appears normal

Posterior Fossa:  Appears normal

Nuchal Region: Appears normal   NFT= 3 mm

Upper Lip: Appears normal

Spine: Not well visualized

4 Chamber Heart on Left: Appears normal

LVOT: Appears normal

RVOT: Not visualized

Stomach on Left: Appears normal

3 Vessel Cord: Appears normal

Cord Insertion site: Appears normal

Kidneys: Right renal pelvis measures 4.1 mm in diameter. Left renal
pelvis measures 2.3 mm in diameter. Kidneys appear otherwise within
normal limits.

Bladder: Appears normal

Extremities: Appears normal

Technically difficult due to: Fetal positioning

Maternal Findings:

Cervix:  Closed.  3.5 cm.
IMPRESSION: 1. Single live intrauterine gestation in breech presentation.
2. Assigned gestational age of 22 weeks 1 day. Adequate interval
growth.
3. Limited visualization of the fetal spine and right ventricular
outflow tract. Attention on follow-up.
4. Right renal pelvis diameter of 4.1 mm, upper limits of normal.
Attention on follow-up of this finding is also suggested.
5. Remainder of fetal anatomic survey is within normal limits.

## 2023-01-07 IMAGING — US US RENAL
1 series · 14 of 24 positions shown · non-contrast
Comparison: None.

CLINICAL DATA: LEFT flank pain. History of hydronephrosis. History
of stent. Twenty-two weeks pregnant.

EXAM:
RENAL / URINARY TRACT ULTRASOUND COMPLETE

[Series 1: us renal · 14 of 24 slices shown]
[im 1/24]
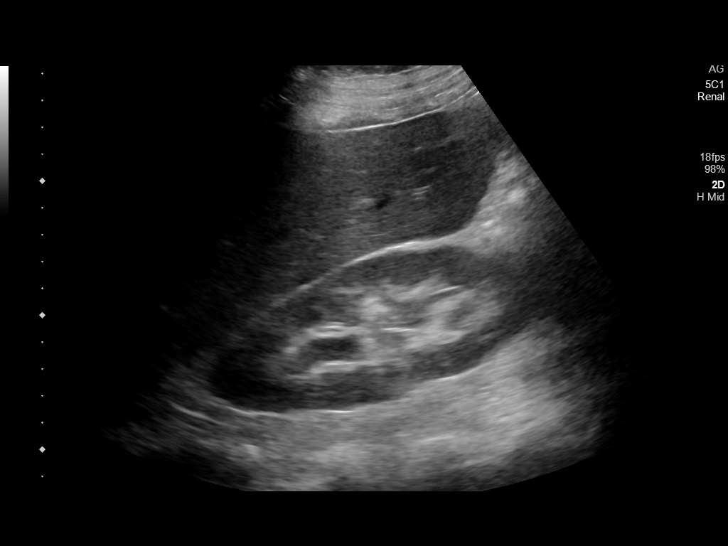
[im 3/24]
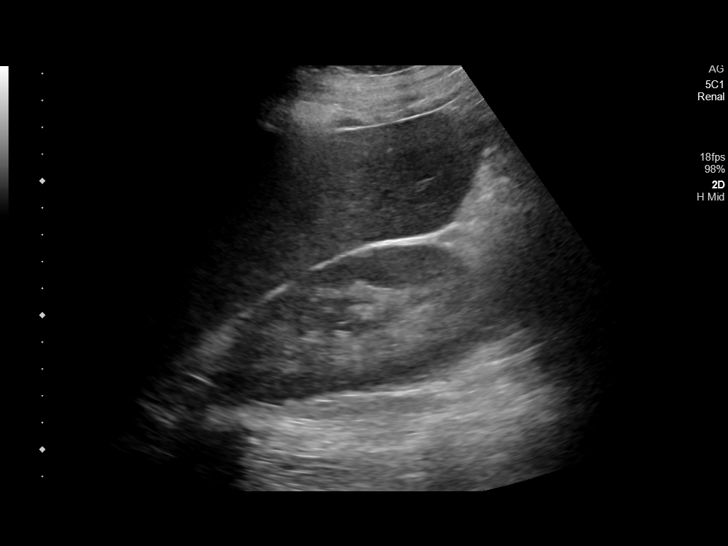
[im 5/24]
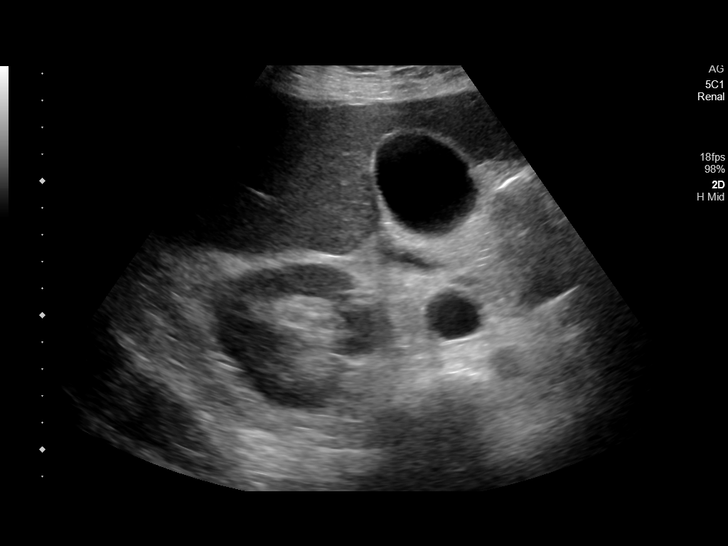
[im 7/24]
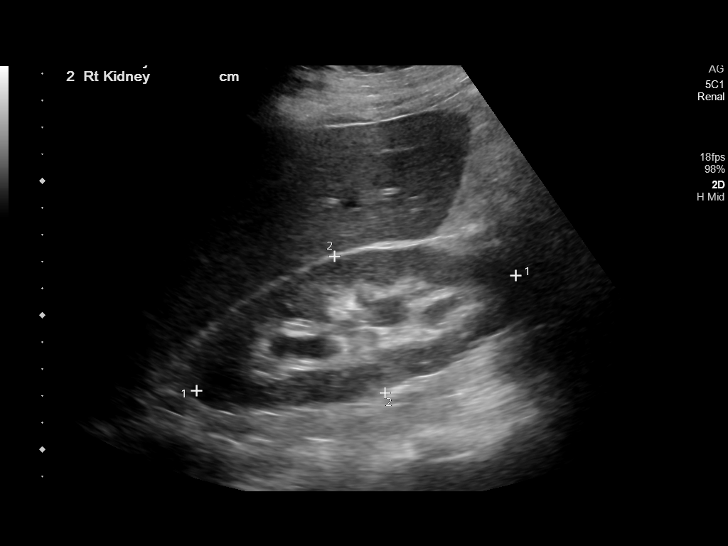
[im 8/24]
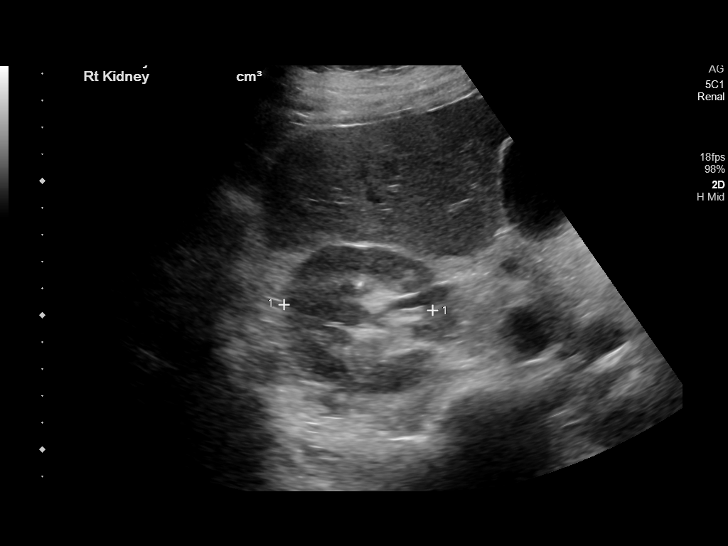
[im 10/24]
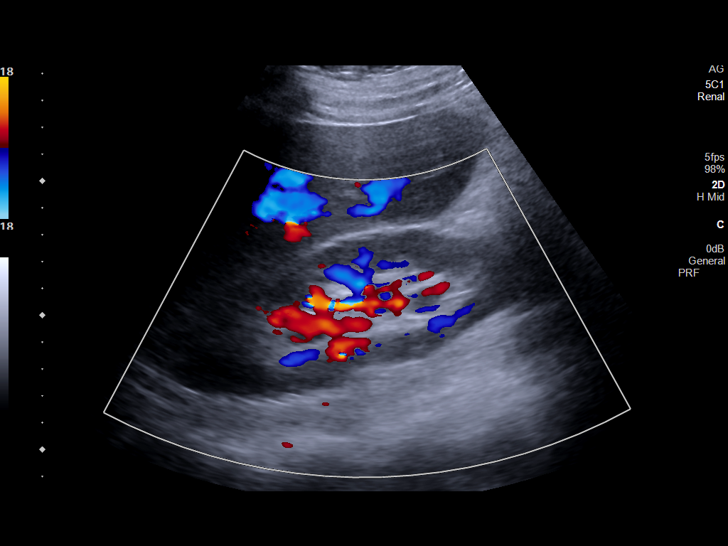
[im 12/24]
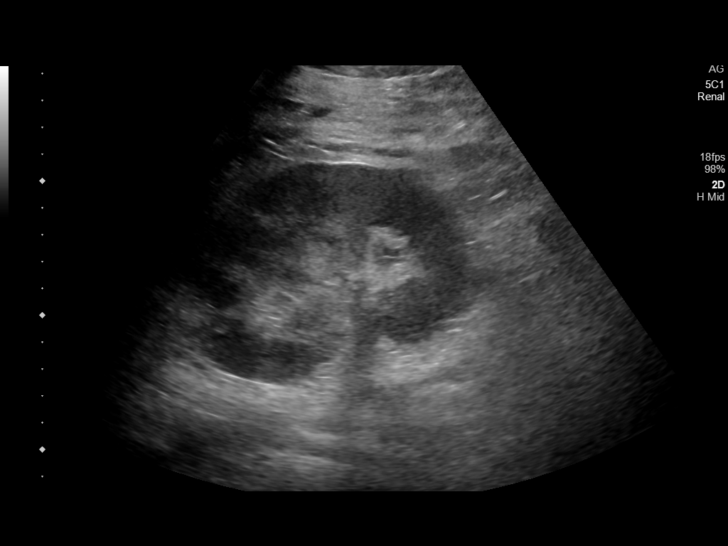
[im 13/24]
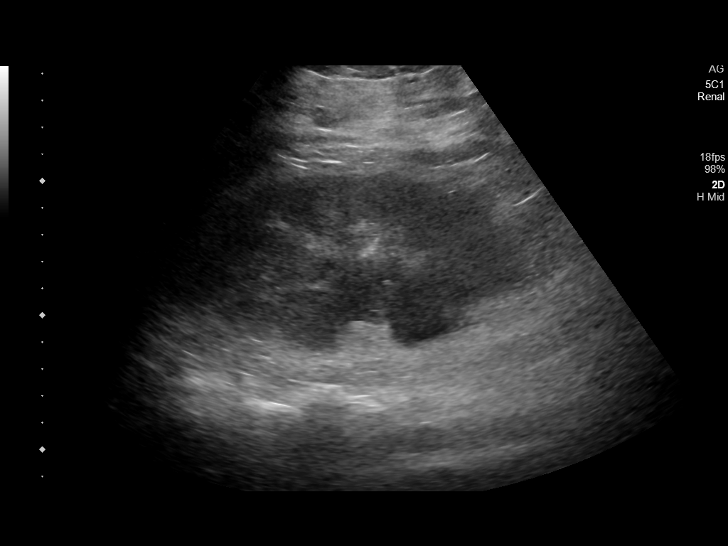
[im 15/24]
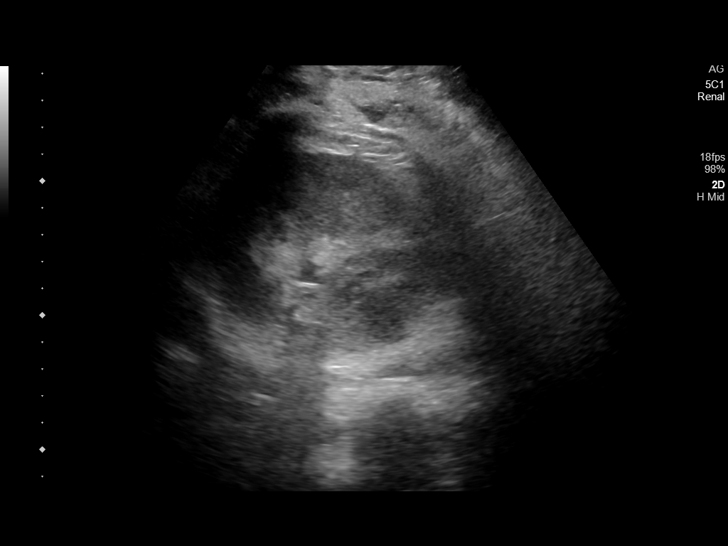
[im 17/24]
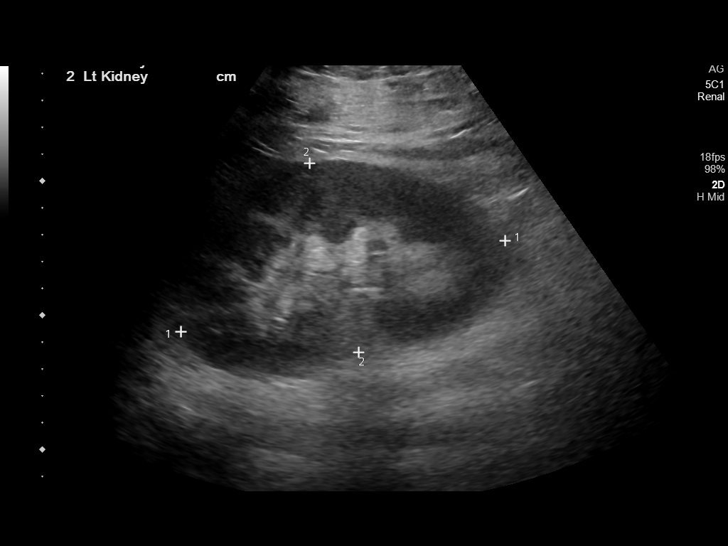
[im 19/24]
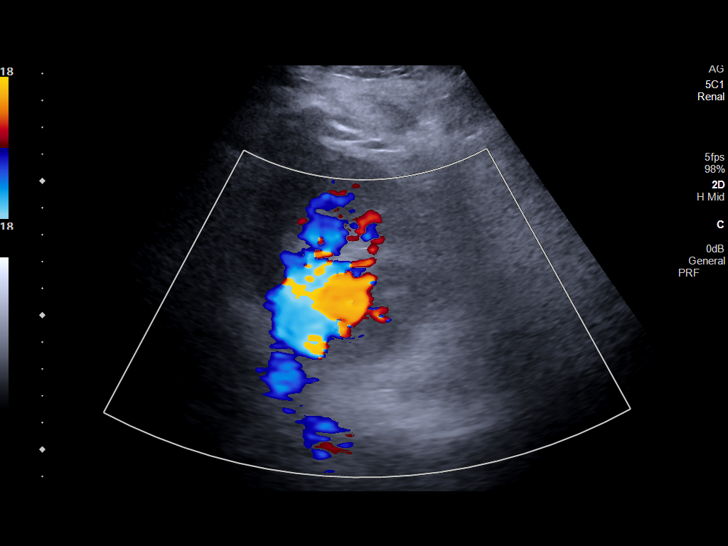
[im 20/24]
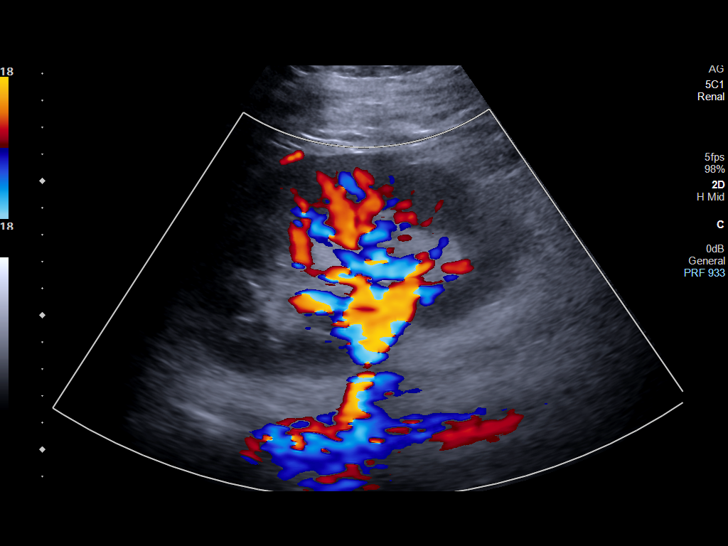
[im 22/24]
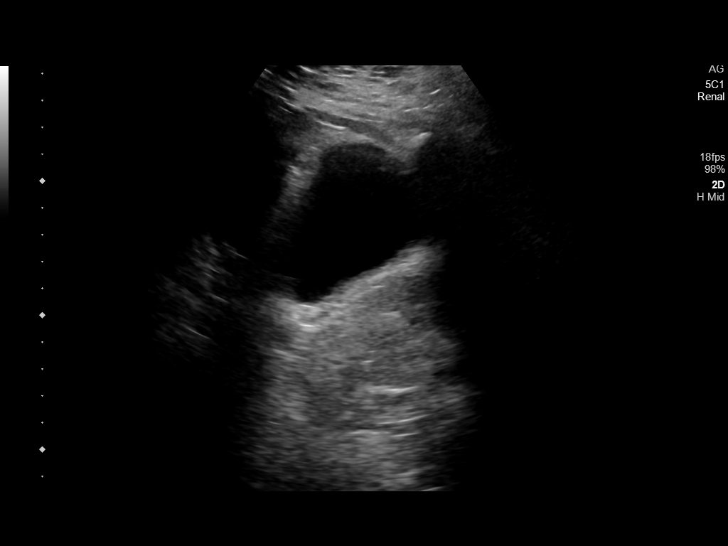
[im 24/24]
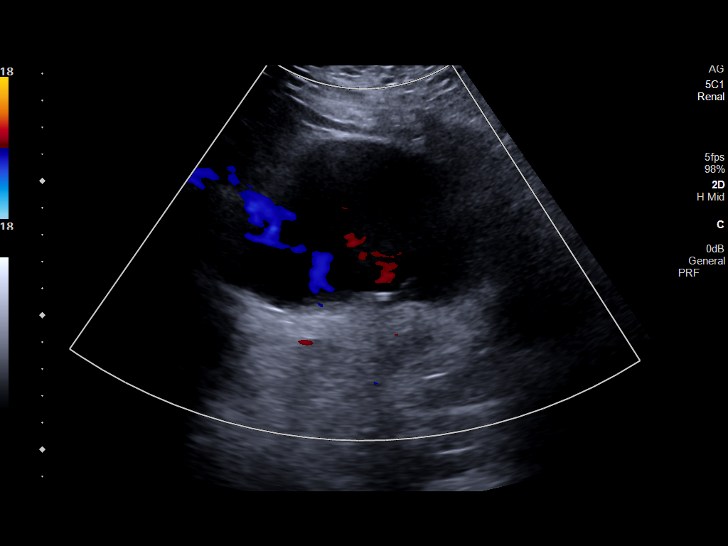

[14 of 24 positions shown; findings below may reference images not displayed]

FINDINGS: Right Kidney:

Renal measurements: 12.6 x 5.4 x 5.5 cm = volume: 199 mL.
Echogenicity within normal limits. No mass or hydronephrosis
visualized.

Left Kidney:

Renal measurements: 12.5 x 7.3 x 6.2 cm = volume: 297 mL.
Echogenicity within normal limits. No mass or hydronephrosis
visualized. Stent seen within the LEFT renal pelvis.

Bladder:

Appears normal for degree of bladder distention. Distal portion of
the LEFT nephroureteral stent is seen within the bladder.

Other:

None.
IMPRESSION: 1. No acute findings.  No hydronephrosis bilaterally.
2. LEFT-sided nephroureteral stent appears appropriately positioned.

## 2023-01-15 DIAGNOSIS — Z5329 Procedure and treatment not carried out because of patient's decision for other reasons: Secondary | ICD-10-CM | POA: Diagnosis not present

## 2023-01-15 DIAGNOSIS — R112 Nausea with vomiting, unspecified: Secondary | ICD-10-CM | POA: Insufficient documentation

## 2023-01-15 DIAGNOSIS — R1030 Lower abdominal pain, unspecified: Secondary | ICD-10-CM | POA: Diagnosis present

## 2023-01-16 ENCOUNTER — Emergency Department (HOSPITAL_COMMUNITY): Payer: Medicaid Other

## 2023-01-16 ENCOUNTER — Emergency Department (HOSPITAL_COMMUNITY)
Admission: EM | Admit: 2023-01-16 | Discharge: 2023-01-16 | Discharge status: Against Medical Advice | Payer: Medicaid Other | Attending: Emergency Medicine | Admitting: Emergency Medicine

## 2023-01-16 ENCOUNTER — Other Ambulatory Visit: Payer: Self-pay

## 2023-01-16 DIAGNOSIS — Z5321 Procedure and treatment not carried out due to patient leaving prior to being seen by health care provider: Secondary | ICD-10-CM

## 2023-01-16 LAB — URINALYSIS, ROUTINE W REFLEX MICROSCOPIC
Bilirubin Urine: NEGATIVE
Glucose, UA: NEGATIVE mg/dL
Hgb urine dipstick: NEGATIVE
Ketones, ur: NEGATIVE mg/dL
Nitrite: NEGATIVE
Protein, ur: 30 mg/dL — AB
Specific Gravity, Urine: 1.011 (ref 1.005–1.030)
pH: 7 (ref 5.0–8.0)

## 2023-01-16 LAB — POC URINE PREG, ED: Preg Test, Ur: NEGATIVE

## 2023-01-16 MED ORDER — OXYCODONE-ACETAMINOPHEN 5-325 MG PO TABS
2.0000 | ORAL_TABLET | Freq: Once | ORAL | Status: AC
  Administered 2023-01-16: 2 via ORAL
  Filled 2023-01-16: qty 2

## 2023-01-16 MED ORDER — ONDANSETRON 8 MG PO TBDP
8.0000 mg | ORAL_TABLET | Freq: Once | ORAL | Status: AC
  Administered 2023-01-16: 8 mg via ORAL
  Filled 2023-01-16: qty 1

## 2023-01-16 NOTE — ED Provider Notes (Signed)
[place] EMERGENCY DEPARTMENT AT [facility] Provider Note   CSN: [id number] Arrival date & time: 01/15/23  2358     History  Chief Complaint  Patient presents with   Flank Pain    Laura Sosa is a 37 y.o. female.  37 year old female who presents ER today with left-sided back pain that radiates towards her groin.  States it feels like previous kidney stones.  Has not noticed any blood in her urine.  Does state that it has a foul smell.  Has had multiple episodes of nausea and vomiting.  No fevers.  No other associated symptoms.  On review of the records it appears that she has been seen multiple times at multiple ERs in the last couple weeks for abdominal type symptoms.  Was found to have constipation and colitis which appears to have been treated.  Review of opiate prescribing shows multiple providers multiple opiates over the last year.   Flank Pain       Home Medications Prior to Admission medications   Medication Sig Start Date End Date Taking? Authorizing Provider  ibuprofen (ADVIL) 600 MG tablet Take 1 tablet (600 mg total) by mouth every 6 (six) hours as needed. 12/29/22   Lonell Grandchild, MD  ondansetron (ZOFRAN) 4 MG tablet Take 1 tablet (4 mg total) by mouth every 8 (eight) hours as needed for nausea or vomiting. 12/29/22   Lonell Grandchild, MD  polyethylene glycol powder (GLYCOLAX/MIRALAX) 17 GM/SCOOP powder Take 17 g by mouth daily. 12/29/22   Lonell Grandchild, MD  Prenatal Vit-Fe Fumarate-FA (MULTIVITAMIN-PRENATAL) 27-0.8 MG TABS tablet Take 1 tablet by mouth daily at 12 noon.    [provider]  sertraline (ZOLOFT) 50 MG tablet Take 1 tablet (50 mg total) by mouth daily. 09/21/21   Linzie Collin, MD      Allergies    Patient has no known allergies.    Review of Systems   Review of Systems  Genitourinary:  Positive for flank pain.    Physical Exam Updated Vital Signs BP 122/60   Pulse 74   Temp 97.8 F (36.6 C) (Oral)    Resp (!) 22   Ht 5\' 3"  (1.6 m)   Wt 67 kg   SpO2 97%   BMI 26.17 kg/m  Physical Exam Vitals and nursing note reviewed.  Constitutional:      Appearance: She is well-developed.  HENT:     Head: Normocephalic and atraumatic.  Eyes:     Pupils: Pupils are equal, round, and reactive to light.  Cardiovascular:     Rate and Rhythm: Normal rate and regular rhythm.  Pulmonary:     Effort: No respiratory distress.     Breath sounds: No stridor.  Abdominal:     General: Abdomen is flat. There is no distension.  Musculoskeletal:        General: No swelling. Normal range of motion.     Cervical back: Normal range of motion.  Skin:    General: Skin is warm and dry.  Neurological:     General: No focal deficit present.     Mental Status: She is alert.     ED Results / Procedures / Treatments   Labs (all labs ordered are listed, but only abnormal results are displayed) Labs Reviewed  URINALYSIS, ROUTINE W REFLEX MICROSCOPIC - Abnormal; Notable for the following components:      Result Value   Protein, ur 30 (*)    Leukocytes,Ua TRACE (*)  Bacteria, UA FEW (*)    All other components within normal limits  POC URINE PREG, ED    EKG None  Radiology CT Renal Stone Study  Result Date: 01/16/2023 CLINICAL DATA:  Left flank pain, history of kidney stones. EXAM: CT ABDOMEN AND PELVIS WITHOUT CONTRAST TECHNIQUE: Multidetector CT imaging of the abdomen and pelvis was performed following the standard protocol without IV contrast. RADIATION DOSE REDUCTION: This exam was performed according to the departmental dose-optimization program which includes automated exposure control, adjustment of the mA and/or kV according to patient size and/or use of iterative reconstruction technique. COMPARISON:  11/29/2022. FINDINGS: Lower chest: No acute abnormality. Hepatobiliary: No focal liver abnormality is seen. No gallstones, gallbladder wall thickening, or biliary dilatation. Pancreas:  Unremarkable. No pancreatic ductal dilatation or surrounding inflammatory changes. Spleen: Normal in size without focal abnormality. Adrenals/Urinary Tract: The adrenal glands are within normal limits. Nonobstructive renal calculi are present bilaterally. There is renal cortical scarring on the left. No ureteral calculus or obstructive uropathy bilaterally. The bladder is unremarkable. Stomach/Bowel: Stomach is within normal limits. Appendix appears normal. No evidence of bowel wall thickening, distention, or inflammatory changes. No free air or pneumatosis. Vascular/Lymphatic: No significant vascular findings are present. No enlarged abdominal or pelvic lymph nodes. Reproductive: An IUD is present in the uterus. Cysts are present in the ovaries bilaterally measuring up to 2 cm on the left, likely dominant follicles. Other: A trace amount of free fluid is noted in the cul-de-sac. Musculoskeletal: No acute osseous abnormality. IMPRESSION: Bilateral nephrolithiasis with no evidence of obstructive uropathy bilaterally. Electronically Signed   By: Thornell Sartorius M.D.   On: 01/16/2023 03:28    Procedures Procedures    Medications Ordered in ED Medications  oxyCODONE-acetaminophen (PERCOCET/ROXICET) 5-325 MG per tablet 2 tablet (2 tablets Oral Given 01/16/23 0330)  ondansetron (ZOFRAN-ODT) disintegrating tablet 8 mg (8 mg Oral Given 01/16/23 0330)    ED Course/ Medical Decision Making/ A&P                             Medical Decision Making Amount and/or Complexity of Data Reviewed Labs: ordered. Radiology: ordered.  Risk Prescription drug management.   Urine without hematuria making kidney stone unlikely.  Will get a CT scan to ensure as not something else and/or any obstructive uropathy.  No evidence of infection.  Pain meds and nausea meds ordered.  For nursing patient is refusing oral medication and is demanding IV medication.  This is obviously red flag behavior.  Review of her CT scan there  is no kidney stone or other intra-abdominal process to be treating with IV narcotics.  I was going to go discussed this with her however was told by nursing that patient left that she could not get IV pain medication.  I think this is okay as labs and imaging were reassuring.  I would be very hesitant to give this patient narcotics without a demonstrable cause for her pain in the future.   Final Clinical Impression(s) / ED Diagnoses Final diagnoses:  Eloped from emergency department    Rx / DC Orders ED Discharge Orders     None         Kenyata Napier, Barbara Cower, MD 01/16/23 [phone]

## 2023-01-16 NOTE — ED Notes (Signed)
Pt stated that since she is not getting an IV with IV pain meds then she wants to leave. Pt ambulatory down the hall.

## 2023-01-16 NOTE — ED Triage Notes (Signed)
Pt presents with left flank pain with N&V. States she has hx of kidney stones. Reports foul smelling urine and abnormal discharge.
